# Patient Record
Sex: Female | Born: 1950
Health system: Southern US, Community
[De-identification: ages and names within clinical notes are randomized; demographics above are authoritative.]

## PROBLEM LIST (undated history)

## (undated) DIAGNOSIS — F329 Major depressive disorder, single episode, unspecified: Secondary | ICD-10-CM

## (undated) DIAGNOSIS — C541 Malignant neoplasm of endometrium: Secondary | ICD-10-CM

## (undated) DIAGNOSIS — E119 Type 2 diabetes mellitus without complications: Secondary | ICD-10-CM

## (undated) DIAGNOSIS — D126 Benign neoplasm of colon, unspecified: Secondary | ICD-10-CM

## (undated) DIAGNOSIS — L309 Dermatitis, unspecified: Secondary | ICD-10-CM

## (undated) DIAGNOSIS — E041 Nontoxic single thyroid nodule: Secondary | ICD-10-CM

## (undated) DIAGNOSIS — C649 Malignant neoplasm of unspecified kidney, except renal pelvis: Secondary | ICD-10-CM

## (undated) DIAGNOSIS — F32A Depression, unspecified: Secondary | ICD-10-CM

## (undated) DIAGNOSIS — E669 Obesity, unspecified: Secondary | ICD-10-CM

## (undated) DIAGNOSIS — K602 Anal fissure, unspecified: Secondary | ICD-10-CM

## (undated) DIAGNOSIS — M1990 Unspecified osteoarthritis, unspecified site: Secondary | ICD-10-CM

## (undated) DIAGNOSIS — K5790 Diverticulosis of intestine, part unspecified, without perforation or abscess without bleeding: Secondary | ICD-10-CM

## (undated) DIAGNOSIS — I10 Essential (primary) hypertension: Secondary | ICD-10-CM

## (undated) DIAGNOSIS — F419 Anxiety disorder, unspecified: Secondary | ICD-10-CM

## (undated) DIAGNOSIS — R739 Hyperglycemia, unspecified: Secondary | ICD-10-CM

## (undated) DIAGNOSIS — E079 Disorder of thyroid, unspecified: Secondary | ICD-10-CM

## (undated) DIAGNOSIS — N189 Chronic kidney disease, unspecified: Secondary | ICD-10-CM

## (undated) DIAGNOSIS — E039 Hypothyroidism, unspecified: Secondary | ICD-10-CM

## (undated) DIAGNOSIS — I499 Cardiac arrhythmia, unspecified: Secondary | ICD-10-CM

## (undated) DIAGNOSIS — L723 Sebaceous cyst: Secondary | ICD-10-CM

## (undated) HISTORY — DX: Major depressive disorder, single episode, unspecified: F32.9

## (undated) HISTORY — DX: Benign neoplasm of colon, unspecified: D12.6

## (undated) HISTORY — DX: Nontoxic single thyroid nodule: E04.1

## (undated) HISTORY — DX: Unspecified osteoarthritis, unspecified site: M19.90

## (undated) HISTORY — DX: Dermatitis, unspecified: L30.9

## (undated) HISTORY — DX: Sebaceous cyst: L72.3

## (undated) HISTORY — DX: Essential (primary) hypertension: I10

## (undated) HISTORY — DX: Disorder of thyroid, unspecified: E07.9

## (undated) HISTORY — DX: Anal fissure, unspecified: K60.2

## (undated) HISTORY — DX: Hyperglycemia, unspecified: R73.9

## (undated) HISTORY — DX: Depression, unspecified: F32.A

## (undated) HISTORY — PX: WISDOM TOOTH EXTRACTION: SHX21

## (undated) HISTORY — DX: Diverticulosis of intestine, part unspecified, without perforation or abscess without bleeding: K57.90

## (undated) HISTORY — DX: Malignant neoplasm of endometrium: C54.1

## (undated) HISTORY — DX: Obesity, unspecified: E66.9

---

## 1998-01-04 ENCOUNTER — Other Ambulatory Visit: Admission: RE | Admit: 1998-01-04 | Discharge: 1998-01-04 | Payer: Self-pay | Admitting: *Deleted

## 1998-04-24 ENCOUNTER — Other Ambulatory Visit: Admission: RE | Admit: 1998-04-24 | Discharge: 1998-04-24 | Payer: Self-pay | Admitting: *Deleted

## 1998-06-04 ENCOUNTER — Ambulatory Visit (HOSPITAL_COMMUNITY): Admission: RE | Admit: 1998-06-04 | Discharge: 1998-06-04 | Payer: Self-pay | Admitting: *Deleted

## 1998-06-18 ENCOUNTER — Other Ambulatory Visit: Admission: RE | Admit: 1998-06-18 | Discharge: 1998-06-18 | Payer: Self-pay | Admitting: *Deleted

## 1998-07-15 ENCOUNTER — Encounter (INDEPENDENT_AMBULATORY_CARE_PROVIDER_SITE_OTHER): Payer: Self-pay | Admitting: Specialist

## 1998-07-15 ENCOUNTER — Inpatient Hospital Stay (HOSPITAL_COMMUNITY): Admission: RE | Admit: 1998-07-15 | Discharge: 1998-07-18 | Payer: Self-pay | Admitting: *Deleted

## 1999-01-20 DIAGNOSIS — C541 Malignant neoplasm of endometrium: Secondary | ICD-10-CM

## 1999-01-20 HISTORY — DX: Malignant neoplasm of endometrium: C54.1

## 1999-01-20 HISTORY — PX: TOTAL ABDOMINAL HYSTERECTOMY: SHX209

## 1999-06-27 ENCOUNTER — Other Ambulatory Visit: Admission: RE | Admit: 1999-06-27 | Discharge: 1999-06-27 | Payer: Self-pay | Admitting: *Deleted

## 1999-10-17 ENCOUNTER — Encounter: Admission: RE | Admit: 1999-10-17 | Discharge: 1999-10-17 | Payer: Self-pay | Admitting: Internal Medicine

## 1999-10-17 ENCOUNTER — Encounter: Payer: Self-pay | Admitting: Internal Medicine

## 1999-11-11 ENCOUNTER — Other Ambulatory Visit: Admission: RE | Admit: 1999-11-11 | Discharge: 1999-11-11 | Payer: Self-pay | Admitting: *Deleted

## 2000-05-28 ENCOUNTER — Encounter (HOSPITAL_COMMUNITY): Admission: RE | Admit: 2000-05-28 | Discharge: 2000-06-27 | Payer: Self-pay | Admitting: Neurosurgery

## 2000-07-09 ENCOUNTER — Other Ambulatory Visit: Admission: RE | Admit: 2000-07-09 | Discharge: 2000-07-09 | Payer: Self-pay | Admitting: *Deleted

## 2001-04-22 ENCOUNTER — Other Ambulatory Visit: Admission: RE | Admit: 2001-04-22 | Discharge: 2001-04-22 | Payer: Self-pay | Admitting: *Deleted

## 2001-12-30 ENCOUNTER — Other Ambulatory Visit: Admission: RE | Admit: 2001-12-30 | Discharge: 2001-12-30 | Payer: Self-pay | Admitting: *Deleted

## 2002-03-03 ENCOUNTER — Encounter: Payer: Self-pay | Admitting: Internal Medicine

## 2002-03-03 ENCOUNTER — Encounter: Admission: RE | Admit: 2002-03-03 | Discharge: 2002-03-03 | Payer: Self-pay | Admitting: Internal Medicine

## 2002-03-17 ENCOUNTER — Ambulatory Visit (HOSPITAL_COMMUNITY): Admission: RE | Admit: 2002-03-17 | Discharge: 2002-03-17 | Payer: Self-pay | Admitting: *Deleted

## 2002-03-17 ENCOUNTER — Encounter (INDEPENDENT_AMBULATORY_CARE_PROVIDER_SITE_OTHER): Payer: Self-pay | Admitting: Specialist

## 2002-04-26 ENCOUNTER — Emergency Department (HOSPITAL_COMMUNITY): Admission: EM | Admit: 2002-04-26 | Discharge: 2002-04-26 | Payer: Self-pay | Admitting: *Deleted

## 2002-10-05 ENCOUNTER — Encounter: Payer: Self-pay | Admitting: Internal Medicine

## 2002-10-05 ENCOUNTER — Encounter: Admission: RE | Admit: 2002-10-05 | Discharge: 2002-10-05 | Payer: Self-pay | Admitting: Internal Medicine

## 2004-02-20 DIAGNOSIS — D126 Benign neoplasm of colon, unspecified: Secondary | ICD-10-CM

## 2004-02-20 HISTORY — DX: Benign neoplasm of colon, unspecified: D12.6

## 2004-11-21 ENCOUNTER — Encounter: Admission: RE | Admit: 2004-11-21 | Discharge: 2004-11-21 | Payer: Self-pay | Admitting: Internal Medicine

## 2004-12-05 ENCOUNTER — Encounter: Admission: RE | Admit: 2004-12-05 | Discharge: 2004-12-05 | Payer: Self-pay | Admitting: Internal Medicine

## 2005-02-02 ENCOUNTER — Ambulatory Visit (HOSPITAL_COMMUNITY): Admission: RE | Admit: 2005-02-02 | Discharge: 2005-02-02 | Payer: Self-pay

## 2005-02-13 ENCOUNTER — Encounter (HOSPITAL_COMMUNITY): Admission: RE | Admit: 2005-02-13 | Discharge: 2005-03-15 | Payer: Self-pay | Admitting: Internal Medicine

## 2006-01-19 HISTORY — PX: APPENDECTOMY: SHX54

## 2006-01-19 HISTORY — PX: CHOLECYSTECTOMY: SHX55

## 2006-03-09 ENCOUNTER — Ambulatory Visit: Payer: Self-pay | Admitting: Family Medicine

## 2006-03-09 DIAGNOSIS — F3289 Other specified depressive episodes: Secondary | ICD-10-CM | POA: Insufficient documentation

## 2006-03-09 DIAGNOSIS — J309 Allergic rhinitis, unspecified: Secondary | ICD-10-CM | POA: Insufficient documentation

## 2006-03-09 DIAGNOSIS — F329 Major depressive disorder, single episode, unspecified: Secondary | ICD-10-CM | POA: Insufficient documentation

## 2006-03-09 DIAGNOSIS — J45909 Unspecified asthma, uncomplicated: Secondary | ICD-10-CM | POA: Insufficient documentation

## 2006-03-09 DIAGNOSIS — R030 Elevated blood-pressure reading, without diagnosis of hypertension: Secondary | ICD-10-CM | POA: Insufficient documentation

## 2006-03-09 DIAGNOSIS — E669 Obesity, unspecified: Secondary | ICD-10-CM | POA: Insufficient documentation

## 2006-03-09 DIAGNOSIS — K219 Gastro-esophageal reflux disease without esophagitis: Secondary | ICD-10-CM | POA: Insufficient documentation

## 2006-03-15 ENCOUNTER — Telehealth (INDEPENDENT_AMBULATORY_CARE_PROVIDER_SITE_OTHER): Payer: Self-pay | Admitting: Family Medicine

## 2006-04-06 ENCOUNTER — Encounter (INDEPENDENT_AMBULATORY_CARE_PROVIDER_SITE_OTHER): Payer: Self-pay | Admitting: Family Medicine

## 2006-04-12 ENCOUNTER — Ambulatory Visit: Payer: Self-pay | Admitting: Family Medicine

## 2006-04-12 LAB — CONVERTED CEMR LAB
ALT: 30 units/L (ref 0–35)
Albumin: 4.3 g/dL (ref 3.5–5.2)
BUN: 14 mg/dL (ref 6–23)
Basophils Absolute: 0 10*3/uL (ref 0.0–0.1)
Calcium: 9.4 mg/dL (ref 8.4–10.5)
Cholesterol: 101 mg/dL (ref 0–200)
Eosinophils Relative: 5 % (ref 0–5)
HCT: 44.8 % (ref 36.0–46.0)
Hemoglobin: 15.3 g/dL — ABNORMAL HIGH (ref 12.0–15.0)
Hgb A1c MFr Bld: 5.7 %
Lymphocytes Relative: 28 % (ref 12–46)
Lymphs Abs: 1.5 10*3/uL (ref 0.7–3.3)
MCHC: 34.2 g/dL (ref 30.0–36.0)
Monocytes Absolute: 0.5 10*3/uL (ref 0.2–0.7)
Monocytes Relative: 9 % (ref 3–11)
Platelets: 224 10*3/uL (ref 150–400)
Total CHOL/HDL Ratio: 2.4
Total Protein: 7.1 g/dL (ref 6.0–8.3)
WBC: 5.5 10*3/uL (ref 4.0–10.5)

## 2006-04-22 ENCOUNTER — Telehealth (INDEPENDENT_AMBULATORY_CARE_PROVIDER_SITE_OTHER): Payer: Self-pay | Admitting: Family Medicine

## 2006-05-11 ENCOUNTER — Ambulatory Visit: Payer: Self-pay | Admitting: Family Medicine

## 2006-05-13 ENCOUNTER — Encounter (INDEPENDENT_AMBULATORY_CARE_PROVIDER_SITE_OTHER): Payer: Self-pay | Admitting: Family Medicine

## 2006-05-24 ENCOUNTER — Telehealth (INDEPENDENT_AMBULATORY_CARE_PROVIDER_SITE_OTHER): Payer: Self-pay | Admitting: Family Medicine

## 2006-06-03 ENCOUNTER — Ambulatory Visit: Payer: Self-pay | Admitting: Family Medicine

## 2006-06-03 DIAGNOSIS — K5730 Diverticulosis of large intestine without perforation or abscess without bleeding: Secondary | ICD-10-CM | POA: Insufficient documentation

## 2006-06-03 LAB — CONVERTED CEMR LAB
Glucose, Urine, Semiquant: NEGATIVE
Nitrite: NEGATIVE
Specific Gravity, Urine: 1.02

## 2006-06-04 LAB — CONVERTED CEMR LAB
Basophils Absolute: 0 10*3/uL (ref 0.0–0.1)
Hemoglobin: 14.2 g/dL (ref 12.0–15.0)
Lymphocytes Relative: 28 % (ref 12–46)
Monocytes Absolute: 0.5 10*3/uL (ref 0.2–0.7)
Neutro Abs: 3.2 10*3/uL (ref 1.7–7.7)
Neutrophils Relative %: 58 % (ref 43–77)
Platelets: 215 10*3/uL (ref 150–400)
RDW: 13.7 % (ref 11.5–14.0)

## 2006-06-08 ENCOUNTER — Ambulatory Visit: Payer: Self-pay | Admitting: Family Medicine

## 2006-06-08 LAB — CONVERTED CEMR LAB
Glucose, Bld: 151 mg/dL
Hgb A1c MFr Bld: 5.5 %

## 2006-06-09 ENCOUNTER — Encounter (INDEPENDENT_AMBULATORY_CARE_PROVIDER_SITE_OTHER): Payer: Self-pay | Admitting: Family Medicine

## 2006-06-10 ENCOUNTER — Telehealth (INDEPENDENT_AMBULATORY_CARE_PROVIDER_SITE_OTHER): Payer: Self-pay | Admitting: Family Medicine

## 2006-06-18 ENCOUNTER — Telehealth (INDEPENDENT_AMBULATORY_CARE_PROVIDER_SITE_OTHER): Payer: Self-pay | Admitting: Family Medicine

## 2006-07-05 ENCOUNTER — Telehealth (INDEPENDENT_AMBULATORY_CARE_PROVIDER_SITE_OTHER): Payer: Self-pay | Admitting: Family Medicine

## 2006-07-12 ENCOUNTER — Telehealth (INDEPENDENT_AMBULATORY_CARE_PROVIDER_SITE_OTHER): Payer: Self-pay | Admitting: *Deleted

## 2006-07-13 ENCOUNTER — Ambulatory Visit: Payer: Self-pay | Admitting: Family Medicine

## 2006-07-15 ENCOUNTER — Ambulatory Visit (HOSPITAL_COMMUNITY): Admission: RE | Admit: 2006-07-15 | Discharge: 2006-07-15 | Payer: Self-pay | Admitting: Family Medicine

## 2006-07-15 ENCOUNTER — Telehealth (INDEPENDENT_AMBULATORY_CARE_PROVIDER_SITE_OTHER): Payer: Self-pay | Admitting: Family Medicine

## 2006-07-16 ENCOUNTER — Telehealth (INDEPENDENT_AMBULATORY_CARE_PROVIDER_SITE_OTHER): Payer: Self-pay | Admitting: *Deleted

## 2006-07-19 ENCOUNTER — Encounter (INDEPENDENT_AMBULATORY_CARE_PROVIDER_SITE_OTHER): Payer: Self-pay | Admitting: Family Medicine

## 2006-07-22 ENCOUNTER — Telehealth (INDEPENDENT_AMBULATORY_CARE_PROVIDER_SITE_OTHER): Payer: Self-pay | Admitting: Family Medicine

## 2006-07-22 ENCOUNTER — Ambulatory Visit (HOSPITAL_COMMUNITY): Admission: RE | Admit: 2006-07-22 | Discharge: 2006-07-22 | Payer: Self-pay | Admitting: Surgery

## 2006-07-22 ENCOUNTER — Encounter (INDEPENDENT_AMBULATORY_CARE_PROVIDER_SITE_OTHER): Payer: Self-pay | Admitting: Family Medicine

## 2006-07-26 ENCOUNTER — Encounter (INDEPENDENT_AMBULATORY_CARE_PROVIDER_SITE_OTHER): Payer: Self-pay | Admitting: Family Medicine

## 2006-07-28 ENCOUNTER — Encounter (INDEPENDENT_AMBULATORY_CARE_PROVIDER_SITE_OTHER): Payer: Self-pay | Admitting: Interventional Radiology

## 2006-07-28 ENCOUNTER — Encounter: Admission: RE | Admit: 2006-07-28 | Discharge: 2006-07-28 | Payer: Self-pay | Admitting: Surgery

## 2006-07-28 ENCOUNTER — Other Ambulatory Visit: Admission: RE | Admit: 2006-07-28 | Discharge: 2006-07-28 | Payer: Self-pay | Admitting: Interventional Radiology

## 2006-08-09 ENCOUNTER — Encounter (INDEPENDENT_AMBULATORY_CARE_PROVIDER_SITE_OTHER): Payer: Self-pay | Admitting: Family Medicine

## 2006-08-20 ENCOUNTER — Inpatient Hospital Stay (HOSPITAL_COMMUNITY): Admission: RE | Admit: 2006-08-20 | Discharge: 2006-08-23 | Payer: Self-pay | Admitting: Surgery

## 2006-08-20 ENCOUNTER — Encounter (INDEPENDENT_AMBULATORY_CARE_PROVIDER_SITE_OTHER): Payer: Self-pay | Admitting: Surgery

## 2006-08-30 ENCOUNTER — Ambulatory Visit (HOSPITAL_COMMUNITY): Payer: Self-pay | Admitting: Oncology

## 2006-08-31 ENCOUNTER — Encounter (INDEPENDENT_AMBULATORY_CARE_PROVIDER_SITE_OTHER): Payer: Self-pay | Admitting: Family Medicine

## 2006-09-28 ENCOUNTER — Ambulatory Visit: Admission: RE | Admit: 2006-09-28 | Discharge: 2006-09-28 | Payer: Self-pay | Admitting: Gynecology

## 2006-10-01 ENCOUNTER — Encounter (INDEPENDENT_AMBULATORY_CARE_PROVIDER_SITE_OTHER): Payer: Self-pay | Admitting: Family Medicine

## 2006-10-05 ENCOUNTER — Ambulatory Visit: Admission: RE | Admit: 2006-10-05 | Discharge: 2006-11-30 | Payer: Self-pay | Admitting: Radiation Oncology

## 2006-10-06 ENCOUNTER — Other Ambulatory Visit: Admission: RE | Admit: 2006-10-06 | Discharge: 2006-10-06 | Payer: Self-pay | Admitting: Radiation Oncology

## 2006-10-15 ENCOUNTER — Encounter (INDEPENDENT_AMBULATORY_CARE_PROVIDER_SITE_OTHER): Payer: Self-pay | Admitting: Family Medicine

## 2006-10-28 ENCOUNTER — Encounter (INDEPENDENT_AMBULATORY_CARE_PROVIDER_SITE_OTHER): Payer: Self-pay | Admitting: Family Medicine

## 2006-11-24 ENCOUNTER — Encounter (INDEPENDENT_AMBULATORY_CARE_PROVIDER_SITE_OTHER): Payer: Self-pay | Admitting: Family Medicine

## 2006-11-30 ENCOUNTER — Encounter: Admission: RE | Admit: 2006-11-30 | Discharge: 2006-11-30 | Payer: Self-pay | Admitting: Family Medicine

## 2006-12-01 ENCOUNTER — Telehealth (INDEPENDENT_AMBULATORY_CARE_PROVIDER_SITE_OTHER): Payer: Self-pay | Admitting: Family Medicine

## 2006-12-21 ENCOUNTER — Encounter (INDEPENDENT_AMBULATORY_CARE_PROVIDER_SITE_OTHER): Payer: Self-pay | Admitting: Family Medicine

## 2006-12-24 ENCOUNTER — Encounter (INDEPENDENT_AMBULATORY_CARE_PROVIDER_SITE_OTHER): Payer: Self-pay | Admitting: Family Medicine

## 2006-12-27 ENCOUNTER — Ambulatory Visit: Payer: Self-pay | Admitting: Family Medicine

## 2006-12-27 DIAGNOSIS — E041 Nontoxic single thyroid nodule: Secondary | ICD-10-CM | POA: Insufficient documentation

## 2006-12-27 DIAGNOSIS — C55 Malignant neoplasm of uterus, part unspecified: Secondary | ICD-10-CM | POA: Insufficient documentation

## 2007-01-24 ENCOUNTER — Ambulatory Visit: Payer: Self-pay | Admitting: Family Medicine

## 2007-02-02 ENCOUNTER — Telehealth (INDEPENDENT_AMBULATORY_CARE_PROVIDER_SITE_OTHER): Payer: Self-pay | Admitting: *Deleted

## 2007-03-18 ENCOUNTER — Ambulatory Visit: Admission: RE | Admit: 2007-03-18 | Discharge: 2007-03-18 | Payer: Self-pay | Admitting: Gynecology

## 2007-03-28 ENCOUNTER — Ambulatory Visit (HOSPITAL_COMMUNITY): Admission: RE | Admit: 2007-03-28 | Discharge: 2007-03-28 | Payer: Self-pay | Admitting: Gynecology

## 2007-03-28 ENCOUNTER — Encounter (INDEPENDENT_AMBULATORY_CARE_PROVIDER_SITE_OTHER): Payer: Self-pay | Admitting: Family Medicine

## 2007-04-07 ENCOUNTER — Emergency Department (HOSPITAL_COMMUNITY): Admission: EM | Admit: 2007-04-07 | Discharge: 2007-04-07 | Payer: Self-pay | Admitting: Emergency Medicine

## 2007-04-22 ENCOUNTER — Telehealth (INDEPENDENT_AMBULATORY_CARE_PROVIDER_SITE_OTHER): Payer: Self-pay | Admitting: *Deleted

## 2007-04-22 ENCOUNTER — Ambulatory Visit: Payer: Self-pay | Admitting: Family Medicine

## 2007-04-25 LAB — CONVERTED CEMR LAB
Albumin: 3.9 g/dL (ref 3.5–5.2)
BUN: 11 mg/dL (ref 6–23)
Calcium: 9.2 mg/dL (ref 8.4–10.5)
Chloride: 109 meq/L (ref 96–112)
Glucose, Bld: 132 mg/dL — ABNORMAL HIGH (ref 70–99)
Lymphocytes Relative: 15 % (ref 12–46)
Lymphs Abs: 0.6 10*3/uL — ABNORMAL LOW (ref 0.7–4.0)
Monocytes Relative: 10 % (ref 3–12)
Neutro Abs: 3 10*3/uL (ref 1.7–7.7)
Neutrophils Relative %: 71 % (ref 43–77)
Potassium: 3.9 meq/L (ref 3.5–5.3)
RBC: 4.62 M/uL (ref 3.87–5.11)
Sodium: 142 meq/L (ref 135–145)
Total Protein: 6.6 g/dL (ref 6.0–8.3)
WBC: 4.2 10*3/uL (ref 4.0–10.5)

## 2007-05-09 ENCOUNTER — Ambulatory Visit: Payer: Self-pay | Admitting: Family Medicine

## 2007-05-31 ENCOUNTER — Encounter (INDEPENDENT_AMBULATORY_CARE_PROVIDER_SITE_OTHER): Payer: Self-pay | Admitting: Family Medicine

## 2007-06-01 ENCOUNTER — Encounter (INDEPENDENT_AMBULATORY_CARE_PROVIDER_SITE_OTHER): Payer: Self-pay | Admitting: Family Medicine

## 2007-06-06 ENCOUNTER — Ambulatory Visit: Payer: Self-pay | Admitting: Family Medicine

## 2007-06-06 LAB — CONVERTED CEMR LAB
Cholesterol, target level: 200 mg/dL
LDL Goal: 160 mg/dL

## 2007-06-14 ENCOUNTER — Encounter: Admission: RE | Admit: 2007-06-14 | Discharge: 2007-06-14 | Payer: Self-pay | Admitting: Surgery

## 2007-06-24 ENCOUNTER — Encounter (INDEPENDENT_AMBULATORY_CARE_PROVIDER_SITE_OTHER): Payer: Self-pay | Admitting: Family Medicine

## 2007-06-29 ENCOUNTER — Encounter (INDEPENDENT_AMBULATORY_CARE_PROVIDER_SITE_OTHER): Payer: Self-pay | Admitting: Family Medicine

## 2007-07-07 ENCOUNTER — Encounter: Admission: RE | Admit: 2007-07-07 | Discharge: 2007-07-07 | Payer: Self-pay | Admitting: Surgery

## 2007-07-07 ENCOUNTER — Encounter (INDEPENDENT_AMBULATORY_CARE_PROVIDER_SITE_OTHER): Payer: Self-pay | Admitting: Interventional Radiology

## 2007-07-07 ENCOUNTER — Other Ambulatory Visit: Admission: RE | Admit: 2007-07-07 | Discharge: 2007-07-07 | Payer: Self-pay | Admitting: Interventional Radiology

## 2007-08-16 ENCOUNTER — Ambulatory Visit: Payer: Self-pay | Admitting: Family Medicine

## 2007-08-23 ENCOUNTER — Telehealth (INDEPENDENT_AMBULATORY_CARE_PROVIDER_SITE_OTHER): Payer: Self-pay | Admitting: *Deleted

## 2007-09-02 ENCOUNTER — Telehealth (INDEPENDENT_AMBULATORY_CARE_PROVIDER_SITE_OTHER): Payer: Self-pay | Admitting: Family Medicine

## 2007-09-16 ENCOUNTER — Encounter: Payer: Self-pay | Admitting: Gynecology

## 2007-09-16 ENCOUNTER — Ambulatory Visit: Admission: RE | Admit: 2007-09-16 | Discharge: 2007-09-16 | Payer: Self-pay | Admitting: Gynecology

## 2007-09-16 ENCOUNTER — Other Ambulatory Visit: Admission: RE | Admit: 2007-09-16 | Discharge: 2007-09-16 | Payer: Self-pay | Admitting: Gynecology

## 2007-09-21 ENCOUNTER — Encounter (INDEPENDENT_AMBULATORY_CARE_PROVIDER_SITE_OTHER): Payer: Self-pay | Admitting: Family Medicine

## 2007-10-04 ENCOUNTER — Telehealth (INDEPENDENT_AMBULATORY_CARE_PROVIDER_SITE_OTHER): Payer: Self-pay | Admitting: Family Medicine

## 2007-10-05 ENCOUNTER — Ambulatory Visit: Payer: Self-pay | Admitting: Internal Medicine

## 2007-10-06 ENCOUNTER — Telehealth (INDEPENDENT_AMBULATORY_CARE_PROVIDER_SITE_OTHER): Payer: Self-pay | Admitting: Internal Medicine

## 2007-10-13 ENCOUNTER — Ambulatory Visit (HOSPITAL_COMMUNITY): Admission: RE | Admit: 2007-10-13 | Discharge: 2007-10-13 | Payer: Self-pay | Admitting: Family Medicine

## 2007-10-13 ENCOUNTER — Ambulatory Visit: Payer: Self-pay | Admitting: Family Medicine

## 2007-10-26 ENCOUNTER — Telehealth (INDEPENDENT_AMBULATORY_CARE_PROVIDER_SITE_OTHER): Payer: Self-pay | Admitting: Family Medicine

## 2007-11-02 ENCOUNTER — Ambulatory Visit: Payer: Self-pay | Admitting: Family Medicine

## 2007-11-22 ENCOUNTER — Telehealth (INDEPENDENT_AMBULATORY_CARE_PROVIDER_SITE_OTHER): Payer: Self-pay | Admitting: Family Medicine

## 2007-12-12 ENCOUNTER — Ambulatory Visit: Payer: Self-pay | Admitting: Family Medicine

## 2007-12-20 ENCOUNTER — Encounter (INDEPENDENT_AMBULATORY_CARE_PROVIDER_SITE_OTHER): Payer: Self-pay | Admitting: Family Medicine

## 2007-12-24 ENCOUNTER — Encounter (INDEPENDENT_AMBULATORY_CARE_PROVIDER_SITE_OTHER): Payer: Self-pay | Admitting: Family Medicine

## 2007-12-28 LAB — CONVERTED CEMR LAB: Glucose, 2 hour: 163 mg/dL — ABNORMAL HIGH (ref 70–139)

## 2008-01-09 ENCOUNTER — Ambulatory Visit: Payer: Self-pay | Admitting: Family Medicine

## 2008-01-09 DIAGNOSIS — E739 Lactose intolerance, unspecified: Secondary | ICD-10-CM | POA: Insufficient documentation

## 2008-01-09 LAB — CONVERTED CEMR LAB: Hgb A1c MFr Bld: 5.8 %

## 2008-03-07 ENCOUNTER — Ambulatory Visit: Admission: RE | Admit: 2008-03-07 | Discharge: 2008-03-07 | Payer: Self-pay | Admitting: Gynecology

## 2008-03-07 ENCOUNTER — Encounter: Admission: RE | Admit: 2008-03-07 | Discharge: 2008-03-07 | Payer: Self-pay | Admitting: Endocrinology

## 2008-03-07 ENCOUNTER — Encounter: Payer: Self-pay | Admitting: Gynecology

## 2008-03-07 ENCOUNTER — Other Ambulatory Visit: Admission: RE | Admit: 2008-03-07 | Discharge: 2008-03-07 | Payer: Self-pay | Admitting: Gynecology

## 2008-03-19 ENCOUNTER — Encounter (INDEPENDENT_AMBULATORY_CARE_PROVIDER_SITE_OTHER): Payer: Self-pay | Admitting: Family Medicine

## 2008-03-21 ENCOUNTER — Ambulatory Visit: Payer: Self-pay | Admitting: Family Medicine

## 2008-03-21 LAB — CONVERTED CEMR LAB
ALT: 18 units/L (ref 0–35)
AST: 14 units/L (ref 0–37)
Albumin: 4.3 g/dL (ref 3.5–5.2)
Alkaline Phosphatase: 72 units/L (ref 39–117)
BUN: 12 mg/dL (ref 6–23)
Band Neutrophils: 0 % (ref 0–10)
Basophils Absolute: 0 10*3/uL (ref 0.0–0.1)
Calcium: 9 mg/dL (ref 8.4–10.5)
Chloride: 109 meq/L (ref 96–112)
Eosinophils Relative: 5 % (ref 0–5)
LDL Cholesterol: 54 mg/dL (ref 0–99)
Lymphocytes Relative: 22 % (ref 12–46)
Lymphs Abs: 1 10*3/uL (ref 0.7–4.0)
MCV: 91.1 fL (ref 78.0–100.0)
Neutro Abs: 2.8 10*3/uL (ref 1.7–7.7)
Platelets: 202 10*3/uL (ref 150–400)
Potassium: 3.9 meq/L (ref 3.5–5.3)
RBC: 4.59 M/uL (ref 3.87–5.11)
Sodium: 144 meq/L (ref 135–145)
Total Protein: 6.8 g/dL (ref 6.0–8.3)
WBC: 4.4 10*3/uL (ref 4.0–10.5)

## 2008-03-22 LAB — CONVERTED CEMR LAB: TSH: 1.856 microintl units/mL (ref 0.350–4.50)

## 2008-06-15 ENCOUNTER — Ambulatory Visit: Payer: Self-pay | Admitting: Family Medicine

## 2008-06-15 DIAGNOSIS — L680 Hirsutism: Secondary | ICD-10-CM | POA: Insufficient documentation

## 2008-06-20 ENCOUNTER — Ambulatory Visit: Payer: Self-pay | Admitting: Family Medicine

## 2008-06-20 LAB — CONVERTED CEMR LAB
Amylase: 66 units/L (ref 27–131)
BUN: 7 mg/dL (ref 6–23)
CO2: 25 meq/L (ref 19–32)
Calcium: 9.2 mg/dL (ref 8.4–10.5)
Chloride: 108 meq/L (ref 96–112)
Creatinine, Ser: 0.97 mg/dL (ref 0.40–1.20)
Eosinophils Absolute: 0.2 10*3/uL (ref 0.0–0.7)
Eosinophils Relative: 4 % (ref 0–5)
Hemoglobin: 14.6 g/dL (ref 12.0–15.0)
Lymphocytes Relative: 19 % (ref 12–46)
Lymphs Abs: 0.9 10*3/uL (ref 0.7–4.0)
MCHC: 35.6 g/dL (ref 30.0–36.0)
Monocytes Absolute: 0.5 10*3/uL (ref 0.1–1.0)
Monocytes Relative: 10 % (ref 3–12)
Neutro Abs: 3 10*3/uL (ref 1.7–7.7)
Neutrophils Relative %: 67 % (ref 43–77)
Platelets: 171 10*3/uL (ref 150–400)
RDW: 13.7 % (ref 11.5–15.5)

## 2008-06-21 ENCOUNTER — Telehealth (INDEPENDENT_AMBULATORY_CARE_PROVIDER_SITE_OTHER): Payer: Self-pay | Admitting: Family Medicine

## 2008-06-22 ENCOUNTER — Encounter: Admission: RE | Admit: 2008-06-22 | Discharge: 2008-06-22 | Payer: Self-pay | Admitting: Family Medicine

## 2008-06-26 ENCOUNTER — Ambulatory Visit: Admission: RE | Admit: 2008-06-26 | Discharge: 2008-06-26 | Payer: Self-pay | Admitting: Radiation Oncology

## 2008-06-27 ENCOUNTER — Encounter (INDEPENDENT_AMBULATORY_CARE_PROVIDER_SITE_OTHER): Payer: Self-pay | Admitting: Family Medicine

## 2008-06-29 ENCOUNTER — Telehealth (INDEPENDENT_AMBULATORY_CARE_PROVIDER_SITE_OTHER): Payer: Self-pay | Admitting: *Deleted

## 2008-07-09 ENCOUNTER — Telehealth (INDEPENDENT_AMBULATORY_CARE_PROVIDER_SITE_OTHER): Payer: Self-pay | Admitting: *Deleted

## 2008-07-10 ENCOUNTER — Encounter (INDEPENDENT_AMBULATORY_CARE_PROVIDER_SITE_OTHER): Payer: Self-pay | Admitting: Family Medicine

## 2008-09-14 ENCOUNTER — Ambulatory Visit: Payer: Self-pay | Admitting: Family Medicine

## 2008-09-14 DIAGNOSIS — M549 Dorsalgia, unspecified: Secondary | ICD-10-CM | POA: Insufficient documentation

## 2008-10-12 ENCOUNTER — Other Ambulatory Visit: Admission: RE | Admit: 2008-10-12 | Discharge: 2008-10-12 | Payer: Self-pay | Admitting: Neurology

## 2008-10-12 ENCOUNTER — Ambulatory Visit: Admission: RE | Admit: 2008-10-12 | Discharge: 2008-10-12 | Payer: Self-pay | Admitting: Gynecology

## 2008-10-12 ENCOUNTER — Encounter: Payer: Self-pay | Admitting: Gynecology

## 2008-12-19 ENCOUNTER — Ambulatory Visit (HOSPITAL_COMMUNITY): Admission: RE | Admit: 2008-12-19 | Discharge: 2008-12-19 | Payer: Self-pay | Admitting: Internal Medicine

## 2008-12-26 ENCOUNTER — Encounter: Admission: RE | Admit: 2008-12-26 | Discharge: 2008-12-26 | Payer: Self-pay | Admitting: Endocrinology

## 2009-03-14 ENCOUNTER — Ambulatory Visit (HOSPITAL_COMMUNITY): Admission: RE | Admit: 2009-03-14 | Discharge: 2009-03-14 | Payer: Self-pay | Admitting: Family Medicine

## 2009-06-27 ENCOUNTER — Encounter: Admission: RE | Admit: 2009-06-27 | Discharge: 2009-06-27 | Payer: Self-pay | Admitting: Internal Medicine

## 2009-08-22 ENCOUNTER — Ambulatory Visit: Admission: RE | Admit: 2009-08-22 | Discharge: 2009-08-22 | Payer: Self-pay | Admitting: Gynecologic Oncology

## 2009-08-22 ENCOUNTER — Other Ambulatory Visit: Admission: RE | Admit: 2009-08-22 | Discharge: 2009-08-22 | Payer: Self-pay | Admitting: Gynecologic Oncology

## 2010-01-03 ENCOUNTER — Ambulatory Visit (HOSPITAL_COMMUNITY)
Admission: RE | Admit: 2010-01-03 | Discharge: 2010-01-03 | Payer: Self-pay | Source: Home / Self Care | Attending: Family Medicine | Admitting: Family Medicine

## 2010-02-09 ENCOUNTER — Encounter: Payer: Self-pay | Admitting: Preventative Medicine

## 2010-02-09 ENCOUNTER — Encounter: Payer: Self-pay | Admitting: Family Medicine

## 2010-02-09 ENCOUNTER — Encounter: Payer: Self-pay | Admitting: Endocrinology

## 2010-02-18 NOTE — Assessment & Plan Note (Signed)
Summary: follow up 2 month/slj   Vital Signs:  Patient Profile:   60 Years Old Female Height:     65 inches Weight:      248 pounds BMI:     41.42 O2 Sat:      97 % Temp:     97.3 degrees F Pulse rate:   77 / minute Resp:     14 per minute BP sitting:   136 / 88  Vitals Entered By: Sherilyn Banker (May 09, 2007 8:20 AM)                 PCP:  Franchot Heidelberg, MD  Chief Complaint:  follow up visit.  History of Present Illness: Pt in for recheck.  She was seen a few weeks ago due to fatigue and depression. Her Cymbalta was stopped and she was placed on Effexor.Labs were done as part of evl and results are reviewed:  1. CBC-  normal 2. CMP - BS 132 with CO2 mildy low at 15 3. TSH - normal  She refers to herself as the grouch this morning. She states since changing to the Effexor she has felt a little more energized. She states she is sleeping good. Energy is improving and she states 3 points up from last eval and now at 6/10. She states her concentrations is better with the Effexor. She is still irritable but adds this is part of who she is. She denies any side-effects except for prhaps a little dry mouth. She denies suicidal ideation with this. She still sees councellor in [place] and notes next appt in 10 days.   She states she thinks her dry mouth is related to her being pre-diabetic. Denies poluria and polyphagia but adds always thirsty. She is also attributing sxto allergies and notes using Zyrtec. She has stuffy nose, clear liquid congestion, with PND. Woke up this morning with dry mouth. She is sneezing and she has had watery eyes.   She now presents    Prior Medications Reviewed Using: Patient Recall  Updated Prior Medication List: EFFEXOR XR 150 MG  CP24 (VENLAFAXINE HCL) One daily ZYRTEC 10 MG TABS (CETIRIZINE HCL) once daily FISH OIL 1000 MG CAPS (OMEGA-3 FATTY ACIDS) 2-3 capsules VITAMIN C 100 MG TABS (ASCORBIC ACID) qd PROTONIX  TBEC (PANTOPRAZOLE SODIUM  TBEC) once daily MEGACE ORAL 40 MG/ML  SUSP (MEGESTROL ACETATE) 20 mg three times a day  Current Allergies (reviewed today): ! * TRIPLE ANTIBIOTIC CREAM LEVAQUIN  Past Medical History:    Reviewed history from 03/09/2006 and no changes required:       Allergic rhinitis       Asthma       Depression       GERD  Past Surgical History:    Reviewed history from 03/09/2006 and no changes required:       TAH 2001   Family History:    Reviewed history from 03/09/2006 and no changes required:       father- age 51       mother-deceased       1 sister- obesity, knee replacement  Social History:    Reviewed history from 03/09/2006 and no changes required:       Single       lives with father       Former Smoker       Alcohol use-yes       Drug use-no   Risk Factors: Tobacco use:  quit    Year quit:  1998    Pack-years:  1/20 years Drug use:  no Alcohol use:  yes    Drinks per day:  3  Family History Risk Factors:    Family History of MI in females < 55 years old:  no    Family History of MI in males < 43 years old:  no  Mammogram History:    Date of Last Mammogram:  11/30/2006   Review of Systems      See HPI  General      Denies chills, fever, and sweats.  Resp      Denies cough, shortness of breath, sputum productive, and wheezing.  GI      Denies abdominal pain, constipation, diarrhea, nausea, and vomiting.      Off protonix - heartburn better.   GU      Denies nocturia, urinary frequency, and urinary hesitancy.   Physical Exam  General:     Well-developed,well-nourished,in no acute distress; alert,appropriate and cooperative throughout examination. Sniffles.  Head:     Normocephalic and atraumatic without obvious abnormalities.  Eyes:     Mild injection Ears:     External ear exam shows no significant lesions or deformities.  Otoscopic examination reveals clear canals, tympanic membranes are intact bilaterally without bulging, retraction,  inflammation or discharge. Hearing is grossly normal bilaterally. Nose:     MOd congestion with clear liquid both nares. Mouth:     Oral mucosa and oropharynx without lesions or exudates.  Neck:     No deformities, masses, or tenderness noted. Lungs:     Normal respiratory effort, chest expands symmetrically. Lungs are clear to auscultation, no crackles or wheezes. Heart:     Normal rate and regular rhythm. S1 and S2 normal without gallop, murmur, click, rub or other extra sounds. Psych:     Flat affect. Quite. Alert and ortineted. Not withdrawn. Blames self for not being tolerant of people.    Impression & Recommendations:  Problem # 1:  DEPRESSION (ICD-311) Improved pr patient response. Councelled stress coiping skills, exersize. Recheck 4 weeks. Cont seeing councellor as scheduled. Her updated medication list for this problem includes:    Effexor Xr 150 Mg Cp24 (Venlafaxine hcl) ..... One daily   Problem # 2:  ALLERGIC RHINITIS (ICD-477.9) Cont Zyrtec. Add Omnarsi nasal spray as topical Rx. Single dose Decadron/Depomedrol to decongest. Recheck if worse. Advised risk and benefit of Rx. Agrees. Suspect cause for dry mouth based on nasal breathing. If persist, consider sleep study etc. Her updated medication list for this problem includes:    Zyrtec 10 Mg Tabs (Cetirizine hcl) ..... Once daily    Omnaris 50 Mcg/act Susp (Ciclesonide) ..... One squirt per nostril daily  Orders: Depo- Medrol 80mg  (J1040) Admin of Therapeutic Inj  intramuscular or subcutaneous (77824) Decadron 1mg  (M3536)   Problem # 3:  THYROID NODULE (ICD-241.0) See Surgery as scheduled. Reassured normal TSH.  Problem # 4:  GERD (ICD-530.81) Off Protonix. Councelled diet, exersize and dietary modification. Update if worsen and resume PPI. The following medications were removed from the medication list:    Protonix Tbec (Pantoprazole sodium tbec) ..... Once daily   Complete Medication List: 1)  Effexor Xr  150 Mg Cp24 (Venlafaxine hcl) .... One daily 2)  Zyrtec 10 Mg Tabs (Cetirizine hcl) .... Once daily 3)  Fish Oil 1000 Mg Caps (Omega-3 fatty acids) .... 2-3 capsules 4)  Vitamin C 100 Mg Tabs (Ascorbic acid) .... Qd 5)  Megace Oral 40 Mg/ml Susp (Megestrol acetate) .Marland KitchenMarland KitchenMarland Kitchen  20 mg three times a day 6)  Omnaris 50 Mcg/act Susp (Ciclesonide) .... One squirt per nostril daily   Patient Instructions: 1)  Please schedule a follow-up appointment in 1 month.    Prescriptions: OMNARIS 50 MCG/ACT  SUSP (CICLESONIDE) One squirt per nostril daily  #1 x 0   Entered and Authorized by:   Franchot Heidelberg MD   Signed by:   Franchot Heidelberg MD on 05/09/2007   Method used:   Samples Given   RxID:   Frances.Greek  ]  Medication Administration  Injection # 1:    Medication: Decadron 1mg     Diagnosis: ALLERGIC RHINITIS (ICD-477.9)    Route: IM    Site: L thigh    Exp Date: 05/19/2008    Lot #: [id number]    Mfr: American Regent    Patient tolerated injection without complications    Given by: Sherilyn Banker (May 09, 2007 8:46 AM)  Injection # 2:    Medication: Depo- Medrol 80mg     Diagnosis: ALLERGIC RHINITIS (ICD-477.9)    Route: IM    Site: L thigh    Exp Date: 05/19/2008    Mfr: sicor    Patient tolerated injection without complications    Given by: Sherilyn Banker (May 09, 2007 8:47 AM)  Orders Added: 1)  Est. Patient Level IV [[address]] 2)  Depo- Medrol 80mg  [J1040] 3)  Admin of Therapeutic Inj  intramuscular or subcutaneous [96372] 4)  Decadron 1mg  [J1094]

## 2010-05-01 ENCOUNTER — Other Ambulatory Visit: Payer: Self-pay | Admitting: Dermatology

## 2010-06-03 NOTE — Consult Note (Signed)
Melissa Velazquez, WYNN NO.:  [id number]   MEDICAL RECORD NO.:  [id number]          PATIENT TYPE:  OUT   LOCATION:  GYN                          FACILITY:  [facility]   PHYSICIAN:  De Blanch, M.D.DATE OF BIRTH:  12-30-1950   DATE OF CONSULTATION:  03/07/2008  DATE OF DISCHARGE:                                 CONSULTATION   CHIEF COMPLAINT:  Endometrial cancer.   INTERVAL HISTORY:  The patient returns for continuing followup.  Since  her last visit she saw Dr. Roselind Messier.  Since then she has done well.  She  denies any GI or GU symptoms.  She has no pelvic pain, pressure, vaginal  bleeding or discharge.  She continues to take Megace three times a day.  She was recently placed on metformin by her primary care physician.   HISTORY OF PRESENT ILLNESS:  Stage I grade 1 endometrial cancer.  The  patient underwent initial surgery by Dr. Liliana Cline in 2000.  She  was followed until July of 2008 when she presented with an inflamed  appendix and underwent laparotomy.  An appendectomy was performed as  well as resection of the right pelvic side wall mass which turned out to  be recurrent endometrial cancer grade 2.  She subsequently received  whole pelvis radiation therapy.  Her recurrent tumor was ER  and PR  positive and therefore she takes Megace 40 mg t.i.d.   MEDICAL ILLNESSES:  1. Diabetes.  2. Obesity.  3. Asthma.  4. Diverticulitis,  5. Thyroid nodules.   PAST SURGICAL HISTORY:  1. TH/BSO 2000.  2. Cholecystectomy, appendectomy 2009.  3. Resection of right pelvic side wall mass.  4. Thyroid biopsy.   FAMILY HISTORY:  Positive for prostate and breast cancer.   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   CURRENT MEDICATIONS:  Cymbalta, metformin, Zyrtec, Combivent inhaler,  vitamin C, Tylenol and Synthroid.   REVIEW OF SYSTEMS:  10-point comprehensive review of systems negative  except as noted above.   PHYSICAL EXAMINATION:  Weight 254 pounds. Blood pressure 140/90.  GENERAL:  The patient is a pleasant, obese, white female in no acute  distress.  HEENT:  Negative.  NECK:  Supple without thyromegaly.  There is no supraclavicular or  ingiuinal adenopathy.  ABDOMEN:  Obese, soft, nontender.  No mass, splenomegaly, ascites or  hernias noted.  Midline incision is well healed.  PELVIC EXAM:  EG BUS.  Vagina about urethra are normal.  Cervix and  uterus surgically absent.  Adnexa without masses.  Rectovaginal exam  confirms.   IMPRESSION:  Recurrent endometrial cancer status post resection and  radiation therapy to the right pelvic sidewall.  No evidence of  recurrent disease.   PLAN:  Pap smear was obtained.  The patient will return to see Dr.  Roselind Messier in 3 months and return to see [place] in 6 months.      De Blanch, M.D.  Electronically Signed     DC/MEDQ  D:  03/07/2008  T:  03/07/2008  Job:  [id number]   cc:  Telford Nab, R.N.  [address]  [place], [place] [address]   Billie Lade, M.D.  Fax: [phone]   Currie Paris, M.D.  [address], [address]  [place]  [place] [address]   Franchot Heidelberg, M.D.

## 2010-06-03 NOTE — Consult Note (Signed)
Melissa Velazquez, Melissa Velazquez NO.:  [id number]   MEDICAL RECORD NO.:  [id number]          PATIENT TYPE:  OUT   LOCATION:  GYN                          [facility]ACILITY[facility]  [facility]   PHYSICIAN:  De Blanch, M.D.DATE OF BIRTH:  28-Jun-1950   DATE OF CONSULTATION:  09/28/2006  DATE OF DISCHARGE:                                 CONSULTATION   CHIEF COMPLAINT:  Recurrent endometrial cancer.   HISTORY OF PRESENT ILLNESS:  This 60 year old white female is seen on  referral of Dr. Jamey Ripa regarding recurrent endometrial cancer.  The  patient's history dates back to 2000 when she underwent a hysterectomy  performed by Dr. Gildardo Griffes.  The extent of that surgery is not  clear at the present time and as to whether she did or did not have of  lymphadenectomy.  Tumor was stage I, grade 1 and no adjuvant therapy was  recommended.  The patient was followed and did well until she developed  some right lower quadrant pain.  Evaluation revealed an enlarged  appendix as well as a pelvic lymph node which, on PET CT scan (July 22, 2006), demonstrated hypermetabolic activity.  The patient underwent  subsequent laparoscopy and open laparotomy by Dr. Jamey Ripa where an  appendectomy and resection of a pelvic sidewall mass was accomplished.  Pathology is consistent with a recurrent endometrial carcinoma, grade 2.  The original slides 2000 were compared and appears to be morphologically  similar.  All gross disease was resected.  The tumor is estrogen and  progesterone receptor positive.  The patient has had an uncomplicated  postoperative recovery.   PAST MEDICAL HISTORY:  1. Obesity.  2. Asthma.  3. Diverticulitis.   PAST SURGICAL HISTORY:  1. TAH-BSO 2000.  2. Cholecystectomy.  3. Appendectomy.  4. Resection of right pelvic sidewall mass.   FAMILY HISTORY:  Positive for prostate and breast cancer.   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   DRUG  ALLERGIES:  NEOMYCIN, BIAXIN and LEVAQUIN.   MEDICATIONS:  Cymbalta, Zyrtec, Combivent inhaler, vitamin C and  Tylenol.   REVIEW OF SYSTEMS:  A 10-point comprehensive review of systems negative  except as noted above.   PHYSICAL EXAM:  Weight 247 pounds.  The patient does not wish to have a  physical examination today.   IMPRESSION:  Recurrent endometrial cancer.   DISCUSSION:  This an unusual finding to have a recurrence some eight  years after initial surgery.  However, based on the pathologic findings,  I have no reason to believe that she has some other site, has certainly  metastatic disease to pelvic lymph nodes as well, recognized in  endometrial cancer.  This has been grossly completely resected at this  time and the patient's preoperative CAT scan showed no other evidence of  metastatic sites.   With regard to further treatment, we discussed pros and cons of local  therapy as well as systemic therapy.  Given that she had localized  disease, I think it would be very reasonable to consider external beam  radiation therapy to this tumor  bed.  I would suggest consultation with  a radiation oncologist for consideration of at least a right hemipelvis  radiation therapy with extension probably to the lower aortic chain  based on the site of increased activity on the preoperative PET scan.  We also discussed the potential value of systemic therapy either in the  form of chemotherapy (carboplatin and Taxol) or, given the fact that her  tumor is ERPR positive, consider using progestins.  We discussed pros  and cons of each and the expected side effects.  At the conclusion of  the discussion, we did not make a final decision as to whether systemic  therapy would be acceptable to the patient and she will consider this  further.  However, in the interim, we will have her obtain a  consultation with the radiation oncologist and initiate therapy with  external beam.  I plan on seeing  the patient back again for follow-up at  the completion of her external beam therapy.      De Blanch, M.D.  Electronically Signed     DC/MEDQ  D:  09/28/2006  T:  09/29/2006  Job:  [id number]   cc:   Telford Nab, R.N.  [address]  [place], [place] [address]   Currie Paris, M.D.  [address], [address]  [place]  [place] [address]   Maryln Gottron, M.D.  Fax: [phone]   Ladona Horns. Mariel Sleet, MD  Fax: [phone]   Billie Lade, M.D.  Fax: [phone]

## 2010-06-03 NOTE — Discharge Summary (Signed)
Melissa Velazquez, Melissa Velazquez NO.:  [id number]   MEDICAL RECORD NO.:  [id number]          PATIENT TYPE:  INP   LOCATION:  1534                         [facility]ACILITY:  [facility]   PHYSICIAN:  Currie Paris, M.D.DATE OF BIRTH:  02-11-1950   DATE OF ADMISSION:  08/20/2006  DATE OF DISCHARGE:  08/23/2006                               DISCHARGE SUMMARY   FINAL DIAGNOSIS:  1. 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  2. Metastatic endometrial carcinoma to pelvic sidewall.  3. Chronic calculous cholecystitis.   MEDICAL HISTORY:  [name] is a 60 year old lady who developed some  abdominal pain and workup including CT scan showed a thickened appendix  worrisome for tumor as well as Jon Billings mass in the right pelvic  sidewall.  Additionally she was noted to have gallstones and had biliary  type symptoms.  She was scheduled for surgery for these diagnoses.   HOSPITAL COURSE:  The patient was admitted and taken to the operating  room.  A laparoscopic cholecystectomy was done.  Laparoscopic assisted  appendectomy was done with a finding of what appeared to be a tumor  involving the appendix but the mesentery appeared to be unremarkable.  In addition a pelvic side wall mass was excised and was consistent with  carcinoma.   Postoperatively the patient had a benign postoperative course.  She  developed bowel sounds, was able to start on liquids and advanced to  solids.  Her wound remained clean.  She remained afebrile.   Other laboratory studies included an EKG which was normal.  Preop  hemoglobin of 14.1.  Electrolytes were normal preoperatively.  Urinalysis was clear.  CEA was less than 0.5.      Currie Paris, M.D.  Electronically Signed     CJS/MEDQ  D:  09/21/2006  T:  09/21/2006  Job:  [id number]   cc:   Franchot Heidelberg, M.D.

## 2010-06-03 NOTE — Op Note (Signed)
Melissa Velazquez, FUERTE NO.:  [id number]   MEDICAL RECORD NO.:  [id number]          PATIENT TYPE:  INP   LOCATION:  1534                         FACILITY:  [facility]   PHYSICIAN:  Currie Paris, M.D.DATE OF BIRTH:  08-Dec-1950   DATE OF PROCEDURE:  08/20/2006  DATE OF DISCHARGE:                               OPERATIVE REPORT   CCS #:  [id number].   PREOPERATIVE DIAGNOSES:  1. Gallstones.  2. Appendiceal mass, chronic appendicitis versus appendiceal tumor.  3. Pelvic mass suspicious for carcinoma.  4. History of endometrial carcinoma.   POSTOPERATIVE DIAGNOSES:  1. Chronic calculus cholecystitis.  2. Extensive intra-abdominal adhesions.  3. Adenocarcinoma of the appendix, uncertain primary.  4. Metastatic pelvic lymph node adenocarcinoma.   OPERATION:  Laparoscopic cholecystectomy with operative cholangiogram,  laparoscopic assisted appendectomy, laparoscopic lysis of adhesions,  laparoscopic-assisted excision of pelvic lymph node.   SURGEON:  Dr. Jamey Ripa.   ASSISTANT:  Dr. Corliss Skains.   ANESTHESIA:  General endotracheal.   CLINICAL HISTORY:  Melissa Velazquez is a 60 year old lady with a remote  history of endometrial carcinoma.  This was treated with a total  hysterectomy many years ago.  More recently she has had biliary type  symptoms and those had not been formally worked out.  She was also  having episodes of right lower quadrant pain and eventually had a CAT  scan which showed gallstones.  Alson seen was a markedly enlarged  appendix consistent either with a chronic appendicitis or an appendiceal  tumor and what appeared to be an enlarged pelvic lymph node. A PET scan  showed malignant type activity in the pelvic lymph node as well as the  appendix.  The pelvic lymph node was not in a place amenable to a  percutaneous biopsy.  After a lengthy discussion with the patient, we  elected to proceed to a laparoscopic cholecystectomy with possible  laparoscopic or  open appendectomy and/or right colectomy and  laparoscopic or open excision of her pelvic mass.   DESCRIPTION OF PROCEDURE:  The patient was seen in the holding area and  she had no further questions.  She was taken to the operating room and  after satisfactory general endotracheal anesthesia had been obtained was  placed in some stirrups in case we needed access to stand between the  legs for the laparoscopic-assisted right colectomy.  The abdomen was  then completely prepped and draped and the time-out occurred.   I started making an umbilical incision.  The fascia was opened and the  peritoneal cavity entered under direct vision.  I felt some adhesions  and was able to get the trocar in easily and we insufflated the abdomen.  I placed the camera, I could see there were multiple adhesions but I was  able to work my way around to see into the upper mid abdomen and right  upper quadrant, so I went ahead at this point and put a 10/11 trocar in  the epigastrium.  With the camera in that port, I was able to put two 5-  mm trocars laterally.  Looking back towards the umbilicus,  I saw  multiple abdominal adhesions of omentum to the midline from her prior  hysterectomy.  Some of these were taken down sharply initially where we  could see using the scissors in the lateral port.  Once that was done I  was able to take the umbilical port out and bluntly get some more of  these adhesions down with my finger so that we could then get the port  in to do the laparoscopic cholecystectomy.  Other adhesions that were  further inferior were left temporarily alone.   The patient was placed in reverse Trendelenburg.  The gallbladder was  retracted over the liver.  The peritoneum over the cystic duct was  opened and the cystic duct and artery identified.  The cystic duct was  nicely dissected out, clipped and opened.  An operative cholangiogram  was done with a Cook catheter and appeared to be normal.   Four clips  were placed on the stay side of the cystic duct.  The cystic artery was  dissected out, triple clipped and divided.  Some posterior branches were  clipped as we brought the gallbladder out from below to above.  Once  this disconnected, I put it a bag, irrigated and made sure everything  was dry.  I brought the gallbladder out the umbilical site.  We put the  trocar back, reinsufflated and felt that we had good hemostasis with no  evidence of bile leak or bleeding.   The patient was then put initially supine rather than reverse  Trendelenburg and tilted somewhat more to the left.  I was able to see a  little bit under the right lower quadrant and eventually identify the  appendix which appeared to be stuck a little bit laterally and was not  very mobile.  I did not think I was going to be able to do this  completely laparoscopically.  In addition, there was a large layer of  omental adhesions to the lower midline and in the left lower quadrant.  I did not feel those were going to come down very readily to put a left  lower quadrant port in.   At this point, I decided to make a right lower quadrant, mid transverse  incision.  This was done. The subcutaneous tissue was divided with the  cautery, the anterior fascia and rectus were divided with the cautery  with the epigastrics clipped. The peritoneal cavity was entered.  Initially I put the GelPort retainer in so I could get my hand in.  I  was able to mobilize the appendix up and we could see that it was  thickened distally consistent either with a chronic appendicitis or  tumor.  Prior to doing this, I had actually mobilized the ascending  colon with the harmonic before we made the right lower quadrant incision  but did not mention that just above. This allowed the mobility to get  the cecum and appendix into the wound.   Once we had this up in the wound, I went ahead and used the harmonic to  divide the mesoappendix.  The  base of the appendix for the first couple  of centimeters looked normal.  I crushed the base of the appendix with a  clamp, ligated it with a 2-0 chromic and amputated it and sent this for  frozen section.  I put a pursestring of 3-0 silk around, inverted the  tip and inverted it and tied down.  There were really no palpable lymph  nodes in the small bowel mesentery and the small bowel appeared to be  perfectly fine.  With my hand in, I could palpate the pelvic mass and it  felt just to be below the peritoneum but mobile and I could pick this up  nicely. I felt that I would be able to get this out.   We then put the GelPort on so I could do a hand assisted and  reinsufflated.  Using the camera and some of the side ports, I was able  to grasp this with my left hand and with my right hand used the harmonic  to open the peritoneum and then bluntly and with the harmonic get this  up and bring it out through the GelPort.  This likewise was sent for  frozen section.   Prior to doing this, I freed up all the omental adhesions in the left  lower quadrant because we needed a port there to better visualize the  pelvis and put the camera in and this was also done prior to putting the  GelPort in and doing the dissection of the pelvic lymph node. This was  done with the scope for visualization as well as some blunt dissection  with my hand through the RLQ incision. We spent a total of 30 extra  minutes dealing with all the adhesions.   With that done, I irrigated and carefully made sure everything was dry.  There was a little oozing around the area where the lymph node had been  removed so I was able to control this with the harmonic.  I put some  Surgicel on there and we waited approximately 10 minutes, did not appear  to be bleeding and put some more Surgicel in, waited another 10 minutes  while we were irrigating and checking other areas and at the time we  were closing this appeared to be dry  without any evidence of any  bleeding.  The omentum where we had taken down was dry.  There was no  bleeding either from the anterior abdominal wall or the omentum.  The  ports were removed and none of those port sites appeared to be bleeding.   At this point having irrigated and checked for hemostasis and felt that  we were essentially done pending pathology.  The pathology report  indicated that there was adenocarcinoma in the appendix but frozen of  the base was negative.  It was unclear if this was a primary colon or  metastasis from an endometrial cancer.  Likewise the low pelvic lymph  node which was fairly down in the pelvis was adenocarcinoma.  This  really is not in the normal drainage position for the appendix and I  suspect that represented delayed metastasis from her endometrial  carcinoma.  At this point, I did not believe it was appropriate to do  any further lymph node dissection nor any further colectomy until we  knew exactly whether this was all endometrial cancer or all colonic  cancer with pelvic lymph node met.  Given there are two cancers, I did  not think more wide surgical excision was appropriate especially if this  is not colonic.   At this point, the peritoneal cavity was closed.  A #1 PDS was used on  the posterior perineum and then #1 running PDS on the anterior fascia.  The wound was irrigated and the skin closed with staples.   The patient tolerated the procedure well.  There were no operative  complications.  Estimated blood loss was about 150.  All counts were  correct.      Currie Paris, M.D.  Electronically Signed     CJS/MEDQ  D:  08/20/2006  T:  08/21/2006  Job:  [id number]   cc:   Franchot Heidelberg, M.D.

## 2010-06-03 NOTE — Consult Note (Signed)
Melissa Velazquez, Melissa Velazquez NO.:  [id number]   MEDICAL RECORD NO.:  [id number]          PATIENT TYPE:  OUT   LOCATION:  GYN                          FACILITY:  [facility]   PHYSICIAN:  De Blanch, M.D.DATE OF BIRTH:  [birth year]   DATE OF CONSULTATION:  03/18/2007  DATE OF DISCHARGE:                                 CONSULTATION   CHIEF COMPLAINT:  Recurrent endometrial cancer.   INTERVAL HISTORY:  Since her last visit with me, the patient has  completed radiation therapy under the direction Dr. Roselind Messier.  Her  radiation was completed in November 2008, 4500 cGy to the whole pelvis.  Since then, the patient has done well.  We had discussed with her  previously the possibility of using adjuvant chemotherapy or Megace  therapy given that her tumor was ERPR positive.   HISTORY OF PRESENT ILLNESS:  The patient initially underwent  hysterectomy by Dr. Gildardo Griffes in 2000.  She had a stage I grade 1  lesion and no adjuvant therapy was recommended.  She returned in July  2008 with an inflamed appendix and on PET CT scan was found to have  significant uptake.  She subsequently underwent laparoscopy and open  laparotomy by Dr. Jamey Ripa for an appendectomy and resection of pelvic  sidewall mass was accomplished.  Pathology was consistent with recurrent  endometrial cancer, grade 2.  Correlation with the origin tumor in 2000  confirmed a recurrence.  The recurrent tumor was estrogen and  progesterone receptor positive.   PAST MEDICAL HISTORY:  Obesity, asthma and diverticulitis.   PAST SURGICAL HISTORY:  TAH-BSO in 2000, cholecystectomy, appendectomy,  resection of right pelvic sidewall mass and thyroid biopsy.   FAMILY HISTORY:  Positive for prostate and breast cancer.   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   CURRENT MEDICATIONS:  Cymbalta, Zyrtec, Combivent inhaler, vitamin C and  Tylenol.   REVIEW OF SYSTEMS:  A 10-point  comprehensive review of systems negative  except as noted above.   PHYSICAL EXAM:  Weight 250 pounds, height 5 foot 5.  GENERAL:  The patient is an obese white female in no acute distress.  HEENT:  Negative.  NECK:  Supple without thyromegaly. There is no supraclavicular or  inguinal adenopathy.  ABDOMEN:  Soft, nontender.  No mass, organomegaly, ascites or hernias  are noted.  All incisions are well-healed.  PELVIC:  EGBUS, vagina, bladder, urethra are normal.  The cervix and  uterus is surgically absent.  Adnexa without masses.  Rectovaginal exam  confirms.   IMPRESSION:  Recurrent endometrial cancer on the right pelvic sidewall  involving a lymph node and appendix.  The patient has now completed  whole pelvis radiation therapy and seems to be doing well.   We discussed again at length the pros and cons of adjuvant chemotherapy  versus Megace therapy. After discussing this at some length, we have  decided to proceed with placing the patient on Megace 40 mg t.i.d.   We will obtain a CA-125 today. She will return to see Dr. Roselind Messier in 3  months  and return to see [place] in 6 months. We will schedule the patient to  have another PET CT scan to be used as a baseline for comparison in the  future.   Finally, the patient is referred back to her primary physician regarding  further evaluation and management of her thyroid.      De Blanch, M.D.  Electronically Signed     DC/MEDQ  D:  03/18/2007  T:  03/19/2007  Job:  [id number]   cc:   Telford Nab, R.N.  [address]  [place], [place] [address]   Currie Paris, M.D.  [address]  [place]  [place] [address]   Franchot Heidelberg, M.D.

## 2010-06-03 NOTE — Consult Note (Signed)
NAME:  Melissa Velazquez, Melissa Velazquez NO.:  [id number]   MEDICAL RECORD NO.:  [id number]          PATIENT TYPE:  OUT   LOCATION:  GYN                          FACILITY:  [facility]   PHYSICIAN:  De Blanch, M.D.DATE OF BIRTH:  [birth year]   DATE OF CONSULTATION:  09/16/2007  DATE OF DISCHARGE:  09/16/2007                                 CONSULTATION   GYNECOLOGY/ONCOLOGY CLINIC:   CHIEF COMPLAINT:  Endometrial cancer (recurrent).   INTERVAL HISTORY:  The patient returns today for continuing follow up.  Her last visit was with Dr. Roselind Messier in May, at which time she was doing  quite well.  Subsequently, she had a PET and CT scan of the chest,  abdomen and pelvis in March, which were both negative.   The patient reports she has been doing well.  She denies any GI or GU  symptoms.  Has no pelvic pain, pressure, vaginal bleeding or discharge.  Her functional status has been excellent.   The patient recently had a needle aspiration of a thyroid nodule which  only showed hyperplastic adenomatous nodule.  The patient tells me that  she is going to be scheduled to have a thyroidectomy in the near future.   HISTORY OF PRESENT ILLNESS:  Stage I, grade I endometrial carcinoma,  undergoing initial surgery by Dr. Gildardo Griffes in 2000.  She was then  followed until July of 2008 when she presented with an inflamed appendix  and underwent laparotomy by Dr. Cyndia Bent.  An appendectomy was  performed, as well as resection of a pelvic sidewall mass which turned  out to be recurrent endometrial carcinoma, grade II.  She subsequently  underwent full pelvis radiation therapy.  Her tumor was ER PR positive,  and use of Megace was suggested.   PAST MEDICAL HISTORY:   MEDICAL ILLNESSES:  1. Obesity.  2. Asthma.  3. Diverticulitis.  4. Thyroid nodules.   PAST SURGICAL HISTORY:  1. Total abdominal hysterectomy/bilateral salpingo-oophorectomy in      2000.  2. Cholecystectomy.  3. Appendectomy in 2009.  4. Resection of right pelvic sidewall mass.  5. Thyroid biopsy.   FAMILY HISTORY:  Positive for prostate and breast cancer.   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   CURRENT MEDICATIONS:  1. Cymbalta.  2. Zyrtec.  3. Combivent inhaler.  4. Vitamin C.  5. Tylenol.  6. Synthroid.   REVIEW OF SYSTEMS:  A 10-point comprehensive review of systems is  negative, except as noted above.   PHYSICAL EXAMINATION:  VITAL SIGNS:  Weight 251 pounds.  Blood pressure  130/70, pulse 80, respiratory rate 20.  GENERAL:  The patient is a healthy white female in no acute distress.  HEENT:  Negative.  NECK:  Supple without thyromegaly.  No masses, organomegaly, ascites or  hernias noted.  PELVIC:  EG/BUS, vagina, bladder, urethra are normal.  Cervix and uterus  are surgically absent.  Adnexa without masses.  RECTOVAGINAL:  Confirms.   IMPRESSION:  Recurrent endometrial carcinoma, clinically proved disease.   PLAN:  The patient will continue  taking Megace 40 mg a day.  Pap smear  is obtained today.  She will return to see me in 6 months and Dr. Roselind Messier  in 3 months.      De Blanch, M.D.  Electronically Signed     DC/MEDQ  D:  09/19/2007  T:  09/19/2007  Job:  [id number]   cc:   Billie Lade, M.D.  Fax: [phone]   Currie Paris, M.D.  [address]  [place]  [place] [address]   Telford Nab, R.N.  [phone] [address]  [place], [place] [address]   Franchot Heidelberg, M.D.

## 2010-06-06 NOTE — Op Note (Signed)
NAME:  Melissa Velazquez, Melissa Velazquez                         ACCOUNT NO.:  [id number]   MEDICAL RECORD NO.:  [id number]                   PATIENT TYPE:  AMB   LOCATION:  DAY                                  FACILITY:  [facility]   PHYSICIAN:  Pershing Cox, M.D.            DATE OF BIRTH:  Jun 07, 1950   DATE OF PROCEDURE:  03/17/2002  DATE OF DISCHARGE:                                 OPERATIVE REPORT   PREOPERATIVE DIAGNOSIS:  Right labial apocrine neoplasm status post biopsy.   POSTOPERATIVE DIAGNOSIS:  Right labial apocrine neoplasm.   PROCEDURE:  Wide excision of right labial lesion.   SURGEON:  Pershing Cox, M.D.   ANESTHESIA:  MAC plus Marcaine local.   INDICATIONS FOR PROCEDURE:  Melissa Velazquez is a 60 year old female who is  followed in my office for a history of stage 1 grade 1 adenocarcinoma of the  endometrium. She is four years out from that diagnosis. When seen in  December, she was found to have a lesion of her right labia. This looked  like a sebaceous cyst. She came to my office on February 16, 2002 and we  attempted an incision but the character of the sebaceous cyst showed a  central excrescence and it was suspicious. These biopsies were sent and  returned as atypical apocrine neoplasm. She is brought to the operating room  today for wide excision.   FINDINGS:  The only lesion on the vulva is a 1/2 cm umbilicated mass arising  from the upper labia majora on the right. It is approximately 1 cm deep.  There are no palpable right inguina lymph nodes.   DESCRIPTION OF PROCEDURE:  Melissa Velazquez was brought to the operating room  with an IV in place. She had received a gram of Ancef in the holding area.  Supine on the OR table, IV sedation was administered. She was then placed  into Allen stirrups and exam under anesthesia was performed. Hibiclens was  used to prep the vagina, perineum, lower abdomen and upper thighs. Sterile  drapes were used to prepare for a surgical  procedure. The bladder was  sterilely drained with a red rubber catheter. The right vulvar lesion was  visualized and palpated. The marking pen was used to outline a 1 cm  elliptical margin. This was marked with a marking pen. A 0.25% Marcaine was  injected circumferentially and beneath the lesion. Using a 15 scalpel blade,  the skin edges were incised. Metzenbaum scissors were used to lift the skin  and Bovie cautery was then used to excise the lesion from the underlying  subcutaneous tissue. There was a bulging mass in the upper labia. It  contained fat. It was opened and was not a hernia. It did not extend up into  the inguinal area. This sac was closed with a 2-0 Vicryl suture. Once  hemostasis had been obtained in the base of the incision, the skin  edges  were approximated with 4-0 Vicryl suture using a running suture. The skin  edges were then closed with a 4-0 Vicryl using interrupted sutures. The  patient was taken to the recovery room in good condition. She will be  discharged from the recovery room to home.                                               Pershing Cox, M.D.    MAJ/MEDQ  D:  03/17/2002  T:  03/17/2002  Job:  [id number]   cc:   Ike Bene, M.D.  [address]  [place]  [place] [address]  Fax: [phone]

## 2010-08-20 ENCOUNTER — Encounter: Payer: Self-pay | Admitting: Gastroenterology

## 2010-09-02 ENCOUNTER — Telehealth: Payer: Self-pay | Admitting: Gastroenterology

## 2010-09-02 NOTE — Telephone Encounter (Signed)
Received copies from [facility] on 09/02/10. Forwarded  23pages to Dr. Russella Dar for review.

## 2010-09-17 ENCOUNTER — Ambulatory Visit (INDEPENDENT_AMBULATORY_CARE_PROVIDER_SITE_OTHER): Payer: BC Managed Care – PPO | Admitting: Gastroenterology

## 2010-09-17 ENCOUNTER — Encounter: Payer: Self-pay | Admitting: Gastroenterology

## 2010-09-17 VITALS — BP 134/72 | HR 80 | Ht 65.0 in | Wt 236.0 lb

## 2010-09-17 DIAGNOSIS — K921 Melena: Secondary | ICD-10-CM

## 2010-09-17 DIAGNOSIS — Z860100 Personal history of colon polyps, unspecified: Secondary | ICD-10-CM | POA: Insufficient documentation

## 2010-09-17 DIAGNOSIS — K5730 Diverticulosis of large intestine without perforation or abscess without bleeding: Secondary | ICD-10-CM

## 2010-09-17 DIAGNOSIS — R109 Unspecified abdominal pain: Secondary | ICD-10-CM

## 2010-09-17 MED ORDER — NA SULFATE-K SULFATE-MG SULF 17.5-3.13-1.6 GM/177ML PO SOLN: 1.0000 | ORAL | Status: DC

## 2010-09-17 NOTE — Progress Notes (Signed)
History of Present Illness: This is a 60 year old female who relates several episodes of diverticulitis over the past 4-5 years. She states she has been treated by her primary care physician with antibiotics and her symptoms promptly resolved. She has a history of uterine cancer and has had followup CT scans performed in 2009 and 2011 did not show evidence of recurrence or evidence of diverticulitis. She has a history of colon polyps and was followed previously by Dr. Cleotis Nipper. Colonoscopy February 2006 showed a tubulovillous adenoma with high grade dysplasia, internal hemorrhoids and left-sided diverticulosis. Colonoscopy in June 2009 showed a small adenomatous colon polyp and pandiverticulosis. She has intermittent small amounts of rectal bleeding which she attributes to hemorrhoids. She relates episodes of left-sided abdominal pain that seemed to respond to courses of antibiotics. Imaging studies have not confirmed diverticulitis. Denies weight loss, abdominal pain, constipation, diarrhea, change in stool caliber, melena, hematochezia, nausea, vomiting, dysphagia, reflux symptoms, chest pain.  Past Medical History  Diagnosis Date  . Thyroid nodule   . Diverticular disease   . Hyperglycemia   . Sebaceous cyst   . Obesity   . Hypertension   . Endometrial cancer 2001    spread to appendix   . GERD (gastroesophageal reflux disease)   . Depression   . Asthma   . Allergic rhinitis   . Arthritis   . Anal fissure     Hx of   . Thyroid disease   . 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  . Hemorrhoids    Past Surgical History  Procedure Date  . Total abdominal hysterectomy 2001  . Pelvic fracture surgery 2008    repeat for uterine cancer mets  . Wisdom tooth extraction   . Cholecystectomy 2008  . Appendectomy 2008    reports that she has quit smoking. She has never used smokeless tobacco. She reports that she drinks alcohol. She reports that she does not use illicit drugs. family  history includes Breast cancer in her mother; Diabetes in her mother; and Obesity in her sister.  There is no history of Colon cancer. Allergies  Allergen Reactions  . Levofloxacin     REACTION: Stomach upset   Outpatient Encounter Prescriptions as of 09/17/2010  Medication Sig Dispense Refill  . levothyroxine (SYNTHROID, LEVOTHROID) 75 MCG tablet Take 75 mcg by mouth daily.        Marland Kitchen lisinopril (PRINIVIL,ZESTRIL) 5 MG tablet Take 5 mg by mouth daily.        . megestrol (MEGACE) 40 MG/ML suspension Take by mouth 3 (three) times daily.       . metFORMIN (GLUCOPHAGE) 500 MG tablet Take 500 mg by mouth daily with breakfast.        . sertraline (ZOLOFT) 100 MG tablet Take 100 mg by mouth. Takes 150 mg once daily       . Na Sulfate-K Sulfate-Mg Sulf (SUPREP BOWEL PREP) SOLN Take 1 kit by mouth as directed.  177 mL  0  . DISCONTD: Ascorbic Acid (VITAMIN C) 100 MG tablet Take 100 mg by mouth daily.        Marland Kitchen DISCONTD: levothyroxine (LEVOTHROID) 50 MCG tablet Take 50 mcg by mouth daily.        Marland Kitchen DISCONTD: loratadine-pseudoephedrine (CLARITIN-D 24-HOUR) 10-240 MG per 24 hr tablet Take 1 tablet by mouth daily.        Marland Kitchen DISCONTD: venlafaxine (EFFEXOR-XR) 150 MG 24 hr capsule Take 150 mg by mouth daily.         Review of  Systems: Pertinent positive and negative review of systems were noted in the above HPI section. All other review of systems were otherwise negative.  Physical Exam: General: Well developed , well nourished, obese,  no acute distress Head: Normocephalic and atraumatic Eyes:  sclerae anicteric, EOMI Ears: Normal auditory acuity Mouth: No deformity or lesions Neck: Supple, no masses or thyromegaly Lungs: Clear throughout to auscultation Heart: Regular rate and rhythm; no murmurs, rubs or bruits Abdomen: Soft, non tender and non distended. No masses, hepatosplenomegaly or hernias noted. Normal Bowel sounds Rectal: Deferred to colonoscopy  Musculoskeletal: Symmetrical with no gross  deformities  Skin: No lesions on visible extremities Pulses:  Normal pulses noted Extremities: No clubbing, cyanosis, edema or deformities noted Neurological: Alert oriented x 4, grossly nonfocal Cervical Nodes:  No significant cervical adenopathy Inguinal Nodes: No significant inguinal adenopathy Psychological:  Alert and cooperative. Normal mood and affect  Assessment and Recommendations:  1. Recurrent episodes of left sided abdominal pain with a history of significant diverticulosis. She may have had irritable bowel type symptoms. She may have had several episodes of diverticulitis. She was advised to remain on a high fiber diet drink adequate amounts of water on a daily basis. She may take a fiber supplement if she cannot get enough fiber in her regular diet.  2. Intermittent small hematochezia. Suspect a benign source of bleeding such as hemorrhoids however colorectal neoplasm and other disorders need to be further excluded. Schedule colonoscopy. The risks, benefits, and alternatives to colonoscopy with possible biopsy and possible polypectomy were discussed with the patient and they consent to proceed.   3. Personal history of a tubulovillous adenoma with high-grade dysplasia in 2006. Personal history of uterine cancer. 3 year interval for surveillance colonoscopies based on her history is recommended. See problem #2.

## 2010-09-17 NOTE — Patient Instructions (Signed)
You have been scheduled for a Colonoscopy. See separate instructions.  Pick up your prep kit from your pharmacy.  High fiber diet and diverticulosis information given. cc: Elfredia Nevins, MD

## 2010-09-18 ENCOUNTER — Other Ambulatory Visit: Payer: Self-pay | Admitting: Internal Medicine

## 2010-09-18 DIAGNOSIS — Z1231 Encounter for screening mammogram for malignant neoplasm of breast: Secondary | ICD-10-CM

## 2010-10-10 ENCOUNTER — Ambulatory Visit
Admission: RE | Admit: 2010-10-10 | Discharge: 2010-10-10 | Disposition: A | Discharge status: Home / Self Care | Payer: BC Managed Care – PPO | Source: Ambulatory Visit | Attending: Internal Medicine | Admitting: Internal Medicine

## 2010-10-10 DIAGNOSIS — Z1231 Encounter for screening mammogram for malignant neoplasm of breast: Secondary | ICD-10-CM

## 2010-10-10 LAB — BASIC METABOLIC PANEL
Chloride: 106
GFR calc Af Amer: >60
Potassium: 4
Sodium: 141

## 2010-10-10 LAB — CA 125: CA 125: 8.6

## 2010-10-23 ENCOUNTER — Telehealth: Payer: Self-pay | Admitting: Gastroenterology

## 2010-10-23 ENCOUNTER — Other Ambulatory Visit: Payer: BC Managed Care – PPO | Admitting: Gastroenterology

## 2010-10-23 ENCOUNTER — Telehealth: Payer: Self-pay | Admitting: *Deleted

## 2010-10-23 DIAGNOSIS — R112 Nausea with vomiting, unspecified: Secondary | ICD-10-CM

## 2010-10-23 NOTE — Telephone Encounter (Signed)
Complaining of nausea and vomitting. States cannot drink the morning dose of the prep. Spoke with Dr Russella Dar and he wants her to use a phenergan suppository now and again before she comes in for the procedure and take 2 bottles of magnesium citrate.  Bufford Spikes RN called pt and was telling her Dr Ardell Isaacs instructions and when she started to go over the magnesium citrate the phone went dead and the pt will not pick up the phone now when trying to call her back.  The last time we attempted to call the phone is busy. Dr Russella Dar notified.

## 2010-10-23 NOTE — Telephone Encounter (Signed)
Calling back and stated her phone was acting up and she got cut off.  She was very rude on the phone and states she will not be coming in today for the procedure.  She states if she is throwing up now we couldn't possibly expect her to drink more prep for the procedure.  Again tried to explain to her the instructions from Dr Russella Dar. She States again she is not coming in today and wants to know if she is having an allergic reaction to the prep.  I tried to explain to her that the prep sometimes will make people sick and the phenergan should help with the nausea and vomitting. I tried to apologize but she didn't want any part of that.  Very rude and hateful the whole time I tried to talk with her.

## 2010-10-23 NOTE — Telephone Encounter (Signed)
No charge. 

## 2010-10-29 ENCOUNTER — Ambulatory Visit: Payer: BC Managed Care – PPO | Attending: Gynecology | Admitting: Gynecology

## 2010-10-29 ENCOUNTER — Other Ambulatory Visit (HOSPITAL_COMMUNITY)
Admission: RE | Admit: 2010-10-29 | Discharge: 2010-10-29 | Disposition: A | Discharge status: Home / Self Care | Payer: BC Managed Care – PPO | Source: Ambulatory Visit | Attending: Gynecology | Admitting: Gynecology

## 2010-10-29 ENCOUNTER — Other Ambulatory Visit: Payer: Self-pay | Admitting: Gynecology

## 2010-10-29 DIAGNOSIS — Z79899 Other long term (current) drug therapy: Secondary | ICD-10-CM | POA: Insufficient documentation

## 2010-10-29 DIAGNOSIS — E041 Nontoxic single thyroid nodule: Secondary | ICD-10-CM | POA: Insufficient documentation

## 2010-10-29 DIAGNOSIS — Z8540 Personal history of malignant neoplasm of unspecified female genital organ: Secondary | ICD-10-CM | POA: Insufficient documentation

## 2010-10-29 DIAGNOSIS — C549 Malignant neoplasm of corpus uteri, unspecified: Secondary | ICD-10-CM | POA: Insufficient documentation

## 2010-10-29 DIAGNOSIS — K219 Gastro-esophageal reflux disease without esophagitis: Secondary | ICD-10-CM | POA: Insufficient documentation

## 2010-10-29 DIAGNOSIS — J45909 Unspecified asthma, uncomplicated: Secondary | ICD-10-CM | POA: Insufficient documentation

## 2010-10-29 DIAGNOSIS — I10 Essential (primary) hypertension: Secondary | ICD-10-CM | POA: Insufficient documentation

## 2010-10-31 NOTE — Consult Note (Signed)
Melissa Velazquez, BURDITT NO.:  [id number]  MEDICAL RECORD NO.:  [id number]  LOCATION:  GYN                          FACILITY:  [facility]  PHYSICIAN:  De Blanch, M.D.DATE OF BIRTH:  Apr 21, 1950  DATE OF CONSULTATION:  10/29/2010 DATE OF DISCHARGE:                                CONSULTATION   CHIEF COMPLAINT:  Recurrent endometrial cancer.  INTERVAL HISTORY:  The patient returns today for continuing followup as previously scheduled.  She failed to see Dr. Roselind Messier as recommended.  She has seen Dr. Russella Dar regarding irritable bowel syndrome.  She denies any other GI or GU symptoms.  She has no pelvic pain, pressure, vaginal bleeding, or discharge.  Her functional status is excellent.  HISTORY OF PRESENT ILLNESS:  The patient underwent surgery in 2000 for a stage I, grade 1 endometrial cancer.  She was followed until 2008 when she presented with an inflamed appendix.  At the time of laparotomy, she was noted to have a right-sided pelvic sidewall mass, which is recurrentgrade 2 endometrial cancer.  Tumor was ER/PR positive.  The patient was placed on Megace as well as receiving adjuvant radiation therapy.  She has been followed since that time with no evidence of recurrent disease.  PAST MEDICAL HISTORY/MEDICAL ILLNESSES:  Obesity.  OTHER ILLNESSES: 1. Thyroid nodule. 2. Diverticular disease. 3. Hyperglycemia. 4. Hypertension. 5. GERD. 6. Depression. 7. Asthma. 8. Allergic rhinitis. 9. Arthritis. 10.History of a tubulovillous adenoma of the colon. 11.Hemorrhoids.  PAST SURGICAL HISTORY:  TAH-BSO, appendectomy, cholecystectomy.  ALLERGIES:  Levofloxacin (upset stomach).  CURRENT MEDICATIONS:  Synthroid, Prinivil, Megace, metformin, Zoloft, vitamin C, levothyroxine, Claritin, and Effexor.  SOCIAL HISTORY:  The patient does not smoke.  She is a Warden/ranger.  FAMILY HISTORY:  Mother with breast cancer.  REVIEW OF SYSTEMS:  Ten-point comprehensive  review of systems negative except as noted above.  PHYSICAL EXAMINATION:  VITAL SIGNS:  Weight 232 pounds , height 5 feet 5 inches, remainder of vital signs are in the chart. GENERAL:  The patient is obese, pleasant white female, in no acute distress. HEENT:  Negative. NECK:  Supple without thyromegaly.  There is no supraclavicular or inguinal adenopathy.  ABDOMEN:  Soft, nontender.  No masses, organomegaly, ascites, or hernias noted. PELVIC:  EGBUS, vagina, bladder, urethra, are normal.  Cervix and uterus surgically absent.  Adnexa without masses. RECTOVAGINAL:  Confirms.  IMPRESSION:  Recurrent endometrial cancer, now with 4 years of negative followup since re-treatment.  No evidence of disease, Pap smears are obtained.  PLAN:  The patient will return to see one of the physicians in the [facility] practice that initially did her surgery (Dr. Gildardo Griffes) and then return to see me in 1 year.  At that juncture, she will be 5 years of followup and we will return her to the care of [facility].     De Blanch, M.D.     DC/MEDQ  D:  10/29/2010  T:  10/30/2010  Job:  [id number]  cc:   Telford Nab, R.N. [address] [place], [place] [address]  Lendon Colonel, MD Fax: [phone]  Venita Lick. Russella Dar, MD, FACG [address] [place] [address]  Franchot Heidelberg, M.D.  Currie Paris, M.D. [address] [place] [address]  Electronically Signed by De Blanch M.D. on 10/31/2010 11:14:16 AM

## 2010-11-03 LAB — CBC
Hemoglobin: 14.1
RBC: 4.46
RDW: 13.3
WBC: 5.6

## 2010-11-03 LAB — COMPREHENSIVE METABOLIC PANEL
ALT: 27
AST: 23
CO2: 29
Calcium: 9.2
GFR calc Af Amer: >60
GFR calc non Af Amer: >60
Sodium: 140

## 2010-11-03 LAB — URINALYSIS, ROUTINE W REFLEX MICROSCOPIC
Ketones, ur: NEGATIVE
Nitrite: NEGATIVE
Specific Gravity, Urine: 1.022
pH: 6

## 2010-11-03 LAB — DIFFERENTIAL
Eosinophils Absolute: 0.2
Eosinophils Relative: 4
Lymphs Abs: 1.5
Monocytes Relative: 8

## 2010-12-01 MED ORDER — PROMETHAZINE HCL 50 MG RE SUPP: 25.0000 mg | Freq: Four times a day (QID) | RECTAL | Status: AC | PRN

## 2010-12-01 NOTE — Telephone Encounter (Signed)
Addended by: Marcy Salvo on: 12/01/2010 08:25 AM   Modules accepted: Orders

## 2011-09-17 ENCOUNTER — Other Ambulatory Visit (HOSPITAL_COMMUNITY): Payer: Self-pay | Admitting: Family Medicine

## 2011-09-17 ENCOUNTER — Ambulatory Visit (HOSPITAL_COMMUNITY)
Admission: RE | Admit: 2011-09-17 | Discharge: 2011-09-17 | Disposition: A | Discharge status: Home / Self Care | Payer: BC Managed Care – PPO | Source: Ambulatory Visit | Attending: Family Medicine | Admitting: Family Medicine

## 2011-09-17 DIAGNOSIS — R197 Diarrhea, unspecified: Secondary | ICD-10-CM | POA: Insufficient documentation

## 2011-09-17 DIAGNOSIS — R109 Unspecified abdominal pain: Secondary | ICD-10-CM

## 2011-12-02 ENCOUNTER — Other Ambulatory Visit: Payer: Self-pay | Admitting: Internal Medicine

## 2011-12-02 DIAGNOSIS — Z1231 Encounter for screening mammogram for malignant neoplasm of breast: Secondary | ICD-10-CM

## 2011-12-14 ENCOUNTER — Ambulatory Visit: Payer: BC Managed Care – PPO | Attending: Gynecology | Admitting: Gynecology

## 2011-12-14 ENCOUNTER — Encounter: Payer: Self-pay | Admitting: Gynecology

## 2011-12-14 VITALS — BP 150/84 | HR 80 | Temp 98.3°F | Resp 20 | Ht 64.0 in | Wt 243.0 lb

## 2011-12-14 DIAGNOSIS — C549 Malignant neoplasm of corpus uteri, unspecified: Secondary | ICD-10-CM | POA: Insufficient documentation

## 2011-12-14 DIAGNOSIS — C541 Malignant neoplasm of endometrium: Secondary | ICD-10-CM

## 2011-12-14 NOTE — Progress Notes (Signed)
Consult Note: Gyn-Onc   Melissa Velazquez 61 y.o. female  Chief Complaint  Patient presents with  . Endometrial cancer    Follow up    Interval History: The patient returns today for scheduled followup. Since her last visit she's done well. She denies any GI or GU symptoms has no pelvic pain pressure vaginal bleeding or discharge. Her functional status is good.  HPI: Patient underwent initial surgery in 2000 for a stage I grade 1 endometrial cancer. In 2008 she had a recurrence on the right pelvic sidewall which was treated with adjuvant radiation therapy and 5 years of oral Megace. The tumor was ER PR positive. Over the past 5 years she been followed no evidence recurrent disease.  Review of Systems:10 point review of systems is negative as noted above.   Vitals: Blood pressure 150/84, pulse 80, temperature 98.3 F (36.8 C), temperature source Oral, resp. rate 20, height 5\' 4"  (1.626 m), weight 243 lb (110.224 kg).  Physical Exam: General : The patient is a healthy woman in no acute distress.  HEENT: normocephalic, extraoccular movements normal; neck is supple without thyromegally  Lynphnodes: Supraclavicular and inguinal nodes not enlarged  Abdomen: Soft, non-tender, no ascites, no organomegally, no masses, no hernias  Pelvic:  EGBUS: Normal female  Vagina: Normal, no lesions  Urethra and Bladder: Normal, non-tender  Cervix: Surgically absent  Uterus: Surgically absent  Bi-manual examination: Non-tender; no adenxal masses or nodularity  Rectal: normal sphincter tone, no masses, no blood  Lower extremities: No edema or varicosities. Normal range of motion    Assessment/Plan: Endometrial cancer initially diagnosed and treated in 2000. She had a recurrence in 2008 treated with radiation therapy and 5 years of oral Megace. The patient is clinically free of disease.  At this juncture I believe it is reasonable to discontinue Megace and return her to her primary care gynecologist for  annual exams.  Allergies  Allergen Reactions  . Levofloxacin     REACTION: Stomach upset    Past Medical History  Diagnosis Date  . Thyroid nodule   . Diverticular disease   . Hyperglycemia   . Sebaceous cyst   . Obesity   . Hypertension   . Endometrial cancer 2001    spread to appendix   . GERD (gastroesophageal reflux disease)   . Depression   . Asthma   . Allergic rhinitis   . Arthritis   . Anal fissure     Hx of   . Thyroid disease   . 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  . Hemorrhoids     Past Surgical History  Procedure Date  . Total abdominal hysterectomy 2001  . Pelvic fracture surgery 2008    repeat for uterine cancer mets  . Wisdom tooth extraction   . Cholecystectomy 2008  . Appendectomy 2008    Current Outpatient Prescriptions  Medication Sig Dispense Refill  . levothyroxine (SYNTHROID, LEVOTHROID) 75 MCG tablet Take 75 mcg by mouth daily.        Marland Kitchen lisinopril (PRINIVIL,ZESTRIL) 5 MG tablet Take 5 mg by mouth daily.        . megestrol (MEGACE) 40 MG/ML suspension Take by mouth 3 (three) times daily.       . metFORMIN (GLUCOPHAGE) 500 MG tablet Take 500 mg by mouth daily with breakfast.        . sertraline (ZOLOFT) 100 MG tablet Take 100 mg by mouth. Takes 150 mg once daily       . Na Sulfate-K  Sulfate-Mg Sulf (SUPREP BOWEL PREP) SOLN Take 1 kit by mouth as directed.  177 mL  0    History   Social History  . Marital Status: Single    Spouse Name: N/A    Number of Children: 0  . Years of Education: N/A   Occupational History  . Psychologist    Social History Main Topics  . Smoking status: Former Games developer  . Smokeless tobacco: Never Used  . Alcohol Use: Yes     Comment: rare  . Drug Use: No  . Sexually Active: Not on file   Other Topics Concern  . Not on file   Social History Narrative   2 cups of caffeine daily     Family History  Problem Relation Age of Onset  . Breast cancer Mother   . Obesity Sister   . Colon cancer Neg  Hx   . Diabetes Mother       Jeannette Corpus, MD 12/14/2011, 2:25 PM

## 2011-12-14 NOTE — Patient Instructions (Signed)
You should return to have annual exams by her primary gynecologist.

## 2012-02-16 ENCOUNTER — Other Ambulatory Visit (HOSPITAL_COMMUNITY): Payer: Self-pay | Admitting: Internal Medicine

## 2012-02-16 DIAGNOSIS — Z139 Encounter for screening, unspecified: Secondary | ICD-10-CM

## 2012-02-18 ENCOUNTER — Ambulatory Visit (HOSPITAL_COMMUNITY): Payer: BC Managed Care – PPO

## 2012-02-23 ENCOUNTER — Other Ambulatory Visit: Payer: Self-pay | Admitting: Internal Medicine

## 2012-02-23 ENCOUNTER — Ambulatory Visit (HOSPITAL_COMMUNITY): Payer: BC Managed Care – PPO

## 2012-03-05 ENCOUNTER — Other Ambulatory Visit: Payer: Self-pay

## 2012-05-18 ENCOUNTER — Ambulatory Visit (HOSPITAL_COMMUNITY)
Admission: RE | Admit: 2012-05-18 | Discharge: 2012-05-18 | Disposition: A | Discharge status: Home / Self Care | Payer: BC Managed Care – PPO | Source: Ambulatory Visit | Attending: Physician Assistant | Admitting: Physician Assistant

## 2012-05-18 ENCOUNTER — Other Ambulatory Visit (HOSPITAL_COMMUNITY): Payer: Self-pay | Admitting: Physician Assistant

## 2012-05-18 DIAGNOSIS — M79609 Pain in unspecified limb: Secondary | ICD-10-CM | POA: Insufficient documentation

## 2012-05-18 DIAGNOSIS — M25562 Pain in left knee: Secondary | ICD-10-CM

## 2012-05-18 DIAGNOSIS — R229 Localized swelling, mass and lump, unspecified: Secondary | ICD-10-CM | POA: Insufficient documentation

## 2012-10-04 ENCOUNTER — Encounter (INDEPENDENT_AMBULATORY_CARE_PROVIDER_SITE_OTHER): Payer: Self-pay | Admitting: *Deleted

## 2012-10-12 ENCOUNTER — Encounter (INDEPENDENT_AMBULATORY_CARE_PROVIDER_SITE_OTHER): Payer: Self-pay | Admitting: *Deleted

## 2012-10-12 ENCOUNTER — Other Ambulatory Visit (INDEPENDENT_AMBULATORY_CARE_PROVIDER_SITE_OTHER): Payer: Self-pay | Admitting: *Deleted

## 2012-10-12 DIAGNOSIS — Z86010 Personal history of colonic polyps: Secondary | ICD-10-CM

## 2012-10-12 NOTE — Telephone Encounter (Signed)
This encounter was created in error - please disregard.

## 2012-10-18 ENCOUNTER — Telehealth (INDEPENDENT_AMBULATORY_CARE_PROVIDER_SITE_OTHER): Payer: Self-pay | Admitting: *Deleted

## 2012-10-18 DIAGNOSIS — Z1211 Encounter for screening for malignant neoplasm of colon: Secondary | ICD-10-CM

## 2012-10-18 NOTE — Telephone Encounter (Signed)
Patient needs osmo pill prep 

## 2012-10-21 MED ORDER — SOD PHOS MONO-SOD PHOS DIBASIC 1.102-0.398 G PO TABS: 32.0000 | ORAL_TABLET | Freq: Once | ORAL | Status: DC

## 2012-11-03 ENCOUNTER — Telehealth (INDEPENDENT_AMBULATORY_CARE_PROVIDER_SITE_OTHER): Payer: Self-pay | Admitting: *Deleted

## 2012-11-03 NOTE — Telephone Encounter (Signed)
  Procedure: tcs  Reason/Indication:  Hx polyps  Has patient had this procedure before?  Yes, 7 yrs ago  If so, when, by whom and where?    Is there a family history of colon cancer?  no  Who?  What age when diagnosed?    Is patient diabetic?   borderline      Does patient have prosthetic heart valve?  no  Do you have a pacemaker?  no  Has patient ever had endocarditis? no  Has patient had joint replacement within last 12 months?  no  Does patient tend to be constipated or take laxatives? ocassionally  Is patient on Coumadin, Plavix and/or Aspirin? no  Medications: aleve prn, zoloft 150 mg daily, metformin 500 mg daily, lisinopril 5 mg daily, levothyroxine 75 mg daily, vit b12 otc  Allergies: triple antibiotic ointment, levaquin, steri-strips  Medication Adjustment:   Procedure date & time: 11/24/12 at 830

## 2012-11-03 NOTE — Telephone Encounter (Signed)
agree

## 2012-11-11 ENCOUNTER — Encounter (HOSPITAL_COMMUNITY): Payer: Self-pay | Admitting: Pharmacy Technician

## 2012-11-24 ENCOUNTER — Ambulatory Visit (HOSPITAL_COMMUNITY)
Admission: RE | Admit: 2012-11-24 | Discharge: 2012-11-24 | Disposition: A | Discharge status: Home / Self Care | Payer: BC Managed Care – PPO | Source: Ambulatory Visit | Attending: Internal Medicine | Admitting: Internal Medicine

## 2012-11-24 ENCOUNTER — Other Ambulatory Visit: Payer: Self-pay

## 2012-11-24 ENCOUNTER — Encounter (HOSPITAL_COMMUNITY)
Admission: RE | Disposition: A | Discharge status: Home / Self Care | Payer: Self-pay | Source: Ambulatory Visit | Attending: Internal Medicine

## 2012-11-24 ENCOUNTER — Encounter (HOSPITAL_COMMUNITY): Payer: Self-pay | Admitting: *Deleted

## 2012-11-24 DIAGNOSIS — D126 Benign neoplasm of colon, unspecified: Secondary | ICD-10-CM | POA: Insufficient documentation

## 2012-11-24 DIAGNOSIS — K5730 Diverticulosis of large intestine without perforation or abscess without bleeding: Secondary | ICD-10-CM

## 2012-11-24 DIAGNOSIS — Z860100 Personal history of colon polyps, unspecified: Secondary | ICD-10-CM | POA: Insufficient documentation

## 2012-11-24 DIAGNOSIS — K644 Residual hemorrhoidal skin tags: Secondary | ICD-10-CM | POA: Insufficient documentation

## 2012-11-24 DIAGNOSIS — Z01812 Encounter for preprocedural laboratory examination: Secondary | ICD-10-CM | POA: Insufficient documentation

## 2012-11-24 DIAGNOSIS — I10 Essential (primary) hypertension: Secondary | ICD-10-CM | POA: Insufficient documentation

## 2012-11-24 DIAGNOSIS — Z79899 Other long term (current) drug therapy: Secondary | ICD-10-CM | POA: Insufficient documentation

## 2012-11-24 HISTORY — PX: COLONOSCOPY: SHX5424

## 2012-11-24 LAB — GLUCOSE, CAPILLARY: Glucose-Capillary: 81 mg/dL (ref 70–99)

## 2012-11-24 SURGERY — COLONOSCOPY
Anesthesia: Moderate Sedation

## 2012-11-24 MED ORDER — MIDAZOLAM HCL 5 MG/5ML IJ SOLN
INTRAMUSCULAR | Status: AC
  Filled 2012-11-24: qty 10

## 2012-11-24 MED ORDER — STERILE WATER FOR IRRIGATION IR SOLN
Status: DC | PRN
  Administered 2012-11-24: 09:00:00

## 2012-11-24 MED ORDER — SODIUM CHLORIDE 0.9 % IV SOLN
INTRAVENOUS | Status: DC
  Administered 2012-11-24: 08:00:00 via INTRAVENOUS

## 2012-11-24 MED ORDER — MEPERIDINE HCL 50 MG/ML IJ SOLN
INTRAMUSCULAR | Status: DC | PRN
  Administered 2012-11-24 (×2): 25 mg via INTRAVENOUS

## 2012-11-24 MED ORDER — MIDAZOLAM HCL 5 MG/5ML IJ SOLN
INTRAMUSCULAR | Status: DC | PRN
  Administered 2012-11-24 (×2): 1 mg via INTRAVENOUS
  Administered 2012-11-24 (×2): 2 mg via INTRAVENOUS

## 2012-11-24 MED ORDER — MEPERIDINE HCL 50 MG/ML IJ SOLN
INTRAMUSCULAR | Status: AC
  Filled 2012-11-24: qty 1

## 2012-11-24 NOTE — H&P (Signed)
Melissa Velazquez is an 62 y.o. female.   Chief Complaint: Patient is here for colonoscopy. HPI: Patient is 62 year old Caucasian female with history of colonic adenomas and is here for surveillance colonoscopy. She had tubulovillous adenoma high-grade dysphagia removed in 2006 and she only had 2 mm polyp in 2009. Both of these exams were performed by Dr. Newell Coral of [facility]. Patient denies abdominal pain change in bowel habits or rectal bleeding. Family history is negative for CRC.  Past Medical History  Diagnosis Date  . Thyroid nodule   . Diverticular disease   . Sebaceous cyst   . Obesity   . Hypertension   . Endometrial cancer 2001    spread to appendix   . GERD (gastroesophageal reflux disease)   . Depression   . Asthma   . Allergic rhinitis   . Arthritis   . Anal fissure     Hx of   . Thyroid disease   . 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  . Hemorrhoids   . Hyperglycemia     Past Surgical History  Procedure Laterality Date  . Total abdominal hysterectomy  2001  . Wisdom tooth extraction    . Cholecystectomy  2008  . Appendectomy  2008    Family History  Problem Relation Age of Onset  . Breast cancer Mother   . Obesity Sister   . Colon cancer Neg Hx   . Diabetes Mother    Social History:  reports that she has quit smoking. Her smoking use included Cigarettes. She has a 19 pack-year smoking history. She has never used smokeless tobacco. She reports that she drinks alcohol. She reports that she does not use illicit drugs.  Allergies:  Allergies  Allergen Reactions  . Triple Antibiotic [Bacitracin-Neomycin-Polymyxin] Anaphylaxis  . Levofloxacin     REACTION: Stomach upset    Medications Prior to Admission  Medication Sig Dispense Refill  . levothyroxine (SYNTHROID, LEVOTHROID) 75 MCG tablet Take 75 mcg by mouth daily.        Marland Kitchen lisinopril (PRINIVIL,ZESTRIL) 5 MG tablet Take 5 mg by mouth daily.        . metFORMIN (GLUCOPHAGE) 500  MG tablet Take 500 mg by mouth daily with breakfast.        . sertraline (ZOLOFT) 100 MG tablet Take 150 mg by mouth daily. Takes 150 mg once daily      . sodium phosphates (OSMOPREP) 1.102-0.398 G TABS Take 32 tablets by mouth once.  32 tablet  0    Results for orders placed during the hospital encounter of 11/24/12 (from the past 48 hour(s))  GLUCOSE, CAPILLARY     Status: None   Collection Time    11/24/12  7:45 AM      Result Value Range   Glucose-Capillary 81  70 - 99 mg/dL   No results found.  ROS  Blood pressure 124/75, pulse 76, temperature 97.6 F (36.4 C), temperature source Oral, resp. rate 18, height 5\' 4"  (1.626 m), weight 230 lb (104.327 kg), SpO2 95.00%. Physical Exam  Constitutional: She appears well-developed and well-nourished.  HENT:  Mouth/Throat: Oropharynx is clear and moist.  Eyes: Conjunctivae are normal. No scleral icterus.  Neck: No thyromegaly present.  Cardiovascular: Normal rate, regular rhythm and normal heart sounds.   No murmur heard. Respiratory: Effort normal and breath sounds normal.  GI: Soft. She exhibits no distension and no mass. There is no tenderness.  Musculoskeletal: She exhibits no edema.  Lymphadenopathy:    She  has no cervical adenopathy.  Neurological: She is alert.  Skin: Skin is warm and dry.     Assessment/Plan History of colonic adenomas. Surveillance colonoscopy.  Thara Searing U 11/24/2012, 8:35 AM

## 2012-11-24 NOTE — Op Note (Signed)
COLONOSCOPY PROCEDURE REPORT  PATIENT:  Melissa Velazquez  MR#:  [id number] Birthdate:  January 09, 1951, 62 y.o., female Endoscopist:  Dr. Malissa Hippo, MD Referred By:  Dr. Madelin Rear. Sherwood Gambler, MD  Procedure Date: 11/24/2012  Procedure:   Colonoscopy with snare polypectomy.  Indications:  Patient is 62 year old Caucasian female with history of colonic adenomas who is undergoing surveillance colonoscopy. Her last colonoscopy was in 2009 with removal of small polyp in 2006 she had a tubulovillous adenoma with high-grade dysplasia.  Informed Consent:  The procedure and risks were reviewed with the patient and informed consent was obtained.  Medications:  Demerol 50 mg IV Versed 6 mg IV  Description of procedure:  After a digital rectal exam was performed, that colonoscope was advanced from the anus through the rectum and colon to the area of the cecum, ileocecal valve and appendiceal orifice. The cecum was deeply intubated. These structures were well-seen and photographed for the record. From the level of the cecum and ileocecal valve, the scope was slowly and cautiously withdrawn. The mucosal surfaces were carefully surveyed utilizing scope tip to flexion to facilitate fold flattening as needed. The scope was pulled down into the rectum where a thorough exam including retroflexion was performed.  Findings:   Prep satisfactory. 10 mm sessile polyp snared from ascending colon. Small polyp was cold snared from descending colon and residual polyp ablated with snare tip. 2 small polyps at descending colon were coagulated. Two small polyps from sigmoid colon were snared(one half snared and one cold snared). Scattered diverticula at sigmoid colon. Small hemorrhoids below the dentate line.   Therapeutic/Diagnostic Maneuvers Performed:  See above  Complications:  None  Cecal Withdrawal Time:  25 minutes  Impression:  Examination performed to cecum. 10 mm polyp snared from ascending colon. Three  small polyps were submitted together(1 at descending colon and 2 at sigmoid colon; one polyp was hot snared and others to cold snared). Two small polyps at descending colon were coagulated. Mild sigmoid colon diverticulosis. Small external hemorrhoids  Recommendations:  Standard instructions given. I will contact patient with biopsy results and further recommendations.  Memori Sammon U  11/24/2012 9:24 AM  CC: Dr. Cassell Smiles., MD & Dr. Bonnetta Barry ref. provider found

## 2012-11-29 ENCOUNTER — Encounter (HOSPITAL_COMMUNITY): Payer: Self-pay | Admitting: Internal Medicine

## 2012-11-30 ENCOUNTER — Encounter (INDEPENDENT_AMBULATORY_CARE_PROVIDER_SITE_OTHER): Payer: Self-pay | Admitting: *Deleted

## 2013-05-03 ENCOUNTER — Other Ambulatory Visit (HOSPITAL_COMMUNITY): Payer: Self-pay | Admitting: Family Medicine

## 2013-05-03 DIAGNOSIS — E041 Nontoxic single thyroid nodule: Secondary | ICD-10-CM

## 2013-05-03 DIAGNOSIS — E039 Hypothyroidism, unspecified: Secondary | ICD-10-CM

## 2013-05-11 ENCOUNTER — Ambulatory Visit (HOSPITAL_COMMUNITY): Payer: BC Managed Care – PPO | Attending: Family Medicine

## 2014-05-05 ENCOUNTER — Emergency Department (HOSPITAL_COMMUNITY)
Admission: EM | Admit: 2014-05-05 | Discharge: 2014-05-05 | Disposition: A | Discharge status: Home / Self Care | Payer: BLUE CROSS/BLUE SHIELD | Attending: Emergency Medicine | Admitting: Emergency Medicine

## 2014-05-05 ENCOUNTER — Emergency Department (HOSPITAL_COMMUNITY): Payer: BLUE CROSS/BLUE SHIELD

## 2014-05-05 ENCOUNTER — Encounter (HOSPITAL_COMMUNITY): Payer: Self-pay | Admitting: *Deleted

## 2014-05-05 DIAGNOSIS — E669 Obesity, unspecified: Secondary | ICD-10-CM | POA: Diagnosis not present

## 2014-05-05 DIAGNOSIS — Z87891 Personal history of nicotine dependence: Secondary | ICD-10-CM | POA: Diagnosis not present

## 2014-05-05 DIAGNOSIS — Z86010 Personal history of colonic polyps: Secondary | ICD-10-CM | POA: Diagnosis not present

## 2014-05-05 DIAGNOSIS — E039 Hypothyroidism, unspecified: Secondary | ICD-10-CM | POA: Diagnosis not present

## 2014-05-05 DIAGNOSIS — E119 Type 2 diabetes mellitus without complications: Secondary | ICD-10-CM | POA: Insufficient documentation

## 2014-05-05 DIAGNOSIS — J45901 Unspecified asthma with (acute) exacerbation: Secondary | ICD-10-CM | POA: Insufficient documentation

## 2014-05-05 DIAGNOSIS — Z8541 Personal history of malignant neoplasm of cervix uteri: Secondary | ICD-10-CM | POA: Diagnosis not present

## 2014-05-05 DIAGNOSIS — Z8739 Personal history of other diseases of the musculoskeletal system and connective tissue: Secondary | ICD-10-CM | POA: Diagnosis not present

## 2014-05-05 DIAGNOSIS — Z8719 Personal history of other diseases of the digestive system: Secondary | ICD-10-CM | POA: Diagnosis not present

## 2014-05-05 DIAGNOSIS — Z79899 Other long term (current) drug therapy: Secondary | ICD-10-CM | POA: Insufficient documentation

## 2014-05-05 DIAGNOSIS — F329 Major depressive disorder, single episode, unspecified: Secondary | ICD-10-CM | POA: Insufficient documentation

## 2014-05-05 DIAGNOSIS — E041 Nontoxic single thyroid nodule: Secondary | ICD-10-CM | POA: Diagnosis not present

## 2014-05-05 DIAGNOSIS — Z872 Personal history of diseases of the skin and subcutaneous tissue: Secondary | ICD-10-CM | POA: Insufficient documentation

## 2014-05-05 DIAGNOSIS — R0602 Shortness of breath: Secondary | ICD-10-CM | POA: Diagnosis present

## 2014-05-05 DIAGNOSIS — I10 Essential (primary) hypertension: Secondary | ICD-10-CM | POA: Diagnosis not present

## 2014-05-05 HISTORY — DX: Type 2 diabetes mellitus without complications: E11.9

## 2014-05-05 MED ORDER — IPRATROPIUM-ALBUTEROL 0.5-2.5 (3) MG/3ML IN SOLN
3.0000 mL | Freq: Once | RESPIRATORY_TRACT | Status: AC
  Administered 2014-05-05: 3 mL via RESPIRATORY_TRACT
  Filled 2014-05-05: qty 3

## 2014-05-05 MED ORDER — ALBUTEROL SULFATE HFA 108 (90 BASE) MCG/ACT IN AERS
INHALATION_SPRAY | RESPIRATORY_TRACT | Status: AC
  Administered 2014-05-05: 04:00:00
  Filled 2014-05-05: qty 6.7

## 2014-05-05 MED ORDER — AEROCHAMBER PLUS W/MASK MISC
1.0000 | Freq: Once | Status: AC
  Administered 2014-05-05: 1

## 2014-05-05 NOTE — ED Notes (Signed)
Pt reports having SOB & cough. Says inhaler is not working. Pt says has been treated for bronchitis & sinus infection over the past month.

## 2014-05-05 NOTE — ED Notes (Signed)
Pt alert & oriented x4, stable gait. Patient  given discharge instructions, paperwork & prescription(s). Registration met pt to complete paperwork. Patient verbalized understanding. Pt left department w/ no further questions. 

## 2014-05-05 NOTE — ED Provider Notes (Signed)
CSN: [id number]     Arrival date & time 05/05/14  0220 History   First MD Initiated Contact with Patient 05/05/14 [phone]     Chief Complaint  Patient presents with  . Shortness of Breath     (Consider location/radiation/quality/duration/timing/severity/associated sxs/prior Treatment) HPI  This is a 64 year old female with a history of asthma. She has had an exacerbation of her asthma for about the past month was associated nasal congestion and sinus drainage. Despite using her albuterol inhaler her breathing became severe enough that she needed to come to the ED this morning. She was noted to be wheezing on arrival with difficulty speaking due to constant coughing. She was given an albuterol and Atrovent neb treatment with significant improvement in her symptoms. She denies fever. Her cough has been nonproductive. She denies nausea, vomiting or diarrhea. She has been on antibiotics recently and is on nystatin for oral thrush resulting from antibiotic therapy.  Past Medical History  Diagnosis Date  . Thyroid nodule   . Diverticular disease   . Sebaceous cyst   . Obesity   . Hypertension   . Endometrial cancer 2001    spread to appendix   . GERD (gastroesophageal reflux disease)   . Depression   . Asthma   . Allergic rhinitis   . Arthritis   . Anal fissure     Hx of   . Thyroid disease   . 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  . Hemorrhoids   . Hyperglycemia   . Diabetes mellitus without complication    Past Surgical History  Procedure Laterality Date  . Total abdominal hysterectomy  2001  . Wisdom tooth extraction    . Cholecystectomy  2008  . Appendectomy  2008  . Colonoscopy N/A 11/24/2012    Procedure: COLONOSCOPY;  Surgeon: Rogene Houston, MD;  [facility]: [facility];  Service: Endoscopy;  Laterality: N/A;  68   Family History  Problem Relation Age of Onset  . Breast cancer Mother   . Obesity Sister   . Colon cancer Neg Hx   . Diabetes Mother    History   Substance Use Topics  . Smoking status: Former Smoker -- 1.00 packs/day for 19 years    Types: Cigarettes  . Smokeless tobacco: Never Used  . Alcohol Use: Yes     Comment: rare   OB History    No data available     Review of Systems  All other systems reviewed and are negative.   Allergies  Triple antibiotic and Levofloxacin  Home Medications   Prior to Admission medications   Medication Sig Start Date End Date Taking? Authorizing Provider  levothyroxine (SYNTHROID, LEVOTHROID) 75 MCG tablet Take 75 mcg by mouth daily.     Yes Historical Provider, MD  lisinopril (PRINIVIL,ZESTRIL) 5 MG tablet Take 5 mg by mouth daily.     Yes Historical Provider, MD  metFORMIN (GLUCOPHAGE) 500 MG tablet Take 500 mg by mouth daily with breakfast.     Yes Historical Provider, MD  sertraline (ZOLOFT) 100 MG tablet Take 150 mg by mouth daily. Takes 150 mg once daily   Yes Historical Provider, MD   BP 131/99 mmHg  Pulse 77  Temp(Src) 99 F (37.2 C) (Oral)  Resp 20  Ht 5\' 4"  (1.626 m)  Wt 230 lb (104.327 kg)  BMI 39.46 kg/m2  SpO2 94%   Physical Exam  General: Well-developed, well-nourished female in no acute distress; appearance consistent with age of record HENT: normocephalic; atraumatic  Eyes: pupils equal, round and reactive to light; extraocular muscles intact Neck: supple Heart: regular rate and rhythm Lungs: clear to auscultation bilaterally (examined after neb treatment) Abdomen: soft; nondistended; nontender; no masses or hepatosplenomegaly; bowel sounds present Extremities: No deformity; full range of motion; pulses normal Neurologic: Awake, alert and oriented; motor function intact in all extremities and symmetric; no facial droop Skin: Warm and dry Psychiatric: Normal mood and affect    ED Course  Procedures (including critical care time)   MDM  Nursing notes and vitals signs, including pulse oximetry, reviewed.  Summary of this visit's results, reviewed by  myself:  Imaging Studies: Dg Chest 2 View  05/05/2014   CLINICAL DATA:  Shortness of breath and cough. Inhaler is not working  EXAM: CHEST  2 VIEW  COMPARISON:  10/13/2007  FINDINGS: There is no edema, consolidation, effusion, or pneumothorax. Normal heart size and aortic contours. Cholecystectomy. No acute osseous findings.  IMPRESSION: No active cardiopulmonary disease.   Electronically Signed   By: Monte Fantasia M.D.   On: 05/05/2014 03:53   4:04 AM We will provide an Aerochamber and instruction in its proper use.    Shanon Rosser, MD 05/05/14 [phone]

## 2014-06-25 ENCOUNTER — Institutional Professional Consult (permissible substitution): Payer: BLUE CROSS/BLUE SHIELD | Admitting: Internal Medicine

## 2014-07-06 ENCOUNTER — Encounter: Payer: Self-pay | Admitting: Internal Medicine

## 2014-07-06 ENCOUNTER — Ambulatory Visit (INDEPENDENT_AMBULATORY_CARE_PROVIDER_SITE_OTHER): Payer: BLUE CROSS/BLUE SHIELD | Admitting: Internal Medicine

## 2014-07-06 VITALS — BP 120/72 | HR 68 | Ht 64.0 in | Wt 225.2 lb

## 2014-07-06 DIAGNOSIS — R05 Cough: Secondary | ICD-10-CM | POA: Diagnosis not present

## 2014-07-06 DIAGNOSIS — R058 Other specified cough: Secondary | ICD-10-CM | POA: Insufficient documentation

## 2014-07-06 DIAGNOSIS — I10 Essential (primary) hypertension: Secondary | ICD-10-CM

## 2014-07-06 MED ORDER — AZELASTINE-FLUTICASONE 137-50 MCG/ACT NA SUSP: 1.0000 | Freq: Two times a day (BID) | NASAL | Status: DC

## 2014-07-06 MED ORDER — VALSARTAN 80 MG PO TABS: 80.0000 mg | ORAL_TABLET | Freq: Every day | ORAL | Status: DC

## 2014-07-06 NOTE — Assessment & Plan Note (Signed)
ACE inhibitors are problematic in  pts with airway complaints because  even experienced pulmonologists can't always distinguish ace effects from copd/asthma/pnds/ allergies etc.  By themselves they don't actually cause a problem, much like oxygen can't by itself start a fire, but they certainly serve as a powerful catalyst or enhancer for any "fire"  or inflammatory process in the upper airway, be it caused by an ET  tube or more commonly reflux (especially in the obese or pts with known GERD or who are on biphoshonates) or URI's, due to interference with bradykinin clearance.  The effects of acei on bradykinin levels occurs in 100% of pt's on acei (unless they surreptitiously stop the med!) but the classic cough is only reported in 5%.  This leaves 95% of pts on acei's  with a variety of syndromes including no identifiable symptom in most  vs non-specific symptoms that wax and wane depending on what other insult is occuring at the level of the upper airway/ in this case related to chronic rhinitis  Try diovan 160 mg x 4 weeks > it's the only way to sort this out, f/u With Dr Hilma Favors if better, here in pulmonary clinic if not.

## 2014-07-06 NOTE — Progress Notes (Signed)
Subjective:    Patient ID: Melissa Velazquez, female    DOB: 13-Jun-1950,    MRN: [id number]  HPI  64 yowf quit smoking mid 90's with background seasonal rhinitis as child age [age] then recurrent bronchitis in 40s while smoking got better p quit smoking then developed asthma ? After stopped using just prns eval by Kozlow pos allergies but no f/u since then referred to pulmonary clinic 07/06/2014 by Dr [name] with refractory cough and sense of chest congestion.    07/06/2014 1st [place] [facility] office visit/ Lennart Gladish / on acei Chief Complaint  Patient presents with  . Pulmonary Consult    Referred by Dr. [name]. Pt states had "asthma", congestion and wheezing in April 2016- took pred and symptoms resolved and then came back once finished with pred. She only c/o nasal congestion today.    nose always stuffy / drippy x 5 y had things under control until spring of 2016 much worse since onset of severe cough  with ER x one ,  primary care x 4 , mutilple abx  Better p prednisone / worse p grass ? Better on BREO but did not stay on it. Cough mostly dry/ congestion mostly daytime.   No obvious day to day or daytime variabilty or assoc  cp or chest tightness, subjective wheeze overt   hb symptoms. No unusual exp hx or h/o childhood pna/ asthma or knowledge of premature birth.  Sleeping ok without nocturnal  or early am exacerbation  of respiratory  c/o's or need for noct saba. Also denies any obvious fluctuation of symptoms with weather or environmental changes or other aggravating or alleviating factors except as outlined above   Current Medications, Allergies, Complete Past Medical History, Past Surgical History, Family History, and Social History were reviewed in [employer] record.             Review of Systems  Constitutional: Negative for fever, chills and unexpected weight change.  HENT: Positive for congestion. Negative for dental problem, ear pain,  nosebleeds, postnasal drip, rhinorrhea, sinus pressure, sneezing, sore throat, trouble swallowing and voice change.   Eyes: Negative for visual disturbance.  Respiratory: Negative for cough, choking and shortness of breath.   Cardiovascular: Negative for chest pain and leg swelling.  Gastrointestinal: Negative for vomiting, abdominal pain and diarrhea.  Genitourinary: Negative for difficulty urinating.  Musculoskeletal: Negative for arthralgias.  Skin: Negative for rash.  Neurological: Negative for tremors, syncope and headaches.  Hematological: Does not bruise/bleed easily.       Objective:   Physical Exam  amb wf nad   Wt Readings from Last 3 Encounters:  07/06/14 225 lb 3.2 oz (102.15 kg)  05/05/14 230 lb (104.327 kg)  11/24/12 230 lb (104.327 kg)    Vital signs reviewed  HEENT: nl dentition, turbinates, and orophanx. Nl external ear canals without cough reflex   NECK :  without JVD/Nodes/TM/ nl carotid upstrokes bilaterally   LUNGS: no acc muscle use, clear to A and P bilaterally without cough on insp or exp maneuvers   CV:  RRR  no s3 or murmur or increase in P2, no edema   ABD:  soft and nontender with nl excursion in the supine position. No bruits or organomegaly, bowel sounds nl  MS:  warm without deformities, calf tenderness, cyanosis or clubbing  SKIN: warm and dry without lesions    NEURO:  alert, approp, no deficits   I personally reviewed images and agree with radiology  impression as follows:  CXR:  05/05/14 No active cardiopulmonary disease.         Assessment & Plan:

## 2014-07-06 NOTE — Assessment & Plan Note (Addendum)
The most common causes of chronic cough in immunocompetent adults include the following: upper airway cough syndrome (UACS), previously referred to as postnasal drip syndrome (PNDS), which is caused by variety of rhinosinus conditions; (2) asthma; (3) GERD; (4) chronic bronchitis from cigarette smoking or other inhaled environmental irritants; (5) nonasthmatic eosinophilic bronchitis; and (6) bronchiectasis.   These conditions, singly or in combination, have accounted for up to 94% of the causes of chronic cough in prospective studies.   Other conditions have constituted no >6% of the causes in prospective studies These have included bronchogenic carcinoma, chronic interstitial pneumonia, sarcoidosis, left ventricular failure, ACEI-induced cough, and aspiration from a condition associated with pharyngeal dysfunction.    Chronic cough is often simultaneously caused by more than one condition. A single cause has been found from 38 to 82% of the time, multiple causes from 18 to 62%. Multiply caused cough has been the result of three diseases up to 42% of the time.       Based on hx and exam, this is most likely:  Classic Upper airway cough syndrome, so named because it's frequently impossible to sort out how much is  CR/sinusitis with freq throat clearing (which can be related to primary GERD)   vs  causing  secondary (" extra esophageal")  GERD from wide swings in gastric pressure that occur with throat clearing, often  promoting self use of mint and menthol lozenges that reduce the lower esophageal sphincter tone and exacerbate the problem further in a cyclical fashion.   These are the same pts (now being labeled as having "irritable larynx syndrome" by some cough centers) who not infrequently have a history of having failed to tolerate ace inhibitors,  dry powder inhalers or biphosphonates or report having atypical reflux symptoms that don't respond to standard doses of PPI , and are easily confused as  having aecopd or asthma flares by even experienced allergists/ pulmonologists.   The first step is to   eliminate acei and treat rhinitis with trial of dymista  then regroup if the cough persists.  I had an extended discussion with the patient reviewing all relevant studies completed to date x 60m face to face including: Each maintenance medication was reviewed in detail including most importantly the difference between maintenance and as needed and under what circumstances the prns are to be used.  Please see instructions for details which were reviewed in writing and the patient given a copy.  t

## 2014-07-06 NOTE — Patient Instructions (Addendum)
Stop lisinopril  Start diovan 80 mg one daily in its place and many of your symptoms should improve over the next 6 weeks    nasal steroids (like dymista) have no immediate benefit in terms of improving symptoms.  To help them reached the target tissue, the patient should use Afrin two puffs every 12 hours applied one min before using the nasal steroids.  Afrin should be stopped after no more than 5 days.  If the symptoms worsen, Afrin can be restarted after 5 days off of therapy to prevent rebound congestion from overuse of Afrin.   in no way are nasal steroids a concern in terms of "addiction".   dymista one twice daily    If you are satisfied with your treatment plan,  let your doctor know and he/she can either refill your medications or you can return here when your prescription runs out.     If in any way you are not 100% satisfied,  please tell us.  If 100% better, tell your friends!  Pulmonary follow up is as needed

## 2014-07-17 ENCOUNTER — Telehealth: Payer: Self-pay | Admitting: Internal Medicine

## 2014-07-17 MED ORDER — AZELASTINE-FLUTICASONE 137-50 MCG/ACT NA SUSP: 1.0000 | Freq: Two times a day (BID) | NASAL | Status: DC

## 2014-07-17 NOTE — Telephone Encounter (Signed)
Patient was given sample of Dymista at last OV and was told to call to get refill if Melissa Velazquez is working.  Patient says that the Dymista works fine, she is still a little stuffy, but wants to continue using it. Rx sent for Dymista. Patient notified. Nothing further needed.

## 2014-11-29 ENCOUNTER — Other Ambulatory Visit (HOSPITAL_COMMUNITY): Payer: Self-pay | Admitting: Family Medicine

## 2014-11-29 DIAGNOSIS — Z1231 Encounter for screening mammogram for malignant neoplasm of breast: Secondary | ICD-10-CM

## 2014-12-17 ENCOUNTER — Ambulatory Visit (HOSPITAL_COMMUNITY): Payer: BLUE CROSS/BLUE SHIELD

## 2015-10-09 ENCOUNTER — Other Ambulatory Visit: Payer: Self-pay | Admitting: Family Medicine

## 2015-10-12 ENCOUNTER — Other Ambulatory Visit: Payer: Self-pay | Admitting: Family Medicine

## 2015-10-12 DIAGNOSIS — R5381 Other malaise: Secondary | ICD-10-CM

## 2016-02-26 ENCOUNTER — Other Ambulatory Visit (HOSPITAL_COMMUNITY): Payer: Self-pay | Admitting: Family Medicine

## 2016-02-26 ENCOUNTER — Other Ambulatory Visit: Payer: Self-pay | Admitting: Family Medicine

## 2016-02-26 DIAGNOSIS — M899 Disorder of bone, unspecified: Secondary | ICD-10-CM

## 2016-03-13 ENCOUNTER — Ambulatory Visit (HOSPITAL_COMMUNITY)
Admission: RE | Admit: 2016-03-13 | Discharge: 2016-03-13 | Disposition: A | Discharge status: Home / Self Care | Payer: BLUE CROSS/BLUE SHIELD | Source: Ambulatory Visit | Attending: Family Medicine | Admitting: Family Medicine

## 2016-03-13 DIAGNOSIS — Z6841 Body Mass Index (BMI) 40.0 and over, adult: Secondary | ICD-10-CM | POA: Diagnosis not present

## 2016-03-13 DIAGNOSIS — Z1389 Encounter for screening for other disorder: Secondary | ICD-10-CM | POA: Insufficient documentation

## 2016-03-13 DIAGNOSIS — M899 Disorder of bone, unspecified: Secondary | ICD-10-CM

## 2016-04-17 ENCOUNTER — Other Ambulatory Visit (HOSPITAL_COMMUNITY): Payer: Self-pay | Admitting: Family Medicine

## 2016-04-17 DIAGNOSIS — K5730 Diverticulosis of large intestine without perforation or abscess without bleeding: Secondary | ICD-10-CM

## 2016-04-17 DIAGNOSIS — R1032 Left lower quadrant pain: Secondary | ICD-10-CM

## 2016-05-06 ENCOUNTER — Encounter (HOSPITAL_COMMUNITY): Payer: Self-pay

## 2016-05-06 ENCOUNTER — Ambulatory Visit (HOSPITAL_COMMUNITY): Payer: BLUE CROSS/BLUE SHIELD

## 2016-05-27 DIAGNOSIS — F341 Dysthymic disorder: Secondary | ICD-10-CM | POA: Diagnosis not present

## 2016-06-10 DIAGNOSIS — F341 Dysthymic disorder: Secondary | ICD-10-CM | POA: Diagnosis not present

## 2016-06-11 ENCOUNTER — Other Ambulatory Visit (HOSPITAL_COMMUNITY): Payer: Self-pay | Admitting: Family Medicine

## 2016-06-11 DIAGNOSIS — C549 Malignant neoplasm of corpus uteri, unspecified: Secondary | ICD-10-CM

## 2016-06-12 ENCOUNTER — Other Ambulatory Visit (HOSPITAL_COMMUNITY): Payer: Self-pay | Admitting: Family Medicine

## 2016-06-12 DIAGNOSIS — C549 Malignant neoplasm of corpus uteri, unspecified: Secondary | ICD-10-CM

## 2016-06-12 DIAGNOSIS — C548 Malignant neoplasm of overlapping sites of corpus uteri: Secondary | ICD-10-CM

## 2016-06-12 DIAGNOSIS — R109 Unspecified abdominal pain: Secondary | ICD-10-CM

## 2016-06-24 DIAGNOSIS — F341 Dysthymic disorder: Secondary | ICD-10-CM | POA: Diagnosis not present

## 2016-06-26 ENCOUNTER — Ambulatory Visit (HOSPITAL_COMMUNITY)
Admission: RE | Admit: 2016-06-26 | Discharge: 2016-06-26 | Disposition: A | Discharge status: Home / Self Care | Payer: Medicare Other | Source: Ambulatory Visit | Attending: Family Medicine | Admitting: Family Medicine

## 2016-06-26 DIAGNOSIS — R109 Unspecified abdominal pain: Secondary | ICD-10-CM | POA: Insufficient documentation

## 2016-06-26 DIAGNOSIS — Z9049 Acquired absence of other specified parts of digestive tract: Secondary | ICD-10-CM | POA: Insufficient documentation

## 2016-06-26 DIAGNOSIS — Z90710 Acquired absence of both cervix and uterus: Secondary | ICD-10-CM | POA: Diagnosis not present

## 2016-06-26 DIAGNOSIS — C548 Malignant neoplasm of overlapping sites of corpus uteri: Secondary | ICD-10-CM

## 2016-06-26 DIAGNOSIS — R1030 Lower abdominal pain, unspecified: Secondary | ICD-10-CM | POA: Diagnosis not present

## 2016-06-26 DIAGNOSIS — C549 Malignant neoplasm of corpus uteri, unspecified: Secondary | ICD-10-CM

## 2016-06-26 DIAGNOSIS — N2889 Other specified disorders of kidney and ureter: Secondary | ICD-10-CM | POA: Diagnosis not present

## 2016-06-26 LAB — POCT I-STAT CREATININE: Creatinine, Ser: 1 mg/dL (ref 0.44–1.00)

## 2016-06-26 MED ORDER — IOPAMIDOL (ISOVUE-300) INJECTION 61%
100.0000 mL | Freq: Once | INTRAVENOUS | Status: AC | PRN
  Administered 2016-06-26: 100 mL via INTRAVENOUS

## 2016-07-06 DIAGNOSIS — D3002 Benign neoplasm of left kidney: Secondary | ICD-10-CM | POA: Diagnosis not present

## 2016-07-07 ENCOUNTER — Other Ambulatory Visit: Payer: Self-pay | Admitting: Urology

## 2016-07-08 ENCOUNTER — Encounter (INDEPENDENT_AMBULATORY_CARE_PROVIDER_SITE_OTHER): Payer: Self-pay | Admitting: Internal Medicine

## 2016-07-08 DIAGNOSIS — F341 Dysthymic disorder: Secondary | ICD-10-CM | POA: Diagnosis not present

## 2016-07-20 DIAGNOSIS — J4521 Mild intermittent asthma with (acute) exacerbation: Secondary | ICD-10-CM | POA: Diagnosis not present

## 2016-07-20 DIAGNOSIS — I10 Essential (primary) hypertension: Secondary | ICD-10-CM | POA: Diagnosis not present

## 2016-07-20 DIAGNOSIS — Z6839 Body mass index (BMI) 39.0-39.9, adult: Secondary | ICD-10-CM | POA: Diagnosis not present

## 2016-07-20 DIAGNOSIS — Z23 Encounter for immunization: Secondary | ICD-10-CM | POA: Diagnosis not present

## 2016-07-20 DIAGNOSIS — Z1389 Encounter for screening for other disorder: Secondary | ICD-10-CM | POA: Diagnosis not present

## 2016-07-20 DIAGNOSIS — M1991 Primary osteoarthritis, unspecified site: Secondary | ICD-10-CM | POA: Diagnosis not present

## 2016-07-20 DIAGNOSIS — Z01812 Encounter for preprocedural laboratory examination: Secondary | ICD-10-CM | POA: Diagnosis not present

## 2016-07-21 ENCOUNTER — Ambulatory Visit (INDEPENDENT_AMBULATORY_CARE_PROVIDER_SITE_OTHER): Payer: Medicare Other | Admitting: Internal Medicine

## 2016-07-29 ENCOUNTER — Encounter (HOSPITAL_COMMUNITY): Payer: Self-pay | Admitting: *Deleted

## 2016-07-31 ENCOUNTER — Encounter (INDEPENDENT_AMBULATORY_CARE_PROVIDER_SITE_OTHER): Payer: Self-pay | Admitting: Internal Medicine

## 2016-07-31 ENCOUNTER — Telehealth: Payer: Self-pay | Admitting: Internal Medicine

## 2016-07-31 ENCOUNTER — Ambulatory Visit (INDEPENDENT_AMBULATORY_CARE_PROVIDER_SITE_OTHER): Payer: Medicare Other | Admitting: Internal Medicine

## 2016-07-31 VITALS — BP 120/70 | HR 64 | Temp 97.9°F | Ht 65.0 in | Wt 223.9 lb

## 2016-07-31 DIAGNOSIS — A09 Infectious gastroenteritis and colitis, unspecified: Secondary | ICD-10-CM | POA: Diagnosis not present

## 2016-07-31 NOTE — Patient Instructions (Signed)
Rx for Flagyl 500mg  tid x 10 days.

## 2016-07-31 NOTE — Progress Notes (Signed)
Subjective:    Patient ID: Melissa Velazquez, female    DOB: 1950/03/02, 66 y.o.   MRN: [id number]  HPI Referred by Dr. Hilma Favors for small bowel loops. She states she is having some loose stools. Loose stools x 3-4 months.  She was on antibiotics in March or April for possible diverticulitis.  Placed on Cipro and Flagyl  x 10 days. Appetite comes and goes.  She is having a BM 3-4 times a day and all are loose. Not diarrhea.  She has been taking Fiber. Stool has a foul odor.   Hx of uterine cancer and has had a hysterectomy.  Scheduled for partial nephrectomy in August. (renal carcinoma) 06/26/2016 CT abdomen/pelvis with CM:  IMPRESSION: 4.5 cm mass within the left lower kidney - appearance is compatible with either renal cell carcinoma versus oncocytoma. No evidence of lymphadenopathy, renal vein thrombus, adrenal abnormality or distant metastatic disease.  Diffuse wall thickening of multiple proximal mid small bowel loops compatible with enteritis of uncertain etiology. No evidence of bowel obstruction or pneumoperitoneum.    11/24/2012 Colonoscopy with snare polypectomy: Hx of tubulovillous adenoma with high grade dysplasia in 2006.  Prep satisfactory. 10 mm sessile polyp snared from ascending colon. Small polyp was cold snared from descending colon and residual polyp ablated with snare tip. 2 small polyps at descending colon were coagulated. Two small polyps from sigmoid colon were snared(one half snared and one cold snared). Scattered diverticula at sigmoid colon. Small hemorrhoids below the dentate line.  Polyps are hyperplastic. Previously she had tubulovillous adenoma with high-grade dysplasia in 2006. Patient called and message left on her answering service. Next colonoscopy in 5 years   Review of Systems  Past Medical History:  Diagnosis Date  . Allergic rhinitis   . Anal fissure    Hx of   . Arthritis   . Asthma   . Depression   . Diabetes mellitus without  complication ([place])   . Diverticular disease   . Endometrial cancer ([place]) 2001   spread to appendix   . GERD (gastroesophageal reflux disease)   . Hemorrhoids   . Hyperglycemia   . Hypertension   . Obesity   . Sebaceous cyst   . Thyroid disease   . Thyroid nodule   . 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 APPENDECTOMY  2008  . CHOLECYSTECTOMY  2008  . COLONOSCOPY N/A 11/24/2012   Procedure: COLONOSCOPY;  Surgeon: Rogene Houston, MD;  [facility];  Service: Endoscopy;  Laterality: N/A;  830  . TOTAL ABDOMINAL HYSTERECTOMY  2001  . WISDOM TOOTH EXTRACTION      Allergies  Allergen Reactions  . Triple Antibiotic [Bacitracin-Neomycin-Polymyxin] Anaphylaxis  . Levofloxacin     REACTION: Stomach upset    Current Outpatient Prescriptions on File Prior to Visit  Medication Sig Dispense Refill  . levothyroxine (SYNTHROID, LEVOTHROID) 75 MCG tablet Take 75 mcg by mouth daily.      . sertraline (ZOLOFT) 100 MG tablet Take 150 mg by mouth daily. Takes 150 mg once daily    . albuterol (PROVENTIL HFA;VENTOLIN HFA) 108 (90 BASE) MCG/ACT inhaler Inhale into the lungs every 6 (six) hours as needed for wheezing or shortness of breath.    . Azelastine-Fluticasone (DYMISTA) 137-50 MCG/ACT SUSP Place 1 puff into the nose 2 (two) times daily. 1 Bottle 2  . cetirizine (ZYRTEC) 10 MG tablet Take 10 mg by mouth daily as needed.     Marland Kitchen  metFORMIN (GLUCOPHAGE) 500 MG tablet Take 500 mg by mouth daily with breakfast.       No current facility-administered medications on file prior to visit.         Hemp work    Objective:   Physical Exam Blood pressure 120/70, pulse 64, temperature 97.9 F (36.6 C), height 5\' 5"  (1.651 m), weight 223 lb 14.4 oz (101.6 kg).  Alert and oriented. Skin warm and dry. Oral mucosa is moist.   . Sclera anicteric, conjunctivae is pink. Thyroid not enlarged. No cervical lymphadenopathy. Lungs clear.  Heart regular rate and rhythm.  Abdomen is soft. Bowel sounds are positive. No hepatomegaly. No abdominal masses felt. No tenderness.  No edema to lower extremities.          Assessment & Plan:  Enteritis. Am going to get stool studies. (GI pathogen) Am going Flagyl 500mg  tid x 10days. May consider f/u CT

## 2016-07-31 NOTE — Telephone Encounter (Signed)
Pt called.  seen in Dr. Otelia Limes office today. Prescribed Flagyl.  Went to [facility] this evening.  Was told Rx was no there. I reviewed chart.  To expedite pt care, I called in prescription this evening.

## 2016-08-03 ENCOUNTER — Encounter (INDEPENDENT_AMBULATORY_CARE_PROVIDER_SITE_OTHER): Payer: Self-pay | Admitting: Internal Medicine

## 2016-08-03 ENCOUNTER — Telehealth (INDEPENDENT_AMBULATORY_CARE_PROVIDER_SITE_OTHER): Payer: Self-pay | Admitting: Internal Medicine

## 2016-08-03 LAB — GASTROINTESTINAL PATHOGEN PANEL PCR
C. difficile Tox A/B, PCR: NOT DETECTED
CAMPYLOBACTER, PCR: NOT DETECTED
CRYPTOSPORIDIUM, PCR: NOT DETECTED
E COLI (STEC) STX1/STX2, PCR: NOT DETECTED
E coli (ETEC) LT/ST PCR: NOT DETECTED
E coli 0157, PCR: NOT DETECTED
Giardia lamblia, PCR: NOT DETECTED
NOROVIRUS, PCR: NOT DETECTED
ROTAVIRUS, PCR: NOT DETECTED
Salmonella, PCR: NOT DETECTED
Shigella, PCR: NOT DETECTED

## 2016-08-03 MED ORDER — METRONIDAZOLE 500 MG PO TABS
500.0000 mg | ORAL_TABLET | Freq: Two times a day (BID) | ORAL | 0 refills | Status: DC
Start: 2016-08-03 — End: 2016-08-23

## 2016-08-03 NOTE — Telephone Encounter (Signed)
Rx sent to her phamacy 

## 2016-08-05 ENCOUNTER — Encounter (INDEPENDENT_AMBULATORY_CARE_PROVIDER_SITE_OTHER): Payer: Self-pay

## 2016-08-05 DIAGNOSIS — F341 Dysthymic disorder: Secondary | ICD-10-CM | POA: Diagnosis not present

## 2016-08-13 NOTE — Patient Instructions (Signed)
Melissa Velazquez  08/13/2016   Your procedure is scheduled on: 08/21/16  Report to [facility] Main  Entrance Take [place]  elevators to 3rd floor to  [place] at     1015 AM.    Call this number if you have problems the morning of surgery [phone]    Remember: ONLY 1 PERSON MAY GO WITH YOU TO SHORT STAY TO GET  READY MORNING OF YOUR SURGERY.  Do not eat food or drink liquids :After Midnight.     Take these medicines the morning of surgery with A SIP OF WATER: synthroid, zoloft                                You may not have any metal on your body including hair pins and              piercings  Do not wear jewelry lotions, powders or perfumes, deodorant       .     Do not shave 48 hours prior to procedure   Do not bring valuables to the hospital. [place].  Contacts, dentures or bridgework may not be worn into surgery.  Leave suitcase in the car. After surgery it may be brought to your room.     Patients discharged the day of surgery will not be allowed to drive home.  Name and phone number of your driver:  Special Instructions: N/A              Please read over the following fact sheets you were given: _____________________________________________________________________             [facility] - Preparing for Surgery Before surgery, you can play an important role.  Because skin is not sterile, your skin needs to be as free of germs as possible.  You can reduce the number of germs on your skin by washing with CHG (chlorahexidine gluconate) soap before surgery.  CHG is an antiseptic cleaner which kills germs and bonds with the skin to continue killing germs even after washing. Please DO NOT use if you have an allergy to CHG or antibacterial soaps.  If your skin becomes reddened/irritated stop using the CHG and inform your nurse when you arrive at Short Stay. Do not shave (including legs and  underarms) for at least 48 hours prior to the first CHG shower.  You may shave your face/neck. Please follow these instructions carefully:  1.  Shower with CHG Soap the night before surgery and the  morning of Surgery.  2.  If you choose to wash your hair, wash your hair first as usual with your  normal  shampoo.  3.  After you shampoo, rinse your hair and body thoroughly to remove the  shampoo.                           4.  Use CHG as you would any other liquid soap.  You can apply chg directly  to the skin and wash                       Gently with a scrungie or clean washcloth.  5.  Apply the CHG Soap to your body ONLY FROM THE NECK DOWN.   Do not use on face/ open                           Wound or open sores. Avoid contact with eyes, ears mouth and genitals (private parts).                       Wash face,  Genitals (private parts) with your normal soap.             6.  Wash thoroughly, paying special attention to the area where your surgery  will be performed.  7.  Thoroughly rinse your body with warm water from the neck down.  8.  DO NOT shower/wash with your normal soap after using and rinsing off  the CHG Soap.                9.  Pat yourself dry with a clean towel.            10.  Wear clean pajamas.            11.  Place clean sheets on your bed the night of your first shower and do not  sleep with pets. Day of Surgery : Do not apply any lotions/deodorants the morning of surgery.  Please wear clean clothes to the hospital/surgery center.  FAILURE TO FOLLOW THESE INSTRUCTIONS MAY RESULT IN THE CANCELLATION OF YOUR SURGERY PATIENT SIGNATURE_________________________________  NURSE SIGNATURE__________________________________  ________________________________________________________________________  WHAT IS A BLOOD TRANSFUSION? Blood Transfusion Information  A transfusion is the replacement of blood or some of its parts. Blood is made up of multiple cells which provide different  functions.  Red blood cells carry oxygen and are used for blood loss replacement.  White blood cells fight against infection.  Platelets control bleeding.  Plasma helps clot blood.  Other blood products are available for specialized needs, such as hemophilia or other clotting disorders. BEFORE THE TRANSFUSION  Who gives blood for transfusions?   Healthy volunteers who are fully evaluated to make sure their blood is safe. This is blood bank blood. Transfusion therapy is the safest it has ever been in the practice of medicine. Before blood is taken from a donor, a complete history is taken to make sure that person has no history of diseases nor engages in risky social behavior (examples are intravenous drug use or sexual activity with multiple partners). The donor's travel history is screened to minimize risk of transmitting infections, such as malaria. The donated blood is tested for signs of infectious diseases, such as HIV and hepatitis. The blood is then tested to be sure it is compatible with you in order to minimize the chance of a transfusion reaction. If you or a relative donates blood, this is often done in anticipation of surgery and is not appropriate for emergency situations. It takes many days to process the donated blood. RISKS AND COMPLICATIONS Although transfusion therapy is very safe and saves many lives, the main dangers of transfusion include:   Getting an infectious disease.  Developing a transfusion reaction. This is an allergic reaction to something in the blood you were given. Every precaution is taken to prevent this. The decision to have a blood transfusion has been considered carefully by your caregiver before blood is given. Blood is not given unless the benefits outweigh the risks. AFTER THE TRANSFUSION  Right after receiving a  blood transfusion, you will usually feel much better and more energetic. This is especially true if your red blood cells have gotten low  (anemic). The transfusion raises the level of the red blood cells which carry oxygen, and this usually causes an energy increase.  The nurse administering the transfusion will monitor you carefully for complications. HOME CARE INSTRUCTIONS  No special instructions are needed after a transfusion. You may find your energy is better. Speak with your caregiver about any limitations on activity for underlying diseases you may have. SEEK MEDICAL CARE IF:   Your condition is not improving after your transfusion.  You develop redness or irritation at the intravenous (IV) site. SEEK IMMEDIATE MEDICAL CARE IF:  Any of the following symptoms occur over the next 12 hours:  Shaking chills.  You have a temperature by mouth above 102 F (38.9 C), not controlled by medicine.  Chest, back, or muscle pain.  People around you feel you are not acting correctly or are confused.  Shortness of breath or difficulty breathing.  Dizziness and fainting.  You get a rash or develop hives.  You have a decrease in urine output.  Your urine turns a dark color or changes to pink, red, or brown. Any of the following symptoms occur over the next 10 days:  You have a temperature by mouth above 102 F (38.9 C), not controlled by medicine.  Shortness of breath.  Weakness after normal activity.  The white part of the eye turns yellow (jaundice).  You have a decrease in the amount of urine or are urinating less often.  Your urine turns a dark color or changes to pink, red, or brown. Document Released: 01/03/2000 Document Revised: 03/30/2011 Document Reviewed: 08/22/2007 [facility] Patient Information 2014 [place], [place].  _______________________________________________________________________

## 2016-08-14 ENCOUNTER — Encounter (HOSPITAL_COMMUNITY): Payer: Self-pay

## 2016-08-14 ENCOUNTER — Encounter (HOSPITAL_COMMUNITY)
Admission: RE | Admit: 2016-08-14 | Discharge: 2016-08-14 | Disposition: A | Discharge status: Home / Self Care | Payer: Medicare Other | Source: Ambulatory Visit | Attending: Urology | Admitting: Urology

## 2016-08-14 DIAGNOSIS — Z01812 Encounter for preprocedural laboratory examination: Secondary | ICD-10-CM | POA: Insufficient documentation

## 2016-08-14 DIAGNOSIS — N2889 Other specified disorders of kidney and ureter: Secondary | ICD-10-CM | POA: Insufficient documentation

## 2016-08-14 HISTORY — DX: Cardiac arrhythmia, unspecified: I49.9

## 2016-08-14 HISTORY — DX: Hypothyroidism, unspecified: E03.9

## 2016-08-14 HISTORY — DX: Anxiety disorder, unspecified: F41.9

## 2016-08-14 LAB — COMPREHENSIVE METABOLIC PANEL
ALK PHOS: 63 U/L (ref 38–126)
ALT: 19 U/L (ref 14–54)
AST: 23 U/L (ref 15–41)
Albumin: 3.9 g/dL (ref 3.5–5.0)
Anion gap: 8 (ref 5–15)
BUN: 15 mg/dL (ref 6–20)
CHLORIDE: 106 mmol/L (ref 101–111)
CO2: 28 mmol/L (ref 22–32)
Calcium: 9 mg/dL (ref 8.9–10.3)
Creatinine, Ser: 0.91 mg/dL (ref 0.44–1.00)
Glucose, Bld: 85 mg/dL (ref 65–99)
POTASSIUM: 4.2 mmol/L (ref 3.5–5.1)
SODIUM: 142 mmol/L (ref 135–145)
Total Bilirubin: 0.6 mg/dL (ref 0.3–1.2)
Total Protein: 7 g/dL (ref 6.5–8.1)

## 2016-08-14 LAB — CBC
HCT: 40.9 % (ref 36.0–46.0)
Hemoglobin: 13.5 g/dL (ref 12.0–15.0)
MCH: 30.8 pg (ref 26.0–34.0)
MCHC: 33 g/dL (ref 30.0–36.0)
MCV: 93.2 fL (ref 78.0–100.0)
PLATELETS: 187 10*3/uL (ref 150–400)
RBC: 4.39 MIL/uL (ref 3.87–5.11)
RDW: 13.9 % (ref 11.5–15.5)
WBC: 4.4 10*3/uL (ref 4.0–10.5)

## 2016-08-14 LAB — ABO/RH: ABO/RH(D): A POS

## 2016-08-14 LAB — GLUCOSE, CAPILLARY: GLUCOSE-CAPILLARY: 107 mg/dL — AB (ref 65–99)

## 2016-08-14 NOTE — Progress Notes (Signed)
Pt. Reported  At preop that she has a soap allergy even dial antibacterial.  Documented this in special needs . Pt.. Stated she would try the chg soap on a small area then if she reacted she would use her normal soap.

## 2016-08-15 LAB — HEMOGLOBIN A1C
HEMOGLOBIN A1C: 5.7 % — AB (ref 4.8–5.6)
Mean Plasma Glucose: 117 mg/dL

## 2016-08-19 DIAGNOSIS — F341 Dysthymic disorder: Secondary | ICD-10-CM | POA: Diagnosis not present

## 2016-08-20 NOTE — Progress Notes (Signed)
Verified with pt. That time had changed back to the 12:00 time. Pt. Aware.

## 2016-08-21 ENCOUNTER — Inpatient Hospital Stay (HOSPITAL_COMMUNITY): Payer: Medicare Other | Admitting: Anesthesiology

## 2016-08-21 ENCOUNTER — Encounter (HOSPITAL_COMMUNITY): Payer: Self-pay | Admitting: *Deleted

## 2016-08-21 ENCOUNTER — Inpatient Hospital Stay (HOSPITAL_COMMUNITY)
Admission: RE | Admit: 2016-08-21 | Discharge: 2016-08-23 | DRG: 658 | Disposition: A | Discharge status: Home / Self Care | Payer: Medicare Other | Source: Ambulatory Visit | Attending: Urology | Admitting: Urology

## 2016-08-21 ENCOUNTER — Encounter (HOSPITAL_COMMUNITY)
Admission: RE | Disposition: A | Discharge status: Home / Self Care | Payer: Self-pay | Source: Ambulatory Visit | Attending: Urology

## 2016-08-21 DIAGNOSIS — Z6837 Body mass index (BMI) 37.0-37.9, adult: Secondary | ICD-10-CM

## 2016-08-21 DIAGNOSIS — F329 Major depressive disorder, single episode, unspecified: Secondary | ICD-10-CM | POA: Diagnosis present

## 2016-08-21 DIAGNOSIS — C642 Malignant neoplasm of left kidney, except renal pelvis: Principal | ICD-10-CM | POA: Diagnosis present

## 2016-08-21 DIAGNOSIS — Z8542 Personal history of malignant neoplasm of other parts of uterus: Secondary | ICD-10-CM | POA: Diagnosis not present

## 2016-08-21 DIAGNOSIS — Z90710 Acquired absence of both cervix and uterus: Secondary | ICD-10-CM

## 2016-08-21 DIAGNOSIS — N2889 Other specified disorders of kidney and ureter: Secondary | ICD-10-CM | POA: Diagnosis not present

## 2016-08-21 DIAGNOSIS — D49512 Neoplasm of unspecified behavior of left kidney: Secondary | ICD-10-CM | POA: Diagnosis not present

## 2016-08-21 DIAGNOSIS — K660 Peritoneal adhesions (postprocedural) (postinfection): Secondary | ICD-10-CM | POA: Diagnosis present

## 2016-08-21 DIAGNOSIS — Z87891 Personal history of nicotine dependence: Secondary | ICD-10-CM

## 2016-08-21 DIAGNOSIS — C55 Malignant neoplasm of uterus, part unspecified: Secondary | ICD-10-CM | POA: Diagnosis not present

## 2016-08-21 DIAGNOSIS — Z803 Family history of malignant neoplasm of breast: Secondary | ICD-10-CM

## 2016-08-21 DIAGNOSIS — E039 Hypothyroidism, unspecified: Secondary | ICD-10-CM | POA: Diagnosis present

## 2016-08-21 HISTORY — PX: ROBOTIC ASSITED PARTIAL NEPHRECTOMY: SHX6087

## 2016-08-21 LAB — TYPE AND SCREEN
ABO/RH(D): A POS
ANTIBODY SCREEN: NEGATIVE

## 2016-08-21 LAB — HEMOGLOBIN AND HEMATOCRIT, BLOOD
HEMATOCRIT: 40.4 % (ref 36.0–46.0)
Hemoglobin: 13.9 g/dL (ref 12.0–15.0)

## 2016-08-21 SURGERY — NEPHRECTOMY, PARTIAL, ROBOT-ASSISTED
Anesthesia: General | Site: Abdomen | Laterality: Left

## 2016-08-21 MED ORDER — FENTANYL CITRATE (PF) 100 MCG/2ML IJ SOLN
INTRAMUSCULAR | Status: DC | PRN
  Administered 2016-08-21: 100 ug via INTRAVENOUS
  Administered 2016-08-21 (×3): 50 ug via INTRAVENOUS

## 2016-08-21 MED ORDER — LIDOCAINE 2% (20 MG/ML) 5 ML SYRINGE
INTRAMUSCULAR | Status: DC | PRN
  Administered 2016-08-21: 80 mg via INTRAVENOUS
  Administered 2016-08-21: 20 mg via INTRAVENOUS

## 2016-08-21 MED ORDER — BUPIVACAINE-EPINEPHRINE 0.5% -1:200000 IJ SOLN
INTRAMUSCULAR | Status: DC | PRN
  Administered 2016-08-21: 5 mL

## 2016-08-21 MED ORDER — BUPIVACAINE-EPINEPHRINE 0.5% -1:200000 IJ SOLN
INTRAMUSCULAR | Status: AC
  Filled 2016-08-21: qty 1

## 2016-08-21 MED ORDER — TRAMADOL HCL 50 MG PO TABS: 50.0000 mg | ORAL_TABLET | Freq: Four times a day (QID) | ORAL | 0 refills | Status: DC | PRN

## 2016-08-21 MED ORDER — MORPHINE SULFATE (PF) 2 MG/ML IV SOLN
2.0000 mg | INTRAVENOUS | Status: DC | PRN
  Administered 2016-08-21 – 2016-08-22 (×2): 2 mg via INTRAVENOUS
  Filled 2016-08-21 (×2): qty 1

## 2016-08-21 MED ORDER — PROMETHAZINE HCL 25 MG/ML IJ SOLN: 6.2500 mg | INTRAMUSCULAR | Status: DC | PRN

## 2016-08-21 MED ORDER — CEFAZOLIN SODIUM-DEXTROSE 2-4 GM/100ML-% IV SOLN
2.0000 g | INTRAVENOUS | Status: AC
  Administered 2016-08-21 (×2): 2 g via INTRAVENOUS
  Filled 2016-08-21: qty 100

## 2016-08-21 MED ORDER — SERTRALINE HCL 50 MG PO TABS
150.0000 mg | ORAL_TABLET | Freq: Every day | ORAL | Status: DC
  Administered 2016-08-22 – 2016-08-23 (×2): 150 mg via ORAL
  Filled 2016-08-21 (×2): qty 1

## 2016-08-21 MED ORDER — PROPOFOL 10 MG/ML IV BOLUS
INTRAVENOUS | Status: AC
  Filled 2016-08-21: qty 20

## 2016-08-21 MED ORDER — LEVOTHYROXINE SODIUM 50 MCG PO TABS
75.0000 ug | ORAL_TABLET | Freq: Every day | ORAL | Status: DC
  Administered 2016-08-22 – 2016-08-23 (×2): 75 ug via ORAL
  Filled 2016-08-21 (×2): qty 1

## 2016-08-21 MED ORDER — PROPOFOL 10 MG/ML IV BOLUS
INTRAVENOUS | Status: DC | PRN
  Administered 2016-08-21: 160 mg via INTRAVENOUS

## 2016-08-21 MED ORDER — SODIUM CHLORIDE 0.9 % IJ SOLN
INTRAMUSCULAR | Status: AC
  Filled 2016-08-21: qty 50

## 2016-08-21 MED ORDER — SUGAMMADEX SODIUM 200 MG/2ML IV SOLN
INTRAVENOUS | Status: AC
  Filled 2016-08-21: qty 2

## 2016-08-21 MED ORDER — ONDANSETRON HCL 4 MG/2ML IJ SOLN
INTRAMUSCULAR | Status: DC | PRN
  Administered 2016-08-21: 4 mg via INTRAVENOUS

## 2016-08-21 MED ORDER — HYDROMORPHONE HCL-NACL 0.5-0.9 MG/ML-% IV SOSY
PREFILLED_SYRINGE | INTRAVENOUS | Status: AC
  Filled 2016-08-21: qty 2

## 2016-08-21 MED ORDER — ONDANSETRON HCL 4 MG/2ML IJ SOLN: 4.0000 mg | INTRAMUSCULAR | Status: DC | PRN

## 2016-08-21 MED ORDER — HYDROMORPHONE HCL 2 MG/ML IJ SOLN
INTRAMUSCULAR | Status: AC
  Filled 2016-08-21: qty 1

## 2016-08-21 MED ORDER — DEXTROSE-NACL 5-0.45 % IV SOLN
INTRAVENOUS | Status: DC
  Administered 2016-08-21 – 2016-08-22 (×2): via INTRAVENOUS

## 2016-08-21 MED ORDER — ACETAMINOPHEN 10 MG/ML IV SOLN
INTRAVENOUS | Status: AC
  Filled 2016-08-21: qty 100

## 2016-08-21 MED ORDER — MIDAZOLAM HCL 2 MG/2ML IJ SOLN
INTRAMUSCULAR | Status: AC
  Filled 2016-08-21: qty 2

## 2016-08-21 MED ORDER — TRAMADOL HCL 50 MG PO TABS
50.0000 mg | ORAL_TABLET | Freq: Four times a day (QID) | ORAL | Status: DC | PRN
  Administered 2016-08-22 – 2016-08-23 (×4): 100 mg via ORAL
  Filled 2016-08-21 (×4): qty 2

## 2016-08-21 MED ORDER — BUPIVACAINE LIPOSOME 1.3 % IJ SUSP
20.0000 mL | Freq: Once | INTRAMUSCULAR | Status: AC
  Administered 2016-08-21: 20 mL
  Filled 2016-08-21: qty 20

## 2016-08-21 MED ORDER — DIPHENHYDRAMINE HCL 12.5 MG/5ML PO ELIX
12.5000 mg | ORAL_SOLUTION | Freq: Four times a day (QID) | ORAL | Status: DC | PRN
Start: 2016-08-21 — End: 2016-08-23

## 2016-08-21 MED ORDER — ACETAMINOPHEN 10 MG/ML IV SOLN
1000.0000 mg | Freq: Four times a day (QID) | INTRAVENOUS | Status: AC
  Administered 2016-08-21 – 2016-08-22 (×4): 1000 mg via INTRAVENOUS
  Filled 2016-08-21 (×3): qty 100

## 2016-08-21 MED ORDER — LACTATED RINGERS IV SOLN
INTRAVENOUS | Status: DC
  Administered 2016-08-21 (×4): via INTRAVENOUS

## 2016-08-21 MED ORDER — DEXAMETHASONE SODIUM PHOSPHATE 10 MG/ML IJ SOLN
INTRAMUSCULAR | Status: AC
  Filled 2016-08-21: qty 1

## 2016-08-21 MED ORDER — LIDOCAINE 2% (20 MG/ML) 5 ML SYRINGE
INTRAMUSCULAR | Status: AC
  Filled 2016-08-21: qty 5

## 2016-08-21 MED ORDER — SODIUM CHLORIDE 0.9 % IJ SOLN
INTRAMUSCULAR | Status: DC | PRN
  Administered 2016-08-21: 50 mL

## 2016-08-21 MED ORDER — ONDANSETRON HCL 4 MG/2ML IJ SOLN
INTRAMUSCULAR | Status: AC
  Filled 2016-08-21: qty 2

## 2016-08-21 MED ORDER — DIPHENHYDRAMINE HCL 50 MG/ML IJ SOLN: 12.5000 mg | Freq: Four times a day (QID) | INTRAMUSCULAR | Status: DC | PRN

## 2016-08-21 MED ORDER — FENTANYL CITRATE (PF) 250 MCG/5ML IJ SOLN
INTRAMUSCULAR | Status: AC
  Filled 2016-08-21: qty 5

## 2016-08-21 MED ORDER — ALBUTEROL SULFATE (2.5 MG/3ML) 0.083% IN NEBU: 2.5000 mg | INHALATION_SOLUTION | Freq: Four times a day (QID) | RESPIRATORY_TRACT | Status: DC | PRN

## 2016-08-21 MED ORDER — ROCURONIUM BROMIDE 50 MG/5ML IV SOSY
PREFILLED_SYRINGE | INTRAVENOUS | Status: AC
  Filled 2016-08-21: qty 5

## 2016-08-21 MED ORDER — HEMOSTATIC AGENTS (NO CHARGE) OPTIME
TOPICAL | Status: DC | PRN
  Administered 2016-08-21: 1 via TOPICAL

## 2016-08-21 MED ORDER — ROCURONIUM BROMIDE 10 MG/ML (PF) SYRINGE
PREFILLED_SYRINGE | INTRAVENOUS | Status: DC | PRN
  Administered 2016-08-21: 20 mg via INTRAVENOUS
  Administered 2016-08-21: 5 mg via INTRAVENOUS
  Administered 2016-08-21: 10 mg via INTRAVENOUS
  Administered 2016-08-21: 50 mg via INTRAVENOUS

## 2016-08-21 MED ORDER — MIDAZOLAM HCL 5 MG/5ML IJ SOLN
INTRAMUSCULAR | Status: DC | PRN
  Administered 2016-08-21: 2 mg via INTRAVENOUS

## 2016-08-21 MED ORDER — ALBUTEROL SULFATE HFA 108 (90 BASE) MCG/ACT IN AERS: 2.0000 | INHALATION_SPRAY | Freq: Four times a day (QID) | RESPIRATORY_TRACT | Status: DC | PRN

## 2016-08-21 MED ORDER — DEXAMETHASONE SODIUM PHOSPHATE 10 MG/ML IJ SOLN
INTRAMUSCULAR | Status: DC | PRN
  Administered 2016-08-21: 10 mg via INTRAVENOUS

## 2016-08-21 MED ORDER — HYDROMORPHONE HCL-NACL 0.5-0.9 MG/ML-% IV SOSY
0.2500 mg | PREFILLED_SYRINGE | INTRAVENOUS | Status: DC | PRN
  Administered 2016-08-21: 0.5 mg via INTRAVENOUS

## 2016-08-21 MED ORDER — CLONAZEPAM 0.5 MG PO TABS: 0.5000 mg | ORAL_TABLET | Freq: Two times a day (BID) | ORAL | Status: DC | PRN

## 2016-08-21 MED ORDER — HYDROMORPHONE HCL 1 MG/ML IJ SOLN
INTRAMUSCULAR | Status: DC | PRN
  Administered 2016-08-21 (×2): .2 mg via INTRAVENOUS
  Administered 2016-08-21: .4 mg via INTRAVENOUS

## 2016-08-21 MED ORDER — ACETAMINOPHEN 325 MG PO TABS
650.0000 mg | ORAL_TABLET | ORAL | Status: DC | PRN
  Administered 2016-08-23: 650 mg via ORAL
  Filled 2016-08-21: qty 2

## 2016-08-21 MED ORDER — PHENYLEPHRINE HCL 10 MG/ML IJ SOLN
INTRAMUSCULAR | Status: DC | PRN
  Administered 2016-08-21: 40 ug via INTRAVENOUS

## 2016-08-21 SURGICAL SUPPLY — 62 items
APPLICATOR ARISTA FLEXITIP XL (MISCELLANEOUS) IMPLANT
APPLICATOR SURGIFLO ENDO (HEMOSTASIS) ×2 IMPLANT
CHLORAPREP W/TINT 26ML (MISCELLANEOUS) ×2 IMPLANT
CLIP LIGATING HEM O LOK PURPLE (MISCELLANEOUS) ×4 IMPLANT
CLIP LIGATING HEMO LOK XL GOLD (MISCELLANEOUS) IMPLANT
CLIP LIGATING HEMO O LOK GREEN (MISCELLANEOUS) ×4 IMPLANT
COVER SURGICAL LIGHT HANDLE (MISCELLANEOUS) ×2 IMPLANT
COVER TIP SHEARS 8 DVNC (MISCELLANEOUS) ×2 IMPLANT
COVER TIP SHEARS 8MM DA VINCI (MISCELLANEOUS) ×2
DECANTER SPIKE VIAL GLASS SM (MISCELLANEOUS) ×2 IMPLANT
DERMABOND ADVANCED (GAUZE/BANDAGES/DRESSINGS) ×2
DERMABOND ADVANCED .7 DNX12 (GAUZE/BANDAGES/DRESSINGS) ×2 IMPLANT
DRAIN CHANNEL 15F RND FF 3/16 (WOUND CARE) ×2 IMPLANT
DRAPE ARM DVNC X/XI (DISPOSABLE) ×4 IMPLANT
DRAPE COLUMN DVNC XI (DISPOSABLE) ×1 IMPLANT
DRAPE DA VINCI XI ARM (DISPOSABLE) ×4
DRAPE DA VINCI XI COLUMN (DISPOSABLE) ×1
DRAPE INCISE IOBAN 66X45 STRL (DRAPES) ×2 IMPLANT
DRAPE SHEET LG 3/4 BI-LAMINATE (DRAPES) ×2 IMPLANT
ELECT PENCIL ROCKER SW 15FT (MISCELLANEOUS) ×2 IMPLANT
ELECT REM PT RETURN 15FT ADLT (MISCELLANEOUS) ×2 IMPLANT
EVACUATOR SILICONE 100CC (DRAIN) ×2 IMPLANT
GLOVE BIO SURGEON STRL SZ 6.5 (GLOVE) ×2 IMPLANT
GLOVE BIOGEL M STRL SZ7.5 (GLOVE) ×4 IMPLANT
GOWN STRL REUS W/TWL LRG LVL3 (GOWN DISPOSABLE) ×6 IMPLANT
HEMOSTAT ARISTA ABSORB 3G PWDR (MISCELLANEOUS) IMPLANT
IRRIG SUCT STRYKERFLOW 2 WTIP (MISCELLANEOUS) ×2
IRRIGATION SUCT STRKRFLW 2 WTP (MISCELLANEOUS) ×1 IMPLANT
KIT BASIN OR (CUSTOM PROCEDURE TRAY) ×2 IMPLANT
LOOP VESSEL MAXI BLUE (MISCELLANEOUS) IMPLANT
MARKER SKIN DUAL TIP RULER LAB (MISCELLANEOUS) ×2 IMPLANT
PORT ACCESS TROCAR AIRSEAL 12 (TROCAR) ×1 IMPLANT
PORT ACCESS TROCAR AIRSEAL 5M (TROCAR) ×1
POSITIONER SURGICAL ARM (MISCELLANEOUS) ×4 IMPLANT
POUCH SPECIMEN RETRIEVAL 10MM (ENDOMECHANICALS) ×2 IMPLANT
SEAL CANN UNIV 5-8 DVNC XI (MISCELLANEOUS) ×4 IMPLANT
SEAL XI 5MM-8MM UNIVERSAL (MISCELLANEOUS) ×4
SET TRI-LUMEN FLTR TB AIRSEAL (TUBING) ×2 IMPLANT
SOLUTION ELECTROLUBE (MISCELLANEOUS) ×2 IMPLANT
SPOGE SURGIFLO 8M (HEMOSTASIS)
SPONGE SURGIFLO 8M (HEMOSTASIS) IMPLANT
SURGIFLO W/THROMBIN 8M KIT (HEMOSTASIS) ×2 IMPLANT
SUT ETHILON 3 0 PS 1 (SUTURE) ×2 IMPLANT
SUT MNCRL AB 4-0 PS2 18 (SUTURE) ×4 IMPLANT
SUT PDS AB 1 TP1 54 (SUTURE) ×2 IMPLANT
SUT V-LOC BARB 180 2/0GR6 GS22 (SUTURE) ×2
SUT VIC AB 0 CT1 27 (SUTURE) ×1
SUT VIC AB 0 CT1 27XBRD ANTBC (SUTURE) ×1 IMPLANT
SUT VIC AB 3-0 SH 27 (SUTURE) ×2
SUT VIC AB 3-0 SH 27X BRD (SUTURE) ×2 IMPLANT
SUT VICRYL 0 UR6 27IN ABS (SUTURE) IMPLANT
SUT VLOC BARB 180 ABS3/0GR12 (SUTURE) ×2
SUTURE V-LC BRB 180 2/0GR6GS22 (SUTURE) ×1 IMPLANT
SUTURE VLOC BRB 180 ABS3/0GR12 (SUTURE) ×1 IMPLANT
TOWEL OR 17X26 10 PK STRL BLUE (TOWEL DISPOSABLE) ×2 IMPLANT
TOWEL OR NON WOVEN STRL DISP B (DISPOSABLE) ×2 IMPLANT
TRAY FOLEY CATH SILVER 14FR (SET/KITS/TRAYS/PACK) ×2 IMPLANT
TRAY FOLEY W/METER SILVER 16FR (SET/KITS/TRAYS/PACK) IMPLANT
TRAY LAPAROSCOPIC (CUSTOM PROCEDURE TRAY) ×2 IMPLANT
TROCAR BLADELESS OPT 5 100 (ENDOMECHANICALS) IMPLANT
TROCAR XCEL 12X100 BLDLESS (ENDOMECHANICALS) IMPLANT
WATER STERILE IRR 1000ML POUR (IV SOLUTION) ×2 IMPLANT

## 2016-08-21 NOTE — Progress Notes (Signed)
Received patient from PACU, A&O, VS obtained, IVFs infusing without difficulty, oriented to unit, call light placed in reach

## 2016-08-21 NOTE — Discharge Instructions (Signed)

## 2016-08-21 NOTE — Anesthesia Procedure Notes (Signed)
Procedure Name: Intubation Date/Time: 08/21/2016 1:47 PM Performed by: Anne Fu Pre-anesthesia Checklist: Patient identified, Emergency Drugs available, Suction available and Patient being monitored Patient Re-evaluated:Patient Re-evaluated prior to induction Oxygen Delivery Method: Circle system utilized Preoxygenation: Pre-oxygenation with 100% oxygen Induction Type: IV induction Ventilation: Mask ventilation without difficulty Laryngoscope Size: Mac and 4 Grade View: Grade I Tube type: Oral Number of attempts: 1 Airway Equipment and Method: Stylet and Oral airway Placement Confirmation: ETT inserted through vocal cords under direct vision,  positive ETCO2 and breath sounds checked- equal and bilateral Secured at: 22 cm Tube secured with: Tape Dental Injury: Teeth and Oropharynx as per pre-operative assessment

## 2016-08-21 NOTE — Anesthesia Preprocedure Evaluation (Addendum)
Anesthesia Evaluation  Patient identified by MRN, date of birth, ID band Patient awake    Reviewed: Allergy & Precautions, NPO status , Patient's Chart, lab work & pertinent test results  Airway Mallampati: II  TM Distance: >3 FB Neck ROM: Full    Dental no notable dental hx.    Pulmonary neg pulmonary ROS, former smoker,    Pulmonary exam normal breath sounds clear to auscultation       Cardiovascular hypertension, Normal cardiovascular exam Rhythm:Regular Rate:Normal     Neuro/Psych negative neurological ROS  negative psych ROS   GI/Hepatic negative GI ROS, Neg liver ROS,   Endo/Other  diabetesHypothyroidism Morbid obesity  Renal/GU negative Renal ROS  negative genitourinary   Musculoskeletal negative musculoskeletal ROS (+)   Abdominal   Peds negative pediatric ROS (+)  Hematology negative hematology ROS (+)   Anesthesia Other Findings   Reproductive/Obstetrics negative OB ROS                            Anesthesia Physical Anesthesia Plan  ASA: III  Anesthesia Plan: General   Post-op Pain Management:    Induction: Intravenous  PONV Risk Score and Plan: 2 and Ondansetron, Dexamethasone and Treatment may vary due to age or medical condition  Airway Management Planned: Oral ETT  Additional Equipment:   Intra-op Plan:   Post-operative Plan: Extubation in OR  Informed Consent: I have reviewed the patients History and Physical, chart, labs and discussed the procedure including the risks, benefits and alternatives for the proposed anesthesia with the patient or authorized representative who has indicated his/her understanding and acceptance.   Dental advisory given  Plan Discussed with: CRNA and Surgeon  Anesthesia Plan Comments:        Anesthesia Quick Evaluation

## 2016-08-21 NOTE — H&P (Signed)
Renal Mass  HPI: Melissa Velazquez is a 66 year-old female patient who is here further eval and management of a renal mass.  The mass is on the left side.   The lesion(s) was first noted on 06/26/2016. The mass was seen on CT Scan.   Her symptoms include nausea. Patient denies having flank pain, back pain, groin pain, vomiting, fever, chills, and blood in urine. She has not seen blood in her urine. She does have a good appetite. She is having pain in new locations. The pain is located in the left lower quadrant. Patient denies epigastric region, right lower quadrant, right upper quadrant, left upper quadrant, and suprapubic area. She has not recently had unwanted weight loss.   She has had previous abdominal surgery. Her past surgical history includes: lap appendectomy/lymph node resection. The patient can walk a flight of steps.   The patient denies history of diabetes, heart attack or stroke. There is not a a family history of kidney cancer. There is no family history of brain tumors (AMLs), seizures or brain aneurysm's.   CT scan obtained for chronic abdominal and pelvic pain. CT in 2011 showed small <2.0cm mass that was not mentioned in impression.   6 the patient has a history of endometrial cancer status post radical hysterectomy. She then had recurrence on the tibia for appendix as well as in the lymph node and subsequently underwent laparoscopic resection of these.   The patient was diagnosed as a non-insulin-dependent diabetic 10 years ago, however she is no longer taking her medications and her A1c was normal.       ALLERGIES: Bactrim Contrast Dye Levofloxacin Triple Antibiotic OINT    MEDICATIONS: Levothyroxine Sodium  Cbd Oil  Sertraline Hcl     GU PSH: Hysterectomy    NON-GU PSH: Appendectomy Cholecystectomy (laparoscopic)    GU PMH: None   NON-GU PMH: Anxiety Arthritis Asthma Depression Hypothyroidism    FAMILY HISTORY: Breast Cancer - Runs in Family MVA  (motor vehicle accident) - Mother Prostate Cancer - Father sepsis - Father    Notes: 0 children; mother was in MVA and in a "vegetable" like state; Breast Cancer was found in mother after MVA   SOCIAL HISTORY: Marital Status: Unknown Current Smoking Status: Patient does not smoke anymore.   Tobacco Use Assessment Completed: Used Tobacco in last 30 days? Does drink.     REVIEW OF SYSTEMS:    GU Review Female:   Patient denies frequent urination, hard to postpone urination, burning /pain with urination, get up at night to urinate, leakage of urine, stream starts and stops, trouble starting your stream, have to strain to urinate, and being pregnant.  Gastrointestinal (Upper):   Patient denies vomiting, nausea, and indigestion/ heartburn.  Gastrointestinal (Lower):   Patient reports diarrhea. Patient denies constipation.  Constitutional:   Patient reports weight loss and fatigue. Patient denies fever and night sweats.  Skin:   Patient reports skin rash/ lesion and itching.   Eyes:   Patient reports blurred vision. Patient denies double vision.  Ears/ Nose/ Throat:   Patient reports sinus problems. Patient denies sore throat.  Hematologic/Lymphatic:   Patient denies swollen glands and easy bruising.  Cardiovascular:   Patient denies leg swelling and chest pains.  Respiratory:   Patient denies cough and shortness of breath.  Endocrine:   Patient denies excessive thirst.  Musculoskeletal:   Patient reports back pain and joint pain.   Neurological:   Patient denies headaches and dizziness.  Psychologic:  Patient denies depression and anxiety.   VITAL SIGNS:      07/06/2016 12:33 PM  Weight 220 lb / 99.79 kg  Height 64 in / 162.56 cm  BP 118/77 mmHg  Pulse 73 /min  BMI 37.8 kg/m   MULTI-SYSTEM PHYSICAL EXAMINATION:    Constitutional: Well-nourished. No physical deformities. Normally developed. Good grooming.  Neck: Neck symmetrical, not swollen. Normal tracheal position.  Respiratory:  No labored breathing, no use of accessory muscles. Clear to auscultation bilaterally  Cardiovascular: Normal temperature, normal extremity pulses, no swelling, no varicosities. Regular rate and rhythm  Lymphatic: No enlargement of neck, axillae, groin.  Skin: No paleness, no jaundice, no cyanosis. No lesion, no ulcer, no rash.  Neurologic / Psychiatric: Oriented to time, oriented to place, oriented to person. No depression, no anxiety, no agitation.  Gastrointestinal: No mass, no tenderness, no rigidity, non obese abdomen. Lower midline scar, small periumbilical scar, well-healed no evidence of hernia  Eyes: Normal conjunctivae. Normal eyelids.  Ears, Nose, Mouth, and Throat: Left ear no scars, no lesions, no masses. Right ear no scars, no lesions, no masses. Nose no scars, no lesions, no masses. Normal hearing. Normal lips.  Musculoskeletal: Normal gait and station of head and neck.     PAST DATA REVIEWED:  Source Of History:  Patient  X-Ray Review: C.T. Abdomen/Pelvis: Reviewed Films. In 2011, the patient had a left lower pole lesion measuring approximately 2 cm. 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 It was clearly present in 2011, although this was not identified. The growth rate appears to be relatively slow, although either way it is an enhancing lesion and concerning for renal cell carcinoma.   The patient has been given the natural history of renal cancer, treatment options, and  recommended surgical extirpation for this patient. I went over the robotic-assisted laparoscopic partial nephrectomy approach. I described for the patient the procedure in detail including port placement. I detailed the postoperative course including the fact that the patient would have both a drain and a Foley catheter following the surgery. I told the patient that most often patients are discharged on postoperative day one or 2. I then detailed the expected recovery time, I told the patient that he would not be able to lift anything greater than 20 pounds for 4 weeks. I also went over the risks and benefits of this operation in great detail. We discussed the risk of injury to surrounding structures, major blood vessels and nerves, bleeding, infection, loss of kidney, and the risk of recurrent cancer.

## 2016-08-21 NOTE — Op Note (Signed)
Preoperative diagnosis:  1. Left renal mass   Postoperative diagnosis:  1. same   Procedure: 1. Robotic assisted laparoscopic left partial nephrectomy  Surgeon: Ardis Hughs, MD 1st assistant: Debbrah Alar, PA-C Resident Assistant: Dr. Dorise Bullion, MD  Anesthesia: General  Complications: None  Intraoperative findings:  #1. Extensive adhesions of the colon and omentum into her pelvis.  #2[name] 1/4 of kidney was removed with the mass. #3. Warm ischemia time 17 minute  EBL: 250cc  Specimens: left renal mass   Indication: Melissa Velazquez is a 66 y.o. patient with left renal mass.  After reviewing the management options for treatment, he elected to proceed with the above surgical procedure(s). We have discussed the potential benefits and risks of the procedure, side effects of the proposed treatment, the likelihood of the patient achieving the goals of the procedure, and any potential problems that might occur during the procedure or recuperation. Informed consent has been obtained.  Description of procedure:  An assistant was required for this surgical procedure.  The duties of the assistant included but were not limited to suctioning, passing suture, camera manipulation, retraction. This procedure would not be able to be performed without an Environmental consultant.  The patient was taken to the operating room and a general anesthetic was administered. The patient was given preoperative antibiotics, placed in the right modified flank position with care to pad all potential pressure points, and prepped and draped in the usual sterile fashion. Next a preoperative timeout was performed.  A site was selected on the left side of the umbilicus for placement of the camera port. This was placed using a standard modified Hassan technique with entry into the peritoneum with a  8 mm tocar. We entered the peritoneum without incident and established pneumoperitoneum. The camera was then used to  inspect the abdomen and there was no evidence of any intra-abdominal injuries or other abnormalities. The remaining abdominal ports were then placed. 8 mm robotic ports were placed in the left upper quadrant, left lower quadrant, and left lateral abdominal wall, making a soft J. A 12 mm port was placed in the upper midline for laparoscopic assistance. All ports were placed under direct vision without difficulty. The surgical cart was then docked.   There were a significant amount of adhesions the patient's descending colon which necessitated dissection or lysis with sharp dissection. There was also omentum adherent to the anterior abdominal wall which we had to take down as well. Once all the adhesions had been removed, the remainder the operation was smooth.  Utilizing the cautery scissors, the white line of Toldt was incised allowing the colon to be mobilized medially and the plane between the mesocolon and the anterior layer of Gerota's fascia to be developed and the kidney to be exposed. The ureter and gonadal vein were identified inferiorly and the ureter was lifted anteriorly off the psoas muscle. Dissection proceeded superiorly along the gonadal vein until the renal vein was identified. The renal hilum was then carefully isolated with a combination of blunt and sharp dissection allowing the renal arterial and venous structures to be separated and isolated in preparation for renal hilar vessel clamping.  Attention turned to the kidney and the perinephric fat surrounding the renal mass was removed and the kidney was mobilized sufficiently for exposure and resection of the renal mass. This was facilitated by intraoperative ultrasound.  Once the renal mass was properly isolated, preparations were made for resection of the tumor. The renal artery was then  clamped with a bulldog clamp x 2. The tumor was then excised with cold scissor dissection along with an adequate visible gross margin of normal  renal parenchyma. The tumor appeared to be excised without any gross violation of the tumor. The renal collecting system was not entered during removal of the tumor. A running 3-0 V-lock suture was then brought through the capsule of the kidney and run along the base of the renal defect to provide hemostasis and close any entry into the renal collecting system if present. Weck clips were used to secure this suture outside the renal capsule at the proximal and distal ends. The bulldog clamps were then removed from the renal hilar vessel. A running 2-0 V lock suture was then used to close the capsule of the kidney using a sliding clip technique which resulted in excellent hemostasis. An additional hemostatic agent (Surgiflo) was then placed into the renal defect.   Total warm renal ischemia time was 17 minutes. The renal tumor resection site was examined. Hemostasis appeared adequate.   The kidney was placed back into its normal anatomic position and covered with perinephric fat as needed. A # 81 Blake drain was then brought through the lateral lower port site and positioned in the perinephric space. It was secured to the skin with a nylon suture. The surgical robotic cart was undocked. The renal tumor specimen was removed intact within an endopouch retrieval bag via the camera port sites. The camera port site and the other 12 mm port site were then closed at the fascial level with 0-vicryl suture. All other laparoscopic/robotic ports were removed under direct vision and the pneumoperitoneum let down with inspection of the operative field performed and hemostasis again confirmed. All incision sites were then injected with local anesthetic and reapproximated at the skin level with 4-0 monocryl subcuticular closures. Dermabond was applied to the skin. The patient tolerated the procedure well and without complications. The patient was able to be extubated and transferred to the recovery unit in satisfactory  condition.  Ardis Hughs, M.D.

## 2016-08-21 NOTE — Transfer of Care (Signed)
Immediate Anesthesia Transfer of Care Note  Patient: Melissa Velazquez  Procedure(s) Performed: Procedure(s): XI ROBOTIC ASSITED PARTIAL NEPHRECTOMY (Left)  Patient Location: PACU  Anesthesia Type:General  Level of Consciousness:  sedated, patient cooperative and responds to stimulation  Airway & Oxygen Therapy:Patient Spontanous Breathing and Patient connected to face mask oxgen  Post-op Assessment:  Report given to PACU RN and Post -op Vital signs reviewed and stable  Post vital signs:  Reviewed and stable  Last Vitals:  Vitals:   08/21/16 0955 08/21/16 1738  BP: (!) 154/82   Pulse: 67   Resp: 16 (P) 14  Temp: 37.1 C (P) 30.9 C    Complications: No apparent anesthesia complications

## 2016-08-22 LAB — BASIC METABOLIC PANEL
ANION GAP: 7 (ref 5–15)
BUN: 11 mg/dL (ref 6–20)
CHLORIDE: 106 mmol/L (ref 101–111)
CO2: 26 mmol/L (ref 22–32)
CREATININE: 0.87 mg/dL (ref 0.44–1.00)
Calcium: 8.2 mg/dL — ABNORMAL LOW (ref 8.9–10.3)
Glucose, Bld: 187 mg/dL — ABNORMAL HIGH (ref 65–99)
POTASSIUM: 5.1 mmol/L (ref 3.5–5.1)
Sodium: 139 mmol/L (ref 135–145)

## 2016-08-22 LAB — CREATININE, FLUID (PLEURAL, PERITONEAL, JP DRAINAGE): Creat, Fluid: 0.9 mg/dL

## 2016-08-22 LAB — HEMOGLOBIN AND HEMATOCRIT, BLOOD
HEMATOCRIT: 35.8 % — AB (ref 36.0–46.0)
HEMOGLOBIN: 12.2 g/dL (ref 12.0–15.0)

## 2016-08-22 MED ORDER — BISACODYL 10 MG RE SUPP
10.0000 mg | Freq: Once | RECTAL | Status: AC
  Administered 2016-08-22: 10 mg via RECTAL
  Filled 2016-08-22: qty 1

## 2016-08-22 MED ORDER — MAGIC MOUTHWASH
5.0000 mL | Freq: Four times a day (QID) | ORAL | Status: DC | PRN
  Administered 2016-08-22: 5 mL via ORAL
  Filled 2016-08-22: qty 5

## 2016-08-22 NOTE — Progress Notes (Signed)
Patient ID: Melissa Velazquez, female   DOB: [birth year], 67 y.o.   MRN: [id number]  1 Day Post-Op Subjective: Pt doing well.  No nausea or vomiting.  No flatus.  Pain controlled.  Objective: Vital signs in last 24 hours: Temp:  [97.8 F (36.6 C)-98.5 F (36.9 C)] 98 F (36.7 C) (08/04 1003) Pulse Rate:  [64-73] 73 (08/04 1003) Resp:  [12-18] 16 (08/04 1003) BP: (123-158)/(66-100) 123/68 (08/04 1003) SpO2:  [96 %-100 %] 96 % (08/04 1003)  Intake/Output from previous day: 08/03 0701 - 08/04 0700 In: 4081.3 [I.V.:3881.3; IV Piggyback:200] Out: 0802 [Urine:1450; Drains:140; Blood:250] Intake/Output this shift: Total I/O In: 375 [P.O.:150; I.V.:225] Out: 400 [Urine:400]  Physical Exam:  General: Alert and oriented CV: RRR Lungs: Clear Abdomen: Soft, ND, positive bowel sounds Incisions: C/D/I Ext: NT, No erythema  Lab Results:  Recent Labs  08/21/16 1758 08/22/16 0430  HGB 13.9 12.2  HCT 40.4 35.8*   BMET  Recent Labs  08/22/16 0430  NA 139  K 5.1  CL 106  CO2 26  GLUCOSE 187*  BUN 11  CREATININE 0.87  CALCIUM 8.2*     Studies/Results: Pathology pending.  Assessment/Plan: POD # 1 s/p left RAL partial nephrectomy - Ambulate, IS, DVT prophylaxis - Transition to po pain meds - Advance diet - D/C Foley - Decreased IVF if tolerating diet  - Monitor renal function, drain output    LOS: 1 day   Melissa Velazquez,Melissa Velazquez 08/22/2016, 10:44 AM

## 2016-08-22 NOTE — Anesthesia Postprocedure Evaluation (Signed)
Anesthesia Post Note  Patient: Melissa Velazquez  Procedure(s) Performed: Procedure(s) (LRB): XI ROBOTIC ASSITED PARTIAL NEPHRECTOMY (Left)     Patient location during evaluation: PACU Anesthesia Type: General Level of consciousness: awake and alert Pain management: pain level controlled Vital Signs Assessment: post-procedure vital signs reviewed and stable Respiratory status: spontaneous breathing, nonlabored ventilation, respiratory function stable and patient connected to nasal cannula oxygen Cardiovascular status: blood pressure returned to baseline and stable Postop Assessment: no signs of nausea or vomiting Anesthetic complications: no    Last Vitals:  Vitals:   08/22/16 1003 08/22/16 1420  BP: 123/68 (!) 98/52  Pulse: 73 68  Resp: 16 14  Temp: 36.7 C 36.8 C    Last Pain:  Vitals:   08/22/16 1420  TempSrc: Oral  PainSc:                  Latriece Anstine S

## 2016-08-23 ENCOUNTER — Encounter (HOSPITAL_COMMUNITY): Payer: Self-pay | Admitting: Urology

## 2016-08-23 LAB — HEMOGLOBIN AND HEMATOCRIT, BLOOD
HCT: 35.1 % — ABNORMAL LOW (ref 36.0–46.0)
Hemoglobin: 11.7 g/dL — ABNORMAL LOW (ref 12.0–15.0)

## 2016-08-23 LAB — BASIC METABOLIC PANEL
Anion gap: 7 (ref 5–15)
BUN: 11 mg/dL (ref 6–20)
CALCIUM: 8.3 mg/dL — AB (ref 8.9–10.3)
CHLORIDE: 104 mmol/L (ref 101–111)
CO2: 28 mmol/L (ref 22–32)
CREATININE: 0.93 mg/dL (ref 0.44–1.00)
GFR calc Af Amer: >60 mL/min (ref >60)
GFR calc non Af Amer: >60 mL/min (ref >60)
GLUCOSE: 119 mg/dL — AB (ref 65–99)
Potassium: 4 mmol/L (ref 3.5–5.1)
Sodium: 139 mmol/L (ref 135–145)

## 2016-08-23 NOTE — Progress Notes (Signed)
Patient ID: Melissa Velazquez, female   DOB: 07-06-1950, 66 y.o.   MRN: [id number]  2 Days Post-Op Subjective: Pt doing well this morning.  She was having increased pain although did not realize she had to ask for her pain medication.  Tramadol did work well when she finally took it.  Ambulating well and tolerating diet. Small BM yesterday.  Objective: Vital signs in last 24 hours: Temp:  [98.2 F (36.8 C)-99.1 F (37.3 C)] 99.1 F (37.3 C) (08/05 0513) Pulse Rate:  [68-79] 79 (08/05 0513) Resp:  [14-16] 16 (08/05 0513) BP: (98-133)/(52-65) 115/64 (08/05 0513) SpO2:  [94 %-97 %] 94 % (08/05 0513)  Intake/Output from previous day: 08/04 0701 - 08/05 0700 In: 750 [P.O.:150; I.V.:600] Out: 1095 [Urine:1025; Drains:70] Intake/Output this shift: Total I/O In: -  Out: 305 [Urine:275; Drains:30]  Physical Exam:  General: Alert and oriented Abdomen: Soft, ND, Positive BS Incisions: C/D/I Ext: NT, No erythema  Lab Results:  Recent Labs  08/21/16 1758 08/22/16 0430 08/23/16 0410  HGB 13.9 12.2 11.7*  HCT 40.4 35.8* 35.1*   BMET  Recent Labs  08/22/16 0430 08/23/16 0410  NA 139 139  K 5.1 4.0  CL 106 104  CO2 26 28  GLUCOSE 187* 119*  BUN 11 11  CREATININE 0.87 0.93  CALCIUM 8.2* 8.3*   Drain Cr c/w serum.  Studies/Results: No results found.  Assessment/Plan: POD # 2 s/p RAL left partial nephrectomy - D/C drain - Instructed on how to take pain medication - D/C home   LOS: 2 days   Tobi Leinweber,LES 08/23/2016, 10:30 AM

## 2016-08-23 NOTE — Discharge Summary (Signed)
  Date of admission: 08/21/2016  Date of discharge: 08/23/2016  Admission diagnosis: Left renal neoplasm  Discharge diagnosis: Left renal neoplasm  Secondary diagnoses: Hypothyroidism, depression  History and Physical: For full details, please see admission history and physical. Briefly, Melissa Velazquez is a 66 y.o. year old patient with a left renal neoplasm.   Hospital Course: She underwent a left RAL partial nephrectomy on 08/21/16.  By POD # 1 , she was stable and was able to begin ambulating.  Her diet was advanced and she had return of bowel function.  She was transitioned to po pain medication.  Her drain Cr was c/w serum and was removed.  She was stable for d/c home on POD #2.  Laboratory values:  Recent Labs  08/21/16 1758 08/22/16 0430 08/23/16 0410  HGB 13.9 12.2 11.7*  HCT 40.4 35.8* 35.1*    Recent Labs  08/22/16 0430 08/23/16 0410  CREATININE 0.87 0.93    Disposition: Home  Discharge instruction: The patient was instructed to be ambulatory but told to refrain from heavy lifting, strenuous activity, or driving.   Discharge medications:  Allergies as of 08/23/2016      Reactions   Triple Antibiotic [bacitracin-neomycin-polymyxin] Anaphylaxis   Levofloxacin Other (See Comments)   Stomach upset   Capsaicin Rash   Elastic Bandages & [zinc] Rash      Medication List    STOP taking these medications   metroNIDAZOLE 500 MG tablet Commonly known as:  FLAGYL   OVER THE COUNTER MEDICATION     TAKE these medications   albuterol 108 (90 Base) MCG/ACT inhaler Commonly known as:  PROVENTIL HFA;VENTOLIN HFA Inhale 2 puffs into the lungs every 6 (six) hours as needed for wheezing or shortness of breath.   clonazePAM 0.5 MG tablet Commonly known as:  KLONOPIN Take 0.5 mg by mouth 2 (two) times daily as needed (for anxiety/sleep.).   levothyroxine 75 MCG tablet Commonly known as:  SYNTHROID, LEVOTHROID Take 75 mcg by mouth daily before breakfast.   sertraline 100  MG tablet Commonly known as:  ZOLOFT Take 150 mg by mouth daily with breakfast.   traMADol 50 MG tablet Commonly known as:  ULTRAM Take 1-2 tablets (50-100 mg total) by mouth every 6 (six) hours as needed for moderate pain or severe pain.       Followup:  Follow-up Information    Ardis Hughs, MD Follow up on 09/07/2016.   Specialty:  Urology Why:  at 8:45 Contact information: [address] [place] [address] [phone]

## 2016-09-07 DIAGNOSIS — D3000 Benign neoplasm of unspecified kidney: Secondary | ICD-10-CM | POA: Diagnosis not present

## 2016-09-16 DIAGNOSIS — F341 Dysthymic disorder: Secondary | ICD-10-CM | POA: Diagnosis not present

## 2016-10-14 DIAGNOSIS — F341 Dysthymic disorder: Secondary | ICD-10-CM | POA: Diagnosis not present

## 2016-10-20 DIAGNOSIS — Z23 Encounter for immunization: Secondary | ICD-10-CM | POA: Diagnosis not present

## 2016-11-05 ENCOUNTER — Ambulatory Visit (INDEPENDENT_AMBULATORY_CARE_PROVIDER_SITE_OTHER): Payer: Medicare Other | Admitting: Internal Medicine

## 2016-11-09 ENCOUNTER — Ambulatory Visit (INDEPENDENT_AMBULATORY_CARE_PROVIDER_SITE_OTHER): Payer: Medicare Other | Admitting: Internal Medicine

## 2016-11-25 ENCOUNTER — Ambulatory Visit (INDEPENDENT_AMBULATORY_CARE_PROVIDER_SITE_OTHER): Payer: Medicare Other | Admitting: Internal Medicine

## 2016-11-25 ENCOUNTER — Encounter (INDEPENDENT_AMBULATORY_CARE_PROVIDER_SITE_OTHER): Payer: Self-pay | Admitting: Internal Medicine

## 2016-11-25 ENCOUNTER — Encounter (INDEPENDENT_AMBULATORY_CARE_PROVIDER_SITE_OTHER): Payer: Self-pay | Admitting: *Deleted

## 2016-11-25 ENCOUNTER — Telehealth (INDEPENDENT_AMBULATORY_CARE_PROVIDER_SITE_OTHER): Payer: Self-pay | Admitting: *Deleted

## 2016-11-25 VITALS — BP 102/72 | HR 64 | Temp 97.9°F | Ht 64.0 in | Wt 223.0 lb

## 2016-11-25 DIAGNOSIS — Z86010 Personal history of colonic polyps: Secondary | ICD-10-CM | POA: Diagnosis not present

## 2016-11-25 DIAGNOSIS — F341 Dysthymic disorder: Secondary | ICD-10-CM | POA: Diagnosis not present

## 2016-11-25 MED ORDER — PEG 3350-KCL-NA BICARB-NACL 420 G PO SOLR: 4000.0000 mL | Freq: Once | ORAL | 0 refills | Status: AC

## 2016-11-25 NOTE — Telephone Encounter (Signed)
Patient needs trilyte 

## 2016-11-25 NOTE — Patient Instructions (Signed)
The risks of bleeding, perforation and infection were reviewed with patient.  

## 2016-11-25 NOTE — Progress Notes (Signed)
Subjective:    Patient ID: Melissa Velazquez, female    DOB: 03-03-1950, 66 y.o.   MRN: [id number]  HPI Presents today with c/o diarrhea.  Last seen 07/31/2016. Hx of IBS.  She is having urgency at times. Her stools is always mushy, never firm in months.  She does have some diarrhea at times.  She wants to make sure it is not cancer.  She says she has had symptoms for a couple of months. Stool for blood was negative at Dr. Cornelia Copa office.  In August GI pathogen was negative.   Recently underwent a robotic assisted laproscopic left  partial nephrectomy in August for renal cell carcinoma.   11/24/2012 Colonoscopy with snare polypectomy: Hx of tubulovillous adenoma with high grade dysplasia in 2006.  Prep satisfactory. 10 mm sessile polyp snared from ascending colon. Small polyp was cold snared from descending colon and residual polyp ablated with snare tip. 2 small polyps at descending colon were coagulated. Two small polyps from sigmoid colon were snared(one half snared and one cold snared). Scattered diverticula at sigmoid colon. Small hemorrhoids below the dentate line.  Polyps are hyperplastic. Previously she had tubulovillous adenoma with high-grade dysplasia in 2006. Patient called and message left on her answering service. Next colonoscopy in 5 years   Review of Systems Past Medical History:  Diagnosis Date  . Allergic rhinitis   . Anal fissure    Hx of   . Anxiety    hx of  . Arthritis   . Asthma    none in last year   . Depression    hx of  . Diabetes mellitus without complication (HCC)    no meds  . Diverticular disease   . Dysrhythmia    palpitations  . Endometrial cancer ([place]) 2001   spread to appendix   . Hemorrhoids   . Hyperglycemia   . Hypertension   . Hypothyroidism   . Obesity   . Sebaceous cyst   . Thyroid disease   . Thyroid nodule   . 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  APPENDECTOMY  2008  . CHOLECYSTECTOMY  2008  . TOTAL ABDOMINAL HYSTERECTOMY  2001  . WISDOM TOOTH EXTRACTION      Allergies  Allergen Reactions  . Triple Antibiotic [Bacitracin-Neomycin-Polymyxin] Anaphylaxis  . Levofloxacin Other (See Comments)    Stomach upset  . Capsaicin Rash  . Elastic Bandages & [Zinc] Rash    Current Outpatient Medications on File Prior to Visit  Medication Sig Dispense Refill  . albuterol (PROVENTIL HFA;VENTOLIN HFA) 108 (90 Base) MCG/ACT inhaler Inhale 2 puffs into the lungs every 6 (six) hours as needed for wheezing or shortness of breath.    . levothyroxine (SYNTHROID, LEVOTHROID) 75 MCG tablet Take 75 mcg by mouth daily before breakfast.     . sertraline (ZOLOFT) 100 MG tablet Take 150 mg by mouth daily with breakfast.      No current facility-administered medications on file prior to visit.         Objective:   Physical Exam Blood pressure 102/72, pulse 64, temperature 97.9 F (36.6 C), height 5\' 4"  (1.626 m), weight 223 lb (101.2 kg). Alert and oriented. Skin warm and dry. Oral mucosa is moist.   . Sclera anicteric, conjunctivae is pink. Thyroid not enlarged. No cervical lymphadenopathy. Lungs clear. Heart regular rate and rhythm.  Abdomen is soft. Bowel sounds are positive. No hepatomegaly. No abdominal masses felt.  No tenderness.  No edema to lower extremities           Assessment & Plan:  Colon polyp (Patient is requesting to proceed early for her colonoscopy).  The risks of bleeding, perforation and infection were reviewed with patient.

## 2016-12-08 ENCOUNTER — Telehealth (INDEPENDENT_AMBULATORY_CARE_PROVIDER_SITE_OTHER): Payer: Self-pay | Admitting: *Deleted

## 2016-12-08 ENCOUNTER — Telehealth (INDEPENDENT_AMBULATORY_CARE_PROVIDER_SITE_OTHER): Payer: Self-pay | Admitting: Internal Medicine

## 2016-12-08 DIAGNOSIS — R197 Diarrhea, unspecified: Secondary | ICD-10-CM

## 2016-12-08 NOTE — Telephone Encounter (Signed)
GI pathogen. Imodium BID

## 2016-12-08 NOTE — Telephone Encounter (Signed)
I have spoken with patient,. GI pathogen ordered

## 2016-12-08 NOTE — Telephone Encounter (Signed)
P/C from patient -- C/O some diarrhea and RLQ abd pain -- wants to talk to you -- TCS sch'd 12/14/16 -- please call [phone]

## 2016-12-09 ENCOUNTER — Telehealth (INDEPENDENT_AMBULATORY_CARE_PROVIDER_SITE_OTHER): Payer: Self-pay | Admitting: *Deleted

## 2016-12-09 NOTE — Telephone Encounter (Signed)
Patient left message, she was unable to complete pathogen study-- she will proceed with TCS Monday 12/14/16

## 2016-12-09 NOTE — Telephone Encounter (Signed)
noted 

## 2016-12-14 ENCOUNTER — Other Ambulatory Visit: Payer: Self-pay

## 2016-12-14 ENCOUNTER — Encounter (HOSPITAL_COMMUNITY)
Admission: RE | Disposition: A | Discharge status: Home / Self Care | Payer: Self-pay | Source: Ambulatory Visit | Attending: Internal Medicine

## 2016-12-14 ENCOUNTER — Encounter (HOSPITAL_COMMUNITY): Payer: Self-pay

## 2016-12-14 ENCOUNTER — Ambulatory Visit (HOSPITAL_COMMUNITY)
Admission: RE | Admit: 2016-12-14 | Discharge: 2016-12-14 | Disposition: A | Discharge status: Home / Self Care | Payer: Medicare Other | Source: Ambulatory Visit | Attending: Internal Medicine | Admitting: Internal Medicine

## 2016-12-14 DIAGNOSIS — K5730 Diverticulosis of large intestine without perforation or abscess without bleeding: Secondary | ICD-10-CM | POA: Insufficient documentation

## 2016-12-14 DIAGNOSIS — I10 Essential (primary) hypertension: Secondary | ICD-10-CM | POA: Insufficient documentation

## 2016-12-14 DIAGNOSIS — Z86010 Personal history of colonic polyps: Secondary | ICD-10-CM | POA: Diagnosis not present

## 2016-12-14 DIAGNOSIS — E039 Hypothyroidism, unspecified: Secondary | ICD-10-CM | POA: Diagnosis not present

## 2016-12-14 DIAGNOSIS — Z85528 Personal history of other malignant neoplasm of kidney: Secondary | ICD-10-CM | POA: Diagnosis not present

## 2016-12-14 DIAGNOSIS — D120 Benign neoplasm of cecum: Secondary | ICD-10-CM | POA: Diagnosis not present

## 2016-12-14 DIAGNOSIS — J45909 Unspecified asthma, uncomplicated: Secondary | ICD-10-CM | POA: Insufficient documentation

## 2016-12-14 DIAGNOSIS — Z8542 Personal history of malignant neoplasm of other parts of uterus: Secondary | ICD-10-CM | POA: Diagnosis not present

## 2016-12-14 DIAGNOSIS — F419 Anxiety disorder, unspecified: Secondary | ICD-10-CM | POA: Diagnosis not present

## 2016-12-14 DIAGNOSIS — Z8719 Personal history of other diseases of the digestive system: Secondary | ICD-10-CM | POA: Insufficient documentation

## 2016-12-14 DIAGNOSIS — K644 Residual hemorrhoidal skin tags: Secondary | ICD-10-CM | POA: Diagnosis not present

## 2016-12-14 DIAGNOSIS — E119 Type 2 diabetes mellitus without complications: Secondary | ICD-10-CM | POA: Insufficient documentation

## 2016-12-14 DIAGNOSIS — Z87891 Personal history of nicotine dependence: Secondary | ICD-10-CM | POA: Insufficient documentation

## 2016-12-14 DIAGNOSIS — Z09 Encounter for follow-up examination after completed treatment for conditions other than malignant neoplasm: Secondary | ICD-10-CM | POA: Diagnosis not present

## 2016-12-14 DIAGNOSIS — D121 Benign neoplasm of appendix: Secondary | ICD-10-CM | POA: Diagnosis not present

## 2016-12-14 DIAGNOSIS — Z1211 Encounter for screening for malignant neoplasm of colon: Secondary | ICD-10-CM | POA: Diagnosis not present

## 2016-12-14 DIAGNOSIS — F329 Major depressive disorder, single episode, unspecified: Secondary | ICD-10-CM | POA: Insufficient documentation

## 2016-12-14 HISTORY — PX: BIOPSY: SHX5522

## 2016-12-14 HISTORY — PX: POLYPECTOMY: SHX5525

## 2016-12-14 HISTORY — PX: COLONOSCOPY: SHX5424

## 2016-12-14 SURGERY — COLONOSCOPY
Anesthesia: Moderate Sedation

## 2016-12-14 MED ORDER — SODIUM CHLORIDE 0.9 % IV SOLN
INTRAVENOUS | Status: DC
  Administered 2016-12-14: 11:00:00 via INTRAVENOUS

## 2016-12-14 MED ORDER — MIDAZOLAM HCL 5 MG/5ML IJ SOLN
INTRAMUSCULAR | Status: DC | PRN
  Administered 2016-12-14: 1 mg via INTRAVENOUS
  Administered 2016-12-14 (×2): 2 mg via INTRAVENOUS
  Administered 2016-12-14 (×2): 1 mg via INTRAVENOUS
  Administered 2016-12-14: 2 mg via INTRAVENOUS

## 2016-12-14 MED ORDER — MEPERIDINE HCL 50 MG/ML IJ SOLN
INTRAMUSCULAR | Status: AC
  Filled 2016-12-14: qty 1

## 2016-12-14 MED ORDER — MEPERIDINE HCL 50 MG/ML IJ SOLN
INTRAMUSCULAR | Status: DC | PRN
  Administered 2016-12-14 (×2): 25 mg via INTRAVENOUS

## 2016-12-14 MED ORDER — SIMETHICONE 40 MG/0.6ML PO SUSP
ORAL | Status: DC | PRN
  Administered 2016-12-14: 12:00:00

## 2016-12-14 MED ORDER — DICYCLOMINE HCL 10 MG PO CAPS: 10.0000 mg | ORAL_CAPSULE | Freq: Two times a day (BID) | ORAL | 5 refills | Status: DC

## 2016-12-14 MED ORDER — MIDAZOLAM HCL 5 MG/5ML IJ SOLN
INTRAMUSCULAR | Status: AC
  Filled 2016-12-14: qty 10

## 2016-12-14 NOTE — H&P (Signed)
Melissa Velazquez is an 66 y.o. female.   Chief Complaint: Patient is here for colonoscopy. HPI: Patient is 66 year old Caucasian female who has a history of colonic polyps.  She had a tubulovillous adenoma with high-grade dysplasia removed in 2006.  Most recent exam was about 4 years ago with removal of multiple small polyps and these were hyperplastic.  She has a history of IBS.  She used to have 2-3 bowel movements per day.  Now she is having 4-5 there is soft.  Change in bowel habits started over 6 months ago.  GI pathogen panel in July 2018 was negative.  She has lost about 15 pounds.  She underwent wedge resection for left renal cell carcinoma in August 2018. History is negative for CRC.  Past Medical History:  Diagnosis Date  . Allergic rhinitis   . Anal fissure    Hx of   . Anxiety    hx of  . Arthritis   . Asthma    none in last year   . Depression    hx of  . Diabetes mellitus without complication (HCC)    no meds  . Diverticular disease   . Dysrhythmia    palpitations  . Endometrial cancer ([place]) 2001   spread to appendix   . Hemorrhoids       . Hypertension   . Hypothyroidism   . Obesity   . Sebaceous cyst   . Thyroid disease   . Thyroid nodule   . 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 APPENDECTOMY  2008  . CHOLECYSTECTOMY  2008  . COLONOSCOPY N/A 11/24/2012   Procedure: COLONOSCOPY;  Surgeon: Rogene Houston, MD;  Location: [facility];  Service: Endoscopy;  Laterality: N/A;  830  . ROBOTIC ASSITED PARTIAL NEPHRECTOMY Left 08/21/2016   Procedure: XI ROBOTIC ASSITED PARTIAL NEPHRECTOMY;  Surgeon: Ardis Hughs, MD;  Location: [facility];  Service: Urology;  Laterality: Left;  . TOTAL ABDOMINAL HYSTERECTOMY  2001  . WISDOM TOOTH EXTRACTION      Family History  Problem Relation Age of Onset  . Breast cancer Mother   . Diabetes Mother   . Obesity Sister   . Asthma Sister   . COPD Father    smoked  . Prostate cancer Father   . Breast cancer Maternal Aunt   . Colon cancer Neg Hx    Social History:  reports that she quit smoking about 18 years ago. Her smoking use included cigarettes. She has a 20.00 pack-year smoking history. she has never used smokeless tobacco. She reports that she drinks alcohol. She reports that she does not use drugs.  Allergies:  Allergies  Allergen Reactions  . Triple Antibiotic [Bacitracin-Neomycin-Polymyxin] Anaphylaxis  . Levofloxacin Other (See Comments)    Stomach upset  . Capsaicin Rash  . Elastic Bandages & [Zinc] Rash    Medications Prior to Admission  Medication Sig Dispense Refill  . celecoxib (CELEBREX) 200 MG capsule Take 200 mg by mouth daily as needed (for arthritis pain.).    Marland Kitchen cetirizine (ZYRTEC) 10 MG tablet Take 10 mg by mouth daily as needed for allergies.    Marland Kitchen levothyroxine (SYNTHROID, LEVOTHROID) 75 MCG tablet Take 75 mcg by mouth daily before breakfast.     . sertraline (ZOLOFT) 100 MG tablet Take 150 mg by mouth daily with breakfast.     . triamcinolone cream (KENALOG) 0.1 % Apply 1 application topically daily as needed (  rash).    . albuterol (PROVENTIL HFA;VENTOLIN HFA) 108 (90 Base) MCG/ACT inhaler Inhale 2 puffs into the lungs every 6 (six) hours as needed for wheezing or shortness of breath.    . fluticasone (FLONASE) 50 MCG/ACT nasal spray Place 1 spray into both nostrils daily as needed for allergies or rhinitis.      No results found for this or any previous visit (from the past 48 hour(s)). No results found.  ROS  Blood pressure 126/73, pulse 67, temperature 98.2 F (36.8 C), temperature source Oral, resp. rate 18, SpO2 97 %. Physical Exam  Constitutional: She appears well-developed and well-nourished.  HENT:  Mouth/Throat: Oropharynx is clear and moist.  Eyes: Conjunctivae are normal. No scleral icterus.  Neck: No thyromegaly present.  Cardiovascular: Normal rate, regular rhythm and normal heart sounds.  No  murmur heard. Respiratory: Effort normal and breath sounds normal.  GI:  Abdomen is full.  Horizontal scar noted in right lower quadrant.  Small infraumbilical scar noted along with laparoscopy scar at left mid abdomen.  Abdomen is soft  without organomegaly masses or tenderness noted.  Musculoskeletal: She exhibits no edema.  Lymphadenopathy:    She has no cervical adenopathy.  Neurological: She is alert.  Skin: Skin is warm and dry.     Assessment/Plan History of colonic polyps. History of IBS.  Change in bowel habits about 6 months ago. Surveillance colonoscopy.  Hildred Laser, MD 12/14/2016, 11:41 AM

## 2016-12-14 NOTE — Discharge Instructions (Signed)
No aspirin or NSAIDs for 1 week. Resume other medications as before. High fiber diet. Dicyclomine 10 mg by mouth 30 minutes before breakfast and lunch daily. No driving for 24 hours. Physician will call with biopsy results.   Colonoscopy, Adult, Care After This sheet gives you information about how to care for yourself after your procedure. Your health care provider may also give you more specific instructions. If you have problems or questions, contact your health care provider. What can I expect after the procedure? After the procedure, it is common to have:  A small amount of blood in your stool for 24 hours after the procedure.  Some gas.  Mild abdominal cramping or bloating.  Follow these instructions at home: General instructions   For the first 24 hours after the procedure: ? Do not drive or use machinery. ? Do not sign important documents. ? Do not drink alcohol. ? Do your regular daily activities at a slower pace than normal. ? Eat soft, easy-to-digest foods. ? Rest often.  Take over-the-counter or prescription medicines only as told by your health care provider.  It is up to you to get the results of your procedure. Ask your health care provider, or the department performing the procedure, when your results will be ready. Relieving cramping and bloating  Try walking around when you have cramps or feel bloated.  Apply heat to your abdomen as told by your health care provider. Use a heat source that your health care provider recommends, such as a moist heat pack or a heating pad. ? Place a towel between your skin and the heat source. ? Leave the heat on for 20-30 minutes. ? Remove the heat if your skin turns bright red. This is especially important if you are unable to feel pain, heat, or cold. You may have a greater risk of getting burned. Eating and drinking  Drink enough fluid to keep your urine clear or pale yellow.  Resume your normal diet as instructed by  your health care provider. Avoid heavy or fried foods that are hard to digest.  Avoid drinking alcohol for as long as instructed by your health care provider. Contact a health care provider if:  You have blood in your stool 2-3 days after the procedure. Get help right away if:  You have more than a small spotting of blood in your stool.  You pass large blood clots in your stool.  Your abdomen is swollen.  You have nausea or vomiting.  You have a fever.  You have increasing abdominal pain that is not relieved with medicine. This information is not intended to replace advice given to you by your health care provider. Make sure you discuss any questions you have with your health care provider. Document Released: 08/20/2003 Document Revised: 09/30/2015 Document Reviewed: 03/19/2015 Elsevier Interactive Patient Education  2018 [employer].   High-Fiber Diet Fiber, also called dietary fiber, is a type of carbohydrate found in fruits, vegetables, whole grains, and beans. A high-fiber diet can have many health benefits. Your health care provider may recommend a high-fiber diet to help:  Prevent constipation. Fiber can make your bowel movements more regular.  Lower your cholesterol.  Relieve hemorrhoids, uncomplicated diverticulosis, or irritable bowel syndrome.  Prevent overeating as part of a weight-loss plan.  Prevent heart disease, type 2 diabetes, and certain cancers.  What is my plan? The recommended daily intake of fiber includes:  38 grams for men under age 51.  30 grams for men over  age 47.  25 grams for women under age 63.  65 grams for women over age 24.  You can get the recommended daily intake of dietary fiber by eating a variety of fruits, vegetables, grains, and beans. Your health care provider may also recommend a fiber supplement if it is not possible to get enough fiber through your diet. What do I need to know about a high-fiber diet?  Fiber supplements  have not been widely studied for their effectiveness, so it is better to get fiber through food sources.  Always check the fiber content on thenutrition facts label of any prepackaged food. Look for foods that contain at least 5 grams of fiber per serving.  Ask your dietitian if you have questions about specific foods that are related to your condition, especially if those foods are not listed in the following section.  Increase your daily fiber consumption gradually. Increasing your intake of dietary fiber too quickly may cause bloating, cramping, or gas.  Drink plenty of water. Water helps you to digest fiber. What foods can I eat? Grains Whole-grain breads. Multigrain cereal. Oats and oatmeal. Brown rice. Barley. Bulgur wheat. [place]. Bran muffins. Popcorn. Rye wafer crackers. Vegetables Sweet potatoes. Spinach. Kale. Artichokes. Cabbage. Broccoli. Green peas. Carrots. Squash. Fruits Berries. Pears. Apples. Oranges. Avocados. Prunes and raisins. Dried figs. Meats and Other Protein Sources Navy, kidney, pinto, and soy beans. Split peas. Lentils. Nuts and seeds. Dairy Fiber-fortified yogurt. Beverages Fiber-fortified soy milk. Fiber-fortified orange juice. Other Fiber bars. The items listed above may not be a complete list of recommended foods or beverages. Contact your dietitian for more options. What foods are not recommended? Grains White bread. Pasta made with refined flour. White rice. Vegetables Fried potatoes. Canned vegetables. Well-cooked vegetables. Fruits Fruit juice. Cooked, strained fruit. Meats and Other Protein Sources Fatty cuts of meat. Fried Sales executive or fried fish. Dairy Milk. Yogurt. Cream cheese. Sour cream. Beverages Soft drinks. Other Cakes and pastries. Butter and oils. The items listed above may not be a complete list of foods and beverages to avoid. Contact your dietitian for more information. What are some tips for including high-fiber foods in my  diet?  Eat a wide variety of high-fiber foods.  Make sure that half of all grains consumed each day are whole grains.  Replace breads and cereals made from refined flour or white flour with whole-grain breads and cereals.  Replace white rice with brown rice, bulgur wheat, or millet.  Start the day with a breakfast that is high in fiber, such as a cereal that contains at least 5 grams of fiber per serving.  Use beans in place of meat in soups, salads, or pasta.  Eat high-fiber snacks, such as berries, raw vegetables, nuts, or popcorn. This information is not intended to replace advice given to you by your health care provider. Make sure you discuss any questions you have with your health care provider. Document Released: 01/05/2005 Document Revised: 06/13/2015 Document Reviewed: 06/20/2013 Elsevier Interactive Patient Education  2017 [place] tablets or capsules What is this medicine? DICYCLOMINE (dye SYE kloe meen) is used to treat bowel problems including irritable bowel syndrome. This medicine may be used for other purposes; ask your health care provider or pharmacist if you have questions. COMMON BRAND NAME(S): Bentyl What should I tell my health care provider before I take this medicine? They need to know if you have any of these conditions: -difficulty passing urine -esophagus problems or heartburn -glaucoma -heart disease, or  previous heart attack -myasthenia gravis -prostate trouble -stomach infection, or obstruction -ulcerative colitis -an unusual or allergic reaction to dicyclomine, other medicines, foods, dyes, or preservatives -pregnant or trying to get pregnant -breast-feeding How should I use this medicine? Take this medicine by mouth with a glass of water. Follow the directions on the prescription label. It is best to take this medicine on an empty stomach, 30 minutes to 1 hour before meals. Take your medicine at regular intervals. Do not take  your medicine more often than directed. Talk to your pediatrician regarding the use of this medicine in children. Special care may be needed. While this drug may be prescribed for children as young as 75 months of age for selected conditions, precautions do apply. Patients over 47 years old may have a stronger reaction and need a smaller dose. Overdosage: If you think you have taken too much of this medicine contact a poison control center or emergency room at once. NOTE: This medicine is only for you. Do not share this medicine with others. What if I miss a dose? If you miss a dose, take it as soon as you can. If it is almost time for your next dose, take only that dose. Do not take double or extra doses. What may interact with this medicine? -amantadine -antacids -benztropine -digoxin -disopyramide -medicines for allergies, colds and breathing difficulties -medicines for alzheimer's disease -medicines for anxiety or sleeping problems -medicines for depression or psychotic disturbances -medicines for diarrhea -medicines for pain -metoclopramide -tegaserod This list may not describe all possible interactions. Give your health care provider a list of all the medicines, herbs, non-prescription drugs, or dietary supplements you use. Also tell them if you smoke, drink alcohol, or use illegal drugs. Some items may interact with your medicine. What should I watch for while using this medicine? You may get drowsy, dizzy, or have blurred vision. Do not drive, use machinery, or do anything that needs mental alertness until you know how this medicine affects you. To reduce the risk of dizzy or fainting spells, do not sit or stand up quickly, especially if you are an older patient. Alcohol can make you more drowsy, avoid alcoholic drinks. Stay out of bright light and wear sunglasses if this medicine makes your eyes more sensitive to light. Avoid extreme heat (hot tubs, saunas). This medicine can cause  you to sweat less than normal. Your body temperature could increase to dangerous levels, which may lead to heat stroke. Antacids can stop this medicine from working. If you get an upset stomach and want to take an antacid, make sure there is an interval of at least 1 to 2 hours before or after you take this medicine. Your mouth may get dry. Chewing sugarless gum or sucking hard candy, and drinking plenty of water may help. Contact your doctor if the problem does not go away or is severe. What side effects may I notice from receiving this medicine? Side effects that you should report to your doctor or health care professional as soon as possible: -agitation, nervousness, confusion -difficulty swallowing -dizziness, drowsiness -fast or slow heartbeat -hallucinations -pain or difficulty passing urine Side effects that usually do not require medical attention (report to your doctor or health care professional if they continue or are bothersome): -constipation -headache -nausea or vomiting -sexual difficulty This list may not describe all possible side effects. Call your doctor for medical advice about side effects. You may report side effects to FDA at 1-800-FDA-1088. Where should I keep  my medicine? Keep out of the reach of children. Store at room temperature below 30 degrees C (86 degrees F). Protect from light. Throw away any unused medicine after the expiration date. NOTE: This sheet is a summary. It may not cover all possible information. If you have questions about this medicine, talk to your doctor, pharmacist, or health care provider.  2018 Elsevier/Gold Standard (2007-04-26 17:12:34)

## 2016-12-14 NOTE — Op Note (Signed)
[facility] Patient Name: Melissa Velazquez Procedure Date: 12/14/2016 11:19 AM MRN: [id number] Date of Birth: July 09, 1950 Attending MD: Hildred Laser , MD CSN: [id number] Age: 66 Admit Type: Outpatient Procedure:                Colonoscopy Indications:              High risk colon cancer surveillance: Personal                            history of colonic polyps, Incidental change in                            bowel habits noted Providers:                Hildred Laser, MD, Otis Peak B. Sharon Seller, RN, Aram Candela Referring MD:             Halford Chessman MD, MD Medicines:                Meperidine 50 mg IV, Midazolam 9 mg IV Complications:            No immediate complications. Estimated Blood Loss:     Estimated blood loss was minimal. Procedure:                Pre-Anesthesia Assessment:                           - Prior to the procedure, a History and Physical                            was performed, and patient medications and                            allergies were reviewed. The patient's tolerance of                            previous anesthesia was also reviewed. The risks                            and benefits of the procedure and the sedation                            options and risks were discussed with the patient.                            All questions were answered, and informed consent                            was obtained. Prior Anticoagulants: The patient                            last took previous NSAID medication 7 days prior to  the procedure. ASA Grade Assessment: II - A patient                            with mild systemic disease. After reviewing the                            risks and benefits, the patient was deemed in                            satisfactory condition to undergo the procedure.                           After obtaining informed consent, the colonoscope                            was  passed under direct vision. Throughout the                            procedure, the patient's blood pressure, pulse, and                            oxygen saturations were monitored continuously. The                            EC-[id number] ([id number]) scope was introduced through                            the anus and advanced to the the cecum, identified                            by appendiceal orifice and ileocecal valve. The                            colonoscopy was technically difficult and complex                            due to poor endoscopic visualization. The patient                            tolerated the procedure well. The quality of the                            bowel preparation was adequate. The ileocecal                            valve, appendiceal orifice, and rectum were                            photographed. Scope In: 11:54:57 AM Scope Out: 12:43:35 PM Scope Withdrawal Time: 0 hours 41 minutes 26 seconds  Total Procedure Duration: 0 hours 48 minutes 38 seconds  Findings:      The perianal and digital rectal examinations were normal.      A 10 to 12 mm polyp was found in  the appendiceal orifice. The polyp was       carpet-like. Polypectomy was attempted, initially using a saline       injection-lift technique [place] and hot snare. Polyp resection was       incomplete with this device. This intervention then required a different       device and polypectomy technique. The polyp was removed with a piecemeal       technique using a hot snare. Polyp resection was incomplete. The       resected tissue was retrieved. Coagulation for destruction of remaining       portion of lesion using argon plasma was successful. The pathology       specimen was placed into Bottle Number 1.      Scattered small and large-mouthed diverticula were found in the entire       colon.      Normal mucosa was found in the sigmoid colon. Biopsies for histology       were taken with a  cold forceps from the sigmoid colon for evaluation of       microscopic colitis. The pathology specimen was placed into Bottle       Number 2.      External hemorrhoids were found during retroflexion. The hemorrhoids       were small. Impression:               - One 10 to 12 mm polyp at the appendiceal orifice,                            removed piecemeal using a hot snare. Incomplete                            resection. Resected tissue retrieved. Treated with                            argon plasma coagulation (APC).                           - Diverticulosis in the entire examined colon.                           - Normal mucosa in the sigmoid colon. Biopsied.                           - External hemorrhoids. Moderate Sedation:      Moderate (conscious) sedation was administered by the endoscopy nurse       and supervised by the endoscopist. The following parameters were       monitored: oxygen saturation, heart rate, blood pressure, CO2       capnography and response to care. Total physician intraservice time was       55 minutes. Recommendation:           - Patient has a contact number available for                            emergencies. The signs and symptoms of potential                            delayed  complications were discussed with the                            patient. Return to normal activities tomorrow.                            Written discharge instructions were provided to the                            patient.                           - High fiber diet today.                           - Continue present medications.                           - No aspirin, ibuprofen, naproxen, or other                            non-steroidal anti-inflammatory drugs for 7 days.                           - Await pathology results.                           - dicyclomine 10 mg by mouth before breakafst and                            lunch daily.                           -  Repeat colonoscopy for surveillance based on                            pathology results. Procedure Code(s):        --- Professional ---                           [phone], Colonoscopy, flexible; with removal of                            tumor(s), polyp(s), or other lesion(s) by snare                            technique                           [id number], 59, Colonoscopy, flexible; with biopsy,                            single or multiple                           99152, Moderate sedation services provided by the  same physician or other qualified health care                            professional performing the diagnostic or                            therapeutic service that the sedation supports,                            requiring the presence of an independent trained                            observer to assist in the monitoring of the                            patient's level of consciousness and physiological                            status; initial 15 minutes of intraservice time,                            patient age 62 years or older                           [id number], Moderate sedation services; each additional                            15 minutes intraservice time                           99153, Moderate sedation services; each additional                            15 minutes intraservice time                           99153, Moderate sedation services; each additional                            15 minutes intraservice time Diagnosis Code(s):        --- Professional ---                           Z86.010, Personal history of colonic polyps                           D12.1, Benign neoplasm of appendix                           K64.4, Residual hemorrhoidal skin tags                           K57.30, Diverticulosis of large intestine without                            perforation or abscess without bleeding  CPT copyright 2016 American Medical  Association. All rights reserved. The codes documented in this report are preliminary and upon coder review may  be revised to meet current compliance requirements. Hildred Laser, MD Hildred Laser, MD 12/14/2016 12:58:23 PM This report has been signed electronically. Number of Addenda: 0

## 2016-12-18 ENCOUNTER — Encounter (HOSPITAL_COMMUNITY): Payer: Self-pay | Admitting: Internal Medicine

## 2016-12-23 DIAGNOSIS — F341 Dysthymic disorder: Secondary | ICD-10-CM | POA: Diagnosis not present

## 2017-01-06 DIAGNOSIS — F341 Dysthymic disorder: Secondary | ICD-10-CM | POA: Diagnosis not present

## 2017-01-20 DIAGNOSIS — F341 Dysthymic disorder: Secondary | ICD-10-CM | POA: Diagnosis not present

## 2017-02-03 DIAGNOSIS — F341 Dysthymic disorder: Secondary | ICD-10-CM | POA: Diagnosis not present

## 2017-03-03 DIAGNOSIS — F341 Dysthymic disorder: Secondary | ICD-10-CM | POA: Diagnosis not present

## 2017-03-18 ENCOUNTER — Other Ambulatory Visit: Payer: Self-pay | Admitting: Urology

## 2017-03-18 ENCOUNTER — Ambulatory Visit (HOSPITAL_COMMUNITY)
Admission: RE | Admit: 2017-03-18 | Discharge: 2017-03-18 | Disposition: A | Discharge status: Home / Self Care | Payer: Medicare Other | Source: Ambulatory Visit | Attending: Urology | Admitting: Urology

## 2017-03-18 DIAGNOSIS — D3002 Benign neoplasm of left kidney: Secondary | ICD-10-CM | POA: Diagnosis not present

## 2017-03-18 DIAGNOSIS — C642 Malignant neoplasm of left kidney, except renal pelvis: Secondary | ICD-10-CM | POA: Diagnosis not present

## 2017-03-18 DIAGNOSIS — K5730 Diverticulosis of large intestine without perforation or abscess without bleeding: Secondary | ICD-10-CM | POA: Diagnosis not present

## 2017-03-18 DIAGNOSIS — J45909 Unspecified asthma, uncomplicated: Secondary | ICD-10-CM | POA: Diagnosis not present

## 2017-03-25 DIAGNOSIS — C642 Malignant neoplasm of left kidney, except renal pelvis: Secondary | ICD-10-CM | POA: Diagnosis not present

## 2017-03-30 ENCOUNTER — Encounter (INDEPENDENT_AMBULATORY_CARE_PROVIDER_SITE_OTHER): Payer: Self-pay | Admitting: Internal Medicine

## 2017-03-30 ENCOUNTER — Ambulatory Visit (INDEPENDENT_AMBULATORY_CARE_PROVIDER_SITE_OTHER): Payer: Medicare Other | Admitting: Internal Medicine

## 2017-03-30 VITALS — BP 132/88 | HR 76 | Temp 98.3°F | Resp 18 | Ht 64.0 in | Wt 224.4 lb

## 2017-03-30 DIAGNOSIS — R197 Diarrhea, unspecified: Secondary | ICD-10-CM | POA: Diagnosis not present

## 2017-03-30 MED ORDER — PSYLLIUM 28 % PO PACK: 1.0000 | PACK | Freq: Every day | ORAL | Status: DC

## 2017-03-30 NOTE — Patient Instructions (Signed)
Take dicyclomine 10 mg by mouth before breakfast and lunch daily. Increase psyllium/Metamucil dose to 3-4 g daily.  Can take it all at the same time. If above measures fail to decrease stool frequency and improve consistency, take dicyclomine 10 mg and Imodium OTC 2 mg every morning and call with progress report in 4-6 weeks.

## 2017-03-30 NOTE — Progress Notes (Signed)
Presenting complaint;  Follow-up for diarrhea.  Database and Subjective:  Patient is 67 year old Caucasian female who is here for scheduled visit.  She has a history of colonic adenomas and she also noted change in her bowel habits over 6 months ago.  She used to have 2-3 bowel movements per day.  Then she started to have 4-5 stools per day.  GI pathogen panel in July 2018 was negative.  She also reported losing some weight.  She underwent colonoscopy on 12/14/2016.  She had flat polyp at appendiceal orifice treated with piecemeal polypectomy and residual polyp was treated with APC.  Biopsy revealed tubular adenoma.  She also had pancolonic diverticulosis and colonic mucosa was normal.  Random biopsies from sigmoid colon revealed no evidence of microscopic or collagenous colitis.  Patient was begun on dicyclomine.  She remains with an average of 4 stools per day.  Most of her stools are mushy or semi-formed.  She denies melena or rectal bleeding or abdominal pain.  She had one accident prior to her colonoscopy but none since then.  She denies nocturnal bowel movement.  She has not lost any more weight.  She has been taking Celebrex for the past couple of months.  Family history is negative for celiac disease. She wonders if radiation therapy that she had previously for endometrial cancer.   Current Medications: Outpatient Encounter Medications as of 03/30/2017  Medication Sig  . albuterol (PROVENTIL HFA;VENTOLIN HFA) 108 (90 Base) MCG/ACT inhaler Inhale 2 puffs into the lungs every 6 (six) hours as needed for wheezing or shortness of breath.  . celecoxib (CELEBREX) 200 MG capsule Take 1 capsule (200 mg total) by mouth daily as needed (for arthritis pain.).  Marland Kitchen cetirizine (ZYRTEC) 10 MG tablet Take 10 mg by mouth daily as needed for allergies.  Marland Kitchen dicyclomine (BENTYL) 10 MG capsule Take 1 capsule (10 mg total) by mouth 2 (two) times daily before a meal.  . fluticasone (FLONASE) 50 MCG/ACT nasal spray  Place 1 spray into both nostrils daily as needed for allergies or rhinitis.  Marland Kitchen levothyroxine (SYNTHROID, LEVOTHROID) 75 MCG tablet Take 75 mcg by mouth daily before breakfast.   . NON FORMULARY CBD oil - 15 -20 drops a day of the 500 mg.  . sertraline (ZOLOFT) 100 MG tablet Take 150 mg by mouth daily with breakfast.   . triamcinolone cream (KENALOG) 0.1 % Apply 1 application topically daily as needed (rash).  . TURMERIC CURCUMIN PO Take by mouth daily.   No facility-administered encounter medications on file as of 03/30/2017.      Objective: Blood pressure 132/88, pulse 76, temperature 98.3 F (36.8 C), temperature source Oral, resp. rate 18, height 5\' 4"  (1.626 m), weight 224 lb 6.4 oz (101.8 kg). Patient is alert and in no acute distress. Conjunctiva is pink. Sclera is nonicteric Oropharyngeal mucosa is normal. No neck masses or thyromegaly noted. Cardiac exam with regular rhythm normal S1 and S2. No murmur or gallop noted. Lungs are clear to auscultation. Abdomen is full but soft and nontender without organomegaly or masses. No LE edema or clubbing noted.    Assessment:  #1.  Chronic diarrhea/increased frequency of defecation of more than 6 months duration.  GI pathogen panel was negative.  Colonoscopy about 3 months ago did not reveal colitis and biopsies were negative for microscopic or collagenous colitis.  Suspect symptoms due to irritable bowel syndrome.  She could also have suffered low-grade small bowel injury but diarrhea does not suggest small bowel disease  such as steatorrhea or weight loss..  #2. History of colonic polyps. Cecal polyp on recent colonoscopy was snared piecemeal and residual ablated with APC. Polypectomy was felt to be complete.   Plan:  Try taking Dicyclomine before breakfast and before lunch. If stool frequency and consistency does not improve she will take imodium 2 md and dicyclomine 10 mg po qam. Call with progress report in 4 to 6 weeks. Office  visit on as needed basis. Next Colonoscopy in nov, 2023.

## 2017-03-31 DIAGNOSIS — F341 Dysthymic disorder: Secondary | ICD-10-CM | POA: Diagnosis not present

## 2017-04-14 DIAGNOSIS — F341 Dysthymic disorder: Secondary | ICD-10-CM | POA: Diagnosis not present

## 2017-04-23 DIAGNOSIS — R7309 Other abnormal glucose: Secondary | ICD-10-CM | POA: Diagnosis not present

## 2017-04-23 DIAGNOSIS — N760 Acute vaginitis: Secondary | ICD-10-CM | POA: Diagnosis not present

## 2017-04-23 DIAGNOSIS — Z6841 Body Mass Index (BMI) 40.0 and over, adult: Secondary | ICD-10-CM | POA: Diagnosis not present

## 2017-05-12 DIAGNOSIS — F341 Dysthymic disorder: Secondary | ICD-10-CM | POA: Diagnosis not present

## 2017-06-09 DIAGNOSIS — F341 Dysthymic disorder: Secondary | ICD-10-CM | POA: Diagnosis not present

## 2017-07-09 DIAGNOSIS — F341 Dysthymic disorder: Secondary | ICD-10-CM | POA: Diagnosis not present

## 2017-07-23 DIAGNOSIS — F341 Dysthymic disorder: Secondary | ICD-10-CM | POA: Diagnosis not present

## 2017-08-06 DIAGNOSIS — F341 Dysthymic disorder: Secondary | ICD-10-CM | POA: Diagnosis not present

## 2017-08-30 ENCOUNTER — Other Ambulatory Visit (INDEPENDENT_AMBULATORY_CARE_PROVIDER_SITE_OTHER): Payer: Self-pay | Admitting: Internal Medicine

## 2017-09-09 DIAGNOSIS — Z0001 Encounter for general adult medical examination with abnormal findings: Secondary | ICD-10-CM | POA: Diagnosis not present

## 2017-09-09 DIAGNOSIS — K589 Irritable bowel syndrome without diarrhea: Secondary | ICD-10-CM | POA: Diagnosis not present

## 2017-09-09 DIAGNOSIS — Z6839 Body mass index (BMI) 39.0-39.9, adult: Secondary | ICD-10-CM | POA: Diagnosis not present

## 2017-09-09 DIAGNOSIS — M25562 Pain in left knee: Secondary | ICD-10-CM | POA: Diagnosis not present

## 2017-09-10 DIAGNOSIS — F341 Dysthymic disorder: Secondary | ICD-10-CM | POA: Diagnosis not present

## 2017-09-29 ENCOUNTER — Other Ambulatory Visit (INDEPENDENT_AMBULATORY_CARE_PROVIDER_SITE_OTHER): Payer: Self-pay | Admitting: Internal Medicine

## 2017-10-08 DIAGNOSIS — F341 Dysthymic disorder: Secondary | ICD-10-CM | POA: Diagnosis not present

## 2017-10-14 DIAGNOSIS — C642 Malignant neoplasm of left kidney, except renal pelvis: Secondary | ICD-10-CM | POA: Diagnosis not present

## 2017-10-14 DIAGNOSIS — K589 Irritable bowel syndrome without diarrhea: Secondary | ICD-10-CM | POA: Diagnosis not present

## 2017-10-14 DIAGNOSIS — Z0001 Encounter for general adult medical examination with abnormal findings: Secondary | ICD-10-CM | POA: Diagnosis not present

## 2017-10-14 DIAGNOSIS — Z1389 Encounter for screening for other disorder: Secondary | ICD-10-CM | POA: Diagnosis not present

## 2017-10-14 DIAGNOSIS — R7309 Other abnormal glucose: Secondary | ICD-10-CM | POA: Diagnosis not present

## 2017-10-14 DIAGNOSIS — R739 Hyperglycemia, unspecified: Secondary | ICD-10-CM | POA: Diagnosis not present

## 2017-10-14 DIAGNOSIS — Z79899 Other long term (current) drug therapy: Secondary | ICD-10-CM | POA: Diagnosis not present

## 2017-10-18 DIAGNOSIS — C642 Malignant neoplasm of left kidney, except renal pelvis: Secondary | ICD-10-CM | POA: Diagnosis not present

## 2017-10-19 ENCOUNTER — Other Ambulatory Visit: Payer: Self-pay | Admitting: Urology

## 2017-10-19 DIAGNOSIS — C642 Malignant neoplasm of left kidney, except renal pelvis: Secondary | ICD-10-CM

## 2017-10-22 DIAGNOSIS — F341 Dysthymic disorder: Secondary | ICD-10-CM | POA: Diagnosis not present

## 2017-11-12 DIAGNOSIS — Z23 Encounter for immunization: Secondary | ICD-10-CM | POA: Diagnosis not present

## 2017-11-19 DIAGNOSIS — F341 Dysthymic disorder: Secondary | ICD-10-CM | POA: Diagnosis not present

## 2017-12-03 DIAGNOSIS — F341 Dysthymic disorder: Secondary | ICD-10-CM | POA: Diagnosis not present

## 2018-01-21 DIAGNOSIS — F341 Dysthymic disorder: Secondary | ICD-10-CM | POA: Diagnosis not present

## 2018-02-04 DIAGNOSIS — F341 Dysthymic disorder: Secondary | ICD-10-CM | POA: Diagnosis not present

## 2018-02-18 DIAGNOSIS — F341 Dysthymic disorder: Secondary | ICD-10-CM | POA: Diagnosis not present

## 2018-03-04 DIAGNOSIS — F341 Dysthymic disorder: Secondary | ICD-10-CM | POA: Diagnosis not present

## 2018-03-18 DIAGNOSIS — F341 Dysthymic disorder: Secondary | ICD-10-CM | POA: Diagnosis not present

## 2018-03-24 DIAGNOSIS — H539 Unspecified visual disturbance: Secondary | ICD-10-CM | POA: Diagnosis not present

## 2018-03-24 DIAGNOSIS — Z6841 Body Mass Index (BMI) 40.0 and over, adult: Secondary | ICD-10-CM | POA: Diagnosis not present

## 2018-03-24 DIAGNOSIS — Z1389 Encounter for screening for other disorder: Secondary | ICD-10-CM | POA: Diagnosis not present

## 2018-03-24 DIAGNOSIS — L308 Other specified dermatitis: Secondary | ICD-10-CM | POA: Diagnosis not present

## 2018-03-24 DIAGNOSIS — I10 Essential (primary) hypertension: Secondary | ICD-10-CM | POA: Diagnosis not present

## 2018-03-24 DIAGNOSIS — L309 Dermatitis, unspecified: Secondary | ICD-10-CM | POA: Diagnosis not present

## 2018-03-24 DIAGNOSIS — E063 Autoimmune thyroiditis: Secondary | ICD-10-CM | POA: Diagnosis not present

## 2018-04-05 ENCOUNTER — Ambulatory Visit (INDEPENDENT_AMBULATORY_CARE_PROVIDER_SITE_OTHER): Payer: Medicare Other | Admitting: Internal Medicine

## 2018-04-05 ENCOUNTER — Encounter (INDEPENDENT_AMBULATORY_CARE_PROVIDER_SITE_OTHER): Payer: Self-pay | Admitting: Internal Medicine

## 2018-04-05 ENCOUNTER — Other Ambulatory Visit: Payer: Self-pay

## 2018-04-05 VITALS — BP 128/86 | HR 71 | Temp 98.1°F | Resp 18 | Ht 64.0 in | Wt 232.9 lb

## 2018-04-05 DIAGNOSIS — H43812 Vitreous degeneration, left eye: Secondary | ICD-10-CM | POA: Diagnosis not present

## 2018-04-05 DIAGNOSIS — K580 Irritable bowel syndrome with diarrhea: Secondary | ICD-10-CM | POA: Diagnosis not present

## 2018-04-05 MED ORDER — RIFAXIMIN 550 MG PO TABS: 550.0000 mg | ORAL_TABLET | Freq: Three times a day (TID) | ORAL | 0 refills | Status: DC

## 2018-04-05 MED ORDER — DICYCLOMINE HCL 10 MG PO CAPS: 10.0000 mg | ORAL_CAPSULE | Freq: Two times a day (BID) | ORAL | 5 refills | Status: DC

## 2018-04-05 NOTE — Progress Notes (Signed)
Presenting complaint;  Follow-up for IBS.  Subjective:  Melissa Velazquez is  68 year old Caucasian female who has history of IBS with diarrhea who is here for scheduled visit.  She was last seen 1 year ago.  She is on dicyclomine 10 mg twice daily.  She states she usually takes 1 dose in the morning. She feels about the same.  She has an average of 4 bowel movements per day.  On [place] stool chart she rests in between 4 5 and 6.  She has bowel movement within few minutes of each meal.  Then she has 1 extra bowel movement.  She has no problem at night.  She is able to sleep for 8 hours without any disturbance.  She has had occasional accident when she is at home.  She also reports flatulence.  Since the diarrhea started she has not been constipated.  Prior to taking dicyclomine she was having 6 stools per day.  She has gained 8 pounds since her last visit.  She feels weight gain is because of knee arthritis and she is not able to walk as much as she did before.  Current Medications: Outpatient Encounter Medications as of 04/05/2018  Medication Sig  . albuterol (PROVENTIL HFA;VENTOLIN HFA) 108 (90 Base) MCG/ACT inhaler Inhale 2 puffs into the lungs every 6 (six) hours as needed for wheezing or shortness of breath.  . celecoxib (CELEBREX) 200 MG capsule Take 1 capsule (200 mg total) by mouth daily as needed (for arthritis pain.).  Marland Kitchen cetirizine (ZYRTEC) 10 MG tablet Take 10 mg by mouth daily as needed for allergies.  Marland Kitchen dicyclomine (BENTYL) 10 MG capsule TAKE ONE CAPSULE BY MOUTH TWICE DAILY BEFORE MEALS  . fluticasone (FLONASE) 50 MCG/ACT nasal spray Place 1 spray into both nostrils daily as needed for allergies or rhinitis.  Marland Kitchen levothyroxine (SYNTHROID, LEVOTHROID) 75 MCG tablet Take 75 mcg by mouth daily before breakfast.   . NON FORMULARY CBD oil - 15 -20 drops a day of the 500 mg.  . Probiotic Product (PROBIOTIC PO) Take by mouth. This is Raw Probiotic -  Takes 1 po daily.  . psyllium (METAMUCIL SMOOTH  TEXTURE) 28 % packet Take 1 packet by mouth at bedtime.  . sertraline (ZOLOFT) 100 MG tablet Take 150 mg by mouth daily with breakfast.   . triamcinolone cream (KENALOG) 0.1 % Apply 1 application topically daily as needed (rash).  . TURMERIC CURCUMIN PO Take 2,000 mg by mouth daily.    No facility-administered encounter medications on file as of 04/05/2018.      Objective: Blood pressure 128/86, pulse 71, temperature 98.1 F (36.7 C), temperature source Oral, resp. rate 18, height 5\' 4"  (1.626 m), weight 232 lb 14.4 oz (105.6 kg). Patient is alert and in no acute distress. Conjunctiva is pink. Sclera is nonicteric Oropharyngeal mucosa is normal. No neck masses or thyromegaly noted. Cardiac exam with regular rhythm normal S1 and S2. No murmur or gallop noted. Lungs are clear to auscultation. Abdomen is full but soft and nontender with organomegaly or masses. No LE edema or clubbing noted.   Assessment:  Irritable bowel syndrome with diarrhea.  Symptoms not well controlled with low-dose antispasmodic.  She may benefit from 2-week course of Xifaxan.  If this therapy does not work we will discuss with Dr. Hilma Favors about getting her off sertraline as it can cause diarrhea.  Plan:  Patient will continue dicyclomine at 10 mg p.o. twice daily or she can take 20 mg every morning as long as  she does not have side effects. We will trial her on Xifaxan 550 mg by mouth 3 times a day for 2 weeks.  Samples provided to the patient. She will keep stool diary while on therapy and call us with progress report when she finishes antibiotic. Office visit in 1 year.

## 2018-04-05 NOTE — Patient Instructions (Signed)
Please keep daily stool diary while you are on Xifaxan and call with progress report when you finish treatment.

## 2018-04-11 ENCOUNTER — Ambulatory Visit (HOSPITAL_COMMUNITY): Payer: Medicare Other

## 2018-04-15 DIAGNOSIS — F341 Dysthymic disorder: Secondary | ICD-10-CM | POA: Diagnosis not present

## 2018-04-18 ENCOUNTER — Telehealth (INDEPENDENT_AMBULATORY_CARE_PROVIDER_SITE_OTHER): Payer: Self-pay | Admitting: *Deleted

## 2018-04-18 NOTE — Telephone Encounter (Signed)
Patient called with a progress report. She has completed the Xifaxan . She continues to have 3-4 bowel movements a day. The only difference she can see is that her stools may have a better consistency. She is taking the Dicyclomine 2 by mouth in the morning.  She has a yeast infection in her mouth and ask if the Xifaxan may have caused this. She is requesting Diflucan to be called in to see if this can help before having to try the Nystatin.  She also questions if she may have a Gluten Intolerance? Food Allergy? Advised the patient that this would be addressed with Dr.Rehman on 04/19/2018. We would call her back with his recommendation.  Terri Setzer,NP - authorized a RX for the Diflucan to be called to the patient's pharmacy.

## 2018-04-27 ENCOUNTER — Other Ambulatory Visit (INDEPENDENT_AMBULATORY_CARE_PROVIDER_SITE_OTHER): Payer: Self-pay | Admitting: *Deleted

## 2018-04-27 DIAGNOSIS — R197 Diarrhea, unspecified: Secondary | ICD-10-CM

## 2018-04-29 DIAGNOSIS — F341 Dysthymic disorder: Secondary | ICD-10-CM | POA: Diagnosis not present

## 2018-05-06 DIAGNOSIS — R197 Diarrhea, unspecified: Secondary | ICD-10-CM | POA: Diagnosis not present

## 2018-05-06 DIAGNOSIS — F341 Dysthymic disorder: Secondary | ICD-10-CM | POA: Diagnosis not present

## 2018-05-09 ENCOUNTER — Ambulatory Visit (HOSPITAL_COMMUNITY)
Admission: RE | Admit: 2018-05-09 | Discharge: 2018-05-09 | Disposition: A | Discharge status: Home / Self Care | Payer: Medicare Other | Source: Ambulatory Visit | Attending: Urology | Admitting: Urology

## 2018-05-10 LAB — GLIA (IGA/G) + TTG IGA
Antigliadin Abs, IgA: 3 units (ref 0–19)
Gliadin IgG: 1 units (ref 0–19)
Transglutaminase IgA: <2 U/mL (ref 0–3)

## 2018-05-13 DIAGNOSIS — F341 Dysthymic disorder: Secondary | ICD-10-CM | POA: Diagnosis not present

## 2018-05-17 ENCOUNTER — Other Ambulatory Visit (INDEPENDENT_AMBULATORY_CARE_PROVIDER_SITE_OTHER): Payer: Self-pay | Admitting: Internal Medicine

## 2018-05-20 DIAGNOSIS — F341 Dysthymic disorder: Secondary | ICD-10-CM | POA: Diagnosis not present

## 2018-05-24 DIAGNOSIS — B373 Candidiasis of vulva and vagina: Secondary | ICD-10-CM | POA: Diagnosis not present

## 2018-05-25 ENCOUNTER — Encounter (INDEPENDENT_AMBULATORY_CARE_PROVIDER_SITE_OTHER): Payer: Self-pay | Admitting: Internal Medicine

## 2018-05-25 ENCOUNTER — Ambulatory Visit (INDEPENDENT_AMBULATORY_CARE_PROVIDER_SITE_OTHER): Payer: Medicare Other | Admitting: Internal Medicine

## 2018-05-25 ENCOUNTER — Telehealth (INDEPENDENT_AMBULATORY_CARE_PROVIDER_SITE_OTHER): Payer: Self-pay | Admitting: Internal Medicine

## 2018-05-25 ENCOUNTER — Other Ambulatory Visit: Payer: Self-pay

## 2018-05-25 DIAGNOSIS — K588 Other irritable bowel syndrome: Secondary | ICD-10-CM | POA: Diagnosis not present

## 2018-05-25 MED ORDER — DICYCLOMINE HCL 20 MG PO TABS: 20.0000 mg | ORAL_TABLET | Freq: Two times a day (BID) | ORAL | 3 refills | Status: DC

## 2018-05-25 NOTE — Patient Instructions (Signed)
Rs for Dicyclomine 20mg  BID. OV in 6 months.

## 2018-05-25 NOTE — Progress Notes (Signed)
Subjective:    Patient ID: Melissa Velazquez, female    DOB: 04/28/1950, 68 y.o.   MRN: [id number]  HPI Start Time 1:38pm. Ended 150pm. Total time 12 minutes. Patient consents to this telephone OV. She is at home. I am in the office. Telephone OV due to risk of COVID-19.  Unable to do video OV. Reason for visit :IBS diarrhea. Hx of same since undergoing a nephrecrtomy in 2018. She called our office Friday and requested Rx for Diflucan but we were not in. Dr. Hilma Favors however did fill her Rx. She says she had a yeast infection  She tells me she is better. She is having 3-4 mushy stools a day. She is taking Dicyclomine 10 mg (2 tabs in the morning). Her appetite is good. Her stated weight is 230 today. Last weight in March was 234. She had been given a trial of Xifaxan but she says it really did not help. Her appetite has remained good.     Review of Systems Past Medical History:  Diagnosis Date  . Allergic rhinitis   . Anal fissure    Hx of   . Anxiety    hx of  . Arthritis   . Asthma    none in last year   . Depression    hx of  . Diabetes mellitus without complication (HCC)    no meds  . Diverticular disease   . Dysrhythmia    palpitations  . Endometrial cancer ([place]) 2001   spread to appendix   . Hemorrhoids   . Hyperglycemia   . Hypertension   . Hypothyroidism   . Obesity   . Sebaceous cyst   . Thyroid disease   . Thyroid nodule   . 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 APPENDECTOMY  2008  . BIOPSY  12/14/2016   Procedure: BIOPSY;  Surgeon: Rogene Houston, MD;  [facility];  Service: Endoscopy;;  colon  . CHOLECYSTECTOMY  2008  . COLONOSCOPY N/A 11/24/2012   Procedure: COLONOSCOPY;  Surgeon: Rogene Houston, MD;  [facility];  Service: Endoscopy;  Laterality: N/A;  830  . COLONOSCOPY N/A 12/14/2016   Procedure: COLONOSCOPY;  Surgeon: Rogene Houston, MD;  [facility];  Service: Endoscopy;  Laterality: N/A;  12:00  . POLYPECTOMY  12/14/2016   Procedure: POLYPECTOMY;  Surgeon: Rogene Houston, MD;  [facility];  Service: Endoscopy;;  colon  . ROBOTIC ASSITED PARTIAL NEPHRECTOMY Left 08/21/2016   Procedure: XI ROBOTIC ASSITED PARTIAL NEPHRECTOMY;  Surgeon: Ardis Hughs, MD;  Location: [facility];  Service: Urology;  Laterality: Left;  . TOTAL ABDOMINAL HYSTERECTOMY  2001  . WISDOM TOOTH EXTRACTION      Allergies  Allergen Reactions  . Triple Antibiotic [Bacitracin-Neomycin-Polymyxin] Anaphylaxis  . Levofloxacin Other (See Comments)    Stomach upset  . Capsaicin Rash  . Elastic Bandages & [Zinc] Rash    Current Outpatient Medications on File Prior to Visit  Medication Sig Dispense Refill  . albuterol (PROVENTIL HFA;VENTOLIN HFA) 108 (90 Base) MCG/ACT inhaler Inhale 2 puffs into the lungs every 6 (six) hours as needed for wheezing or shortness of breath.    . celecoxib (CELEBREX) 200 MG capsule Take 1 capsule (200 mg total) by mouth daily as needed (for arthritis pain.).    Marland Kitchen cetirizine (ZYRTEC) 10 MG tablet Take 10 mg by mouth daily as needed for allergies.    Marland Kitchen  dicyclomine (BENTYL) 10 MG capsule Take 1 capsule (10 mg total) by mouth 2 (two) times daily before a meal. 60 capsule 5  . fluticasone (FLONASE) 50 MCG/ACT nasal spray Place 1 spray into both nostrils daily as needed for allergies or rhinitis.    Marland Kitchen levothyroxine (SYNTHROID, LEVOTHROID) 75 MCG tablet Take 75 mcg by mouth daily before breakfast.     . NON FORMULARY CBD oil - 15 -20 drops a day of the 500 mg.    . Probiotic Product (PROBIOTIC PO) Take by mouth. This is Raw Probiotic -  Takes 1 po daily.    . psyllium (METAMUCIL SMOOTH TEXTURE) 28 % packet Take 1 packet by mouth at bedtime.    . sertraline (ZOLOFT) 100 MG tablet Take 150 mg by mouth daily with breakfast.     . triamcinolone cream (KENALOG) 0.1 % Apply 1 application topically daily as needed (rash).    .  TURMERIC CURCUMIN PO Take 2,000 mg by mouth daily.      No current facility-administered medications on file prior to visit.         Objective:   Physical Exam Deferred.        Assessment & Plan:  IBS diarrhea.    She is doing somewhat better. I am going to increase the Dicyclomine to 20 mg BID and see how she does.  She will have an OV in 6 months.

## 2018-05-25 NOTE — Telephone Encounter (Signed)
err

## 2018-05-27 DIAGNOSIS — F341 Dysthymic disorder: Secondary | ICD-10-CM | POA: Diagnosis not present

## 2018-06-03 DIAGNOSIS — F341 Dysthymic disorder: Secondary | ICD-10-CM | POA: Diagnosis not present

## 2018-06-10 DIAGNOSIS — F341 Dysthymic disorder: Secondary | ICD-10-CM | POA: Diagnosis not present

## 2018-06-17 DIAGNOSIS — F341 Dysthymic disorder: Secondary | ICD-10-CM | POA: Diagnosis not present

## 2018-06-24 DIAGNOSIS — F341 Dysthymic disorder: Secondary | ICD-10-CM | POA: Diagnosis not present

## 2018-07-01 DIAGNOSIS — F341 Dysthymic disorder: Secondary | ICD-10-CM | POA: Diagnosis not present

## 2018-07-08 DIAGNOSIS — F341 Dysthymic disorder: Secondary | ICD-10-CM | POA: Diagnosis not present

## 2018-07-14 DIAGNOSIS — M25552 Pain in left hip: Secondary | ICD-10-CM | POA: Diagnosis not present

## 2018-07-14 DIAGNOSIS — M170 Bilateral primary osteoarthritis of knee: Secondary | ICD-10-CM | POA: Diagnosis not present

## 2018-07-15 DIAGNOSIS — F341 Dysthymic disorder: Secondary | ICD-10-CM | POA: Diagnosis not present

## 2018-07-15 DIAGNOSIS — Z124 Encounter for screening for malignant neoplasm of cervix: Secondary | ICD-10-CM | POA: Diagnosis not present

## 2018-07-15 DIAGNOSIS — Z1231 Encounter for screening mammogram for malignant neoplasm of breast: Secondary | ICD-10-CM | POA: Diagnosis not present

## 2018-07-15 DIAGNOSIS — Z01419 Encounter for gynecological examination (general) (routine) without abnormal findings: Secondary | ICD-10-CM | POA: Diagnosis not present

## 2018-07-22 DIAGNOSIS — F341 Dysthymic disorder: Secondary | ICD-10-CM | POA: Diagnosis not present

## 2018-07-29 DIAGNOSIS — F341 Dysthymic disorder: Secondary | ICD-10-CM | POA: Diagnosis not present

## 2018-08-05 DIAGNOSIS — F341 Dysthymic disorder: Secondary | ICD-10-CM | POA: Diagnosis not present

## 2018-08-12 DIAGNOSIS — F341 Dysthymic disorder: Secondary | ICD-10-CM | POA: Diagnosis not present

## 2018-08-19 DIAGNOSIS — F341 Dysthymic disorder: Secondary | ICD-10-CM | POA: Diagnosis not present

## 2018-08-26 DIAGNOSIS — M170 Bilateral primary osteoarthritis of knee: Secondary | ICD-10-CM | POA: Diagnosis not present

## 2018-08-26 DIAGNOSIS — L308 Other specified dermatitis: Secondary | ICD-10-CM | POA: Diagnosis not present

## 2018-08-26 DIAGNOSIS — R7309 Other abnormal glucose: Secondary | ICD-10-CM | POA: Diagnosis not present

## 2018-08-26 DIAGNOSIS — M25562 Pain in left knee: Secondary | ICD-10-CM | POA: Diagnosis not present

## 2018-08-26 DIAGNOSIS — Z6841 Body Mass Index (BMI) 40.0 and over, adult: Secondary | ICD-10-CM | POA: Diagnosis not present

## 2018-08-26 DIAGNOSIS — B372 Candidiasis of skin and nail: Secondary | ICD-10-CM | POA: Diagnosis not present

## 2018-08-26 DIAGNOSIS — F341 Dysthymic disorder: Secondary | ICD-10-CM | POA: Diagnosis not present

## 2018-08-26 DIAGNOSIS — M25561 Pain in right knee: Secondary | ICD-10-CM | POA: Diagnosis not present

## 2018-08-26 DIAGNOSIS — I10 Essential (primary) hypertension: Secondary | ICD-10-CM | POA: Diagnosis not present

## 2018-09-02 DIAGNOSIS — M170 Bilateral primary osteoarthritis of knee: Secondary | ICD-10-CM | POA: Diagnosis not present

## 2018-09-02 DIAGNOSIS — F341 Dysthymic disorder: Secondary | ICD-10-CM | POA: Diagnosis not present

## 2018-09-09 DIAGNOSIS — M25561 Pain in right knee: Secondary | ICD-10-CM | POA: Diagnosis not present

## 2018-09-09 DIAGNOSIS — F341 Dysthymic disorder: Secondary | ICD-10-CM | POA: Diagnosis not present

## 2018-09-09 DIAGNOSIS — M25562 Pain in left knee: Secondary | ICD-10-CM | POA: Diagnosis not present

## 2018-09-09 DIAGNOSIS — M170 Bilateral primary osteoarthritis of knee: Secondary | ICD-10-CM | POA: Diagnosis not present

## 2018-09-16 DIAGNOSIS — F341 Dysthymic disorder: Secondary | ICD-10-CM | POA: Diagnosis not present

## 2018-09-23 DIAGNOSIS — F341 Dysthymic disorder: Secondary | ICD-10-CM | POA: Diagnosis not present

## 2018-10-03 DIAGNOSIS — F341 Dysthymic disorder: Secondary | ICD-10-CM | POA: Diagnosis not present

## 2018-10-07 DIAGNOSIS — F341 Dysthymic disorder: Secondary | ICD-10-CM | POA: Diagnosis not present

## 2018-10-14 DIAGNOSIS — F341 Dysthymic disorder: Secondary | ICD-10-CM | POA: Diagnosis not present

## 2018-10-21 DIAGNOSIS — F341 Dysthymic disorder: Secondary | ICD-10-CM | POA: Diagnosis not present

## 2018-10-25 ENCOUNTER — Ambulatory Visit (HOSPITAL_COMMUNITY)
Admission: RE | Admit: 2018-10-25 | Discharge: 2018-10-25 | Disposition: A | Discharge status: Home / Self Care | Payer: Medicare Other | Source: Ambulatory Visit | Attending: Urology | Admitting: Urology

## 2018-10-25 ENCOUNTER — Other Ambulatory Visit: Payer: Self-pay

## 2018-10-25 ENCOUNTER — Other Ambulatory Visit (HOSPITAL_COMMUNITY): Payer: Self-pay | Admitting: Urology

## 2018-10-25 ENCOUNTER — Other Ambulatory Visit: Payer: Self-pay | Admitting: Urology

## 2018-10-25 DIAGNOSIS — R531 Weakness: Secondary | ICD-10-CM | POA: Diagnosis not present

## 2018-10-25 DIAGNOSIS — C642 Malignant neoplasm of left kidney, except renal pelvis: Secondary | ICD-10-CM

## 2018-10-25 LAB — POCT I-STAT CREATININE: Creatinine, Ser: 1 mg/dL (ref 0.44–1.00)

## 2018-10-25 MED ORDER — IOHEXOL 300 MG/ML  SOLN
100.0000 mL | Freq: Once | INTRAMUSCULAR | Status: AC | PRN
  Administered 2018-10-25: 100 mL via INTRAVENOUS

## 2018-10-28 DIAGNOSIS — F341 Dysthymic disorder: Secondary | ICD-10-CM | POA: Diagnosis not present

## 2018-10-28 DIAGNOSIS — Z23 Encounter for immunization: Secondary | ICD-10-CM | POA: Diagnosis not present

## 2018-11-04 DIAGNOSIS — C642 Malignant neoplasm of left kidney, except renal pelvis: Secondary | ICD-10-CM | POA: Diagnosis not present

## 2018-11-04 DIAGNOSIS — F341 Dysthymic disorder: Secondary | ICD-10-CM | POA: Diagnosis not present

## 2018-11-09 DIAGNOSIS — R002 Palpitations: Secondary | ICD-10-CM | POA: Diagnosis not present

## 2018-11-09 DIAGNOSIS — Z6841 Body Mass Index (BMI) 40.0 and over, adult: Secondary | ICD-10-CM | POA: Diagnosis not present

## 2018-11-09 DIAGNOSIS — R7309 Other abnormal glucose: Secondary | ICD-10-CM | POA: Diagnosis not present

## 2018-11-09 DIAGNOSIS — M1991 Primary osteoarthritis, unspecified site: Secondary | ICD-10-CM | POA: Diagnosis not present

## 2018-11-09 DIAGNOSIS — J4521 Mild intermittent asthma with (acute) exacerbation: Secondary | ICD-10-CM | POA: Diagnosis not present

## 2018-11-09 DIAGNOSIS — Z1389 Encounter for screening for other disorder: Secondary | ICD-10-CM | POA: Diagnosis not present

## 2018-11-09 DIAGNOSIS — Z0001 Encounter for general adult medical examination with abnormal findings: Secondary | ICD-10-CM | POA: Diagnosis not present

## 2018-11-09 DIAGNOSIS — E039 Hypothyroidism, unspecified: Secondary | ICD-10-CM | POA: Diagnosis not present

## 2018-11-11 DIAGNOSIS — F341 Dysthymic disorder: Secondary | ICD-10-CM | POA: Diagnosis not present

## 2018-11-18 DIAGNOSIS — F341 Dysthymic disorder: Secondary | ICD-10-CM | POA: Diagnosis not present

## 2018-11-24 ENCOUNTER — Ambulatory Visit (INDEPENDENT_AMBULATORY_CARE_PROVIDER_SITE_OTHER): Payer: Medicare Other | Admitting: Internal Medicine

## 2018-11-25 DIAGNOSIS — F341 Dysthymic disorder: Secondary | ICD-10-CM | POA: Diagnosis not present

## 2018-11-29 ENCOUNTER — Ambulatory Visit (INDEPENDENT_AMBULATORY_CARE_PROVIDER_SITE_OTHER): Payer: Medicare Other | Admitting: Internal Medicine

## 2018-12-02 DIAGNOSIS — F341 Dysthymic disorder: Secondary | ICD-10-CM | POA: Diagnosis not present

## 2018-12-02 DIAGNOSIS — M5416 Radiculopathy, lumbar region: Secondary | ICD-10-CM | POA: Diagnosis not present

## 2018-12-09 DIAGNOSIS — F341 Dysthymic disorder: Secondary | ICD-10-CM | POA: Diagnosis not present

## 2018-12-12 ENCOUNTER — Ambulatory Visit (HOSPITAL_COMMUNITY): Payer: Medicare Other | Attending: Chiropractic Medicine | Admitting: Physical Therapy

## 2018-12-12 ENCOUNTER — Encounter (HOSPITAL_COMMUNITY): Payer: Self-pay | Admitting: Physical Therapy

## 2018-12-12 ENCOUNTER — Other Ambulatory Visit: Payer: Self-pay

## 2018-12-12 DIAGNOSIS — R29898 Other symptoms and signs involving the musculoskeletal system: Secondary | ICD-10-CM | POA: Insufficient documentation

## 2018-12-12 DIAGNOSIS — M545 Low back pain, unspecified: Secondary | ICD-10-CM

## 2018-12-12 DIAGNOSIS — M6281 Muscle weakness (generalized): Secondary | ICD-10-CM | POA: Diagnosis not present

## 2018-12-12 DIAGNOSIS — R2689 Other abnormalities of gait and mobility: Secondary | ICD-10-CM | POA: Diagnosis not present

## 2018-12-12 NOTE — Therapy (Signed)
[place], [place], [address] Phone: [phone]   Fax:  [phone]  Physical Therapy Evaluation  Patient Details  Name: BRITTNANY PACETTI MRN: [id number] Date of Birth: March 02, 1950 Referring Provider (PT): Levy Pupa PA-C   Encounter Date: 12/12/2018  PT End of Session - 12/12/18 1053    Visit Number  1    Number of Visits  12    Date for PT Re-Evaluation  01/23/19   mini-reassess 01/09/19   Authorization Type  Primary: Medicare Part A and B, Secondary: [employer] U[employer] MCR supp; (visit medicare guidlines, no auth)    Authorization Time Period  12/12/18-01/23/2019 (progress note at 10th visit)    Authorization - Visit Number  1    Authorization - Number of Visits  10    PT Start Time  0902    PT Stop Time  0947    PT Time Calculation (min)  45 min    Activity Tolerance  Patient tolerated treatment well;Patient limited by fatigue    Behavior During Therapy  [facility] for tasks assessed/performed       Past Medical History:  Diagnosis Date  . Allergic rhinitis   . Anal fissure    Hx of   . Anxiety    hx of  . Arthritis   . Asthma    none in last year   . Depression    hx of  . Diabetes mellitus without complication (HCC)    no meds  . Diverticular disease   . Dysrhythmia    palpitations  . Endometrial cancer ([place]) 2001   spread to appendix   . Hemorrhoids   . Hyperglycemia   . Hypertension   . Hypothyroidism   . Obesity   . Sebaceous cyst   . Thyroid disease   . Thyroid nodule   . 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 APPENDECTOMY  2008  . BIOPSY  12/14/2016   Procedure: BIOPSY;  Surgeon: Rogene Houston, MD;  [facility];  Service: Endoscopy;;  colon  . CHOLECYSTECTOMY  2008  . COLONOSCOPY N/A 11/24/2012   Procedure: COLONOSCOPY;  Surgeon: Rogene Houston, MD;  [facility];  Service: Endoscopy;  Laterality: N/A;   830  . COLONOSCOPY N/A 12/14/2016   Procedure: COLONOSCOPY;  Surgeon: Rogene Houston, MD;  [facility];  Service: Endoscopy;  Laterality: N/A;  12:00  . POLYPECTOMY  12/14/2016   Procedure: POLYPECTOMY;  Surgeon: Rogene Houston, MD;  [facility];  Service: Endoscopy;;  colon  . ROBOTIC ASSITED PARTIAL NEPHRECTOMY Left 08/21/2016   Procedure: XI ROBOTIC ASSITED PARTIAL NEPHRECTOMY;  Surgeon: Ardis Hughs, MD;  [facility];  Service: Urology;  Laterality: Left;  . TOTAL ABDOMINAL HYSTERECTOMY  2001  . WISDOM TOOTH EXTRACTION      There were no vitals filed for this visit.   Subjective Assessment - 12/12/18 0902    Subjective  Patient is a 68 y.o. female who presents to physical therapy with c/o lumbar radiculopathy. She states she has pain shooting down from her low back down to her mid calf in her L leg. She took prednisone last week and that really helped her symptoms. Her pain started about 2-3 months ago with gradual onset and gradual worsening. Symptoms worsen with walking, getting out of car, increased with motion, transitioning to sitting, and sleeping on left side.  Symptoms ease with sitting, medication, and rest. Patient's main goal reduce pain and walk dog without pain. She has some numbness in L leg intermittently. Patient denies any changes in bowel and bladder function.    Pertinent History  anxiety, obesity, sedendary lifestyle    Limitations  Lifting;Standing;Walking;House hold activities    How long can you sit comfortably?  unrestricted    How long can you stand comfortably?  3 minutes    How long can you walk comfortably?  10 minutes    Patient Stated Goals  decrease pain and be able to walk dog.    Currently in Pain?  No/denies   5/10 at worst        [facility] PT Assessment - 12/12/18 0001      Assessment   Medical Diagnosis  Lumbar Radiculopathy    Referring Provider (PT)  Levy Pupa PA-C    Onset Date/Surgical Date  09/11/18     Next MD Visit  Not at this time    Prior Therapy  None      Precautions   Precautions  None      Restrictions   Weight Bearing Restrictions  No      Balance Screen   Has the patient fallen in the past 6 months  Yes    How many times?  1    Has the patient had a decrease in activity level because of a fear of falling?   Yes    Is the patient reluctant to leave their home because of a fear of falling?   Yes   corona virus     Prior Function   Level of Independence  Independent      Cognition   Overall Cognitive Status  Within Functional Limits for tasks assessed      Observation/Other Assessments   Focus on Therapeutic Outcomes (FOTO)   48% limited      ROM / Strength   AROM / PROM / Strength  AROM;PROM;Strength      AROM   Overall AROM Comments  LE WFL for tasks performed, pressure and discomfort with Lumbar motion in all planes    AROM Assessment Site  Lumbar;Hip;Knee;Ankle    Right/Left Hip  Right;Left    Right/Left Knee  Right;Left    Right/Left Ankle  Right;Left    Lumbar Flexion  0 % limited    Lumbar Extension  50% limited    Lumbar - Right Side Bend  25% limited "pain" in glute when returning to upright    Lumbar - Left Side Bend  25% limited "pressure" in low back    Lumbar - Right Rotation  0 % limited    Lumbar - Left Rotation  0% limited      Strength   Strength Assessment Site  Hip;Knee;Ankle;Lumbar    Right/Left Hip  Right;Left    Right Hip Flexion  4-/5    Right Hip ABduction  4/5    Left Hip Flexion  4-/5    Left Hip ABduction  4/5    Right/Left Knee  Right;Left    Right Knee Flexion  5/5    Right Knee Extension  5/5    Left Knee Flexion  4/5    Left Knee Extension  5/5    Right/Left Ankle  Right;Left    Right Ankle Dorsiflexion  5/5    Left Ankle Dorsiflexion  5/5      Palpation   Palpation comment  Assess next session, unable due to time contstraints  Special Tests    Special Tests  Lumbar    Lumbar Tests  Slump Test      Slump test    Findings  Negative    Side  Left    Comment  negative bilaterally      Transfers   Five time sit to stand comments   11.39 seconds      Ambulation/Gait   Ambulation/Gait  Yes    Ambulation/Gait Assistance  7: Independent    Ambulation Distance (Feet)  400 Feet    Gait Pattern  Decreased step length - left;Decreased step length - right;Decreased stride length;Trendelenburg;Decreased trunk rotation;Lateral trunk lean to right;Lateral trunk lean to left    Ambulation Surface  Level;Indoor    Gait velocity  1.0 m/s    Gait Comments  2 MWT                Objective measurements completed on examination: See above findings.              PT Education - 12/12/18 1052    Education Details  Patient educated on exam findings, POC    Person(s) Educated  Patient    Methods  Explanation    Comprehension  Verbalized understanding       PT Short Term Goals - 12/12/18 1254      PT SHORT TERM GOAL #1   Title  Patient will be independent with HEP in order to optimze functional outcomes    Time  3    Period  Weeks    Status  New    Target Date  01/02/19      PT SHORT TERM GOAL #2   Title  Patient will report 25% improvment of overall symptoms in order to improve tolerance to activity.    Time  3    Period  Weeks    Status  New    Target Date  01/02/19        PT Long Term Goals - 12/12/18 1256      PT LONG TERM GOAL #1   Title  Patient with report 50% reduction in symptoms for improved quality of life.    Time  6    Period  Weeks    Status  New    Target Date  01/23/19      PT LONG TERM GOAL #2   Title  Patient will report ability to walk for 20 minutes in order to walk her dog.    Time  6    Period  Weeks    Status  New    Target Date  01/23/19      PT LONG TERM GOAL #3   Title  Patient will have 10% in [place] in order to demonstrate improved tolerance to functional activity.    Baseline  48% limited    Time  6    Period  Weeks    Status  New     Target Date  01/23/19      PT LONG TERM GOAL #4   Title  Patient will increase gait speed to at least 1.2 m/s in order to be able to cross the street safely.    Baseline  1 m/s    Time  6    Period  Weeks    Status  New    Target Date  01/23/19             Plan - 12/12/18 1101    Clinical Impression Statement  Patient is a 68 y.o. female who presents to physical therapy with c/o lumbar radiculopathy.  She presents with pain limited deficits in lumbar strength, ROM, endurance, postural impairments, and functional mobility with ADL. She is having to modify and restrict ADL as indicated by FOTO score as well as subjective information and objective measures which is affecting overall participation. Current symptoms have caused patient to have decrease in physical activity and decreased participation in walking her dog. Patient will benefit from skilled physical therapy in order to improve function and reduce impairment.    Personal Factors and Comorbidities  Age;Behavior Pattern;Comorbidity 3+;Fitness    Comorbidities  anxiety, obesity, sedentary lifestyle    Examination-Activity Limitations  Bathing;Bed Mobility;Carry;Lift;Locomotion Level;Stairs;Stand;Transfers    Examination-Participation Restrictions  Church;Driving;Volunteer;Shop;Yard Work    [employer]  Evolving/Moderate complexity    Clinical Decision Making  Moderate    Rehab Potential  Good    PT Frequency  2x / week    PT Duration  6 weeks    PT Treatment/Interventions  ADLs/Self Care Home Management;Aquatic Therapy;Biofeedback;Cryotherapy;Electrical Stimulation;Iontophoresis 4mg /ml Dexamethasone;Moist Heat;Traction;Ultrasound;DME Instruction;Gait training;Stair training;Functional mobility training;Therapeutic activities;Therapeutic exercise;Balance training;Neuromuscular re-education;Patient/family education;Orthotic Fit/Training;Manual techniques;Compression bandaging;Passive range of motion;Dry  needling;Energy conservation;Splinting;Taping;Spinal Manipulations;Joint Manipulations    PT Next Visit Plan  assess lumbar spine segmental mobility and tissue tone, initiate HEP, begin core/hip strengthening    PT Home Exercise Plan  initiate next session    Consulted and Agree with Plan of Care  Patient       Patient will benefit from skilled therapeutic intervention in order to improve the following deficits and impairments:  Abnormal gait, Cardiopulmonary status limiting activity, Decreased activity tolerance, Decreased balance, Decreased endurance, Decreased mobility, Decreased range of motion, Decreased strength, Difficulty walking, Impaired flexibility, Improper body mechanics, Postural dysfunction, Pain, Obesity  Visit Diagnosis: Low back pain, unspecified back pain laterality, unspecified chronicity, unspecified whether sciatica present  Muscle weakness (generalized)  Other abnormalities of gait and mobility  Other symptoms and signs involving the musculoskeletal system     Problem List Patient Active Problem List   Diagnosis Date Noted  . History of colonic polyps 11/25/2016  . Renal mass 08/21/2016  . Upper airway cough syndrome 07/06/2014  . Essential hypertension 07/06/2014  . Personal history of colonic polyps 09/17/2010  . BACK PAIN 09/14/2008  . HIRSUTISM 06/15/2008  . GLUCOSE INTOLERANCE 01/09/2008  . NEOPLASM, MALIGNANT, UTERUS 12/27/2006  . THYROID NODULE 12/27/2006  . DIVERTICULAR DISEASE 06/03/2006  . OBESITY NOS 03/09/2006  . DEPRESSION 03/09/2006  . ALLERGIC RHINITIS 03/09/2006  . ASTHMA 03/09/2006  . GERD 03/09/2006  . ELEVATED BLOOD PRESSURE WITHOUT DIAGNOSIS OF HYPERTENSION 03/09/2006    1:16 PM, 12/12/18 Mearl Latin PT, DPT Physical Therapist at [place], [place], [address] Phone: [phone]   Fax:  [phone]  Name: VINAYA FODERA MRN: [id number] Date of Birth: 1950-07-07

## 2018-12-14 ENCOUNTER — Encounter (HOSPITAL_COMMUNITY): Payer: Self-pay | Admitting: Physical Therapy

## 2018-12-14 ENCOUNTER — Ambulatory Visit (HOSPITAL_COMMUNITY): Payer: Medicare Other | Admitting: Physical Therapy

## 2018-12-14 ENCOUNTER — Other Ambulatory Visit: Payer: Self-pay

## 2018-12-14 DIAGNOSIS — M6281 Muscle weakness (generalized): Secondary | ICD-10-CM

## 2018-12-14 DIAGNOSIS — M545 Low back pain, unspecified: Secondary | ICD-10-CM

## 2018-12-14 DIAGNOSIS — R29898 Other symptoms and signs involving the musculoskeletal system: Secondary | ICD-10-CM | POA: Diagnosis not present

## 2018-12-14 DIAGNOSIS — R2689 Other abnormalities of gait and mobility: Secondary | ICD-10-CM | POA: Diagnosis not present

## 2018-12-14 NOTE — Patient Instructions (Signed)
Access Code: [email or web address]  URL: [email or web address]  Date: 12/14/2018  Prepared by: Mitzi Hansen Turon Kilmer   Exercises Supine Bridge - 5 reps - 2 sets - 2 second hold hold - 1x daily - 7x weekly Prone Press Up - 8 reps - 2 sets - 2 second holds hold - 2x daily - 7x weekly

## 2018-12-14 NOTE — Therapy (Addendum)
[place], [place], [address] Phone: [phone]   Fax:  [phone]  Physical Therapy Treatment  Patient Details  Name: Melissa Velazquez MRN: [id number] Date of Birth: [birth year] Referring Provider (PT): Levy Pupa PA-C   Encounter Date: 12/14/2018  PT End of Session - 12/14/18 1025    Visit Number  2    Number of Visits  12    Date for PT Re-Evaluation  01/23/19   mini-reassess 01/09/19   Authorization Type  Primary: Medicare Part A and B, Secondary: [employer] UHC MCR supp; (visit medicare guidlines, no auth)    Authorization Time Period  12/12/18-01/23/2019 (progress note at 10th visit)    Authorization - Visit Number  2    Authorization - Number of Visits  10    PT Start Time  0901    PT Stop Time  0945    PT Time Calculation (min)  44 min    Activity Tolerance  Patient tolerated treatment well;Patient limited by fatigue    Behavior During Therapy  [facility] for tasks assessed/performed       Past Medical History:  Diagnosis Date  . Allergic rhinitis   . Anal fissure    Hx of   . Anxiety    hx of  . Arthritis   . Asthma    none in last year   . Depression    hx of  . Diabetes mellitus without complication (HCC)    no meds  . Diverticular disease   . Dysrhythmia    palpitations  . Endometrial cancer ([place]) 2001   spread to appendix   . Hemorrhoids   . Hyperglycemia   . Hypertension   . Hypothyroidism   . Obesity   . Sebaceous cyst   . Thyroid disease   . Thyroid nodule   . 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 APPENDECTOMY  2008  . BIOPSY  12/14/2016   Procedure: BIOPSY;  Surgeon: Rogene Houston, MD;  [facility];  Service: Endoscopy;;  colon  . CHOLECYSTECTOMY  2008  . COLONOSCOPY N/A 11/24/2012   Procedure: COLONOSCOPY;  Surgeon: Rogene Houston, MD;  [facility];  Service: Endoscopy;  Laterality: N/A;   830  . COLONOSCOPY N/A 12/14/2016   Procedure: COLONOSCOPY;  Surgeon: Rogene Houston, MD;  [facility];  Service: Endoscopy;  Laterality: N/A;  12:00  . POLYPECTOMY  12/14/2016   Procedure: POLYPECTOMY;  Surgeon: Rogene Houston, MD;  [facility];  Service: Endoscopy;;  colon  . ROBOTIC ASSITED PARTIAL NEPHRECTOMY Left 08/21/2016   Procedure: XI ROBOTIC ASSITED PARTIAL NEPHRECTOMY;  Surgeon: Ardis Hughs, MD;  [facility];  Service: Urology;  Laterality: Left;  . TOTAL ABDOMINAL HYSTERECTOMY  2001  . WISDOM TOOTH EXTRACTION      There were no vitals filed for this visit.  Subjective Assessment - 12/14/18 0903    Subjective  Patient reports that she has been feeling well. She notices discomfort when leaning over to her right side and last night when laying on her back. She feels that the prednisone has really helped.    Pertinent History  anxiety, obesity, sedendary lifestyle    Limitations  Lifting;Standing;Walking;House hold activities    How long can you sit comfortably?  unrestricted    How long can you stand comfortably?  3 minutes    How  long can you walk comfortably?  10 minutes    Patient Stated Goals  decrease pain and be able to walk dog.    Currently in Pain?  No/denies         [facility] PT Assessment - 12/14/18 0001      AROM   Overall AROM Comments  repeated lumbar ext. in standing - pain to begin, decreases with reps; Prone on elbows--> prone press ups - decrease in symptoms      Palpation   Spinal mobility  Hypomobile T10- L5 CPA, R and L UPA, Pain at L UPA L4, L5, PSIS and sacrum    Palpation comment  TTP throughout Lumbar paraspinals, L PSIS, sacrum, L [facility] PT Treatment/Exercise - 12/14/18 0001      Ambulation/Gait   Ambulation/Gait  Yes    Ambulation/Gait Assistance  7: Independent    Ambulation Distance (Feet)  226 Feet    Gait Pattern  Decreased step length - left;Decreased step  length - right;Decreased stride length;Trendelenburg;Decreased trunk rotation;Lateral trunk lean to right;Lateral trunk lean to left    Ambulation Surface  Level;Indoor    Gait Comments  decreased gait velocity but less pain with gait following manual therapy interventions      Exercises   Exercises  Lumbar      Lumbar Exercises: Standing   Other Standing Lumbar Exercises  standing lumbar extension with hands on hips 1x10      Lumbar Exercises: Supine   Bridge Limitations  2x5 with 2 second holds      Lumbar Exercises: Prone   Other Prone Lumbar Exercises  Prone on elbows 5x10 second holds ---> Prone press up 2x8 with 2 second holds      Manual Therapy   Manual Therapy  Joint mobilization;Soft tissue mobilization    Manual therapy comments  completed separately from all other interventions    Joint Mobilization  Grade II-III L UPA L4-L5, Sacrum, L PSIS    Soft tissue mobilization  L Glute, L lumbar paraspinals, Self STM with tennis ball at wall              PT Education - 12/14/18 1024    Education Details  Patient educated on completing HEP and beginning walking program for overall fitness    Person(s) Educated  Patient    Methods  Explanation;Handout    Comprehension  Verbalized understanding       PT Short Term Goals - 12/14/18 1044      PT SHORT TERM GOAL #1   Title  Patient will be independent with HEP in order to optimze functional outcomes    Time  3    Period  Weeks    Status  On-going    Target Date  01/02/19      PT SHORT TERM GOAL #2   Title  Patient will report 25% improvment of overall symptoms in order to improve tolerance to activity.    Time  3    Period  Weeks    Status  On-going    Target Date  01/02/19        PT Long Term Goals - 12/14/18 1044      PT LONG TERM GOAL #1   Title  Patient with report 50% reduction in symptoms for improved quality of life.    Time  6    Period  Weeks  Status  On-going      PT LONG TERM GOAL #2   Title   Patient will report ability to walk for 20 minutes in order to walk her dog.    Time  6    Period  Weeks    Status  On-going      PT LONG TERM GOAL #3   Title  Patient will have 10% in FOTO scrore in order to demonstrate improved tolerance to functional activity.    Baseline  48% limited    Time  6    Period  Weeks    Status  On-going      PT LONG TERM GOAL #4   Title  Patient will increase gait speed to at least 1.2 m/s in order to be able to cross the street safely.    Baseline  1 m/s    Time  6    Period  Weeks    Status  On-going            Plan - 12/14/18 1025    Clinical Impression Statement  Patient is hypomobile throughout lumbar spine and tender to palpation in L lumbar paraspinals, glute and at PSIS. There is a change in tissue resistance following manual therapy and patient experiences reduction in symptoms. She ambulates with slow gait velocity but does not experience increase in symptoms as she usually does and is most limited by impaired cardiovascular endurance/SOB. She initially has pain in lumbar spine with extension which decreased with reps of repeated extension. She tolerates prone positioning better than standing for extension today. She is able to complete bridges with proper mechanics following verbal cueing. Patient will continue to benefit from skilled physical therapy in order to improve function and reduce impairment.    Personal Factors and Comorbidities  Age;Behavior Pattern;Comorbidity 3+;Fitness    Comorbidities  anxiety, obesity, sedentary lifestyle    Examination-Activity Limitations  Bathing;Bed Mobility;Carry;Lift;Locomotion Level;Stairs;Stand;Transfers    Examination-Participation Restrictions  Church;Driving;Volunteer;Shop;Yard Work    [employer] officer  --    Rehab Potential  Good    PT Frequency  2x / week    PT Duration  6 weeks    PT Treatment/Interventions  ADLs/Self Care Home Management;Aquatic  Therapy;Biofeedback;Cryotherapy;Electrical Stimulation;Iontophoresis 4mg /ml Dexamethasone;Moist Heat;Traction;Ultrasound;DME Instruction;Gait training;Stair training;Functional mobility training;Therapeutic activities;Therapeutic exercise;Balance training;Neuromuscular re-education;Patient/family education;Orthotic Fit/Training;Manual techniques;Compression bandaging;Passive range of motion;Dry needling;Energy conservation;Splinting;Taping;Spinal Manipulations;Joint Manipulations    PT Next Visit Plan  assess response to manual therapy and HEP. continue to progress core/hip strengthening    PT Home Exercise Plan  12/14/18: self STM with ball, repeated prone press ups 2x8, bridges 2x5 with 2 second holds    Consulted and Agree with Plan of Care  Patient       Patient will benefit from skilled therapeutic intervention in order to improve the following deficits and impairments:  Abnormal gait, Cardiopulmonary status limiting activity, Decreased activity tolerance, Decreased balance, Decreased endurance, Decreased mobility, Decreased range of motion, Decreased strength, Difficulty walking, Impaired flexibility, Improper body mechanics, Postural dysfunction, Pain, Obesity  Visit Diagnosis: Low back pain, unspecified back pain laterality, unspecified chronicity, unspecified whether sciatica present  Muscle weakness (generalized)  Other abnormalities of gait and mobility  Other symptoms and signs involving the musculoskeletal system     Problem List Patient Active Problem List   Diagnosis Date Noted  . History of colonic polyps 11/25/2016  . Renal mass 08/21/2016  . Upper airway cough syndrome 07/06/2014  . Essential hypertension 07/06/2014  . Personal history of colonic polyps 09/17/2010  .  BACK PAIN 09/14/2008  . HIRSUTISM 06/15/2008  . GLUCOSE INTOLERANCE 01/09/2008  . NEOPLASM, MALIGNANT, UTERUS 12/27/2006  . THYROID NODULE 12/27/2006  . DIVERTICULAR DISEASE 06/03/2006  . OBESITY NOS  03/09/2006  . DEPRESSION 03/09/2006  . ALLERGIC RHINITIS 03/09/2006  . ASTHMA 03/09/2006  . GERD 03/09/2006  . ELEVATED BLOOD PRESSURE WITHOUT DIAGNOSIS OF HYPERTENSION 03/09/2006    10:56 AM, 12/14/18 Mearl Latin PT, DPT Physical Therapist at [place], [place], [address] Phone: [phone]   Fax:  [phone]  Name: Melissa Velazquez MRN: [id number] Date of Birth: March 27, 1950

## 2018-12-16 DIAGNOSIS — F341 Dysthymic disorder: Secondary | ICD-10-CM | POA: Diagnosis not present

## 2018-12-19 ENCOUNTER — Encounter (HOSPITAL_COMMUNITY): Payer: Medicare Other | Admitting: Physical Therapy

## 2018-12-19 ENCOUNTER — Telehealth (HOSPITAL_COMMUNITY): Payer: Self-pay | Admitting: Physical Therapy

## 2018-12-19 NOTE — Telephone Encounter (Signed)
pt called to cx today's appt due to she is sick 

## 2018-12-22 ENCOUNTER — Encounter (HOSPITAL_COMMUNITY): Payer: Self-pay

## 2018-12-22 ENCOUNTER — Ambulatory Visit (HOSPITAL_COMMUNITY): Payer: Medicare Other | Attending: Chiropractic Medicine

## 2018-12-22 ENCOUNTER — Other Ambulatory Visit: Payer: Self-pay

## 2018-12-22 DIAGNOSIS — R29898 Other symptoms and signs involving the musculoskeletal system: Secondary | ICD-10-CM

## 2018-12-22 DIAGNOSIS — M545 Low back pain, unspecified: Secondary | ICD-10-CM

## 2018-12-22 DIAGNOSIS — R2689 Other abnormalities of gait and mobility: Secondary | ICD-10-CM

## 2018-12-22 DIAGNOSIS — M6281 Muscle weakness (generalized): Secondary | ICD-10-CM | POA: Diagnosis not present

## 2018-12-22 NOTE — Therapy (Signed)
[place], [place], [address] Phone: [phone]   Fax:  [phone]  Physical Therapy Treatment  Patient Details  Name: Melissa Velazquez MRN: [id number] Date of Birth: 1950-05-30 Referring Provider (PT): Levy Pupa PA-C   Encounter Date: 12/22/2018  PT End of Session - 12/22/18 0930    Visit Number  3    Number of Visits  12    Date for PT Re-Evaluation  01/23/19   minireassess 01/09/19   Authorization Type  Primary: Medicare Part A and B, Secondary: [employer] UHC MCR supp; (visit medicare guidlines, no auth)    Authorization Time Period  12/12/18-01/23/2019 (progress note at 10th visit)    Authorization - Visit Number  3    Authorization - Number of Visits  10    PT Start Time  0918    PT Stop Time  0956    PT Time Calculation (min)  38 min    Activity Tolerance  Patient tolerated treatment well;Patient limited by fatigue    Behavior During Therapy  [facility] for tasks assessed/performed       Past Medical History:  Diagnosis Date  . Allergic rhinitis   . Anal fissure    Hx of   . Anxiety    hx of  . Arthritis   . Asthma    none in last year   . Depression    hx of  . Diabetes mellitus without complication (HCC)    no meds  . Diverticular disease   . Dysrhythmia    palpitations  . Endometrial cancer ([place]) 2001   spread to appendix   . Hemorrhoids   . Hyperglycemia   . Hypertension   . Hypothyroidism   . Obesity   . Sebaceous cyst   . Thyroid disease   . Thyroid nodule   . 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 APPENDECTOMY  2008  . BIOPSY  12/14/2016   Procedure: BIOPSY;  Surgeon: Rogene Houston, MD;  [facility];  Service: Endoscopy;;  colon  . CHOLECYSTECTOMY  2008  . COLONOSCOPY N/A 11/24/2012   Procedure: COLONOSCOPY;  Surgeon: Rogene Houston, MD;  [facility];  Service: Endoscopy;  Laterality: N/A;  830   . COLONOSCOPY N/A 12/14/2016   Procedure: COLONOSCOPY;  Surgeon: Rogene Houston, MD;  [facility];  Service: Endoscopy;  Laterality: N/A;  12:00  . POLYPECTOMY  12/14/2016   Procedure: POLYPECTOMY;  Surgeon: Rogene Houston, MD;  [facility];  Service: Endoscopy;;  colon  . ROBOTIC ASSITED PARTIAL NEPHRECTOMY Left 08/21/2016   Procedure: XI ROBOTIC ASSITED PARTIAL NEPHRECTOMY;  Surgeon: Ardis Hughs, MD;  [facility];  Service: Urology;  Laterality: Left;  . TOTAL ABDOMINAL HYSTERECTOMY  2001  . WISDOM TOOTH EXTRACTION      There were no vitals filed for this visit.  Subjective Assessment - 12/22/18 0925    Subjective  Pt reports improved ability to walk and decreased pain following massage last session.  Reports improved ability to walk dog.  No reports of LBP, does c/o Bil knee pain today Lt>Rt.    Pertinent History  anxiety, obesity, sedendary lifestyle    Patient Stated Goals  decrease pain and be able to walk dog.    Currently in Pain?  No/denies  [place] Adult PT Treatment/Exercise - 12/22/18 0001      Exercises   Exercises  Lumbar      Lumbar Exercises: Stretches   Standing Extension  2 reps;10 reps;5 seconds    Prone on Elbows Stretch  2 reps;60 seconds    Press Ups  10 reps;10 seconds    Press Ups Limitations  Reports decreased radicular symptoms with reps      Lumbar Exercises: Standing   Other Standing Lumbar Exercises  SLS Lt 3", Rt 10"      Lumbar Exercises: Supine   Bridge Limitations  2x5 with 2 second holds      Manual Therapy   Manual Therapy  Soft tissue mobilization    Manual therapy comments  completed separately from all other interventions    Soft tissue mobilization  Lumbar paraspinals; Bil glutes             PT Education - 12/22/18 1113    Education Details  Educated on importance of posture for pain control, lumbar roll in seated position    Person(s) Educated  Patient     Methods  Explanation    Comprehension  Verbalized understanding;Need further instruction       PT Short Term Goals - 12/14/18 1044      PT SHORT TERM GOAL #1   Title  Patient will be independent with HEP in order to optimze functional outcomes    Time  3    Period  Weeks    Status  On-going    Target Date  01/02/19      PT SHORT TERM GOAL #2   Title  Patient will report 25% improvment of overall symptoms in order to improve tolerance to activity.    Time  3    Period  Weeks    Status  On-going    Target Date  01/02/19        PT Long Term Goals - 12/14/18 1044      PT LONG TERM GOAL #1   Title  Patient with report 50% reduction in symptoms for improved quality of life.    Time  6    Period  Weeks    Status  On-going      PT LONG TERM GOAL #2   Title  Patient will report ability to walk for 20 minutes in order to walk her dog.    Time  6    Period  Weeks    Status  On-going      PT LONG TERM GOAL #3   Title  Patient will have 10% in FOTO scrore in order to demonstrate improved tolerance to functional activity.    Baseline  48% limited    Time  6    Period  Weeks    Status  On-going      PT LONG TERM GOAL #4   Title  Patient will increase gait speed to at least 1.2 m/s in order to be able to cross the street safely.    Baseline  1 m/s    Time  6    Period  Weeks    Status  On-going            Plan - 12/22/18 1106    Clinical Impression Statement  Positive reports following extension based exercises and manual last session.  Continued with extension based exercises this session.  Began SLS for gluteal activation and education on importance of posture for pain control. Added lumbar roll in  seated position to assist with posture.  EOS with manual to address soft tissue restriciotns in gluteal musculature with reoprts of relief folloiwng and decreased overall tightness.    Personal Factors and Comorbidities  Age;Behavior Pattern;Comorbidity 3+;Fitness     Comorbidities  anxiety, obesity, sedentary lifestyle    Examination-Activity Limitations  Bathing;Bed Mobility;Carry;Lift;Locomotion Level;Stairs;Stand;Transfers    Examination-Participation Restrictions  Church;Driving;Volunteer;Shop;Yard Work    [employer] officer  Evolving/Moderate complexity    Clinical Decision Making  Moderate    Rehab Potential  Good    PT Frequency  2x / week    PT Duration  6 weeks    PT Treatment/Interventions  ADLs/Self Care Home Management;Aquatic Therapy;Biofeedback;Cryotherapy;Electrical Stimulation;Iontophoresis 4mg /ml Dexamethasone;Moist Heat;Traction;Ultrasound;DME Instruction;Gait training;Stair training;Functional mobility training;Therapeutic activities;Therapeutic exercise;Balance training;Neuromuscular re-education;Patient/family education;Orthotic Fit/Training;Manual techniques;Compression bandaging;Passive range of motion;Dry needling;Energy conservation;Splinting;Taping;Spinal Manipulations;Joint Manipulations    PT Next Visit Plan  Add posture strengthening with theraband next session.  assess response to manual therapy and HEP. continue to progress core/hip strengthening    PT Home Exercise Plan  12/14/18: self STM with ball, repeated prone press ups 2x8, bridges 2x5 with 2 second holds       Patient will benefit from skilled therapeutic intervention in order to improve the following deficits and impairments:  Abnormal gait, Cardiopulmonary status limiting activity, Decreased activity tolerance, Decreased balance, Decreased endurance, Decreased mobility, Decreased range of motion, Decreased strength, Difficulty walking, Impaired flexibility, Improper body mechanics, Postural dysfunction, Pain, Obesity  Visit Diagnosis: Muscle weakness (generalized)  Other symptoms and signs involving the musculoskeletal system  Other abnormalities of gait and mobility  Low back pain, unspecified back pain laterality, unspecified chronicity,  unspecified whether sciatica present     Problem List Patient Active Problem List   Diagnosis Date Noted  . History of colonic polyps 11/25/2016  . Renal mass 08/21/2016  . Upper airway cough syndrome 07/06/2014  . Essential hypertension 07/06/2014  . Personal history of colonic polyps 09/17/2010  . BACK PAIN 09/14/2008  . HIRSUTISM 06/15/2008  . GLUCOSE INTOLERANCE 01/09/2008  . NEOPLASM, MALIGNANT, UTERUS 12/27/2006  . THYROID NODULE 12/27/2006  . DIVERTICULAR DISEASE 06/03/2006  . OBESITY NOS 03/09/2006  . DEPRESSION 03/09/2006  . ALLERGIC RHINITIS 03/09/2006  . ASTHMA 03/09/2006  . GERD 03/09/2006  . ELEVATED BLOOD PRESSURE WITHOUT DIAGNOSIS OF HYPERTENSION 03/09/2006   Ihor Austin, LPTA; CBIS [phone]  Aldona Lento 12/22/2018, [place], [place], [address] Phone: [phone]   Fax:  [phone]  Name: Melissa Velazquez MRN: [id number] Date of Birth: [birth year]

## 2018-12-23 DIAGNOSIS — F341 Dysthymic disorder: Secondary | ICD-10-CM | POA: Diagnosis not present

## 2018-12-26 ENCOUNTER — Encounter (HOSPITAL_COMMUNITY): Payer: Self-pay | Admitting: Physical Therapy

## 2018-12-26 ENCOUNTER — Other Ambulatory Visit: Payer: Self-pay

## 2018-12-26 ENCOUNTER — Ambulatory Visit (HOSPITAL_COMMUNITY): Payer: Medicare Other | Admitting: Physical Therapy

## 2018-12-26 DIAGNOSIS — R2689 Other abnormalities of gait and mobility: Secondary | ICD-10-CM

## 2018-12-26 DIAGNOSIS — M545 Low back pain, unspecified: Secondary | ICD-10-CM

## 2018-12-26 DIAGNOSIS — R29898 Other symptoms and signs involving the musculoskeletal system: Secondary | ICD-10-CM

## 2018-12-26 DIAGNOSIS — M6281 Muscle weakness (generalized): Secondary | ICD-10-CM

## 2018-12-26 NOTE — Therapy (Signed)
[place], [place], [address] Phone: [phone]   Fax:  [phone]  Physical Therapy Treatment  Patient Details  Name: Melissa Velazquez MRN: [id number] Date of Birth: [birth year] Referring Provider (PT): Levy Pupa PA-C   Encounter Date: 12/26/2018  PT End of Session - 12/26/18 0949    Visit Number  4    Number of Visits  12    Date for PT Re-Evaluation  01/23/19   minireassess 01/09/19   Authorization Type  Primary: Medicare Part A and B, Secondary: [employer] U[employer] MCR supp; (visit medicare guidlines, no auth)    Authorization Time Period  12/12/18-01/23/2019 (progress note at 10th visit)    Authorization - Visit Number  4    Authorization - Number of Visits  10    PT Start Time  0906    PT Stop Time  0945    PT Time Calculation (min)  39 min    Activity Tolerance  Patient tolerated treatment well;Patient limited by fatigue    Behavior During Therapy  [facility] for tasks assessed/performed       Past Medical History:  Diagnosis Date  . Allergic rhinitis   . Anal fissure    Hx of   . Anxiety    hx of  . Arthritis   . Asthma    none in last year   . Depression    hx of  . Diabetes mellitus without complication (HCC)    no meds  . Diverticular disease   . Dysrhythmia    palpitations  . Endometrial cancer ([place]) 2001   spread to appendix   . Hemorrhoids   . Hyperglycemia   . Hypertension   . Hypothyroidism   . Obesity   . Sebaceous cyst   . Thyroid disease   . Thyroid nodule   . 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 APPENDECTOMY  2008  . BIOPSY  12/14/2016   Procedure: BIOPSY;  Surgeon: Rogene Houston, MD;  [facility];  Service: Endoscopy;;  colon  . CHOLECYSTECTOMY  2008  . COLONOSCOPY N/A 11/24/2012   Procedure: COLONOSCOPY;  Surgeon: Rogene Houston, MD;  [facility];  Service: Endoscopy;  Laterality: N/A;  830   . COLONOSCOPY N/A 12/14/2016   Procedure: COLONOSCOPY;  Surgeon: Rogene Houston, MD;  [facility];  Service: Endoscopy;  Laterality: N/A;  12:00  . POLYPECTOMY  12/14/2016   Procedure: POLYPECTOMY;  Surgeon: Rogene Houston, MD;  [facility];  Service: Endoscopy;;  colon  . ROBOTIC ASSITED PARTIAL NEPHRECTOMY Left 08/21/2016   Procedure: XI ROBOTIC ASSITED PARTIAL NEPHRECTOMY;  Surgeon: Ardis Hughs, MD;  [facility];  Service: Urology;  Laterality: Left;  . TOTAL ABDOMINAL HYSTERECTOMY  2001  . WISDOM TOOTH EXTRACTION      There were no vitals filed for this visit.  Subjective Assessment - 12/26/18 0908    Subjective  Patient states she has been feeling ok. She still has some pain in her back. She thinks she is doing better and probably needs to do the exercises more. She has not been having as much pain down her leg. She notices more pain with walking for more than 10 minutes. She notices she slouches when she walks.    Pertinent History  anxiety, obesity, sedendary lifestyle    Patient Stated Goals  decrease pain and be  able to walk dog.    Currently in Pain?  Yes    Pain Score  4     Pain Location  Back                       [facility] Adult PT Treatment/Exercise - 12/26/18 0001      Ambulation/Gait   Ambulation/Gait  Yes    Ambulation/Gait Assistance  7: Independent    Ambulation Distance (Feet)  226 Feet    Gait Pattern  Decreased step length - left;Decreased step length - right;Decreased stride length;Trendelenburg;Decreased trunk rotation;Lateral trunk lean to right;Lateral trunk lean to left    Ambulation Surface  Level;Indoor      Lumbar Exercises: Stretches   Standing Extension  2 reps;10 reps;5 seconds    Other Lumbar Stretch Exercise  standing lumbar extensions with elbows on wall 3x10      Lumbar Exercises: Standing   Other Standing Lumbar Exercises  Palof press 1 x 10 each way - pain in LB discontinued for todays  session      Manual Therapy   Manual Therapy  Soft tissue mobilization;Joint mobilization    Manual therapy comments  completed separately from all other interventions    Joint Mobilization  Grade II-III L UPA L4-L5, Sacrum, L PSIS    Soft tissue mobilization  Lumbar paraspinals; Bil glutes             PT Education - 12/26/18 0948    Education Details  Patient educated on completing HEP, frequency of exercises, performing self STM with ball, low back pain, and lumbar disc issues and physical therapy management of symptoms    Person(s) Educated  Patient    Methods  Explanation    Comprehension  Verbalized understanding       PT Short Term Goals - 12/14/18 1044      PT SHORT TERM GOAL #1   Title  Patient will be independent with HEP in order to optimze functional outcomes    Time  3    Period  Weeks    Status  On-going    Target Date  01/02/19      PT SHORT TERM GOAL #2   Title  Patient will report 25% improvment of overall symptoms in order to improve tolerance to activity.    Time  3    Period  Weeks    Status  On-going    Target Date  01/02/19        PT Long Term Goals - 12/14/18 1044      PT LONG TERM GOAL #1   Title  Patient with report 50% reduction in symptoms for improved quality of life.    Time  6    Period  Weeks    Status  On-going      PT LONG TERM GOAL #2   Title  Patient will report ability to walk for 20 minutes in order to walk her dog.    Time  6    Period  Weeks    Status  On-going      PT LONG TERM GOAL #3   Title  Patient will have 10% in FOTO scrore in order to demonstrate improved tolerance to functional activity.    Baseline  48% limited    Time  6    Period  Weeks    Status  On-going      PT LONG TERM GOAL #4   Title  Patient will increase  gait speed to at least 1.2 m/s in order to be able to cross the street safely.    Baseline  1 m/s    Time  6    Period  Weeks    Status  On-going            Plan - 12/26/18 1053     Clinical Impression Statement  Patient has c/o increase in low back symptoms with extension based exercises that decreases to/below baseline after completion of exercise. She has vague description of symptom changes with extension exercises, effectiveness is not clear at this time. She experiences pain with palof press when its pulling her toward right which does not change with cueing for core activation, discontinued for today's session. She experiences decrease in symptoms following manual therapy. She is educated on increasing frequency of HEP, completing HEP, performing STM, and monitoring symptoms for effectiveness of interventions. Patient will continue to benefit from skilled physical therapy in order to return to PLOF.    Personal Factors and Comorbidities  Age;Behavior Pattern;Comorbidity 3+;Fitness    Comorbidities  anxiety, obesity, sedentary lifestyle    Examination-Activity Limitations  Bathing;Bed Mobility;Carry;Lift;Locomotion Level;Stairs;Stand;Transfers    Examination-Participation Restrictions  Church;Driving;Volunteer;Shop;Yard Work    [employer] officer  Evolving/Moderate complexity    Rehab Potential  Good    PT Frequency  2x / week    PT Duration  6 weeks    PT Treatment/Interventions  ADLs/Self Care Home Management;Aquatic Therapy;Biofeedback;Cryotherapy;Electrical Stimulation;Iontophoresis 4mg /ml Dexamethasone;Moist Heat;Traction;Ultrasound;DME Instruction;Gait training;Stair training;Functional mobility training;Therapeutic activities;Therapeutic exercise;Balance training;Neuromuscular re-education;Patient/family education;Orthotic Fit/Training;Manual techniques;Compression bandaging;Passive range of motion;Dry needling;Energy conservation;Splinting;Taping;Spinal Manipulations;Joint Manipulations    PT Next Visit Plan  Add posture strengthening with theraband next session.  assess response to manual therapy and HEP. continue to progress core/hip strengthening;  assess effectivness of extension based exercises    PT Home Exercise Plan  12/14/18: self STM with ball, repeated prone press ups 2x8, bridges 2x5 with 2 second holds 12/26/18: standing extensions at wall and away from wall       Patient will benefit from skilled therapeutic intervention in order to improve the following deficits and impairments:  Abnormal gait, Cardiopulmonary status limiting activity, Decreased activity tolerance, Decreased balance, Decreased endurance, Decreased mobility, Decreased range of motion, Decreased strength, Difficulty walking, Impaired flexibility, Improper body mechanics, Postural dysfunction, Pain, Obesity  Visit Diagnosis: Muscle weakness (generalized)  Other symptoms and signs involving the musculoskeletal system  Other abnormalities of gait and mobility  Low back pain, unspecified back pain laterality, unspecified chronicity, unspecified whether sciatica present     Problem List Patient Active Problem List   Diagnosis Date Noted  . History of colonic polyps 11/25/2016  . Renal mass 08/21/2016  . Upper airway cough syndrome 07/06/2014  . Essential hypertension 07/06/2014  . Personal history of colonic polyps 09/17/2010  . BACK PAIN 09/14/2008  . HIRSUTISM 06/15/2008  . GLUCOSE INTOLERANCE 01/09/2008  . NEOPLASM, MALIGNANT, UTERUS 12/27/2006  . THYROID NODULE 12/27/2006  . DIVERTICULAR DISEASE 06/03/2006  . OBESITY NOS 03/09/2006  . DEPRESSION 03/09/2006  . ALLERGIC RHINITIS 03/09/2006  . ASTHMA 03/09/2006  . GERD 03/09/2006  . ELEVATED BLOOD PRESSURE WITHOUT DIAGNOSIS OF HYPERTENSION 03/09/2006    11:01 AM, 12/26/18 Mearl Latin PT, DPT Physical Therapist at [place], [place], [address] Phone: [phone]   Fax:  [phone]  Name: Melissa Velazquez MRN: [id number] Date of Birth: [birth year]

## 2018-12-29 ENCOUNTER — Ambulatory Visit (HOSPITAL_COMMUNITY): Payer: Medicare Other | Admitting: Physical Therapy

## 2018-12-29 ENCOUNTER — Other Ambulatory Visit: Payer: Self-pay

## 2018-12-29 ENCOUNTER — Encounter (HOSPITAL_COMMUNITY): Payer: Self-pay | Admitting: Physical Therapy

## 2018-12-29 DIAGNOSIS — M6281 Muscle weakness (generalized): Secondary | ICD-10-CM

## 2018-12-29 DIAGNOSIS — M545 Low back pain, unspecified: Secondary | ICD-10-CM

## 2018-12-29 DIAGNOSIS — R29898 Other symptoms and signs involving the musculoskeletal system: Secondary | ICD-10-CM

## 2018-12-29 DIAGNOSIS — R2689 Other abnormalities of gait and mobility: Secondary | ICD-10-CM

## 2018-12-29 NOTE — Patient Instructions (Signed)
Access Code: [email or web address]  URL: [email or web address]  Date: 12/29/2018  Prepared by: Wamic   [employer] with Anchored Resistance - 10 reps - 3 sets - 1x daily - 7x weekly Single Arm Shoulder Extension with Anchored Resistance - 10 reps - 3 sets - 1x daily - 7x weekly

## 2018-12-29 NOTE — Therapy (Signed)
[place], [place], [address] Phone: [phone]   Fax:  [phone]  Physical Therapy Treatment  Patient Details  Name: Melissa Velazquez MRN: [id number] Date of Birth: 01-Jun-1950 Referring Provider (PT): Levy Pupa PA-C   Encounter Date: 12/29/2018  PT End of Session - 12/29/18 0913    Visit Number  5    Number of Visits  12    Date for PT Re-Evaluation  01/23/19   minireassess 01/09/19   Authorization Type  Primary: Medicare Part A and B, Secondary: [employer] UHC MCR supp; (visit medicare guidlines, no auth)    Authorization Time Period  12/12/18-01/23/2019 (progress note at 10th visit)    Authorization - Visit Number  5    Authorization - Number of Visits  10    PT Start Time  0903    PT Stop Time  0945    PT Time Calculation (min)  42 min    Activity Tolerance  Patient tolerated treatment well    Behavior During Therapy  [facility] for tasks assessed/performed       Past Medical History:  Diagnosis Date  . Allergic rhinitis   . Anal fissure    Hx of   . Anxiety    hx of  . Arthritis   . Asthma    none in last year   . Depression    hx of  . Diabetes mellitus without complication (HCC)    no meds  . Diverticular disease   . Dysrhythmia    palpitations  . Endometrial cancer ([place]) 2001   spread to appendix   . Hemorrhoids   . Hyperglycemia   . Hypertension   . Hypothyroidism   . Obesity   . Sebaceous cyst   . Thyroid disease   . Thyroid nodule   . 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 APPENDECTOMY  2008  . BIOPSY  12/14/2016   Procedure: BIOPSY;  Surgeon: Rogene Houston, MD;  [facility];  Service: Endoscopy;;  colon  . CHOLECYSTECTOMY  2008  . COLONOSCOPY N/A 11/24/2012   Procedure: COLONOSCOPY;  Surgeon: Rogene Houston, MD;  [facility];  Service: Endoscopy;  Laterality: N/A;  830  . COLONOSCOPY N/A  12/14/2016   Procedure: COLONOSCOPY;  Surgeon: Rogene Houston, MD;  [facility];  Service: Endoscopy;  Laterality: N/A;  12:00  . POLYPECTOMY  12/14/2016   Procedure: POLYPECTOMY;  Surgeon: Rogene Houston, MD;  [facility];  Service: Endoscopy;;  colon  . ROBOTIC ASSITED PARTIAL NEPHRECTOMY Left 08/21/2016   Procedure: XI ROBOTIC ASSITED PARTIAL NEPHRECTOMY;  Surgeon: Ardis Hughs, MD;  [facility];  Service: Urology;  Laterality: Left;  . TOTAL ABDOMINAL HYSTERECTOMY  2001  . WISDOM TOOTH EXTRACTION      There were no vitals filed for this visit.  Subjective Assessment - 12/29/18 0902    Subjective  Patient states her back is better lately. She was able to complete self STM and she felt better after. She was able to complete HEP and some exercises are more difficult than others. She feels the extension exercises are helping.    Pertinent History  anxiety, obesity, sedendary lifestyle    Patient Stated Goals  decrease pain and be able to walk dog.    Currently in Pain?  No/denies  [facility] Adult PT Treatment/Exercise - 12/29/18 0001      Lumbar Exercises: Stretches   Standing Extension  2 reps;10 reps;5 seconds      Lumbar Exercises: Standing   Row  AROM;10 reps;Theraband    Theraband Level (Row)  Level 3 (Green)    Row Limitations  3 sets with verbal cueing for posture and scapular retraction    Shoulder Extension  AROM;Both;10 reps;Theraband    Theraband Level (Shoulder Extension)  Level 3 (Green)    Shoulder Extension Limitations  3 sets      Lumbar Exercises: Seated   Other Seated Lumbar Exercises  therapy ball: marches with verbal cueing for posture - too difficult, pelvic tilts - difficult with control, heel raises  3 x 10 bilateral with verbal cueing for core activation and posture             PT Education - 12/29/18 0949    Education Details  Patient educated on completing HEP, living an active  lifestyle and its importance    Person(s) Educated  Patient    Methods  Explanation;Handout    Comprehension  Verbalized understanding       PT Short Term Goals - 12/14/18 1044      PT SHORT TERM GOAL #1   Title  Patient will be independent with HEP in order to optimze functional outcomes    Time  3    Period  Weeks    Status  On-going    Target Date  01/02/19      PT SHORT TERM GOAL #2   Title  Patient will report 25% improvment of overall symptoms in order to improve tolerance to activity.    Time  3    Period  Weeks    Status  On-going    Target Date  01/02/19        PT Long Term Goals - 12/14/18 1044      PT LONG TERM GOAL #1   Title  Patient with report 50% reduction in symptoms for improved quality of life.    Time  6    Period  Weeks    Status  On-going      PT LONG TERM GOAL #2   Title  Patient will report ability to walk for 20 minutes in order to walk her dog.    Time  6    Period  Weeks    Status  On-going      PT LONG TERM GOAL #3   Title  Patient will have 10% in FOTO scrore in order to demonstrate improved tolerance to functional activity.    Baseline  48% limited    Time  6    Period  Weeks    Status  On-going      PT LONG TERM GOAL #4   Title  Patient will increase gait speed to at least 1.2 m/s in order to be able to cross the street safely.    Baseline  1 m/s    Time  6    Period  Weeks    Status  On-going            Plan - 12/29/18 0915    Clinical Impression Statement  Patient is tolerating extension based exercises well with an overall decrease in symptoms. She is able to complete standing rows and extensions with band for improving postural control with minimal verbal cueing for positioning. Patient experiences difficulty completing pelvic tilts and marches on therapy ball due  to impaired core strength and motor control. She is able to complete heel lifts on therapy ball with proper mechanics and good control. Patient will continue  to benefit from skilled physical therapy in order to improve function and reduce impairment.    Personal Factors and Comorbidities  Age;Behavior Pattern;Comorbidity 3+;Fitness    Comorbidities  anxiety, obesity, sedentary lifestyle    Examination-Activity Limitations  Bathing;Bed Mobility;Carry;Lift;Locomotion Level;Stairs;Stand;Transfers    Examination-Participation Restrictions  Church;Driving;Volunteer;Shop;Yard Work    [employer] officer  Evolving/Moderate complexity    Rehab Potential  Good    PT Frequency  2x / week    PT Duration  6 weeks    PT Treatment/Interventions  ADLs/Self Care Home Management;Aquatic Therapy;Biofeedback;Cryotherapy;Electrical Stimulation;Iontophoresis 4mg /ml Dexamethasone;Moist Heat;Traction;Ultrasound;DME Instruction;Gait training;Stair training;Functional mobility training;Therapeutic activities;Therapeutic exercise;Balance training;Neuromuscular re-education;Patient/family education;Orthotic Fit/Training;Manual techniques;Compression bandaging;Passive range of motion;Dry needling;Energy conservation;Splinting;Taping;Spinal Manipulations;Joint Manipulations    PT Next Visit Plan  assess response to manual therapy and HEP. continue to progress core/hip strengthening. add exercises on therapy ball as tolerated    PT Home Exercise Plan  12/14/18: self STM with ball, repeated prone press ups 2x8, bridges 2x5 with 2 second holds 12/26/18: standing extensions at wall and away from wall 12/29/18: standing rows and extensions with green band 3x10       Patient will benefit from skilled therapeutic intervention in order to improve the following deficits and impairments:  Abnormal gait, Cardiopulmonary status limiting activity, Decreased activity tolerance, Decreased balance, Decreased endurance, Decreased mobility, Decreased range of motion, Decreased strength, Difficulty walking, Impaired flexibility, Improper body mechanics, Postural dysfunction, Pain,  Obesity  Visit Diagnosis: Muscle weakness (generalized)  Other symptoms and signs involving the musculoskeletal system  Other abnormalities of gait and mobility  Low back pain, unspecified back pain laterality, unspecified chronicity, unspecified whether sciatica present     Problem List Patient Active Problem List   Diagnosis Date Noted  . History of colonic polyps 11/25/2016  . Renal mass 08/21/2016  . Upper airway cough syndrome 07/06/2014  . Essential hypertension 07/06/2014  . Personal history of colonic polyps 09/17/2010  . BACK PAIN 09/14/2008  . HIRSUTISM 06/15/2008  . GLUCOSE INTOLERANCE 01/09/2008  . NEOPLASM, MALIGNANT, UTERUS 12/27/2006  . THYROID NODULE 12/27/2006  . DIVERTICULAR DISEASE 06/03/2006  . OBESITY NOS 03/09/2006  . DEPRESSION 03/09/2006  . ALLERGIC RHINITIS 03/09/2006  . ASTHMA 03/09/2006  . GERD 03/09/2006  . ELEVATED BLOOD PRESSURE WITHOUT DIAGNOSIS OF HYPERTENSION 03/09/2006    11:02 AM, 12/29/18 Mearl Latin PT, DPT Physical Therapist at [place], [place], [address] Phone: [phone]   Fax:  [phone]  Name: Melissa Velazquez MRN: [id number] Date of Birth: 14-Feb-1950

## 2018-12-30 ENCOUNTER — Ambulatory Visit (INDEPENDENT_AMBULATORY_CARE_PROVIDER_SITE_OTHER): Payer: Medicare Other | Admitting: Cardiology

## 2018-12-30 ENCOUNTER — Encounter: Payer: Self-pay | Admitting: Cardiology

## 2018-12-30 VITALS — BP 135/79 | HR 65 | Ht 64.0 in | Wt 232.0 lb

## 2018-12-30 DIAGNOSIS — R002 Palpitations: Secondary | ICD-10-CM

## 2018-12-30 DIAGNOSIS — F341 Dysthymic disorder: Secondary | ICD-10-CM | POA: Diagnosis not present

## 2018-12-30 NOTE — Patient Instructions (Signed)

## 2018-12-30 NOTE — Progress Notes (Signed)
Clinical Summary Ms. Levendusky is a 68 y.o.female seen as new consult, referred by Dr Hilma Favors for palpitations.   1. Palpitations - on and off for the year - 10/2018 increase freuqency. - feeling of heart flutterin. No other associated symptoms - most often occurs at rest. Lasts just a few minutes.   - coffee x 2 cups, few times a week, rare sodas, no energy drinks, no EtOH.   - recently 1-2 times per week, lasting just a few seconds.      Past Medical History:  Diagnosis Date  . Allergic rhinitis   . Anal fissure    Hx of   . Anxiety    hx of  . Arthritis   . Asthma    none in last year   . Depression    hx of  . Diabetes mellitus without complication (HCC)    no meds  . Diverticular disease   . Dysrhythmia    palpitations  . Endometrial cancer ([place]) 2001   spread to appendix   . Hemorrhoids   . Hyperglycemia   . Hypertension   . Hypothyroidism   . Obesity   . Sebaceous cyst   . Thyroid disease   . Thyroid nodule   . 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 Triple Antibiotic [Bacitracin-Neomycin-Polymyxin] Anaphylaxis  . Levofloxacin Other (See Comments)    Stomach upset  . Capsaicin Rash  . Elastic Bandages & [Zinc] Rash     Current Outpatient Medications  Medication Sig Dispense Refill  . albuterol (PROVENTIL HFA;VENTOLIN HFA) 108 (90 Base) MCG/ACT inhaler Inhale 2 puffs into the lungs every 6 (six) hours as needed for wheezing or shortness of breath.    . celecoxib (CELEBREX) 200 MG capsule Take 1 capsule (200 mg total) by mouth daily as needed (for arthritis pain.).    Marland Kitchen cetirizine (ZYRTEC) 10 MG tablet Take 10 mg by mouth daily as needed for allergies.    Marland Kitchen dicyclomine (BENTYL) 10 MG capsule Take 1 capsule (10 mg total) by mouth 2 (two) times daily before a meal. 60 capsule 5  . dicyclomine (BENTYL) 20 MG tablet Take 1 tablet (20 mg total) by mouth 2 (two) times a day. 120 tablet 3  . fluticasone  (FLONASE) 50 MCG/ACT nasal spray Place 1 spray into both nostrils daily as needed for allergies or rhinitis.    Marland Kitchen levothyroxine (SYNTHROID, LEVOTHROID) 75 MCG tablet Take 75 mcg by mouth daily before breakfast.     . NON FORMULARY CBD oil - 15 -20 drops a day of the 500 mg.    . Probiotic Product (PROBIOTIC PO) Take by mouth. This is Raw Probiotic -  Takes 1 po daily.    . psyllium (METAMUCIL SMOOTH TEXTURE) 28 % packet Take 1 packet by mouth at bedtime.    . sertraline (ZOLOFT) 100 MG tablet Take 150 mg by mouth daily with breakfast.     . triamcinolone cream (KENALOG) 0.1 % Apply 1 application topically daily as needed (rash).    . TURMERIC CURCUMIN PO Take 2,000 mg by mouth daily.      No current facility-administered medications for this visit.     Past Surgical History:  Procedure Laterality Date  . APPENDECTOMY  2008  . BIOPSY  12/14/2016   Procedure: BIOPSY;  Surgeon: Rogene Houston, MD;  [facility];  Service: Endoscopy;;  colon  . CHOLECYSTECTOMY  2008  . COLONOSCOPY N/A  11/24/2012   Procedure: COLONOSCOPY;  Surgeon: Rogene Houston, MD;  [facility];  Service: Endoscopy;  Laterality: N/A;  830  . COLONOSCOPY N/A 12/14/2016   Procedure: COLONOSCOPY;  Surgeon: Rogene Houston, MD;  [facility];  Service: Endoscopy;  Laterality: N/A;  12:00  . POLYPECTOMY  12/14/2016   Procedure: POLYPECTOMY;  Surgeon: Rogene Houston, MD;  [facility];  Service: Endoscopy;;  colon  . ROBOTIC ASSITED PARTIAL NEPHRECTOMY Left 08/21/2016   Procedure: XI ROBOTIC ASSITED PARTIAL NEPHRECTOMY;  Surgeon: Ardis Hughs, MD;  [facility];  Service: Urology;  Laterality: Left;  . TOTAL ABDOMINAL HYSTERECTOMY  2001  . WISDOM TOOTH EXTRACTION       Allergies  Allergen Reactions  . Triple Antibiotic [Bacitracin-Neomycin-Polymyxin] Anaphylaxis  . Levofloxacin Other (See Comments)    Stomach upset  . Capsaicin Rash  . Elastic Bandages & [Zinc]  Rash      Family History  Problem Relation Age of Onset  . Breast cancer Mother   . Diabetes Mother   . Obesity Sister   . Asthma Sister   . COPD Father        smoked  . Prostate cancer Father   . Breast cancer Maternal Aunt   . Colon cancer Neg Hx      Social History Ms. Mileski reports that she quit smoking about 20 years ago. Her smoking use included cigarettes. She has a 20.00 pack-year smoking history. She has never used smokeless tobacco. Ms. Metze reports current alcohol use.   Review of Systems CONSTITUTIONAL: No weight loss, fever, chills, weakness or fatigue.  HEENT: Eyes: No visual loss, blurred vision, double vision or yellow sclerae.No hearing loss, sneezing, congestion, runny nose or sore throat.  SKIN: No rash or itching.  CARDIOVASCULAR: per hpi RESPIRATORY: No shortness of breath, cough or sputum.  GASTROINTESTINAL: No anorexia, nausea, vomiting or diarrhea. No abdominal pain or blood.  GENITOURINARY: No burning on urination, no polyuria NEUROLOGICAL: No headache, dizziness, syncope, paralysis, ataxia, numbness or tingling in the extremities. No change in bowel or bladder control.  MUSCULOSKELETAL: No muscle, back pain, joint pain or stiffness.  LYMPHATICS: No enlarged nodes. No history of splenectomy.  PSYCHIATRIC: No history of depression or anxiety.  ENDOCRINOLOGIC: No reports of sweating, cold or heat intolerance. No polyuria or polydipsia.  Marland Kitchen   Physical Examination Today's Vitals   12/30/18 0828  BP: 135/79  Pulse: 65  SpO2: 96%  Weight: 232 lb (105.2 kg)  Height: 5\' 4"  (1.626 m)   Body mass index is 39.82 kg/m.  Gen: resting comfortably, no acute distress HEENT: no scleral icterus, pupils equal round and reactive, no palptable cervical adenopathy,  CV: RRR, no m/r/g, no jvd Resp: Clear to auscultation bilaterally GI: abdomen is soft, non-tender, non-distended, normal bowel sounds, no hepatosplenomegaly MSK: extremities are warm, no  edema.  Skin: warm, no rash Neuro:  no focal deficits Psych: appropriate affect   Diagnostic Studies     Assessment and Plan  1. Palpitations - symptoms have significantly quieted down since October. Continue to monitor at this time, if increase in frequency or severity over time would plan for home event monitor - baselien EKG from pcp shows NSR     F/u 6 months  Arnoldo Lenis, M.D.

## 2019-01-02 ENCOUNTER — Ambulatory Visit (HOSPITAL_COMMUNITY): Payer: Medicare Other | Admitting: Physical Therapy

## 2019-01-02 ENCOUNTER — Other Ambulatory Visit: Payer: Self-pay

## 2019-01-02 ENCOUNTER — Encounter (HOSPITAL_COMMUNITY): Payer: Self-pay | Admitting: Physical Therapy

## 2019-01-02 DIAGNOSIS — R2689 Other abnormalities of gait and mobility: Secondary | ICD-10-CM

## 2019-01-02 DIAGNOSIS — M6281 Muscle weakness (generalized): Secondary | ICD-10-CM | POA: Diagnosis not present

## 2019-01-02 DIAGNOSIS — M545 Low back pain, unspecified: Secondary | ICD-10-CM

## 2019-01-02 DIAGNOSIS — R29898 Other symptoms and signs involving the musculoskeletal system: Secondary | ICD-10-CM

## 2019-01-02 NOTE — Therapy (Signed)
[place], [place], [address] Phone: [phone]   Fax:  [phone]  Physical Therapy Treatment  Patient Details  Name: Melissa Velazquez MRN: [id number] Date of Birth: 09-10-1950 Referring Provider (PT): Levy Pupa PA-C   Encounter Date: 01/02/2019  PT End of Session - 01/02/19 1037    Visit Number  6    Number of Visits  12    Date for PT Re-Evaluation  01/23/19   minireassess 01/09/19   Authorization Type  Primary: Medicare Part A and B, Secondary: [employer] UHC MCR supp; (visit medicare guidlines, no auth)    Authorization Time Period  12/12/18-01/23/2019 (progress note at 10th visit)    Authorization - Visit Number  6    Authorization - Number of Visits  10    PT Start Time  0903    PT Stop Time  0945    PT Time Calculation (min)  42 min    Activity Tolerance  Patient tolerated treatment well    Behavior During Therapy  [facility] for tasks assessed/performed       Past Medical History:  Diagnosis Date  . Allergic rhinitis   . Anal fissure    Hx of   . Anxiety    hx of  . Arthritis   . Asthma    none in last year   . Depression    hx of  . Diabetes mellitus without complication (HCC)    no meds  . Diverticular disease   . Dysrhythmia    palpitations  . Endometrial cancer ([place]) 2001   spread to appendix   . Hemorrhoids   . Hyperglycemia   . Hypertension   . Hypothyroidism   . Obesity   . Sebaceous cyst   . Thyroid disease   . Thyroid nodule   . 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 APPENDECTOMY  2008  . BIOPSY  12/14/2016   Procedure: BIOPSY;  Surgeon: Rogene Houston, MD;  [facility];  Service: Endoscopy;;  colon  . CHOLECYSTECTOMY  2008  . COLONOSCOPY N/A 11/24/2012   Procedure: COLONOSCOPY;  Surgeon: Rogene Houston, MD;  [facility];  Service: Endoscopy;  Laterality: N/A;  830  . COLONOSCOPY N/A  12/14/2016   Procedure: COLONOSCOPY;  Surgeon: Rogene Houston, MD;  [facility];  Service: Endoscopy;  Laterality: N/A;  12:00  . POLYPECTOMY  12/14/2016   Procedure: POLYPECTOMY;  Surgeon: Rogene Houston, MD;  [facility];  Service: Endoscopy;;  colon  . ROBOTIC ASSITED PARTIAL NEPHRECTOMY Left 08/21/2016   Procedure: XI ROBOTIC ASSITED PARTIAL NEPHRECTOMY;  Surgeon: Ardis Hughs, MD;  [facility];  Service: Urology;  Laterality: Left;  . TOTAL ABDOMINAL HYSTERECTOMY  2001  . WISDOM TOOTH EXTRACTION      There were no vitals filed for this visit.  Subjective Assessment - 01/02/19 0906    Subjective  Patient states her back has been doing better. The knot in her glutes have been doing better. She feels it's good that she feels like there is something she can do about her symptoms. She feels her walking is off and that causes more back problems.    Pertinent History  anxiety, obesity, sedendary lifestyle    Patient Stated Goals  decrease pain and be able to walk dog.    Currently in Pain?  No/denies  Pain Score  --   4/10 at worst   Pain Location  Back                       [facility] Adult PT Treatment/Exercise - 01/02/19 0001      Ambulation/Gait   Ambulation/Gait  Yes    Ambulation/Gait Assistance  7: Independent    Ambulation Distance (Feet)  226 Feet    Gait Pattern  Decreased step length - left;Decreased step length - right;Decreased stride length;Trendelenburg;Decreased trunk rotation;Lateral trunk lean to right;Lateral trunk lean to left    Ambulation Surface  Level;Indoor      Lumbar Exercises: Stretches   Standing Extension  2 reps;10 reps;5 seconds    Other Lumbar Stretch Exercise  standing hip abduction with verbal cueing and demonstration for prior glute activation - 3x 10 bilateral      Lumbar Exercises: Supine   Other Supine Lumbar Exercises  ab sets with marches in hooklying 4x10 bilateral      Lumbar Exercises:  Sidelying   Clam  Both;20 reps    Clam Limitations  2 sets             PT Education - 01/02/19 0918    Education Details  Patient educated on completing HEP and making modifications as needed if she has knee pain, hip weakness and possible contribution to LBP    Person(s) Educated  Patient    Methods  Explanation    Comprehension  Verbalized understanding       PT Short Term Goals - 12/14/18 1044      PT SHORT TERM GOAL #1   Title  Patient will be independent with HEP in order to optimze functional outcomes    Time  3    Period  Weeks    Status  On-going    Target Date  01/02/19      PT SHORT TERM GOAL #2   Title  Patient will report 25% improvment of overall symptoms in order to improve tolerance to activity.    Time  3    Period  Weeks    Status  On-going    Target Date  01/02/19        PT Long Term Goals - 12/14/18 1044      PT LONG TERM GOAL #1   Title  Patient with report 50% reduction in symptoms for improved quality of life.    Time  6    Period  Weeks    Status  On-going      PT LONG TERM GOAL #2   Title  Patient will report ability to walk for 20 minutes in order to walk her dog.    Time  6    Period  Weeks    Status  On-going      PT LONG TERM GOAL #3   Title  Patient will have 10% in FOTO scrore in order to demonstrate improved tolerance to functional activity.    Baseline  48% limited    Time  6    Period  Weeks    Status  On-going      PT LONG TERM GOAL #4   Title  Patient will increase gait speed to at least 1.2 m/s in order to be able to cross the street safely.    Baseline  1 m/s    Time  6    Period  Weeks    Status  On-going  Plan - 01/02/19 1038    Clinical Impression Statement  Patient requires frequent verbal cueing for prior glute activation with standing hip abduction exercise and experiences intermittent increase in L knee pain with weightbearing. She is able to complete clams and ab sets with marches for  glute/core strengthening with proper form and mechanics. Patient ambulates with bilateral Trendelenburg gait pattern secondary to impaired hip abductor strength which may contribute to low back symptoms.  She requires frequent rest breaks throughout today's session secondary to fatigue and knee pain. Patient will continue to benefit from skilled physical therapy in order to reduce impairment and improve function.    Personal Factors and Comorbidities  Age;Behavior Pattern;Comorbidity 3+;Fitness    Comorbidities  anxiety, obesity, sedentary lifestyle    Examination-Activity Limitations  Bathing;Bed Mobility;Carry;Lift;Locomotion Level;Stairs;Stand;Transfers    Examination-Participation Restrictions  Church;Driving;Volunteer;Shop;Yard Work    [employer]  Evolving/Moderate complexity    Rehab Potential  Good    PT Frequency  2x / week    PT Duration  6 weeks    PT Treatment/Interventions  ADLs/Self Care Home Management;Aquatic Therapy;Biofeedback;Cryotherapy;Electrical Stimulation;Iontophoresis 4mg /ml Dexamethasone;Moist Heat;Traction;Ultrasound;DME Instruction;Gait training;Stair training;Functional mobility training;Therapeutic activities;Therapeutic exercise;Balance training;Neuromuscular re-education;Patient/family education;Orthotic Fit/Training;Manual techniques;Compression bandaging;Passive range of motion;Dry needling;Energy conservation;Splinting;Taping;Spinal Manipulations;Joint Manipulations    PT Next Visit Plan  possibly add manual therapy again if needed, continue to progress core/hip strengthening. add exercises on therapy ball as tolerated    PT Home Exercise Plan  12/14/18: self STM with ball, repeated prone press ups 2x8, bridges 2x5 with 2 second holds 12/26/18: standing extensions at wall and away from wall 12/29/18: standing rows and extensions with green band 3x10 01/02/19: clams and standing hip abduction 2x10 bilateral       Patient will benefit from skilled  therapeutic intervention in order to improve the following deficits and impairments:  Abnormal gait, Cardiopulmonary status limiting activity, Decreased activity tolerance, Decreased balance, Decreased endurance, Decreased mobility, Decreased range of motion, Decreased strength, Difficulty walking, Impaired flexibility, Improper body mechanics, Postural dysfunction, Pain, Obesity  Visit Diagnosis: Muscle weakness (generalized)  Other symptoms and signs involving the musculoskeletal system  Other abnormalities of gait and mobility  Low back pain, unspecified back pain laterality, unspecified chronicity, unspecified whether sciatica present     Problem List Patient Active Problem List   Diagnosis Date Noted  . History of colonic polyps 11/25/2016  . Renal mass 08/21/2016  . Upper airway cough syndrome 07/06/2014  . Essential hypertension 07/06/2014  . Personal history of colonic polyps 09/17/2010  . BACK PAIN 09/14/2008  . HIRSUTISM 06/15/2008  . GLUCOSE INTOLERANCE 01/09/2008  . NEOPLASM, MALIGNANT, UTERUS 12/27/2006  . THYROID NODULE 12/27/2006  . DIVERTICULAR DISEASE 06/03/2006  . OBESITY NOS 03/09/2006  . DEPRESSION 03/09/2006  . ALLERGIC RHINITIS 03/09/2006  . ASTHMA 03/09/2006  . GERD 03/09/2006  . ELEVATED BLOOD PRESSURE WITHOUT DIAGNOSIS OF HYPERTENSION 03/09/2006    10:48 AM, 01/02/19 Mearl Latin PT, DPT Physical Therapist at [place] [place], [place], [address] Phone: [phone]   Fax:  [phone]  Name: Melissa Velazquez MRN: [id number] Date of Birth: 01-22-1950

## 2019-01-02 NOTE — Patient Instructions (Signed)
Access Code: [email or web address]  URL: [email or web address]  Date: 01/02/2019  Prepared by: Josepha Barbier   Exercises Clamshell - 20 reps - 2 sets - 1x daily - 7x weekly Standing Hip Abduction with Counter Support - 10 reps - 2 sets - 1x daily - 7x weekly

## 2019-01-05 ENCOUNTER — Ambulatory Visit (HOSPITAL_COMMUNITY): Payer: Medicare Other | Admitting: Physical Therapy

## 2019-01-05 ENCOUNTER — Telehealth (HOSPITAL_COMMUNITY): Payer: Self-pay | Admitting: Physical Therapy

## 2019-01-05 NOTE — Telephone Encounter (Signed)
pt called to cancel this appt for today due to she is not feeling well.

## 2019-01-09 ENCOUNTER — Ambulatory Visit (HOSPITAL_COMMUNITY): Payer: Medicare Other | Admitting: Physical Therapy

## 2019-01-09 ENCOUNTER — Telehealth (HOSPITAL_COMMUNITY): Payer: Self-pay | Admitting: Physical Therapy

## 2019-01-09 NOTE — Telephone Encounter (Signed)
Patient no show for today's appointment, left voicemail for patient making her aware of missed appointment and reminding her of next appointment.  10:59 AM, 01/09/19 Mearl Latin PT, DPT Physical Therapist at [facility]

## 2019-01-10 ENCOUNTER — Telehealth (HOSPITAL_COMMUNITY): Payer: Self-pay | Admitting: Physical Therapy

## 2019-01-10 NOTE — Telephone Encounter (Signed)
This pt wants to be discharged. She called and said she had other issues going on. We will cancel all of her appts.

## 2019-01-11 ENCOUNTER — Ambulatory Visit (HOSPITAL_COMMUNITY): Payer: Medicare Other | Admitting: Physical Therapy

## 2019-01-11 ENCOUNTER — Encounter (HOSPITAL_COMMUNITY): Payer: Self-pay | Admitting: Physical Therapy

## 2019-01-11 NOTE — Therapy (Addendum)
[place], [place], [address] Phone: [phone]   Fax:  [phone]  Patient Details  Name: Melissa Velazquez MRN: [id number] Date of Birth: 12/21/1950 Referring Provider:  No ref. provider found  Encounter Date: 01/11/2019  PHYSICAL THERAPY DISCHARGE SUMMARY  Visits from Start of Care: 6  Current functional level related to goals / functional outcomes: Patient has made good progress toward goals and has improved in function and symptoms as well as decreased pain since beginning physical therapy.   Remaining deficits: She continues pain limited deficits in lumbar strength, ROM, endurance, postural impairments, and functional mobility with ADL.   Education / Equipment: Patient has been educated on completing a walking program, increasing exercise, importance of exercise for healthy lifestyle, frequency of performing exercises, low back pain, lumbar disc issues, physical therapy management of symptoms, and modifying exercises if she has knee pain. Plan: Patient agrees to discharge.  Patient goals were not met. Patient is being discharged due to the patient's request.  ?????     8:27 AM, 01/11/19 Mearl Latin PT, DPT Physical Therapist at [place], [place], [address] Phone: [phone]   Fax:  [phone]

## 2019-01-16 ENCOUNTER — Encounter (HOSPITAL_COMMUNITY): Payer: Medicare Other | Admitting: Physical Therapy

## 2019-01-18 ENCOUNTER — Encounter (HOSPITAL_COMMUNITY): Payer: Medicare Other | Admitting: Physical Therapy

## 2019-01-19 ENCOUNTER — Other Ambulatory Visit (INDEPENDENT_AMBULATORY_CARE_PROVIDER_SITE_OTHER): Payer: Self-pay | Admitting: Gastroenterology

## 2019-01-19 DIAGNOSIS — M1991 Primary osteoarthritis, unspecified site: Secondary | ICD-10-CM | POA: Diagnosis not present

## 2019-01-19 DIAGNOSIS — I10 Essential (primary) hypertension: Secondary | ICD-10-CM | POA: Diagnosis not present

## 2019-01-19 DIAGNOSIS — E039 Hypothyroidism, unspecified: Secondary | ICD-10-CM | POA: Diagnosis not present

## 2019-01-19 NOTE — Progress Notes (Signed)
[facility] [place] with refill request-unable to send electronically.  Verbal refill authorized.  Per pharmacist taking dicyclomine 20 mg once a day-refill given.

## 2019-01-23 ENCOUNTER — Encounter (HOSPITAL_COMMUNITY): Payer: Medicare Other | Admitting: Physical Therapy

## 2019-02-03 DIAGNOSIS — F341 Dysthymic disorder: Secondary | ICD-10-CM | POA: Diagnosis not present

## 2019-02-03 DIAGNOSIS — Z23 Encounter for immunization: Secondary | ICD-10-CM | POA: Diagnosis not present

## 2019-02-10 DIAGNOSIS — F341 Dysthymic disorder: Secondary | ICD-10-CM | POA: Diagnosis not present

## 2019-02-17 DIAGNOSIS — F341 Dysthymic disorder: Secondary | ICD-10-CM | POA: Diagnosis not present

## 2019-02-19 DIAGNOSIS — M1991 Primary osteoarthritis, unspecified site: Secondary | ICD-10-CM | POA: Diagnosis not present

## 2019-02-19 DIAGNOSIS — I10 Essential (primary) hypertension: Secondary | ICD-10-CM | POA: Diagnosis not present

## 2019-02-19 DIAGNOSIS — E039 Hypothyroidism, unspecified: Secondary | ICD-10-CM | POA: Diagnosis not present

## 2019-02-24 DIAGNOSIS — F341 Dysthymic disorder: Secondary | ICD-10-CM | POA: Diagnosis not present

## 2019-03-03 DIAGNOSIS — F341 Dysthymic disorder: Secondary | ICD-10-CM | POA: Diagnosis not present

## 2019-03-03 DIAGNOSIS — Z23 Encounter for immunization: Secondary | ICD-10-CM | POA: Diagnosis not present

## 2019-03-10 DIAGNOSIS — F341 Dysthymic disorder: Secondary | ICD-10-CM | POA: Diagnosis not present

## 2019-03-17 DIAGNOSIS — F341 Dysthymic disorder: Secondary | ICD-10-CM | POA: Diagnosis not present

## 2019-03-24 DIAGNOSIS — F341 Dysthymic disorder: Secondary | ICD-10-CM | POA: Diagnosis not present

## 2019-03-30 DIAGNOSIS — M170 Bilateral primary osteoarthritis of knee: Secondary | ICD-10-CM | POA: Diagnosis not present

## 2019-03-31 DIAGNOSIS — F341 Dysthymic disorder: Secondary | ICD-10-CM | POA: Diagnosis not present

## 2019-04-04 ENCOUNTER — Ambulatory Visit (INDEPENDENT_AMBULATORY_CARE_PROVIDER_SITE_OTHER): Payer: Medicare Other | Admitting: Internal Medicine

## 2019-04-06 DIAGNOSIS — E669 Obesity, unspecified: Secondary | ICD-10-CM | POA: Diagnosis not present

## 2019-04-07 DIAGNOSIS — F341 Dysthymic disorder: Secondary | ICD-10-CM | POA: Diagnosis not present

## 2019-04-11 ENCOUNTER — Encounter (INDEPENDENT_AMBULATORY_CARE_PROVIDER_SITE_OTHER): Payer: Self-pay | Admitting: Internal Medicine

## 2019-04-11 ENCOUNTER — Other Ambulatory Visit: Payer: Self-pay

## 2019-04-11 ENCOUNTER — Ambulatory Visit (INDEPENDENT_AMBULATORY_CARE_PROVIDER_SITE_OTHER): Payer: Medicare Other | Admitting: Internal Medicine

## 2019-04-11 DIAGNOSIS — K580 Irritable bowel syndrome with diarrhea: Secondary | ICD-10-CM | POA: Diagnosis not present

## 2019-04-11 DIAGNOSIS — K588 Other irritable bowel syndrome: Secondary | ICD-10-CM | POA: Diagnosis not present

## 2019-04-11 DIAGNOSIS — K589 Irritable bowel syndrome without diarrhea: Secondary | ICD-10-CM | POA: Insufficient documentation

## 2019-04-11 MED ORDER — DICYCLOMINE HCL 20 MG PO TABS: 20.0000 mg | ORAL_TABLET | Freq: Every day | ORAL | 3 refills | Status: DC

## 2019-04-11 NOTE — Patient Instructions (Signed)
Can try decreasing dicyclomine dose to 15 mg every morning if you can safely divide the pill.

## 2019-04-11 NOTE — Progress Notes (Signed)
Presenting complaint;  Follow-up for IBS-D.  Database and subjective:  Patient is 69 year old Caucasian female who is here for yearly visit.  She has irritable bowel syndrome with diarrhea.  She was last seen 1 year ago.  She was given prescription for Xifaxan for 2 weeks.  She did not see much improvement.  Since she has been taking dicyclomine 20 mg daily before breakfast she has done much better.  She is having average of 3 stools per day.  Most of her stools are now formed.  Every now and then she feels as if she is constipated.  Before she went on antispasmodic she was having an average of 6 stools per day.  She denies abdominal pain melena or rectal bleeding.  She has not had any accidents since she was last seen.  She does complain of dry mouth.  She says she has not used inhaler in over 2 years.  Her appetite is good.  She has lost 8 pounds since her last visit.  She feels weight loss is voluntary. She says she does not take Metamucil regularly.   Current Medications: Outpatient Encounter Medications as of 04/11/2019  Medication Sig  . albuterol (PROVENTIL HFA;VENTOLIN HFA) 108 (90 Base) MCG/ACT inhaler Inhale 2 puffs into the lungs every 6 (six) hours as needed for wheezing or shortness of breath.  . cetirizine (ZYRTEC) 10 MG tablet Take 10 mg by mouth daily as needed for allergies.  Marland Kitchen dicyclomine (BENTYL) 20 MG tablet Take 1 tablet (20 mg total) by mouth 2 (two) times a day.  . fluticasone (FLONASE) 50 MCG/ACT nasal spray Place 1 spray into both nostrils daily as needed for allergies or rhinitis.  Marland Kitchen levothyroxine (SYNTHROID, LEVOTHROID) 75 MCG tablet Take 75 mcg by mouth daily before breakfast.   . meloxicam (MOBIC) 7.5 MG tablet Take 7.5 mg by mouth daily.  . NON FORMULARY CBD oil - 15 -20 drops a day of the 500 mg.  . phentermine 37.5 MG capsule Take 37.5 mg by mouth. Patient takes 1/2 daily with breakfast.  . Probiotic Product (PROBIOTIC PO) Take by mouth. This is Raw Probiotic -   Takes 1 po daily.  . sertraline (ZOLOFT) 100 MG tablet Take 150 mg by mouth daily with breakfast.   . triamcinolone cream (KENALOG) 0.1 % Apply 1 application topically daily as needed (rash).  . TURMERIC CURCUMIN PO Take 2,000 mg by mouth daily.   . psyllium (METAMUCIL SMOOTH TEXTURE) 28 % packet Take 1 packet by mouth at bedtime. (Patient not taking: Reported on 04/11/2019)   No facility-administered encounter medications on file as of 04/11/2019.     Objective: Blood pressure 135/86, pulse 92, temperature (!) 97.3 F (36.3 C), temperature source Temporal, height 5\' 4"  (1.626 m), weight 224 lb 8 oz (101.8 kg). Patient is alert and in no acute distress. Patient is wearing facial mask. Conjunctiva is pink. Sclera is nonicteric Oropharyngeal mucosa is normal. No neck masses or thyromegaly noted. Cardiac exam with regular rhythm normal S1 and S2. No murmur or gallop noted. Lungs are clear to auscultation. Abdomen is protuberant.  Bowel sounds are normal.  On palpation abdomen is soft and nontender with organomegaly or masses. No LE edema or clubbing noted.   Assessment:  Irritable bowel syndrome with diarrhea.  She appears to be doing better with single dicyclomine dose of 20 mg before breakfast.  No she is passing some hard dry stools.  She is also having some dry mouth.  She may try taking 15  mg of dicyclomine daily if she can get her pharmacist to divide the pill into 4 parts and she can 3 parts each morning.  Plan:  Patient advised to take Metamucil every day for 2 months or so if it helps then she should continue otherwise she can stop it. Decrease dicyclomine dose to 15 mg by mouth daily before breakfast if feasible. Office visit in 1 year.

## 2019-04-12 DIAGNOSIS — D3131 Benign neoplasm of right choroid: Secondary | ICD-10-CM | POA: Diagnosis not present

## 2019-04-14 DIAGNOSIS — F341 Dysthymic disorder: Secondary | ICD-10-CM | POA: Diagnosis not present

## 2019-04-19 DIAGNOSIS — I10 Essential (primary) hypertension: Secondary | ICD-10-CM | POA: Diagnosis not present

## 2019-04-19 DIAGNOSIS — E039 Hypothyroidism, unspecified: Secondary | ICD-10-CM | POA: Diagnosis not present

## 2019-04-19 DIAGNOSIS — M1991 Primary osteoarthritis, unspecified site: Secondary | ICD-10-CM | POA: Diagnosis not present

## 2019-04-21 DIAGNOSIS — F341 Dysthymic disorder: Secondary | ICD-10-CM | POA: Diagnosis not present

## 2019-04-28 DIAGNOSIS — F341 Dysthymic disorder: Secondary | ICD-10-CM | POA: Diagnosis not present

## 2019-05-05 DIAGNOSIS — F341 Dysthymic disorder: Secondary | ICD-10-CM | POA: Diagnosis not present

## 2019-05-12 DIAGNOSIS — F341 Dysthymic disorder: Secondary | ICD-10-CM | POA: Diagnosis not present

## 2019-05-18 DIAGNOSIS — L570 Actinic keratosis: Secondary | ICD-10-CM | POA: Diagnosis not present

## 2019-05-18 DIAGNOSIS — L918 Other hypertrophic disorders of the skin: Secondary | ICD-10-CM | POA: Diagnosis not present

## 2019-05-18 DIAGNOSIS — D225 Melanocytic nevi of trunk: Secondary | ICD-10-CM | POA: Diagnosis not present

## 2019-05-18 DIAGNOSIS — L821 Other seborrheic keratosis: Secondary | ICD-10-CM | POA: Diagnosis not present

## 2019-05-19 DIAGNOSIS — F341 Dysthymic disorder: Secondary | ICD-10-CM | POA: Diagnosis not present

## 2019-05-26 DIAGNOSIS — F341 Dysthymic disorder: Secondary | ICD-10-CM | POA: Diagnosis not present

## 2019-06-02 DIAGNOSIS — F341 Dysthymic disorder: Secondary | ICD-10-CM | POA: Diagnosis not present

## 2019-06-09 DIAGNOSIS — F341 Dysthymic disorder: Secondary | ICD-10-CM | POA: Diagnosis not present

## 2019-06-23 DIAGNOSIS — F341 Dysthymic disorder: Secondary | ICD-10-CM | POA: Diagnosis not present

## 2019-06-30 DIAGNOSIS — F341 Dysthymic disorder: Secondary | ICD-10-CM | POA: Diagnosis not present

## 2019-07-07 DIAGNOSIS — F341 Dysthymic disorder: Secondary | ICD-10-CM | POA: Diagnosis not present

## 2019-07-14 ENCOUNTER — Other Ambulatory Visit: Payer: Self-pay

## 2019-07-14 ENCOUNTER — Ambulatory Visit (INDEPENDENT_AMBULATORY_CARE_PROVIDER_SITE_OTHER): Payer: Medicare Other | Admitting: Cardiology

## 2019-07-14 VITALS — BP 114/66 | HR 76 | Ht 64.0 in | Wt 202.0 lb

## 2019-07-14 DIAGNOSIS — Z01810 Encounter for preprocedural cardiovascular examination: Secondary | ICD-10-CM | POA: Diagnosis not present

## 2019-07-14 DIAGNOSIS — R002 Palpitations: Secondary | ICD-10-CM

## 2019-07-14 NOTE — Patient Instructions (Addendum)
Medication Instructions:  Your physician recommends that you continue on your current medications as directed. Please refer to the Current Medication list given to you today.  *If you need a refill on your cardiac medications before your next appointment, please call your pharmacy*   Lab Work: None ordered  If you have labs (blood work) drawn today and your tests are completely normal, you will receive your results only by: Marland Kitchen MyChart Message (if you have MyChart) OR . A paper copy in the mail If you have any lab test that is abnormal or we need to change your treatment, we will call you to review the results.   Testing/Procedures: None ordered   Follow-Up: At [facility] you and your health needs are our priority.  As part of our continuing mission to provide you with exceptional heart care, we have created designated Provider Care Teams.  These Care Teams include your primary Cardiologist (physician) and Advanced Practice Providers (APPs -  Physician Assistants and Nurse Practitioners) who all work together to provide you with the care you need, when you need it.  We recommend signing up for the patient portal called "MyChart".  Sign up information is provided on this After Visit Summary.  MyChart is used to connect with patients for Virtual Visits (Telemedicine).  Patients are able to view lab/test results, encounter notes, upcoming appointments, etc.  Non-urgent messages can be sent to your provider as well.   To learn more about what you can do with MyChart, go to [email or web address].    Your next appointment:   12 month(s)  The format for your next appointment:   In Person  Provider:   Carlyle Dolly, MD   Other Instructions

## 2019-07-14 NOTE — Progress Notes (Signed)
Clinical Summary [name] is a 69 y.o.female seen today for follow up of the following medical problems.   1. Palpitations - infrequent palpitations since last visit. Just a few seconds at a time, very mild   2. Preoperative evaluation - being considered for knee replacement - uses stationary bike, can ride up to 30 minutes at intervals.    SH: completed covid vaccine Works as Engineer, water, has phD.     Past Medical History:  Diagnosis Date  . Allergic rhinitis   . Anal fissure    Hx of   . Anxiety    hx of  . Arthritis   . Asthma    none in last year   . Depression    hx of  . Diabetes mellitus without complication (HCC)    no meds  . Diverticular disease   . Dysrhythmia    palpitations  . Endometrial cancer ([place]) 2001   spread to appendix   . Hemorrhoids   . Hyperglycemia   . Hypertension   . Hypothyroidism   . Obesity   . Sebaceous cyst   . Thyroid disease   . Thyroid nodule   . 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 Triple Antibiotic [Bacitracin-Neomycin-Polymyxin] Anaphylaxis  . Levofloxacin Other (See Comments)    Stomach upset  . Capsaicin Rash  . Elastic Bandages & [Zinc] Rash     Current Outpatient Medications  Medication Sig Dispense Refill  . albuterol (PROVENTIL HFA;VENTOLIN HFA) 108 (90 Base) MCG/ACT inhaler Inhale 2 puffs into the lungs every 6 (six) hours as needed for wheezing or shortness of breath.    . cetirizine (ZYRTEC) 10 MG tablet Take 10 mg by mouth daily as needed for allergies.    Marland Kitchen dicyclomine (BENTYL) 20 MG tablet Take 1 tablet (20 mg total) by mouth daily before breakfast. 90 tablet 3  . fluticasone (FLONASE) 50 MCG/ACT nasal spray Place 1 spray into both nostrils daily as needed for allergies or rhinitis.    Marland Kitchen levothyroxine (SYNTHROID, LEVOTHROID) 75 MCG tablet Take 75 mcg by mouth daily before breakfast.     . meloxicam (MOBIC) 7.5 MG tablet Take 7.5 mg by mouth daily.     . NON FORMULARY CBD oil - 15 -20 drops a day of the 500 mg.    . phentermine 37.5 MG capsule Take 37.5 mg by mouth. Patient takes 1/2 daily with breakfast.    . Probiotic Product (PROBIOTIC PO) Take by mouth. This is Raw Probiotic -  Takes 1 po daily.    . psyllium (METAMUCIL SMOOTH TEXTURE) 28 % packet Take 1 packet by mouth at bedtime. (Patient not taking: Reported on 04/11/2019)    . sertraline (ZOLOFT) 100 MG tablet Take 150 mg by mouth daily with breakfast.     . triamcinolone cream (KENALOG) 0.1 % Apply 1 application topically daily as needed (rash).    . TURMERIC CURCUMIN PO Take 2,000 mg by mouth daily.      No current facility-administered medications for this visit.     Past Surgical History:  Procedure Laterality Date  . APPENDECTOMY  2008  . BIOPSY  12/14/2016   Procedure: BIOPSY;  Surgeon: Rogene Houston, MD;  Location: [facility];  Service: Endoscopy;;  colon  . CHOLECYSTECTOMY  2008  . COLONOSCOPY N/A 11/24/2012   Procedure: COLONOSCOPY;  Surgeon: Rogene Houston, MD;  Location: [facility];  Service: Endoscopy;  Laterality: N/A;  830  . COLONOSCOPY N/A 12/14/2016   Procedure: COLONOSCOPY;  Surgeon: Rogene Houston, MD;  Location: [facility];  Service: Endoscopy;  Laterality: N/A;  12:00  . POLYPECTOMY  12/14/2016   Procedure: POLYPECTOMY;  Surgeon: Rogene Houston, MD;  Location: [facility];  Service: Endoscopy;;  colon  . ROBOTIC ASSITED PARTIAL NEPHRECTOMY Left 08/21/2016   Procedure: XI ROBOTIC ASSITED PARTIAL NEPHRECTOMY;  Surgeon: Ardis Hughs, MD;  Location: [facility];  Service: Urology;  Laterality: Left;  . TOTAL ABDOMINAL HYSTERECTOMY  2001  . WISDOM TOOTH EXTRACTION       Allergies  Allergen Reactions  . Triple Antibiotic [Bacitracin-Neomycin-Polymyxin] Anaphylaxis  . Levofloxacin Other (See Comments)    Stomach upset  . Capsaicin Rash  . Elastic Bandages & [Zinc] Rash      Family History  Problem Relation Age of Onset  . Breast  cancer Mother   . Diabetes Mother   . Obesity Sister   . Asthma Sister   . COPD Father        smoked  . Prostate cancer Father   . Breast cancer Maternal Aunt   . Colon cancer Neg Hx      Social History Melissa Velazquez reports that she quit smoking about 21 years ago. Her smoking use included cigarettes. She has a 20.00 pack-year smoking history. She has never used smokeless tobacco. Melissa Velazquez reports current alcohol use.   Review of Systems CONSTITUTIONAL: No weight loss, fever, chills, weakness or fatigue.  HEENT: Eyes: No visual loss, blurred vision, double vision or yellow sclerae.No hearing loss, sneezing, congestion, runny nose or sore throat.  SKIN: No rash or itching.  CARDIOVASCULAR: per hpi RESPIRATORY: No shortness of breath, cough or sputum.  GASTROINTESTINAL: No anorexia, nausea, vomiting or diarrhea. No abdominal pain or blood.  GENITOURINARY: No burning on urination, no polyuria NEUROLOGICAL: No headache, dizziness, syncope, paralysis, ataxia, numbness or tingling in the extremities. No change in bowel or bladder control.  MUSCULOSKELETAL: No muscle, back pain, joint pain or stiffness.  LYMPHATICS: No enlarged nodes. No history of splenectomy.  PSYCHIATRIC: No history of depression or anxiety.  ENDOCRINOLOGIC: No reports of sweating, cold or heat intolerance. No polyuria or polydipsia.  Marland Kitchen   Physical Examination Today's Vitals   07/14/19 0848  BP: 114/66  Pulse: 76  SpO2: 94%  Weight: 202 lb (91.6 kg)  Height: 5\' 4"  (1.626 m)   Body mass index is 34.67 kg/m.  Gen: resting comfortably, no acute distress HEENT: no scleral icterus, pupils equal round and reactive, no palptable cervical adenopathy,  CV: RRR, no m/r/g, no jvd Resp: Clear to auscultation bilaterally GI: abdomen is soft, non-tender, non-distended, normal bowel sounds, no hepatosplenomegaly MSK: extremities are warm, no edema.  Skin: warm, no rash Neuro:  no focal deficits Psych: appropriate  affect   Diagnostic Studies     Assessment and Plan  1. Palpitations - symptoms have significantly quieted down since October, 2020 - very infrequent mild symptoms at times, continue to monitor at this time - if progression would plan for outpatient monitor - of note she is off phentermine, will take off her list. Recent normal TSH with pcp  2. Preoperative evaluation - tolerates greater than 4 METs without troubles - recommend proceeding with knee surgery from a cardiac standpoint.     F/u 1 year  Arnoldo Lenis, M.D.

## 2019-07-21 DIAGNOSIS — F341 Dysthymic disorder: Secondary | ICD-10-CM | POA: Diagnosis not present

## 2019-08-04 DIAGNOSIS — F341 Dysthymic disorder: Secondary | ICD-10-CM | POA: Diagnosis not present

## 2019-08-18 DIAGNOSIS — E1122 Type 2 diabetes mellitus with diabetic chronic kidney disease: Secondary | ICD-10-CM | POA: Diagnosis not present

## 2019-08-18 DIAGNOSIS — N182 Chronic kidney disease, stage 2 (mild): Secondary | ICD-10-CM | POA: Diagnosis not present

## 2019-08-18 DIAGNOSIS — Z7984 Long term (current) use of oral hypoglycemic drugs: Secondary | ICD-10-CM | POA: Diagnosis not present

## 2019-08-18 DIAGNOSIS — I129 Hypertensive chronic kidney disease with stage 1 through stage 4 chronic kidney disease, or unspecified chronic kidney disease: Secondary | ICD-10-CM | POA: Diagnosis not present

## 2019-08-25 DIAGNOSIS — F341 Dysthymic disorder: Secondary | ICD-10-CM | POA: Diagnosis not present

## 2019-09-01 DIAGNOSIS — F341 Dysthymic disorder: Secondary | ICD-10-CM | POA: Diagnosis not present

## 2019-09-22 DIAGNOSIS — Z01810 Encounter for preprocedural cardiovascular examination: Secondary | ICD-10-CM | POA: Diagnosis not present

## 2019-09-22 DIAGNOSIS — E039 Hypothyroidism, unspecified: Secondary | ICD-10-CM | POA: Diagnosis not present

## 2019-09-22 DIAGNOSIS — M1991 Primary osteoarthritis, unspecified site: Secondary | ICD-10-CM | POA: Diagnosis not present

## 2019-09-22 DIAGNOSIS — Z13220 Encounter for screening for lipoid disorders: Secondary | ICD-10-CM | POA: Diagnosis not present

## 2019-09-22 DIAGNOSIS — Z6834 Body mass index (BMI) 34.0-34.9, adult: Secondary | ICD-10-CM | POA: Diagnosis not present

## 2019-09-22 DIAGNOSIS — K5730 Diverticulosis of large intestine without perforation or abscess without bleeding: Secondary | ICD-10-CM | POA: Diagnosis not present

## 2019-09-22 DIAGNOSIS — K589 Irritable bowel syndrome without diarrhea: Secondary | ICD-10-CM | POA: Diagnosis not present

## 2019-09-22 DIAGNOSIS — E6609 Other obesity due to excess calories: Secondary | ICD-10-CM | POA: Diagnosis not present

## 2019-09-22 DIAGNOSIS — I10 Essential (primary) hypertension: Secondary | ICD-10-CM | POA: Diagnosis not present

## 2019-09-22 DIAGNOSIS — F341 Dysthymic disorder: Secondary | ICD-10-CM | POA: Diagnosis not present

## 2019-09-29 DIAGNOSIS — F341 Dysthymic disorder: Secondary | ICD-10-CM | POA: Diagnosis not present

## 2019-10-06 DIAGNOSIS — F341 Dysthymic disorder: Secondary | ICD-10-CM | POA: Diagnosis not present

## 2019-10-19 DIAGNOSIS — E039 Hypothyroidism, unspecified: Secondary | ICD-10-CM | POA: Diagnosis not present

## 2019-10-19 DIAGNOSIS — M1991 Primary osteoarthritis, unspecified site: Secondary | ICD-10-CM | POA: Diagnosis not present

## 2019-10-19 DIAGNOSIS — I10 Essential (primary) hypertension: Secondary | ICD-10-CM | POA: Diagnosis not present

## 2019-10-20 DIAGNOSIS — F341 Dysthymic disorder: Secondary | ICD-10-CM | POA: Diagnosis not present

## 2019-10-20 DIAGNOSIS — M170 Bilateral primary osteoarthritis of knee: Secondary | ICD-10-CM | POA: Diagnosis not present

## 2019-10-21 DIAGNOSIS — Z23 Encounter for immunization: Secondary | ICD-10-CM | POA: Diagnosis not present

## 2019-10-27 DIAGNOSIS — F341 Dysthymic disorder: Secondary | ICD-10-CM | POA: Diagnosis not present

## 2019-11-03 DIAGNOSIS — F341 Dysthymic disorder: Secondary | ICD-10-CM | POA: Diagnosis not present

## 2019-11-09 ENCOUNTER — Other Ambulatory Visit (HOSPITAL_COMMUNITY): Payer: Self-pay | Admitting: Urology

## 2019-11-09 ENCOUNTER — Other Ambulatory Visit: Payer: Self-pay | Admitting: Urology

## 2019-11-09 DIAGNOSIS — C641 Malignant neoplasm of right kidney, except renal pelvis: Secondary | ICD-10-CM

## 2019-11-10 DIAGNOSIS — Z6834 Body mass index (BMI) 34.0-34.9, adult: Secondary | ICD-10-CM | POA: Diagnosis not present

## 2019-11-10 DIAGNOSIS — Z23 Encounter for immunization: Secondary | ICD-10-CM | POA: Diagnosis not present

## 2019-11-10 DIAGNOSIS — Z1331 Encounter for screening for depression: Secondary | ICD-10-CM | POA: Diagnosis not present

## 2019-11-10 DIAGNOSIS — F341 Dysthymic disorder: Secondary | ICD-10-CM | POA: Diagnosis not present

## 2019-11-10 DIAGNOSIS — E6609 Other obesity due to excess calories: Secondary | ICD-10-CM | POA: Diagnosis not present

## 2019-11-10 DIAGNOSIS — Z0001 Encounter for general adult medical examination with abnormal findings: Secondary | ICD-10-CM | POA: Diagnosis not present

## 2019-11-17 DIAGNOSIS — Z6834 Body mass index (BMI) 34.0-34.9, adult: Secondary | ICD-10-CM | POA: Diagnosis not present

## 2019-11-17 DIAGNOSIS — Z23 Encounter for immunization: Secondary | ICD-10-CM | POA: Diagnosis not present

## 2019-11-17 DIAGNOSIS — K589 Irritable bowel syndrome without diarrhea: Secondary | ICD-10-CM | POA: Diagnosis not present

## 2019-11-17 DIAGNOSIS — F341 Dysthymic disorder: Secondary | ICD-10-CM | POA: Diagnosis not present

## 2019-11-17 DIAGNOSIS — E6609 Other obesity due to excess calories: Secondary | ICD-10-CM | POA: Diagnosis not present

## 2019-11-18 DIAGNOSIS — E039 Hypothyroidism, unspecified: Secondary | ICD-10-CM | POA: Diagnosis not present

## 2019-11-18 DIAGNOSIS — I10 Essential (primary) hypertension: Secondary | ICD-10-CM | POA: Diagnosis not present

## 2019-11-18 DIAGNOSIS — M1991 Primary osteoarthritis, unspecified site: Secondary | ICD-10-CM | POA: Diagnosis not present

## 2019-11-24 DIAGNOSIS — F341 Dysthymic disorder: Secondary | ICD-10-CM | POA: Diagnosis not present

## 2019-12-01 DIAGNOSIS — F341 Dysthymic disorder: Secondary | ICD-10-CM | POA: Diagnosis not present

## 2019-12-04 ENCOUNTER — Ambulatory Visit (HOSPITAL_COMMUNITY): Payer: Medicare Other

## 2019-12-06 ENCOUNTER — Other Ambulatory Visit: Payer: Self-pay

## 2019-12-06 ENCOUNTER — Ambulatory Visit (HOSPITAL_COMMUNITY)
Admission: RE | Admit: 2019-12-06 | Discharge: 2019-12-06 | Disposition: A | Discharge status: Home / Self Care | Payer: Medicare Other | Source: Ambulatory Visit | Attending: Urology | Admitting: Urology

## 2019-12-06 DIAGNOSIS — C641 Malignant neoplasm of right kidney, except renal pelvis: Secondary | ICD-10-CM | POA: Insufficient documentation

## 2019-12-06 DIAGNOSIS — K5730 Diverticulosis of large intestine without perforation or abscess without bleeding: Secondary | ICD-10-CM | POA: Diagnosis not present

## 2019-12-06 DIAGNOSIS — C642 Malignant neoplasm of left kidney, except renal pelvis: Secondary | ICD-10-CM | POA: Diagnosis not present

## 2019-12-06 DIAGNOSIS — Z905 Acquired absence of kidney: Secondary | ICD-10-CM | POA: Diagnosis not present

## 2019-12-06 DIAGNOSIS — Z90710 Acquired absence of both cervix and uterus: Secondary | ICD-10-CM | POA: Diagnosis not present

## 2019-12-06 LAB — POCT I-STAT CREATININE: Creatinine, Ser: 1 mg/dL (ref 0.44–1.00)

## 2019-12-06 MED ORDER — IOHEXOL 300 MG/ML  SOLN
100.0000 mL | Freq: Once | INTRAMUSCULAR | Status: AC | PRN
  Administered 2019-12-06: 100 mL via INTRAVENOUS

## 2019-12-08 DIAGNOSIS — F341 Dysthymic disorder: Secondary | ICD-10-CM | POA: Diagnosis not present

## 2019-12-13 DIAGNOSIS — M25561 Pain in right knee: Secondary | ICD-10-CM | POA: Diagnosis not present

## 2019-12-13 DIAGNOSIS — M25662 Stiffness of left knee, not elsewhere classified: Secondary | ICD-10-CM | POA: Diagnosis not present

## 2019-12-13 DIAGNOSIS — M25562 Pain in left knee: Secondary | ICD-10-CM | POA: Diagnosis not present

## 2019-12-13 DIAGNOSIS — M170 Bilateral primary osteoarthritis of knee: Secondary | ICD-10-CM | POA: Diagnosis not present

## 2019-12-13 DIAGNOSIS — M25661 Stiffness of right knee, not elsewhere classified: Secondary | ICD-10-CM | POA: Diagnosis not present

## 2019-12-18 DIAGNOSIS — C642 Malignant neoplasm of left kidney, except renal pelvis: Secondary | ICD-10-CM | POA: Diagnosis not present

## 2019-12-19 DIAGNOSIS — M1991 Primary osteoarthritis, unspecified site: Secondary | ICD-10-CM | POA: Diagnosis not present

## 2019-12-19 DIAGNOSIS — I10 Essential (primary) hypertension: Secondary | ICD-10-CM | POA: Diagnosis not present

## 2019-12-19 DIAGNOSIS — E039 Hypothyroidism, unspecified: Secondary | ICD-10-CM | POA: Diagnosis not present

## 2019-12-22 ENCOUNTER — Encounter: Payer: Self-pay | Admitting: Allergy & Immunology

## 2019-12-22 ENCOUNTER — Other Ambulatory Visit: Payer: Self-pay

## 2019-12-22 ENCOUNTER — Ambulatory Visit (INDEPENDENT_AMBULATORY_CARE_PROVIDER_SITE_OTHER): Payer: Medicare Other | Admitting: Allergy & Immunology

## 2019-12-22 VITALS — BP 132/80 | HR 72 | Resp 17 | Ht 64.5 in | Wt 196.6 lb

## 2019-12-22 DIAGNOSIS — L239 Allergic contact dermatitis, unspecified cause: Secondary | ICD-10-CM | POA: Diagnosis not present

## 2019-12-22 DIAGNOSIS — J3089 Other allergic rhinitis: Secondary | ICD-10-CM

## 2019-12-22 DIAGNOSIS — J302 Other seasonal allergic rhinitis: Secondary | ICD-10-CM

## 2019-12-22 DIAGNOSIS — J4520 Mild intermittent asthma, uncomplicated: Secondary | ICD-10-CM

## 2019-12-22 DIAGNOSIS — F341 Dysthymic disorder: Secondary | ICD-10-CM | POA: Diagnosis not present

## 2019-12-22 MED ORDER — AZELASTINE HCL 0.1 % NA SOLN: 1.0000 | Freq: Two times a day (BID) | NASAL | 5 refills | Status: DC | PRN

## 2019-12-22 MED ORDER — LEVOCETIRIZINE DIHYDROCHLORIDE 5 MG PO TABS: 5.0000 mg | ORAL_TABLET | Freq: Every evening | ORAL | 5 refills | Status: DC

## 2019-12-22 MED ORDER — FLUTICASONE PROPIONATE 50 MCG/ACT NA SUSP: 1.0000 | Freq: Two times a day (BID) | NASAL | 5 refills | Status: DC | PRN

## 2019-12-22 NOTE — Patient Instructions (Addendum)
1. Chronic rhinitis - Testing today showed: grasses, ragweed, weeds, trees, indoor molds, outdoor molds, dust mites and dog - Copy of test results provided.  - Avoidance measures provided. - Start taking: Xyzal (levocetirizine) 5mg  tablet once daily and Dymista (fluticasone/azelastine) two sprays per nostril 1-2 times daily as needed - You can use an extra dose of the antihistamine, if needed, for breakthrough symptoms.  - Consider nasal saline rinses 1-2 times daily to remove allergens from the nasal cavities as well as help with mucous clearance (this is especially helpful to do before the nasal sprays are given) - Consider allergy shots as a means of long-term control. - Allergy shots "re-train" and "reset" the immune system to ignore environmental allergens and decrease the resulting immune response to those allergens (sneezing, itchy watery eyes, runny nose, nasal congestion, etc).    - Allergy shots improve symptoms in 75-85% of patients.  - We can discuss more at the next appointment if the medications are not working for you.  2. Allergic contact dermatitis - We are going to have you come to [facility] on Monday and we can read the testing for metals and chemicals on a Wednesday and Friday in [place] (9am on Monday, Wednesday, and Friday).  - We can figure out sensitivities at that point.  3. Mild intermittent asthma, uncomplicated - We did not do lung testing since you seem to have a good handle on your symptoms with albuterol alone. - It does not seem that there is a need for a controller medication at this point in time.   4. Return in about 3 days (around 12/25/2019).    Please inform us of any Emergency Department visits, hospitalizations, or changes in symptoms. Call us before going to the ED for breathing or allergy symptoms since we might be able to fit you in for a sick visit. Feel free to contact us anytime with any questions, problems, or concerns.  It was a pleasure to  meet you today!  Websites that have reliable patient information: 1. American Academy of Asthma, Allergy, and Immunology: www.aaaai.org 2. Food Allergy Research and Education (FARE): foodallergy.org 3. Mothers of Asthmatics: http://www.asthmacommunitynetwork.org 4. American College of Allergy, Asthma, and Immunology: www.acaai.org   COVID-19 Vaccine Information can be found at: ShippingScam.co.uk For questions related to vaccine distribution or appointments, please email [email or web address] or call [phone].     Like [place] on [place] and Instagram for our latest updates!     HAPPY FALL!     Make sure you are registered to vote! If you have moved or changed any of your contact information, you will need to get this updated before voting!  In some cases, you MAY be able to register to vote online: [email or web address]

## 2019-12-22 NOTE — Progress Notes (Signed)
NEW PATIENT  Date of Service/Encounter:  12/22/19  Referring provider: [name], MD   Assessment:   Seasonal and perennial allergic rhinitis (grasses, ragweed, weeds, trees, indoor molds, outdoor molds, dust mites and dog)  Allergic contact dermatitis - needs patch testing (TRUE Test + metals)  Mild intermittent asthma, uncomplicated  Plan/Recommendations:   1. Chronic rhinitis - Testing today showed: grasses, ragweed, weeds, trees, indoor molds, outdoor molds, dust mites and dog - Copy of test results provided.  - Avoidance measures provided. - Start taking: Xyzal (levocetirizine) 5mg  tablet once daily and Dymista (fluticasone/azelastine) two sprays per nostril 1-2 times daily as needed - You can use an extra dose of the antihistamine, if needed, for breakthrough symptoms.  - Consider nasal saline rinses 1-2 times daily to remove allergens from the nasal cavities as well as help with mucous clearance (this is especially helpful to do before the nasal sprays are given) - Consider allergy shots as a means of long-term control. - Allergy shots "re-train" and "reset" the immune system to ignore environmental allergens and decrease the resulting immune response to those allergens (sneezing, itchy watery eyes, runny nose, nasal congestion, etc).    - Allergy shots improve symptoms in 75-85% of patients.  - We can discuss more at the next appointment if the medications are not working for you.  2. Allergic contact dermatitis - We are going to have you come to [facility] on Monday and we can read the testing for metals and chemicals on a Wednesday and Friday in [place] (9am on Monday, Wednesday, and Friday).  - We can figure out sensitivities at that point.  3. Mild intermittent asthma, uncomplicated - We did not do lung testing since you seem to have a good handle on your symptoms with albuterol alone. - It does not seem that there is a need for a controller medication at  this point in time.   4. Return in about 3 days (around 12/25/2019).   Subjective:   Melissa Velazquez is a 69 y.o. female presenting today for evaluation of  Chief Complaint  Patient presents with  . Allergy Testing    Melissa Velazquez has a history of the following: Patient Active Problem List   Diagnosis Date Noted  . Seasonal and perennial allergic rhinitis 12/24/2019  . Allergic contact dermatitis 12/24/2019  . Mild intermittent asthma, uncomplicated 60/45/4098  . IBS (irritable bowel syndrome) 04/11/2019  . History of colonic polyps 11/25/2016  . Renal mass 08/21/2016  . Upper airway cough syndrome 07/06/2014  . Essential hypertension 07/06/2014  . Personal history of colonic polyps 09/17/2010  . BACK PAIN 09/14/2008  . HIRSUTISM 06/15/2008  . GLUCOSE INTOLERANCE 01/09/2008  . NEOPLASM, MALIGNANT, UTERUS 12/27/2006  . THYROID NODULE 12/27/2006  . DIVERTICULAR DISEASE 06/03/2006  . OBESITY NOS 03/09/2006  . DEPRESSION 03/09/2006  . ALLERGIC RHINITIS 03/09/2006  . ASTHMA 03/09/2006  . GERD 03/09/2006  . ELEVATED BLOOD PRESSURE WITHOUT DIAGNOSIS OF HYPERTENSION 03/09/2006    History obtained from: chart review and patient.  Melissa Velazquez was referred by [name], MD.     Melissa Velazquez is a 69 y.o. female presenting for an evaluation of possible metal allergies as well as asthma and environmental allergies.   She is having a knee replacement surgery. This is schedule for January 5th . She is having bilateral knee replacement. She has reacted to earrings in the past. She seems to figure out by trial and error. She currently wears them every other day.  She does wear a ring. She has a gold ring. She has never had an issue with belt buckles or pants or anything. She estimates that she was around her 33s or so.    She does have contact dermatitis to soaps as well. She goes to [employer] for her soaps. She has a history of fairly severe eczema as well. She uses Ms. Meyer's  lotion and Cerve. She does use topical steroids around twice daily. This mostly is her hands. She uses triamcinolone, but she goes through one around every year at the most. She would like testing to chemicals and fragrances and whatnot as well.   Asthma/Respiratory Symptom History: She has a history of asthma, but uses only albuterol as needed.  She has never needed systemic steroids at all.  He has never been on a daily controller medication.  Allergic Rhinitis Symptom History: She does have itchy eyes. She has been tested for environmental allergies around 15-20 years ago. She was positive to "everything". She was never on shots for these symptoms. At some point, she was placed on Flonase, which she does not use routinely at this point. She was on Astelin at some point.   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 She see people for disability evaluations.    Otherwise, there is no history of other atopic diseases, including food allergies, drug allergies, stinging insect allergies, eczema or urticaria. There is no significant infectious history. Vaccinations are up to date.    Past Medical History: Patient Active Problem List   Diagnosis Date Noted  . Seasonal and perennial allergic rhinitis 12/24/2019  . Allergic contact dermatitis 12/24/2019  . Mild intermittent asthma, uncomplicated 34/19/6222  . IBS (irritable bowel syndrome) 04/11/2019  . History of colonic polyps 11/25/2016  . Renal mass 08/21/2016  . Upper airway cough syndrome 07/06/2014  . Essential hypertension 07/06/2014  . Personal history of colonic polyps 09/17/2010  . BACK PAIN 09/14/2008  . HIRSUTISM 06/15/2008  . GLUCOSE INTOLERANCE 01/09/2008  . NEOPLASM, MALIGNANT, UTERUS 12/27/2006  . THYROID NODULE 12/27/2006  . DIVERTICULAR DISEASE 06/03/2006  . OBESITY NOS 03/09/2006  . DEPRESSION 03/09/2006  . ALLERGIC RHINITIS 03/09/2006  . ASTHMA 03/09/2006  . GERD 03/09/2006  . ELEVATED BLOOD  PRESSURE WITHOUT DIAGNOSIS OF HYPERTENSION 03/09/2006    Medication List:  Allergies as of 12/22/2019      Reactions   Triple Antibiotic [bacitracin-neomycin-polymyxin] Anaphylaxis   Levofloxacin Other (See Comments)   Stomach upset   Capsaicin Rash   Elastic Bandages & [zinc] Rash      Medication List       Accurate as of December 22, 2019 11:59 PM. If you have any questions, ask your nurse or doctor.        STOP taking these medications   dicyclomine 20 MG tablet Commonly known as: Bentyl Stopped by: Valentina Shaggy, MD   NON FORMULARY Stopped by: Valentina Shaggy, MD   PROBIOTIC PO Stopped by: Valentina Shaggy, MD   psyllium 28 % packet Commonly known as: Metamucil Smooth Texture Stopped by: Valentina Shaggy, MD   TURMERIC CURCUMIN PO Stopped by: Valentina Shaggy, MD     TAKE these medications   albuterol 108 (90 Base) MCG/ACT inhaler Commonly known as: VENTOLIN HFA Inhale 2 puffs into the lungs every 6 (six) hours as needed for wheezing or shortness of breath.   azelastine 0.1 % nasal spray Commonly known as: ASTELIN Place 1 spray into both  nostrils 2 (two) times daily as needed for rhinitis. Started by: Valentina Shaggy, MD   cetirizine 10 MG tablet Commonly known as: ZYRTEC Take 10 mg by mouth daily as needed for allergies.   fluticasone 50 MCG/ACT nasal spray Commonly known as: FLONASE Place 1 spray into both nostrils 2 (two) times daily as needed for allergies or rhinitis. What changed: when to take this Changed by: Valentina Shaggy, MD   levocetirizine 5 MG tablet Commonly known as: XYZAL Take 1 tablet (5 mg total) by mouth every evening. Started by: Valentina Shaggy, MD   levothyroxine 75 MCG tablet Commonly known as: SYNTHROID Take 75 mcg by mouth daily before breakfast.   meloxicam 7.5 MG tablet Commonly known as: MOBIC Take 7.5 mg by mouth daily.   nystatin 100000 UNIT/ML suspension Commonly known as:  MYCOSTATIN nystatin 100,000 unit/mL oral suspension  SHAKE LIQUID AND TAKE 5 MLS THREE TIMES DAILY FOR 7 DAYS AS NEEDED   sertraline 100 MG tablet Commonly known as: ZOLOFT Take 150 mg by mouth daily with breakfast.   triamcinolone 0.1 % Commonly known as: KENALOG Apply 1 application topically daily as needed (rash). What changed: Another medication with the same name was removed. Continue taking this medication, and follow the directions you see here. Changed by: Valentina Shaggy, MD       Birth History: non-contributory  Developmental History: non-contributory  Past Surgical History: Past Surgical History:  Procedure Laterality Date  . APPENDECTOMY  2008  . BIOPSY  12/14/2016   Procedure: BIOPSY;  Surgeon: Rogene Houston, MD;  [facility];  Service: Endoscopy;;  colon  . CHOLECYSTECTOMY  2008  . COLONOSCOPY N/A 11/24/2012   Procedure: COLONOSCOPY;  Surgeon: Rogene Houston, MD;  [facility]: [facility];  Service: Endoscopy;  Laterality: N/A;  830  . COLONOSCOPY N/A 12/14/2016   Procedure: COLONOSCOPY;  Surgeon: Rogene Houston, MD;  [facility];  Service: Endoscopy;  Laterality: N/A;  12:00  . POLYPECTOMY  12/14/2016   Procedure: POLYPECTOMY;  Surgeon: Rogene Houston, MD;  Location: [facility];  Service: Endoscopy;;  colon  . ROBOTIC ASSITED PARTIAL NEPHRECTOMY Left 08/21/2016   Procedure: XI ROBOTIC ASSITED PARTIAL NEPHRECTOMY;  Surgeon: Ardis Hughs, MD;  Location: [facility];  Service: Urology;  Laterality: Left;  . TOTAL ABDOMINAL HYSTERECTOMY  2001  . WISDOM TOOTH EXTRACTION       Family History: Family History  Problem Relation Age of Onset  . Breast cancer Mother   . Diabetes Mother   . Obesity Sister   . Asthma Sister   . Eczema Sister   . COPD Father        smoked  . Prostate cancer Father   . Breast cancer Maternal Aunt   . Urticaria Maternal Grandfather   . Colon cancer Neg Hx      Social History: Lory lives  at home in a house that is 68 years old.  There are wood and rugs throughout the home.  They have electric heating and heat pump for cooling.  There is a dog inside of the home as well as the bedroom.  There are dust mite covers on the bedding.  There is no tobacco exposure.  She is not exposed to fumes, chemicals, or dust.  She does not have any hobbies that expose her to these chemicals.  There is a HEPA filter in the home.  She does not live near an interstate or industrial  area.    Review of Systems  Constitutional: Negative.  Negative for fever, malaise/fatigue and weight loss.  HENT: Positive for congestion. Negative for ear discharge and ear pain.   Eyes: Negative for pain, discharge and redness.  Respiratory: Negative for cough, sputum production, shortness of breath and wheezing.   Cardiovascular: Negative.  Negative for chest pain and palpitations.  Gastrointestinal: Negative for abdominal pain, heartburn, nausea and vomiting.  Skin: Positive for rash. Negative for itching.  Neurological: Negative for dizziness and headaches.  Endo/Heme/Allergies: Negative for environmental allergies. Does not bruise/bleed easily.       Objective:   Blood pressure 132/80, pulse 72, resp. rate 17, height 5' 4.5" (1.638 m), weight 196 lb 9.6 oz (89.2 kg), SpO2 96 %. Body mass index is 33.23 kg/m.   Physical Exam:   Physical Exam Constitutional:      Appearance: She is well-developed.     Comments: Talkative female.  Very pleasant.  HENT:     Head: Normocephalic and atraumatic.     Right Ear: Tympanic membrane, ear canal and external ear normal. No drainage, swelling or tenderness. Tympanic membrane is not injected, scarred, erythematous, retracted or bulging.     Left Ear: Tympanic membrane, ear canal and external ear normal. No drainage, swelling or tenderness. Tympanic membrane is not injected, scarred, erythematous, retracted or bulging.     Nose: No nasal deformity, septal deviation,  mucosal edema or rhinorrhea.     Right Turbinates: Enlarged and swollen.     Left Turbinates: Enlarged and swollen.     Right Sinus: No maxillary sinus tenderness or frontal sinus tenderness.     Left Sinus: No maxillary sinus tenderness or frontal sinus tenderness.     Comments: Tonsils unremarkable.    Mouth/Throat:     Mouth: Mucous membranes are not pale and not dry.     Pharynx: Uvula midline.  Eyes:     General: Allergic shiner present.        Right eye: No discharge.        Left eye: No discharge.     Conjunctiva/sclera: Conjunctivae normal.     Right eye: Right conjunctiva is not injected. No chemosis.    Left eye: Left conjunctiva is not injected. No chemosis.    Pupils: Pupils are equal, round, and reactive to light.  Cardiovascular:     Rate and Rhythm: Normal rate and regular rhythm.     Heart sounds: Normal heart sounds.  Pulmonary:     Effort: Pulmonary effort is normal. No tachypnea, accessory muscle usage or respiratory distress.     Breath sounds: Normal breath sounds. No wheezing, rhonchi or rales.     Comments: No wheezes or crackles. Chest:     Chest wall: No tenderness.  Abdominal:     Tenderness: There is no abdominal tenderness. There is no guarding or rebound.  Lymphadenopathy:     Head:     Right side of head: No submandibular, tonsillar or occipital adenopathy.     Left side of head: No submandibular, tonsillar or occipital adenopathy.     Cervical: No cervical adenopathy.  Skin:    Coloration: Skin is not pale.     Findings: No abrasion, erythema, petechiae or rash. Rash is not papular, urticarial or vesicular.     Comments: No skin issues appreciated.  Neurological:     Mental Status: She is alert.      Diagnostic studies:   Allergy Studies:     Airborne Adult Perc -  12/22/19 1104    Time Antigen Placed 1030    Allergen Manufacturer Lavella Hammock    Location Back    Panel 1 Select    1. Control-Buffer 50% Glycerol Negative    2.  Control-Histamine 1 mg/ml 2+    3. Albumin saline Negative    4. [place]by 3+    5. [place] 3+    6. Johnson 3+    7. Kentucky Blue 3+    8. Meadow Fescue 3+    9. Perennial Rye 3+    10. Sweet Vernal 3+    11. Timothy 3+    12. Cocklebur Negative    13. Burweed Marshelder Negative    14. Ragweed, short 2+    15. Ragweed, Giant Negative    16. Plantain,  English 2+    17. Lamb's Quarters Negative    18. Sheep Sorrell Negative    19. Rough Pigweed Negative    20. Marsh Elder, Rough Negative    21. Mugwort, Common Negative    22. Ash mix Negative    23. Birch mix Negative    24. Beech American Negative    25. Box, Elder Negative    26. Cedar, red Negative    27. Cottonwood, [place] Negative    28. Elm mix Negative    29. Hickory Negative    30. Maple mix Negative    31. Oak, [place] mix Negative    32. Pecan Pollen Negative    33. Pine mix Negative    34. Sycamore Eastern Negative    35. Woolstock, Black Pollen Negative    36. Alternaria alternata Negative    37. Cladosporium Herbarum Negative    38. Aspergillus mix Negative    39. Penicillium mix Negative    40. Bipolaris sorokiniana (Helminthosporium) Negative    41. Drechslera spicifera (Curvularia) Negative    42. Mucor plumbeus Negative    43. Fusarium moniliforme Negative    44. Aureobasidium pullulans (pullulara) Negative    45. Rhizopus oryzae Negative    46. Botrytis cinera Negative    47. Epicoccum nigrum Negative    48. Phoma betae Negative    49. Candida Albicans Negative    50. Trichophyton mentagrophytes Negative    51. Mite, D Farinae  5,000 AU/ml Negative    52. Mite, D Pteronyssinus  5,000 AU/ml Negative    53. Cat Hair 10,000 BAU/ml Negative    54.  Dog Epithelia Negative    55. Mixed Feathers Negative    56. Horse Epithelia Negative    57. Cockroach, German Negative    58. Mouse Negative    59. Tobacco Leaf Negative          Intradermal - 12/22/19 1103    Time Antigen Placed 1053    Allergen  Manufacturer Lavella Hammock    Location Arm    Intradermal Select    Control Negative    Tree mix 3+    Mold 1 1+    Mold 2 2+    Mold 3 Negative    Mold 4 1+    Cat Negative    Dog 3+    Cockroach Negative    Mite mix 2+           Allergy testing results were read and interpreted by myself, documented by clinical staff.         Salvatore Marvel, MD [facility] [place] [facility] [place]

## 2019-12-24 ENCOUNTER — Encounter: Payer: Self-pay | Admitting: Allergy & Immunology

## 2019-12-24 DIAGNOSIS — J302 Other seasonal allergic rhinitis: Secondary | ICD-10-CM | POA: Insufficient documentation

## 2019-12-24 DIAGNOSIS — L239 Allergic contact dermatitis, unspecified cause: Secondary | ICD-10-CM | POA: Insufficient documentation

## 2019-12-24 DIAGNOSIS — J4520 Mild intermittent asthma, uncomplicated: Secondary | ICD-10-CM | POA: Insufficient documentation

## 2019-12-24 DIAGNOSIS — J3089 Other allergic rhinitis: Secondary | ICD-10-CM | POA: Insufficient documentation

## 2019-12-25 ENCOUNTER — Ambulatory Visit (INDEPENDENT_AMBULATORY_CARE_PROVIDER_SITE_OTHER): Payer: Medicare Other | Admitting: Family

## 2019-12-25 ENCOUNTER — Encounter: Payer: Self-pay | Admitting: Family

## 2019-12-25 ENCOUNTER — Other Ambulatory Visit: Payer: Self-pay

## 2019-12-25 DIAGNOSIS — L239 Allergic contact dermatitis, unspecified cause: Secondary | ICD-10-CM

## 2019-12-25 NOTE — Progress Notes (Signed)
Follow-up Note  RE: CASEY MAXFIELD MRN: [id number] DOB: 03-17-1950 Date of Office Visit: 12/25/2019  Primary care provider: [name], MD Referring provider: [name], MD   Roseline returns to the office today for the patch test placement, given suspected history of contact dermatitis. She reports gastrointestinal symptoms with triple antibiotic (bacitracin-neomycin-polymixin) She reports that she thinks that she has had bacitracin since with no problems, but is not completely sure. She would like this taken out of her patch test.   Diagnostics: True Test patches and metal patches placed.    Plan:   Allergic contact dermatitis - Instructions provided on care of the patches for the next 48 hours. Stefana Lodico was instructed to avoid showering for the next 48 hours. Emrys Mceachron will follow up in 48 hours and 96 hours for patch readings.  Thank you for the opportunity to care for [place].  Please do not hesitate to contact us with questions.  Althea Charon, FNP [facility] [place] [facility] [place]

## 2019-12-25 NOTE — Addendum Note (Signed)
Addended by: Valere Dross on: 12/25/2019 09:18 AM   Modules accepted: Orders

## 2019-12-27 ENCOUNTER — Encounter: Payer: Self-pay | Admitting: Family

## 2019-12-27 ENCOUNTER — Other Ambulatory Visit: Payer: Self-pay

## 2019-12-27 ENCOUNTER — Ambulatory Visit: Payer: Medicare Other | Admitting: Family

## 2019-12-27 DIAGNOSIS — L239 Allergic contact dermatitis, unspecified cause: Secondary | ICD-10-CM

## 2019-12-27 NOTE — Progress Notes (Signed)
Melissa Velazquez returns to the office today for the final patch test interpretation, given suspected history of contact dermatitis.    Diagnostics:   TRUE TEST 48-hour hour reading: Strong positive reaction to: Nickel Sulfate and weak positive reaction to: Fragrance Mix, p-tert Butylphenol Formaldehyde Resin, Epoxy Resin, and Carba Mix  Plan:   Allergic contact dermatitis - The patient has been provided detailed information regarding the substances she is sensitive to, as well as products containing the substances.   - Meticulous avoidance of these substances is recommended.  - If avoidance is not possible, the use of barrier creams or lotions is recommended. - If symptoms persist or progress despite meticulous avoidance of Nickel Sulfate, Fragrance Mix, p-tert Butylphenol Formaldehyde Resin, Epoxy Resin, and Carba Mix , Dermatology Referral may be warranted.   Thank you for the opportunity to care for Melissa Velazquez.  Please do not hesitate to contact us with any questions.  Althea Charon, FNP [facility] [place]

## 2019-12-28 NOTE — Addendum Note (Signed)
Addended by: Clovis Cao A on: 12/28/2019 03:52 PM   Modules accepted: Orders

## 2019-12-29 ENCOUNTER — Ambulatory Visit (INDEPENDENT_AMBULATORY_CARE_PROVIDER_SITE_OTHER): Payer: Medicare Other | Admitting: Allergy & Immunology

## 2019-12-29 ENCOUNTER — Encounter: Payer: Self-pay | Admitting: Allergy & Immunology

## 2019-12-29 ENCOUNTER — Other Ambulatory Visit: Payer: Self-pay

## 2019-12-29 DIAGNOSIS — L239 Allergic contact dermatitis, unspecified cause: Secondary | ICD-10-CM | POA: Diagnosis not present

## 2019-12-29 DIAGNOSIS — J302 Other seasonal allergic rhinitis: Secondary | ICD-10-CM

## 2019-12-29 DIAGNOSIS — J3089 Other allergic rhinitis: Secondary | ICD-10-CM

## 2019-12-29 NOTE — Progress Notes (Signed)
    Follow-up Note  RE: DANNY ZIMNY MRN: [id number] DOB: [birth year] Date of Office Visit: 12/29/2019  Primary care provider: [name], MD Referring provider: [name], MD   Ilani returns to the office today for the final patch test interpretation, given suspected history of contact dermatitis.    Diagnostics:   TRUE TEST 96-hour reading: 2+ reaction to #1 (Nickel Sulfate), 1+ reaction to  #6 (Fragrance mix) and 1+ reaction to #23 (Thiomersal). All of the additional metal series were negative.   48 hour positives: p-tert Butylphenol, Formaldehyde Resin, Epoxy Resin, and Carba Mix  Plan:   Allergic contact dermatitis - The patient has been provided detailed information regarding the substances she is sensitive to, as well as products containing the substances.   - Meticulous avoidance of these substances is recommended.  - If avoidance is not possible, the use of barrier creams or lotions is recommended. - If symptoms persist or progress despite meticulous avoidance of the above chemicals, Dermatology Referral may be warranted.  We also discussed her allergic rhinitis.  She was wondering if she needed allergy shots.  I told her that was completely her decision. I did tell her that we would not need to worry about that until after her knee surgery and the recovery period. We will check in three months. In the meantime, we are going to continue with the nasal sprays and add on a HEPA filter in her bedroom.   Salvatore Marvel, MD  [facility] and [facility] [place] [facility] [place]

## 2020-01-17 NOTE — Patient Instructions (Addendum)
DUE TO COVID-19 ONLY ONE VISITOR IS ALLOWED TO COME WITH YOU AND STAY IN THE WAITING ROOM ONLY DURING PRE OP AND PROCEDURE DAY OF SURGERY. THE 1 VISITOR  MAY VISIT WITH YOU AFTER SURGERY IN YOUR PRIVATE ROOM DURING VISITING HOURS ONLY!  YOU NEED TO HAVE A COVID 19 TEST ON: 01/22/20 @  1:00 PM , THIS TEST MUST BE DONE BEFORE SURGERY,  COVID TESTING SITE [place] [address], IT IS ON THE RIGHT GOING OUT [address] APPROXIMATELY  2 MINUTES PAST [employer] ON THE RIGHT. ONCE YOUR COVID TEST IS COMPLETED,  PLEASE BEGIN THE QUARANTINE INSTRUCTIONS AS OUTLINED IN YOUR HANDOUT.                Melissa Velazquez   Your procedure is scheduled on: 01/24/20   Report to [facility] Main  Entrance   Report to admitting at: 10:20 AM     Call this number if you have problems the morning of surgery [phone]    Remember:   NO SOLID FOOD AFTER MIDNIGHT THE NIGHT PRIOR TO SURGERY. NOTHING BY MOUTH EXCEPT CLEAR LIQUIDS UNTIL: 9:50 AM . PLEASE FINISH GATORADE DRINK PER SURGEON ORDER  WHICH NEEDS TO BE COMPLETED AT: 9:50 AM .  CLEAR LIQUID DIET   Foods Allowed                                                                     Foods Excluded  Coffee and tea, regular and decaf                             liquids that you cannot  Plain Jell-O any favor except red or purple                                           see through such as: Fruit ices (not with fruit pulp)                                     milk, soups, orange juice  Iced Popsicles                                    All solid food Carbonated beverages, regular and diet                                    Cranberry, grape and apple juices Sports drinks like Gatorade Lightly seasoned clear broth or consume(fat free) Sugar, honey syrup  Sample Menu Breakfast                                Lunch  Supper Cranberry juice                    Beef broth                             Chicken broth Jell-O                                     Grape juice                           Apple juice Coffee or tea                        Jell-O                                      Popsicle                                                Coffee or tea                        Coffee or tea  _____________________________________________________________________  BRUSH YOUR TEETH MORNING OF SURGERY AND RINSE YOUR MOUTH OUT, NO CHEWING GUM CANDY OR MINTS.    Take these medicines the morning of surgery with A SIP OF WATER: levothyroxine,sertraline.Use Flonase,cetirizine and inhalers as usual.                               You may not have any metal on your body including hair pins and              piercings  Do not wear jewelry, make-up, lotions, powders or perfumes, deodorant             Do not wear nail polish on your fingernails.  Do not shave  48 hours prior to surgery.          Do not bring valuables to the hospital. [facility] IS NOT             RESPONSIBLE   FOR VALUABLES.  Contacts, dentures or bridgework may not be worn into surgery.  Leave suitcase in the car. After surgery it may be brought to your room.     Patients discharged the day of surgery will not be allowed to drive home. IF YOU ARE HAVING SURGERY AND GOING HOME THE SAME DAY, YOU MUST HAVE AN ADULT TO DRIVE YOU HOME AND BE WITH YOU FOR 24 HOURS. YOU MAY GO HOME BY TAXI OR UBER OR ORTHERWISE, BUT AN ADULT MUST ACCOMPANY YOU HOME AND STAY WITH YOU FOR 24 HOURS.  Name and phone number of your driver:  Special Instructions: N/A              Please read over the following fact sheets you were given: _____________________________________________________________________         [facility] - Preparing for Surgery Before surgery, you can play an important role.  Because skin is not sterile, your skin needs to  be as free of germs as possible.  You can reduce the number of germs on your skin by washing with CHG  (chlorahexidine gluconate) soap before surgery.  CHG is an antiseptic cleaner which kills germs and bonds with the skin to continue killing germs even after washing. Please DO NOT use if you have an allergy to CHG or antibacterial soaps.  If your skin becomes reddened/irritated stop using the CHG and inform your nurse when you arrive at Short Stay. Do not shave (including legs and underarms) for at least 48 hours prior to the first CHG shower.  You may shave your face/neck. Please follow these instructions carefully:  1.  Shower with CHG Soap the night before surgery and the  morning of Surgery.  2.  If you choose to wash your hair, wash your hair first as usual with your  normal  shampoo.  3.  After you shampoo, rinse your hair and body thoroughly to remove the  shampoo.                           4.  Use CHG as you would any other liquid soap.  You can apply chg directly  to the skin and wash                       Gently with a scrungie or clean washcloth.  5.  Apply the CHG Soap to your body ONLY FROM THE NECK DOWN.   Do not use on face/ open                           Wound or open sores. Avoid contact with eyes, ears mouth and genitals (private parts).                       Wash face,  Genitals (private parts) with your normal soap.             6.  Wash thoroughly, paying special attention to the area where your surgery  will be performed.  7.  Thoroughly rinse your body with warm water from the neck down.  8.  DO NOT shower/wash with your normal soap after using and rinsing off  the CHG Soap.                9.  Pat yourself dry with a clean towel.            10.  Wear clean pajamas.            11.  Place clean sheets on your bed the night of your first shower and do not  sleep with pets. Day of Surgery : Do not apply any lotions/deodorants the morning of surgery.  Please wear clean clothes to the hospital/surgery center.  FAILURE TO FOLLOW THESE INSTRUCTIONS MAY RESULT IN THE CANCELLATION OF  YOUR SURGERY PATIENT SIGNATURE_________________________________  NURSE SIGNATURE__________________________________  ________________________________________________________________________   Melissa Velazquez  An incentive spirometer is a tool that can help keep your lungs clear and active. This tool measures how well you are filling your lungs with each breath. Taking long deep breaths may help reverse or decrease the chance of developing breathing (pulmonary) problems (especially infection) following:  A long period of time when you are unable to move or be active. BEFORE THE PROCEDURE   If the spirometer includes an indicator to show your  best effort, your nurse or respiratory therapist will set it to a desired goal.  If possible, sit up straight or lean slightly forward. Try not to slouch.  Hold the incentive spirometer in an upright position. INSTRUCTIONS FOR USE  1. Sit on the edge of your bed if possible, or sit up as far as you can in bed or on a chair. 2. Hold the incentive spirometer in an upright position. 3. Breathe out normally. 4. Place the mouthpiece in your mouth and seal your lips tightly around it. 5. Breathe in slowly and as deeply as possible, raising the piston or the ball toward the top of the column. 6. Hold your breath for 3-5 seconds or for as long as possible. Allow the piston or ball to fall to the bottom of the column. 7. Remove the mouthpiece from your mouth and breathe out normally. 8. Rest for a few seconds and repeat Steps 1 through 7 at least 10 times every 1-2 hours when you are awake. Take your time and take a few normal breaths between deep breaths. 9. The spirometer may include an indicator to show your best effort. Use the indicator as a goal to work toward during each repetition. 10. After each set of 10 deep breaths, practice coughing to be sure your lungs are clear. If you have an incision (the cut made at the time of surgery), support your  incision when coughing by placing a pillow or rolled up towels firmly against it. Once you are able to get out of bed, walk around indoors and cough well. You may stop using the incentive spirometer when instructed by your caregiver.  RISKS AND COMPLICATIONS  Take your time so you do not get dizzy or light-headed.  If you are in pain, you may need to take or ask for pain medication before doing incentive spirometry. It is harder to take a deep breath if you are having pain. AFTER USE  Rest and breathe slowly and easily.  It can be helpful to keep track of a log of your progress. Your caregiver can provide you with a simple table to help with this. If you are using the spirometer at home, follow these instructions: [place] IF:   You are having difficultly using the spirometer.  You have trouble using the spirometer as often as instructed.  Your pain medication is not giving enough relief while using the spirometer.  You develop fever of 100.5 F (38.1 C) or higher. SEEK IMMEDIATE MEDICAL CARE IF:   You cough up bloody sputum that had not been present before.  You develop fever of 102 F (38.9 C) or greater.  You develop worsening pain at or near the incision site. MAKE SURE YOU:   Understand these instructions.  Will watch your condition.  Will get help right away if you are not doing well or get worse. Document Released: 05/18/2006 Document Revised: 03/30/2011 Document Reviewed: 07/19/2006 [facility] Patient Information 2014 [place], [place].   ________________________________________________________________________

## 2020-01-18 ENCOUNTER — Encounter (HOSPITAL_COMMUNITY): Payer: Self-pay

## 2020-01-18 ENCOUNTER — Encounter (HOSPITAL_COMMUNITY)
Admission: RE | Admit: 2020-01-18 | Discharge: 2020-01-18 | Disposition: A | Discharge status: Home / Self Care | Payer: Medicare Other | Source: Ambulatory Visit | Attending: Orthopedic Surgery | Admitting: Orthopedic Surgery

## 2020-01-18 ENCOUNTER — Other Ambulatory Visit: Payer: Self-pay

## 2020-01-18 DIAGNOSIS — Z01818 Encounter for other preprocedural examination: Secondary | ICD-10-CM | POA: Insufficient documentation

## 2020-01-18 HISTORY — DX: Malignant neoplasm of unspecified kidney, except renal pelvis: C64.9

## 2020-01-18 HISTORY — DX: Chronic kidney disease, unspecified: N18.9

## 2020-01-18 LAB — COMPREHENSIVE METABOLIC PANEL
ALT: 18 U/L (ref 0–44)
AST: 23 U/L (ref 15–41)
Albumin: 4 g/dL (ref 3.5–5.0)
Alkaline Phosphatase: 59 U/L (ref 38–126)
Anion gap: 8 (ref 5–15)
BUN: 22 mg/dL (ref 8–23)
CO2: 28 mmol/L (ref 22–32)
Calcium: 9.2 mg/dL (ref 8.9–10.3)
Chloride: 107 mmol/L (ref 98–111)
Creatinine, Ser: 0.93 mg/dL (ref 0.44–1.00)
GFR, Estimated: >60 mL/min (ref >60)
Glucose, Bld: 95 mg/dL (ref 70–99)
Potassium: 4.4 mmol/L (ref 3.5–5.1)
Sodium: 143 mmol/L (ref 135–145)
Total Bilirubin: 0.6 mg/dL (ref 0.3–1.2)
Total Protein: 6.8 g/dL (ref 6.5–8.1)

## 2020-01-18 LAB — PROTIME-INR
INR: 1 (ref 0.8–1.2)
Prothrombin Time: 12.8 seconds (ref 11.4–15.2)

## 2020-01-18 LAB — CBC
HCT: 40.6 % (ref 36.0–46.0)
Hemoglobin: 13.4 g/dL (ref 12.0–15.0)
MCH: 31.5 pg (ref 26.0–34.0)
MCHC: 33 g/dL (ref 30.0–36.0)
MCV: 95.3 fL (ref 80.0–100.0)
Platelets: 149 10*3/uL — ABNORMAL LOW (ref 150–400)
RBC: 4.26 MIL/uL (ref 3.87–5.11)
RDW: 13.2 % (ref 11.5–15.5)
WBC: 3.9 10*3/uL — ABNORMAL LOW (ref 4.0–10.5)
nRBC: 0 % (ref 0.0–0.2)

## 2020-01-18 LAB — SURGICAL PCR SCREEN
MRSA, PCR: NEGATIVE
Staphylococcus aureus: POSITIVE — AB

## 2020-01-18 LAB — HEMOGLOBIN A1C
Hgb A1c MFr Bld: 5.1 % (ref 4.8–5.6)
Mean Plasma Glucose: 99.67 mg/dL

## 2020-01-18 LAB — APTT: aPTT: 29 seconds (ref 24–36)

## 2020-01-18 NOTE — Progress Notes (Signed)
COVID Vaccine Completed: Yes Date COVID Vaccine completed: 10/21/19 COVID vaccine manufacturer:   Moderna   PCP - Dr. Murlean Hark Cardiologist - No  Chest x-ray -  EKG -  Stress Test -  ECHO -  Cardiac Cath -  Pacemaker/ICD device last checked:  Sleep Study -  CPAP -   Fasting Blood Sugar - N/A Checks Blood Sugar ___0__ times a day  Blood Thinner Instructions: Aspirin Instructions: Last Dose:  Anesthesia review: Hx: HTN,DIA  Patient denies shortness of breath, fever, cough and chest pain at PAT appointment   Patient verbalized understanding of instructions that were given to them at the PAT appointment. Patient was also instructed that they will need to review over the PAT instructions again at home before surgery.

## 2020-01-19 DIAGNOSIS — M1991 Primary osteoarthritis, unspecified site: Secondary | ICD-10-CM | POA: Diagnosis not present

## 2020-01-19 DIAGNOSIS — E039 Hypothyroidism, unspecified: Secondary | ICD-10-CM | POA: Diagnosis not present

## 2020-01-19 DIAGNOSIS — I10 Essential (primary) hypertension: Secondary | ICD-10-CM | POA: Diagnosis not present

## 2020-01-22 ENCOUNTER — Other Ambulatory Visit (HOSPITAL_COMMUNITY): Payer: Medicare Other

## 2020-01-22 NOTE — H&P (Signed)
TOTAL KNEE ADMISSION H&P  Patient is being admitted for bilateral total knee arthroplasty.  Subjective:  Chief Complaint: Bilateral knee pain.  HPI: Melissa Velazquez, 70 y.o. female has a history of pain and functional disability in both knees due to arthritis and has failed non-surgical conservative treatments for greater than 12 weeks to include corticosteriod injections and activity modification. Onset of symptoms was gradual, starting several years ago with gradually worsening course since that time. The patient noted no past surgery on either knee.  Patient currently rates pain in both knees at 7 out of 10 with activity. Patient has worsening of pain with activity and weight bearing, pain that interferes with activities of daily living, crepitus and instability. Patient has evidence of bone-on-bone arthritis in the medial and patellofemoral compartments of the bilateral knees, slightly worse on the right than the left by imaging studies. There is no active infection.  Patient Active Problem List   Diagnosis Date Noted  . Seasonal and perennial allergic rhinitis 12/24/2019  . Allergic contact dermatitis 12/24/2019  . Mild intermittent asthma, uncomplicated 12/24/2019  . IBS (irritable bowel syndrome) 04/11/2019  . History of colonic polyps 11/25/2016  . Renal mass 08/21/2016  . Upper airway cough syndrome 07/06/2014  . Essential hypertension 07/06/2014  . Personal history of colonic polyps 09/17/2010  . BACK PAIN 09/14/2008  . HIRSUTISM 06/15/2008  . GLUCOSE INTOLERANCE 01/09/2008  . NEOPLASM, MALIGNANT, UTERUS 12/27/2006  . THYROID NODULE 12/27/2006  . DIVERTICULAR DISEASE 06/03/2006  . OBESITY NOS 03/09/2006  . DEPRESSION 03/09/2006  . ALLERGIC RHINITIS 03/09/2006  . ASTHMA 03/09/2006  . GERD 03/09/2006  . ELEVATED BLOOD PRESSURE WITHOUT DIAGNOSIS OF HYPERTENSION 03/09/2006    Past Medical History:  Diagnosis Date  . Allergic rhinitis   . Anal fissure    Hx of   . Anxiety     hx of  . Arthritis   . Asthma    none in last year   . Chronic kidney disease    partial nephrectomy  . Depression    hx of  . Diabetes mellitus without complication (HCC)    no meds  . Diverticular disease   . Dysrhythmia    palpitations  . Eczema   . Endometrial cancer (HCC) 2001   spread to appendix   . Hemorrhoids   . Hyperglycemia   . Hypertension   . Hypothyroidism   . Kidney cancer, primary, with metastasis from kidney to other site [facility])   . Obesity   . Sebaceous cyst   . Thyroid disease   . Thyroid nodule   . 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 APPENDECTOMY  2008  . BIOPSY  12/14/2016   Procedure: BIOPSY;  Surgeon: Malissa Hippo, MD;  Location[facility];  Service: Endoscopy;;  colon  . CHOLECYSTECTOMY  2008  . COLONOSCOPY N/A 11/24/2012   Procedure: COLONOSCOPY;  Surgeon: Malissa Hippo, MD;  Location: [facility];  Service: Endoscopy;  Laterality: N/A;  830  . COLONOSCOPY N/A 12/14/2016   Procedure: COLONOSCOPY;  Surgeon: Malissa Hippo, MD;  Location: [facility];  Service: Endoscopy;  Laterality: N/A;  12:00  . POLYPECTOMY  12/14/2016   Procedure: POLYPECTOMY;  Surgeon: Malissa Hippo, MD;  Location: [facility];  Service: Endoscopy;;  colon  . ROBOTIC ASSITED PARTIAL NEPHRECTOMY Left 08/21/2016   Procedure: XI ROBOTIC ASSITED PARTIAL NEPHRECTOMY;  Surgeon: Crist Fat, MD;  Location:  [facility];  Service: Urology;  Laterality: Left;  . TOTAL ABDOMINAL HYSTERECTOMY  2001  . WISDOM TOOTH EXTRACTION      Prior to Admission medications   Medication Sig Start Date End Date Taking? Authorizing Provider  albuterol (PROVENTIL HFA;VENTOLIN HFA) 108 (90 Base) MCG/ACT inhaler Inhale 2 puffs into the lungs every 6 (six) hours as needed for wheezing or shortness of breath.   Yes [provider]  cetirizine (ZYRTEC) 10 MG tablet Take 10 mg by mouth daily as needed for  allergies.   Yes [provider]  fluticasone (FLONASE) 50 MCG/ACT nasal spray Place 1 spray into both nostrils 2 (two) times daily as needed for allergies or rhinitis. 12/22/19  Yes Valentina Shaggy, MD  levothyroxine (SYNTHROID, LEVOTHROID) 75 MCG tablet Take 75 mcg by mouth daily before breakfast.   Yes [provider]  meloxicam (MOBIC) 7.5 MG tablet Take 7.5 mg by mouth daily.   Yes [provider]  sertraline (ZOLOFT) 100 MG tablet Take 150 mg by mouth daily with breakfast.    Yes [provider]  triamcinolone cream (KENALOG) 0.1 % Apply 1 application topically daily as needed (rash).   Yes [provider]  azelastine (ASTELIN) 0.1 % nasal spray Place 1 spray into both nostrils 2 (two) times daily as needed for rhinitis. Patient not taking: Reported on 01/09/2020 12/22/19   Valentina Shaggy, MD  levocetirizine (XYZAL) 5 MG tablet Take 1 tablet (5 mg total) by mouth every evening. Patient not taking: Reported on 01/09/2020 12/22/19   Valentina Shaggy, MD    Allergies  Allergen Reactions  . Triple Antibiotic [Bacitracin-Neomycin-Polymyxin] Anaphylaxis  . Levofloxacin Other (See Comments)    Stomach upset  . Capsaicin Rash  . Elastic Bandages & [Zinc] Rash  . Nickel Itching and Rash    Social History   Socioeconomic History  . Marital status: Single    Spouse name: Not on file  . Number of children: 0  . Years of education: Not on file  . Highest education level: Not on file  Occupational History  . Occupation: Engineer, water  Tobacco Use  . Smoking status: Former Smoker    Packs/day: 1.00    Years: 20.00    Pack years: 20.00    Types: Cigarettes    Quit date: 01/19/1998    Years since quitting: 22.0  . Smokeless tobacco: Never Used  Vaping Use  . Vaping Use: Never used  Substance and Sexual Activity  . Alcohol use: Yes    Alcohol/week: 0.0 standard drinks    Comment: rare  . Drug use: No  . Sexual activity: Not  Currently  Other Topics Concern  . Not on file  Social History Narrative   2 cups of caffeine daily    Social Determinants of Health   Financial Resource Strain: Not on file  Food Insecurity: Not on file  Transportation Needs: Not on file  Physical Activity: Not on file  Stress: Not on file  Social Connections: Not on file  Intimate Partner Violence: Not on file    Tobacco Use: Medium Risk  . Smoking Tobacco Use: Former Smoker  . Smokeless Tobacco Use: Never Used   Social History   Substance and Sexual Activity  Alcohol Use Yes  . Alcohol/week: 0.0 standard drinks   Comment: rare    Family History  Problem Relation Age of Onset  . Breast cancer Mother   . Diabetes Mother   . Obesity Sister   . Asthma  Sister   . Eczema Sister   . COPD Father        smoked  . Prostate cancer Father   . Breast cancer Maternal Aunt   . Urticaria Maternal Grandfather   . Colon cancer Neg Hx     Review of Systems  Constitutional: Negative for chills and fever.  HENT: Negative for congestion, sore throat and tinnitus.   Eyes: Negative for double vision, photophobia and pain.  Respiratory: Negative for cough, shortness of breath and wheezing.   Cardiovascular: Negative for chest pain, palpitations and orthopnea.  Gastrointestinal: Negative for heartburn, nausea and vomiting.  Genitourinary: Negative for dysuria, frequency and urgency.  Musculoskeletal: Positive for joint pain.  Neurological: Negative for dizziness, weakness and headaches.    Objective:  Physical Exam: Well nourished and well developed.  General: Alert and oriented x3, cooperative and pleasant, no acute distress.  Head: normocephalic, atraumatic, neck supple.  Eyes: EOMI.  Respiratory: breath sounds clear in all fields, no wheezing, rales, or rhonchi. Cardiovascular: Regular rate and rhythm, no murmurs, gallops or rubs.  Abdomen: non-tender to palpation and soft, normoactive bowel  sounds. Musculoskeletal:  Right Knee Exam:  Slight varus deformity.  No effusion present. No swelling present.  The range of motion is: 0 to 120 degrees.  Moderate crepitus on range of motion of the knee.  Positive medial greater than lateral joint line tenderness.  The knee is stable.    Left Knee Exam:  Slight varus deformity.  No effusion present. No Swelling present.  The range of motion is: 0 to 120 degrees.  Moderate crepitus on range of motion of the knee.  Positive medial joint line tenderness.  No lateral joint line tenderness.  Knee is stable.   Calves soft and nontender. Motor function intact in LE. Strength 5/5 LE bilaterally. Neuro: Distal pulses 2+. Sensation to light touch intact in LE.  Imaging Review Plain radiographs demonstrate severe degenerative joint disease of the both knees. The overall alignment is mild varus. The bone quality appears to be adequate for age and reported activity level.  Assessment/Plan:  End stage arthritis, bilateral knees   The patient history, physical examination, clinical judgment of the provider and imaging studies are consistent with end stage degenerative joint disease of bilateral knees and bilateral total knee arthroplasty is deemed medically necessary. The treatment options including medical management, injection therapy arthroscopy and arthroplasty were discussed at length. The risks and benefits of total knee arthroplasty were presented and reviewed. The risks due to aseptic loosening, infection, stiffness, patella tracking problems, thromboembolic complications and other imponderables were discussed. The patient acknowledged the explanation, agreed to proceed with the plan and consent was signed. Patient is being admitted for inpatient treatment for surgery, pain control, PT, OT, prophylactic antibiotics, VTE prophylaxis, progressive ambulation and ADLs and discharge planning. The patient is planning to be discharged to [place].  Anticipated LOS equal to or greater than 2 midnights due to - Age 47 and older with one or more of the following:  - Obesity  - Expected need for hospital services (PT, OT, Nursing) required for safe  discharge  - Anticipated need for postoperative skilled nursing care or inpatient rehab  - Active co-morbidities: None OR   - Unanticipated findings during/Post Surgery: None  - Patient is a high risk of re-admission due to: None   Therapy Plans: CIR then outpatient physical therapy at [facility] Linna Hoff) Disposition: CIR then home with sister Planned DVT Prophylaxis: Xarelto 10 mg QD  DME Needed: None PCP: [name], MD (clearance received) TXA: IV Allergies: Levofloxacin, zinc Anesthesia Concerns: None BMI: 33.5 Last HgbA1c: Not diabetic.  Pharmacy: [facility] / [address])  Other:  - Hx endometrial and kidney CA (s/p partial nephrectomy) - NICKEL ALLERGY  - Patient was instructed on what medications to stop prior to surgery. - Follow-up visit in 2 weeks with Dr. Wynelle Link - Begin physical therapy following surgery - Pre-operative lab work as pre-surgical testing - Prescriptions will be provided in hospital at time of discharge  Theresa Duty, PA-C Orthopedic Surgery [facility] [place]

## 2020-01-22 NOTE — Progress Notes (Signed)
PCR:  positive [facility] 

## 2020-01-23 ENCOUNTER — Other Ambulatory Visit (HOSPITAL_COMMUNITY)
Admission: RE | Admit: 2020-01-23 | Discharge: 2020-01-23 | Disposition: A | Discharge status: Home / Self Care | Payer: Medicare Other | Source: Ambulatory Visit | Attending: Orthopedic Surgery | Admitting: Orthopedic Surgery

## 2020-01-23 DIAGNOSIS — Z20822 Contact with and (suspected) exposure to covid-19: Secondary | ICD-10-CM | POA: Insufficient documentation

## 2020-01-23 DIAGNOSIS — Z01818 Encounter for other preprocedural examination: Secondary | ICD-10-CM | POA: Insufficient documentation

## 2020-01-23 LAB — SARS CORONAVIRUS 2 (TAT 6-24 HRS): SARS Coronavirus 2: NEGATIVE

## 2020-01-23 NOTE — Anesthesia Preprocedure Evaluation (Addendum)
Anesthesia Evaluation  Patient identified by MRN, date of birth, ID band Patient awake    Reviewed: Allergy & Precautions, H&P , NPO status , Patient's Chart, lab work & pertinent test results  Airway Mallampati: III  TM Distance: >3 FB Neck ROM: Full    Dental no notable dental hx. (+) Teeth Intact, Dental Advisory Given   Pulmonary asthma , former smoker,    Pulmonary exam normal breath sounds clear to auscultation       Cardiovascular Exercise Tolerance: Good hypertension, Pt. on medications  Rhythm:Regular Rate:Normal     Neuro/Psych Anxiety Depression negative neurological ROS     GI/Hepatic Neg liver ROS, GERD  ,  Endo/Other  diabetes, Well ControlledHypothyroidism   Renal/GU Renal disease  negative genitourinary   Musculoskeletal  (+) Arthritis , Osteoarthritis,    Abdominal   Peds  Hematology negative hematology ROS (+)   Anesthesia Other Findings   Reproductive/Obstetrics negative OB ROS                            Anesthesia Physical Anesthesia Plan  ASA: II  Anesthesia Plan: MAC and Epidural   Post-op Pain Management:    Induction: Intravenous  PONV Risk Score and Plan: 2 and Ondansetron, Propofol infusion and Midazolam  Airway Management Planned: Simple Face Mask  Additional Equipment:   Intra-op Plan:   Post-operative Plan:   Informed Consent: I have reviewed the patients History and Physical, chart, labs and discussed the procedure including the risks, benefits and alternatives for the proposed anesthesia with the patient or authorized representative who has indicated his/her understanding and acceptance.     Dental advisory given  Plan Discussed with: CRNA  Anesthesia Plan Comments:        Anesthesia Quick Evaluation

## 2020-01-24 ENCOUNTER — Encounter (HOSPITAL_COMMUNITY)
Admission: RE | Disposition: A | Discharge status: Rehabilitation Facility | Payer: Self-pay | Source: Home / Self Care | Attending: Orthopedic Surgery

## 2020-01-24 ENCOUNTER — Inpatient Hospital Stay (HOSPITAL_COMMUNITY): Payer: Medicare Other | Admitting: Anesthesiology

## 2020-01-24 ENCOUNTER — Other Ambulatory Visit: Payer: Self-pay

## 2020-01-24 ENCOUNTER — Encounter (HOSPITAL_COMMUNITY): Payer: Self-pay | Admitting: Orthopedic Surgery

## 2020-01-24 ENCOUNTER — Inpatient Hospital Stay (HOSPITAL_COMMUNITY)
Admission: RE | Admit: 2020-01-24 | Discharge: 2020-01-30 | DRG: 462 | Disposition: A | Discharge status: Rehabilitation Facility | Payer: Medicare Other | Attending: Orthopedic Surgery | Admitting: Orthopedic Surgery

## 2020-01-24 DIAGNOSIS — Z79899 Other long term (current) drug therapy: Secondary | ICD-10-CM

## 2020-01-24 DIAGNOSIS — I480 Paroxysmal atrial fibrillation: Secondary | ICD-10-CM | POA: Diagnosis not present

## 2020-01-24 DIAGNOSIS — Z881 Allergy status to other antibiotic agents status: Secondary | ICD-10-CM | POA: Diagnosis not present

## 2020-01-24 DIAGNOSIS — Z8042 Family history of malignant neoplasm of prostate: Secondary | ICD-10-CM

## 2020-01-24 DIAGNOSIS — Z888 Allergy status to other drugs, medicaments and biological substances status: Secondary | ICD-10-CM

## 2020-01-24 DIAGNOSIS — Z8719 Personal history of other diseases of the digestive system: Secondary | ICD-10-CM

## 2020-01-24 DIAGNOSIS — Z96653 Presence of artificial knee joint, bilateral: Secondary | ICD-10-CM | POA: Diagnosis present

## 2020-01-24 DIAGNOSIS — E1122 Type 2 diabetes mellitus with diabetic chronic kidney disease: Secondary | ICD-10-CM | POA: Diagnosis present

## 2020-01-24 DIAGNOSIS — J4520 Mild intermittent asthma, uncomplicated: Secondary | ICD-10-CM | POA: Diagnosis not present

## 2020-01-24 DIAGNOSIS — E119 Type 2 diabetes mellitus without complications: Secondary | ICD-10-CM | POA: Diagnosis present

## 2020-01-24 DIAGNOSIS — Z803 Family history of malignant neoplasm of breast: Secondary | ICD-10-CM | POA: Diagnosis not present

## 2020-01-24 DIAGNOSIS — M1711 Unilateral primary osteoarthritis, right knee: Secondary | ICD-10-CM | POA: Diagnosis not present

## 2020-01-24 DIAGNOSIS — Z905 Acquired absence of kidney: Secondary | ICD-10-CM

## 2020-01-24 DIAGNOSIS — K649 Unspecified hemorrhoids: Secondary | ICD-10-CM | POA: Diagnosis not present

## 2020-01-24 DIAGNOSIS — Y838 Other surgical procedures as the cause of abnormal reaction of the patient, or of later complication, without mention of misadventure at the time of the procedure: Secondary | ICD-10-CM | POA: Diagnosis not present

## 2020-01-24 DIAGNOSIS — M170 Bilateral primary osteoarthritis of knee: Secondary | ICD-10-CM | POA: Diagnosis not present

## 2020-01-24 DIAGNOSIS — Z91048 Other nonmedicinal substance allergy status: Secondary | ICD-10-CM | POA: Diagnosis not present

## 2020-01-24 DIAGNOSIS — F419 Anxiety disorder, unspecified: Secondary | ICD-10-CM | POA: Diagnosis present

## 2020-01-24 DIAGNOSIS — Z20822 Contact with and (suspected) exposure to covid-19: Secondary | ICD-10-CM | POA: Diagnosis not present

## 2020-01-24 DIAGNOSIS — D62 Acute posthemorrhagic anemia: Secondary | ICD-10-CM | POA: Diagnosis present

## 2020-01-24 DIAGNOSIS — J90 Pleural effusion, not elsewhere classified: Secondary | ICD-10-CM

## 2020-01-24 DIAGNOSIS — Z885 Allergy status to narcotic agent status: Secondary | ICD-10-CM | POA: Diagnosis not present

## 2020-01-24 DIAGNOSIS — K219 Gastro-esophageal reflux disease without esophagitis: Secondary | ICD-10-CM | POA: Diagnosis present

## 2020-01-24 DIAGNOSIS — Z825 Family history of asthma and other chronic lower respiratory diseases: Secondary | ICD-10-CM

## 2020-01-24 DIAGNOSIS — E039 Hypothyroidism, unspecified: Secondary | ICD-10-CM | POA: Diagnosis not present

## 2020-01-24 DIAGNOSIS — M1712 Unilateral primary osteoarthritis, left knee: Secondary | ICD-10-CM | POA: Diagnosis not present

## 2020-01-24 DIAGNOSIS — G8918 Other acute postprocedural pain: Secondary | ICD-10-CM | POA: Diagnosis not present

## 2020-01-24 DIAGNOSIS — I10 Essential (primary) hypertension: Secondary | ICD-10-CM | POA: Diagnosis present

## 2020-01-24 DIAGNOSIS — N1831 Chronic kidney disease, stage 3a: Secondary | ICD-10-CM | POA: Diagnosis present

## 2020-01-24 DIAGNOSIS — R0602 Shortness of breath: Secondary | ICD-10-CM | POA: Diagnosis not present

## 2020-01-24 DIAGNOSIS — Z87891 Personal history of nicotine dependence: Secondary | ICD-10-CM

## 2020-01-24 DIAGNOSIS — I97191 Other postprocedural cardiac functional disturbances following other surgery: Secondary | ICD-10-CM | POA: Diagnosis not present

## 2020-01-24 DIAGNOSIS — M1710 Unilateral primary osteoarthritis, unspecified knee: Secondary | ICD-10-CM

## 2020-01-24 DIAGNOSIS — I4891 Unspecified atrial fibrillation: Secondary | ICD-10-CM | POA: Diagnosis not present

## 2020-01-24 DIAGNOSIS — Z85528 Personal history of other malignant neoplasm of kidney: Secondary | ICD-10-CM

## 2020-01-24 DIAGNOSIS — Z9109 Other allergy status, other than to drugs and biological substances: Secondary | ICD-10-CM

## 2020-01-24 DIAGNOSIS — Z8542 Personal history of malignant neoplasm of other parts of uterus: Secondary | ICD-10-CM | POA: Diagnosis not present

## 2020-01-24 DIAGNOSIS — E669 Obesity, unspecified: Secondary | ICD-10-CM | POA: Diagnosis not present

## 2020-01-24 DIAGNOSIS — F32A Depression, unspecified: Secondary | ICD-10-CM | POA: Diagnosis present

## 2020-01-24 DIAGNOSIS — K580 Irritable bowel syndrome with diarrhea: Secondary | ICD-10-CM | POA: Diagnosis present

## 2020-01-24 DIAGNOSIS — Z833 Family history of diabetes mellitus: Secondary | ICD-10-CM

## 2020-01-24 DIAGNOSIS — K5790 Diverticulosis of intestine, part unspecified, without perforation or abscess without bleeding: Secondary | ICD-10-CM | POA: Diagnosis not present

## 2020-01-24 DIAGNOSIS — I361 Nonrheumatic tricuspid (valve) insufficiency: Secondary | ICD-10-CM | POA: Diagnosis not present

## 2020-01-24 DIAGNOSIS — R0902 Hypoxemia: Secondary | ICD-10-CM | POA: Diagnosis not present

## 2020-01-24 DIAGNOSIS — Z6833 Body mass index (BMI) 33.0-33.9, adult: Secondary | ICD-10-CM

## 2020-01-24 DIAGNOSIS — I959 Hypotension, unspecified: Secondary | ICD-10-CM | POA: Diagnosis not present

## 2020-01-24 DIAGNOSIS — Z471 Aftercare following joint replacement surgery: Secondary | ICD-10-CM | POA: Diagnosis present

## 2020-01-24 DIAGNOSIS — Z79890 Hormone replacement therapy (postmenopausal): Secondary | ICD-10-CM

## 2020-01-24 DIAGNOSIS — I129 Hypertensive chronic kidney disease with stage 1 through stage 4 chronic kidney disease, or unspecified chronic kidney disease: Secondary | ICD-10-CM | POA: Diagnosis present

## 2020-01-24 DIAGNOSIS — M179 Osteoarthritis of knee, unspecified: Secondary | ICD-10-CM | POA: Insufficient documentation

## 2020-01-24 HISTORY — PX: TOTAL KNEE ARTHROPLASTY: SHX125

## 2020-01-24 LAB — TYPE AND SCREEN
ABO/RH(D): A POS
Antibody Screen: NEGATIVE

## 2020-01-24 LAB — GLUCOSE, CAPILLARY
Glucose-Capillary: 95 mg/dL (ref 70–99)
Glucose-Capillary: 82 mg/dL (ref 70–99)

## 2020-01-24 SURGERY — ARTHROPLASTY, KNEE, BILATERAL, TOTAL
Anesthesia: Monitor Anesthesia Care | Site: Knee | Laterality: Bilateral

## 2020-01-24 MED ORDER — SERTRALINE HCL 50 MG PO TABS
150.0000 mg | ORAL_TABLET | Freq: Every day | ORAL | Status: DC
  Administered 2020-01-25 – 2020-01-30 (×6): 150 mg via ORAL
  Filled 2020-01-24 (×6): qty 1

## 2020-01-24 MED ORDER — ORAL CARE MOUTH RINSE: 15.0000 mL | Freq: Once | OROMUCOSAL | Status: AC

## 2020-01-24 MED ORDER — FENTANYL CITRATE (PF) 100 MCG/2ML IJ SOLN
50.0000 ug | INTRAMUSCULAR | Status: DC
  Administered 2020-01-24: 100 ug via INTRAVENOUS
  Filled 2020-01-24: qty 2

## 2020-01-24 MED ORDER — PROPOFOL 500 MG/50ML IV EMUL
INTRAVENOUS | Status: DC | PRN
  Administered 2020-01-24: 75 ug/kg/min via INTRAVENOUS

## 2020-01-24 MED ORDER — CEFAZOLIN SODIUM-DEXTROSE 2-4 GM/100ML-% IV SOLN
2.0000 g | INTRAVENOUS | Status: AC
  Administered 2020-01-24: 2 g via INTRAVENOUS
  Filled 2020-01-24: qty 100

## 2020-01-24 MED ORDER — SODIUM CHLORIDE 0.9 % IV SOLN: INTRAVENOUS | Status: DC

## 2020-01-24 MED ORDER — ONDANSETRON HCL 4 MG PO TABS: 4.0000 mg | ORAL_TABLET | Freq: Four times a day (QID) | ORAL | Status: DC | PRN

## 2020-01-24 MED ORDER — LORATADINE 10 MG PO TABS
10.0000 mg | ORAL_TABLET | Freq: Every day | ORAL | Status: DC
  Administered 2020-01-24 – 2020-01-30 (×7): 10 mg via ORAL
  Filled 2020-01-24 (×7): qty 1

## 2020-01-24 MED ORDER — METHOCARBAMOL 500 MG IVPB - SIMPLE MED
500.0000 mg | Freq: Four times a day (QID) | INTRAVENOUS | Status: DC | PRN
  Filled 2020-01-24: qty 50

## 2020-01-24 MED ORDER — FLUTICASONE PROPIONATE 50 MCG/ACT NA SUSP
1.0000 | Freq: Two times a day (BID) | NASAL | Status: DC | PRN
  Administered 2020-01-26: 1 via NASAL
  Filled 2020-01-24: qty 16

## 2020-01-24 MED ORDER — PROPOFOL 500 MG/50ML IV EMUL
INTRAVENOUS | Status: AC
  Filled 2020-01-24: qty 50

## 2020-01-24 MED ORDER — EPHEDRINE 5 MG/ML INJ
INTRAVENOUS | Status: AC
  Filled 2020-01-24: qty 10

## 2020-01-24 MED ORDER — DEXAMETHASONE SODIUM PHOSPHATE 10 MG/ML IJ SOLN
INTRAMUSCULAR | Status: AC
  Filled 2020-01-24: qty 1

## 2020-01-24 MED ORDER — MENTHOL 3 MG MT LOZG: 1.0000 | LOZENGE | OROMUCOSAL | Status: DC | PRN

## 2020-01-24 MED ORDER — BISACODYL 10 MG RE SUPP: 10.0000 mg | Freq: Every day | RECTAL | Status: DC | PRN

## 2020-01-24 MED ORDER — CHLORHEXIDINE GLUCONATE 0.12 % MT SOLN
15.0000 mL | Freq: Once | OROMUCOSAL | Status: AC
  Administered 2020-01-24: 15 mL via OROMUCOSAL

## 2020-01-24 MED ORDER — PROPOFOL 10 MG/ML IV BOLUS
INTRAVENOUS | Status: AC
  Filled 2020-01-24: qty 20

## 2020-01-24 MED ORDER — POLYETHYLENE GLYCOL 3350 17 G PO PACK: 17.0000 g | PACK | Freq: Every day | ORAL | Status: DC | PRN

## 2020-01-24 MED ORDER — 0.9 % SODIUM CHLORIDE (POUR BTL) OPTIME
TOPICAL | Status: DC | PRN
  Administered 2020-01-24: 1000 mL

## 2020-01-24 MED ORDER — OXYCODONE HCL 5 MG PO TABS
10.0000 mg | ORAL_TABLET | ORAL | Status: DC | PRN
  Administered 2020-01-24: 10 mg via ORAL
  Administered 2020-01-25 – 2020-01-26 (×7): 15 mg via ORAL
  Filled 2020-01-24 (×9): qty 3

## 2020-01-24 MED ORDER — SODIUM CHLORIDE 0.9 % IR SOLN
Status: DC | PRN
  Administered 2020-01-24: 3000 mL

## 2020-01-24 MED ORDER — OXYCODONE HCL 5 MG PO TABS
5.0000 mg | ORAL_TABLET | ORAL | Status: DC | PRN
  Administered 2020-01-25 – 2020-01-27 (×3): 10 mg via ORAL
  Filled 2020-01-24: qty 1
  Filled 2020-01-24 (×5): qty 2

## 2020-01-24 MED ORDER — ACETAMINOPHEN 10 MG/ML IV SOLN
1000.0000 mg | Freq: Four times a day (QID) | INTRAVENOUS | Status: DC
  Administered 2020-01-24: 1000 mg via INTRAVENOUS
  Filled 2020-01-24: qty 100

## 2020-01-24 MED ORDER — ALBUTEROL SULFATE HFA 108 (90 BASE) MCG/ACT IN AERS
2.0000 | INHALATION_SPRAY | Freq: Four times a day (QID) | RESPIRATORY_TRACT | Status: DC | PRN
  Filled 2020-01-24: qty 6.7

## 2020-01-24 MED ORDER — MIDAZOLAM HCL 2 MG/2ML IJ SOLN
1.0000 mg | Freq: Once | INTRAMUSCULAR | Status: DC
  Filled 2020-01-24: qty 2

## 2020-01-24 MED ORDER — STERILE WATER FOR IRRIGATION IR SOLN
Status: DC | PRN
  Administered 2020-01-24: 2000 mL

## 2020-01-24 MED ORDER — LACTATED RINGERS IV SOLN: INTRAVENOUS | Status: DC

## 2020-01-24 MED ORDER — EPHEDRINE SULFATE-NACL 50-0.9 MG/10ML-% IV SOSY
PREFILLED_SYRINGE | INTRAVENOUS | Status: DC | PRN
  Administered 2020-01-24 (×2): 10 mg via INTRAVENOUS

## 2020-01-24 MED ORDER — METOCLOPRAMIDE HCL 5 MG/ML IJ SOLN: 5.0000 mg | Freq: Three times a day (TID) | INTRAMUSCULAR | Status: DC | PRN

## 2020-01-24 MED ORDER — GABAPENTIN 300 MG PO CAPS
300.0000 mg | ORAL_CAPSULE | Freq: Three times a day (TID) | ORAL | Status: DC
  Administered 2020-01-24 – 2020-01-30 (×18): 300 mg via ORAL
  Filled 2020-01-24 (×18): qty 1

## 2020-01-24 MED ORDER — ONDANSETRON HCL 4 MG/2ML IJ SOLN
INTRAMUSCULAR | Status: AC
  Filled 2020-01-24: qty 2

## 2020-01-24 MED ORDER — PHENOL 1.4 % MT LIQD: 1.0000 | OROMUCOSAL | Status: DC | PRN

## 2020-01-24 MED ORDER — ONDANSETRON HCL 4 MG/2ML IJ SOLN: 4.0000 mg | Freq: Four times a day (QID) | INTRAMUSCULAR | Status: DC | PRN

## 2020-01-24 MED ORDER — BUPIVACAINE-EPINEPHRINE (PF) 0.5% -1:200000 IJ SOLN
INTRAMUSCULAR | Status: DC | PRN
  Administered 2020-01-24: 1 mL

## 2020-01-24 MED ORDER — MORPHINE SULFATE (PF) 2 MG/ML IV SOLN
0.5000 mg | INTRAVENOUS | Status: DC | PRN
  Administered 2020-01-25 (×2): 1 mg via INTRAVENOUS
  Filled 2020-01-24 (×2): qty 1

## 2020-01-24 MED ORDER — ROPIVACAINE HCL 2 MG/ML IJ SOLN
12.0000 mL/h | INTRAMUSCULAR | Status: AC
  Administered 2020-01-24: 10 mL/h via EPIDURAL
  Administered 2020-01-25: 12 mL/h via EPIDURAL
  Administered 2020-01-25: 14 mL/h via EPIDURAL
  Administered 2020-01-26: 12 mL/h via EPIDURAL
  Filled 2020-01-24 (×7): qty 200

## 2020-01-24 MED ORDER — MIDAZOLAM HCL 2 MG/2ML IJ SOLN
INTRAMUSCULAR | Status: AC
  Filled 2020-01-24: qty 2

## 2020-01-24 MED ORDER — FLEET ENEMA 7-19 GM/118ML RE ENEM: 1.0000 | ENEMA | Freq: Once | RECTAL | Status: DC | PRN

## 2020-01-24 MED ORDER — ONDANSETRON HCL 4 MG/2ML IJ SOLN
INTRAMUSCULAR | Status: DC | PRN
  Administered 2020-01-24: 4 mg via INTRAVENOUS

## 2020-01-24 MED ORDER — DEXAMETHASONE SODIUM PHOSPHATE 10 MG/ML IJ SOLN
8.0000 mg | Freq: Once | INTRAMUSCULAR | Status: AC
  Administered 2020-01-24: 8 mg via INTRAVENOUS

## 2020-01-24 MED ORDER — MIDAZOLAM HCL 5 MG/5ML IJ SOLN
INTRAMUSCULAR | Status: DC | PRN
  Administered 2020-01-24 (×2): 1 mg via INTRAVENOUS

## 2020-01-24 MED ORDER — ACETAMINOPHEN 500 MG PO TABS
1000.0000 mg | ORAL_TABLET | Freq: Four times a day (QID) | ORAL | Status: AC
  Administered 2020-01-24 – 2020-01-25 (×4): 1000 mg via ORAL
  Filled 2020-01-24 (×4): qty 2

## 2020-01-24 MED ORDER — DOCUSATE SODIUM 100 MG PO CAPS
100.0000 mg | ORAL_CAPSULE | Freq: Two times a day (BID) | ORAL | Status: DC
  Administered 2020-01-24 – 2020-01-30 (×12): 100 mg via ORAL
  Filled 2020-01-24 (×12): qty 1

## 2020-01-24 MED ORDER — LEVOTHYROXINE SODIUM 75 MCG PO TABS
75.0000 ug | ORAL_TABLET | Freq: Every day | ORAL | Status: DC
  Administered 2020-01-25 – 2020-01-30 (×6): 75 ug via ORAL
  Filled 2020-01-24 (×6): qty 1

## 2020-01-24 MED ORDER — ASPIRIN EC 325 MG PO TBEC
325.0000 mg | DELAYED_RELEASE_TABLET | Freq: Every day | ORAL | Status: DC
  Administered 2020-01-25: 325 mg via ORAL
  Filled 2020-01-24: qty 1

## 2020-01-24 MED ORDER — CEFAZOLIN SODIUM-DEXTROSE 2-4 GM/100ML-% IV SOLN
2.0000 g | Freq: Four times a day (QID) | INTRAVENOUS | Status: AC
  Administered 2020-01-24 (×2): 2 g via INTRAVENOUS
  Filled 2020-01-24 (×2): qty 100

## 2020-01-24 MED ORDER — METOCLOPRAMIDE HCL 5 MG PO TABS
5.0000 mg | ORAL_TABLET | Freq: Three times a day (TID) | ORAL | Status: DC | PRN
Start: 2020-01-24 — End: 2020-01-30

## 2020-01-24 MED ORDER — TRANEXAMIC ACID-NACL 1000-0.7 MG/100ML-% IV SOLN
1000.0000 mg | INTRAVENOUS | Status: AC
  Administered 2020-01-24: 1000 mg via INTRAVENOUS
  Filled 2020-01-24: qty 100

## 2020-01-24 MED ORDER — DEXAMETHASONE SODIUM PHOSPHATE 10 MG/ML IJ SOLN
10.0000 mg | Freq: Once | INTRAMUSCULAR | Status: AC
  Administered 2020-01-25: 10 mg via INTRAVENOUS
  Filled 2020-01-24: qty 1

## 2020-01-24 MED ORDER — METHOCARBAMOL 500 MG PO TABS
500.0000 mg | ORAL_TABLET | Freq: Four times a day (QID) | ORAL | Status: DC | PRN
  Administered 2020-01-25 – 2020-01-27 (×7): 500 mg via ORAL
  Filled 2020-01-24 (×8): qty 1

## 2020-01-24 MED ORDER — DIPHENHYDRAMINE HCL 12.5 MG/5ML PO ELIX
12.5000 mg | ORAL_SOLUTION | ORAL | Status: DC | PRN
Start: 2020-01-24 — End: 2020-01-30

## 2020-01-24 SURGICAL SUPPLY — 72 items
BAG SPEC THK2 15X12 ZIP CLS (MISCELLANEOUS) ×2
BAG ZIPLOCK 12X15 (MISCELLANEOUS) ×4 IMPLANT
BANDAGE ESMARK 6X9 LF (GAUZE/BANDAGES/DRESSINGS) ×1 IMPLANT
BASEPLATE TIBIAL LT SZ3 (Knees) ×2 IMPLANT
BASEPLATE TIBIAL SZ 3 (Knees) ×1 IMPLANT
BLADE SAG 18X100X1.27 (BLADE) ×4 IMPLANT
BLADE SAW SGTL 11.0X1.19X90.0M (BLADE) ×4 IMPLANT
BLADE SURG SZ10 CARB STEEL (BLADE) ×8 IMPLANT
BNDG CMPR 9X6 STRL LF SNTH (GAUZE/BANDAGES/DRESSINGS) ×1
BNDG COHESIVE 6X5 TAN STRL LF (GAUZE/BANDAGES/DRESSINGS) ×2 IMPLANT
BNDG ELASTIC 6X5.8 VLCR STR LF (GAUZE/BANDAGES/DRESSINGS) ×4 IMPLANT
BNDG ESMARK 6X9 LF (GAUZE/BANDAGES/DRESSINGS) ×2
BOWL SMART MIX CTS (DISPOSABLE) ×4 IMPLANT
BSPLAT TIB 3 CMNT M TPR KN LT (Knees) ×1 IMPLANT
BSPLAT TIB 3 CMPNT KN RT NPOR (Knees) ×1 IMPLANT
CEMENT HV SMART SET (Cement) ×8 IMPLANT
COVER SURGICAL LIGHT HANDLE (MISCELLANEOUS) ×2 IMPLANT
COVER WAND RF STERILE (DRAPES) IMPLANT
CUFF TOURN SGL QUICK 34 (TOURNIQUET CUFF) ×4
CUFF TRNQT CYL 34X4.125X (TOURNIQUET CUFF) ×2 IMPLANT
DRAPE BILATERAL LIMB T (DRAPES) ×2 IMPLANT
DRAPE INCISE IOBAN 66X45 STRL (DRAPES) ×2 IMPLANT
DRAPE U-SHAPE 47X51 STRL (DRAPES) ×6 IMPLANT
DRSG ADAPTIC 3X8 NADH LF (GAUZE/BANDAGES/DRESSINGS) ×4 IMPLANT
DRSG AQUACEL AG ADV 3.5X10 (GAUZE/BANDAGES/DRESSINGS) ×4 IMPLANT
DRSG PAD ABDOMINAL 8X10 ST (GAUZE/BANDAGES/DRESSINGS) ×4 IMPLANT
DURAPREP 26ML APPLICATOR (WOUND CARE) ×4 IMPLANT
ELECT REM PT RETURN 15FT ADLT (MISCELLANEOUS) ×2 IMPLANT
EVACUATOR 1/8 PVC DRAIN (DRAIN) ×4 IMPLANT
FACESHIELD WRAPAROUND (MASK) ×4 IMPLANT
FEM COMP OXINIUM SZ 5 LT (Knees) ×2 IMPLANT
FEMORAL COMP OXINIUM SZ 5 LT (Knees) ×1 IMPLANT
FEMUR OXINIUM SZ 5 RT (Knees) ×2 IMPLANT
GAUZE SPONGE 4X4 12PLY STRL (GAUZE/BANDAGES/DRESSINGS) ×4 IMPLANT
GLOVE BIO SURGEON STRL SZ8 (GLOVE) ×4 IMPLANT
GLOVE BIOGEL PI IND STRL 7.0 (GLOVE) ×1 IMPLANT
GLOVE BIOGEL PI INDICATOR 7.0 (GLOVE) ×1
GLOVE SRG 8 PF TXTR STRL LF DI (GLOVE) ×1 IMPLANT
GLOVE SURG ENC MOIS LTX SZ6.5 (GLOVE) ×4 IMPLANT
GLOVE SURG UNDER POLY LF SZ8 (GLOVE) ×2
GOWN STRL REUS W/TWL LRG LVL3 (GOWN DISPOSABLE) ×4 IMPLANT
HANDPIECE INTERPULSE COAX TIP (DISPOSABLE) ×2
IMMOBILIZER KNEE 20 (SOFTGOODS) ×4
IMMOBILIZER KNEE 20 THIGH 36 (SOFTGOODS) ×2 IMPLANT
INSERT SPEED PIN RIMMED 45MM (PIN) ×6 IMPLANT
INSERT XLPE 9MM SZ 3-4 (Knees) ×4 IMPLANT
KIT TURNOVER KIT A (KITS) IMPLANT
MANIFOLD NEPTUNE II (INSTRUMENTS) ×2 IMPLANT
NS IRRIG 1000ML POUR BTL (IV SOLUTION) ×2 IMPLANT
PACK TOTAL KNEE CUSTOM (KITS) ×2 IMPLANT
PAD CAST 4YDX4 CTTN HI CHSV (CAST SUPPLIES) ×2 IMPLANT
PADDING CAST COTTON 4X4 STRL (CAST SUPPLIES) ×4
PADDING CAST COTTON 6X4 STRL (CAST SUPPLIES) ×12 IMPLANT
PATELLA RESURF GEN II 32MM (Orthopedic Implant) ×4 IMPLANT
PENCIL SMOKE EVACUATOR (MISCELLANEOUS) IMPLANT
PIN TROCAR 3 INCH (PIN) ×16 IMPLANT
SET HNDPC FAN SPRY TIP SCT (DISPOSABLE) ×1 IMPLANT
SPONGE LAP 18X18 RF (DISPOSABLE) ×2 IMPLANT
STOCKINETTE 8 INCH (MISCELLANEOUS) ×2 IMPLANT
STRIP CLOSURE SKIN 1/2X4 (GAUZE/BANDAGES/DRESSINGS) ×8 IMPLANT
SUCTION FRAZIER HANDLE 12FR (TUBING) ×2
SUCTION TUBE FRAZIER 12FR DISP (TUBING) ×1 IMPLANT
SUT MNCRL AB 4-0 PS2 18 (SUTURE) ×4 IMPLANT
SUT STRATAFIX 0 PDS 27 VIOLET (SUTURE) ×2
SUT VIC AB 2-0 CT1 27 (SUTURE) ×12
SUT VIC AB 2-0 CT1 TAPERPNT 27 (SUTURE) ×6 IMPLANT
SUTURE STRATFX 0 PDS 27 VIOLET (SUTURE) ×1 IMPLANT
SYR 50ML LL SCALE MARK (SYRINGE) IMPLANT
TIBIAL BASEPLATE SZ 3 (Knees) ×2 IMPLANT
TRAY FOLEY MTR SLVR 16FR STAT (SET/KITS/TRAYS/PACK) IMPLANT
WATER STERILE IRR 1000ML POUR (IV SOLUTION) ×4 IMPLANT
WRAP KNEE MAXI GEL POST OP (GAUZE/BANDAGES/DRESSINGS) ×4 IMPLANT

## 2020-01-24 NOTE — Progress Notes (Signed)
Orthopedic Tech Progress Note Patient Details:  Melissa Velazquez March 14, 1950 [id number]  Ortho Devices Ortho Device/Splint Location: off cpm   Post Interventions Patient Tolerated: Well Instructions Provided: Care of device   Jennye Moccasin 01/24/2020, 6:42 PM

## 2020-01-24 NOTE — Op Note (Signed)
Pre-operative diagnosis- Osteoarthritis  Bilateral knee(s)  Post-operative diagnosis- Osteoarthritis Bilateral knee(s)  Procedure-  Bilateral  Total Knee Arthroplasty ([employer] Legion Oxinium knee due to nickel allergy)  Surgeon- Gus Rankin. Almendra Loria, MD  Assistant- Arther Abbott, PA-C   Anesthesia-  Spinal and Epidural  EBL-50 mL   Drains None  Tourniquet time-  Total Tourniquet Time Documented: Thigh (Left) - 43 minutes Total: Thigh (Left) - 43 minutes  Thigh (Right) - 36 minutes Total: Thigh (Right) - 36 minutes     Complications- None  Condition-PACU - hemodynamically stable.   Brief Clinical Note  Melissa Velazquez is a 70 y.o. year old female with end stage OA of both knees with progressively worsening pain and dysfunction. The patient has constant pain, with activity and at rest and significant functional deficits with difficulties even with ADLs. The patient has had extensive non-op management including analgesics, injections of cortisone and viscosupplements, and home exercise program, but remains in significant pain with significant dysfunction. We discussed replacing both knees in the same setting versus one at a time including procedure, risks, potential complications, rehab course, and pros and cons associated with each and the patient elects to do both knees at the same time. The patient presents now for bilateral Total Knee Arthroplasty.     Procedure in detail---    The patient is brought into the operating room and positioned supine on the operating table. After successful administration of  Spinal and Epidural,   a tourniquet is placed high on the  Bilateral thigh(s) and the lower extremities are prepped and draped in the usual sterile fashion. Time out is performed by the operating team and then the  Left lower extremity is wrapped in Esmarch, knee flexed and the tourniquet inflated to 300 mmHg.       A midline incision is made with a ten blade through the  subcutaneous tissue to the level of the extensor mechanism. A fresh blade is used to make a medial parapatellar arthrotomy. Soft tissue over the proximal medial tibia is subperiosteally elevated to the joint line with a knife and into the semimembranosus bursa with a Cobb elevator. Soft tissue over the proximal lateral tibia is elevated with attention being paid to avoiding the patellar tendon on the tibial tubercle. The patella is everted, knee flexed 90 degrees and the ACL and PCL are removed. Findings are bone on bone medial and patellofemoral with large global osteophytes.        The drill is used to create a starting hole in the distal femur and the canal is thoroughly irrigated with sterile saline to remove the fatty contents. The 5 degree Left  valgus alignment guide is placed into the femoral canal and the distal femoral cutting block is pinned to remove 9 mm off the distal femur. Resection is made with an oscillating saw.      The tibia is subluxed forward and the menisci are removed. The extramedullary alignment guide is placed referencing proximally at the medial aspect of the tibial tubercle and distally along the second metatarsal axis and tibial crest. The block is pinned to remove 54mm off the more deficient medial  side. Resection is made with an oscillating saw. Size 3is the most appropriate size for the tibia and the proximal tibia is prepared with the modular drill and keel punch for that size.      The femoral sizing guide is placed and size 5 is most appropriate. Rotation is marked off the epicondylar  axis and confirmed by creating a rectangular flexion gap at 90 degrees. The size 5 cutting block is pinned in this rotation and the anterior, posterior and chamfer cuts are made with the oscillating saw. The intercondylar block is then placed and that cut is made.      Trial size 3 tibial component, trial size 5 posterior stabilized femur and a 9  mm posterior stabilized fixed bearing insert  trial is placed. Full extension is achieved with excellent varus/valgus and anterior/posterior balance throughout full range of motion. The patella is everted and thickness measured to be 22  mm. Free hand resection is taken to 12 mm, a 32 template is placed, lug holes are drilled, trial patella is placed, and it tracks normally. Osteophytes are removed off the posterior femur with the trial in place. All trials are removed and the cut bone surfaces prepared with pulsatile lavage. Cement is mixed and once ready for implantation, the size 3 tibial implant, size  5 posterior stabilized femoral component, and the size 32 patella are cemented in place and the patella is held with the clamp. The trial insert is placed and the knee held in full extension.  All extruded cement is removed and once the cement is hard the permanent 9 mm posterior stabilized fixed bearing insert is placed into the tibial tray.      The wound is copiously irrigated with saline solution and the extensor mechanism closed over a hemovac drain with #1 V-loc suture. The tourniquet is released for a total tourniquet time of 43  minutes. Flexion against gravity is 140 degrees and the patella tracks normally. Subcutaneous tissue is closed with 2.0 vicryl and subcuticular with running 4.0 Monocryl.       The  Right lower extremity is wrapped in Esmarch, knee flexed and the tourniquet inflated to 300 mmHg.       A midline incision is made with a ten blade through the subcutaneous tissue to the level of the extensor mechanism. A fresh blade is used to make a medial parapatellar arthrotomy. Soft tissue over the proximal medial tibia is subperiosteally elevated to the joint line with a knife and into the semimembranosus bursa with a Cobb elevator. Soft tissue over the proximal lateral tibia is elevated with attention being paid to avoiding the patellar tendon on the tibial tubercle. The patella is everted, knee flexed 90 degrees and the ACL and PCL are  removed. Findings are bone on bone medial and patellofemoral with large global osteo[phytes.        The drill is used to create a starting hole in the distal femur and the canal is thoroughly irrigated with sterile saline to remove the fatty contents. The 5 degree Right  valgus alignment guide is placed into the femoral canal and the distal femoral cutting block is pinned to remove 9 mm off the distal femur. Resection is made with an oscillating saw.      The tibia is subluxed forward and the menisci are removed. The extramedullary alignment guide is placed referencing proximally at the medial aspect of the tibial tubercle and distally along the second metatarsal axis and tibial crest. The block is pinned to remove 16mm off the more deficient medial  side. Resection is made with an oscillating saw. Size 3 is the most appropriate size for the tibia and the proximal tibia is prepared with the modular drill and keel punch for that size.      The femoral sizing guide is placed and  size 5 is most appropriate. Rotation is marked off the epicondylar axis and confirmed by creating a rectangular flexion gap at 90 degrees. The size 5 cutting block is pinned in this rotation and the anterior, posterior and chamfer cuts are made with the oscillating saw. The intercondylar block is then placed and that cut is made.      Trial size 3 tibial component, trial size 5 posterior stabilized femur and a 9  mm posterior stabilized rotating platform insert trial is placed. Full extension is achieved with excellent varus/valgus and anterior/posterior balance throughout full range of motion. The patella is everted and thickness measured to be 22  mm. Free hand resection is taken to 12 mm, a 32 template is placed, lug holes are drilled, trial patella is placed, and it tracks normally. Osteophytes are removed off the posterior femur with the trial in place. All trials are removed and the cut bone surfaces prepared with pulsatile lavage.  Cement is mixed and once ready for implantation, the size 3 tibial implant, size  5 posterior stabilized femoral component, and the size 32 patella are cemented in place and the patella is held with the clamp. The trial insert is placed and the knee held in full extension.   All extruded cement is removed and once the cement is hard the permanent 9 mm posterior stabilized rotating platform insert is placed into the tibial tray.      The wound is copiously irrigated with saline solution and the extensor mechanism closed over a hemovac drain with #1 V-loc suture. The tourniquet is released for a total tourniquet time of 36  minutes. Flexion against gravity is 140 degrees and the patella tracks normally. Subcutaneous tissue is closed with 2.0 vicryl and subcuticular with running 4.0 Monocryl. The incisions are cleaned and dried and steri-strips and  bulky sterile dressings are applied. The limbs are placed into knee immobilizers and the patient is awakened and transported to recovery in stable condition.            Please note that a surgical assistant was a medical necessity for this procedure in order to perform it in a safe and expeditious manner. Surgical assistant was necessary to retract the ligaments and vital neurovascular structures to prevent injury to them and also necessary for proper positioning of the limb to allow for anatomic placement of the prosthesis.   Dione Plover Bhavesh Vazquez, MD    01/24/2020, 3:48 PM

## 2020-01-24 NOTE — Progress Notes (Signed)
Pt in PACU with mild discomfort above the left knee. SA test dose with 2cc of 2% Lidocaine with 1:200k epi negative. I then bolused the epidural catheter with 10cc of 0.2% Ropivicaine and started the infusion at 10cc/hr.

## 2020-01-24 NOTE — Anesthesia Procedure Notes (Addendum)
Epidural Patient location during procedure: OR Start time: 01/24/2020 2:00 PM End time: 01/24/2020 2:10 PM  Staffing Anesthesiologist: Lannie Fields, DO Performed: anesthesiologist   Preanesthetic Checklist Completed: patient identified, IV checked, risks and benefits discussed, monitors and equipment checked, pre-op evaluation and timeout performed  Epidural Patient position: sitting Prep: DuraPrep and site prepped and draped Patient monitoring: continuous pulse ox, blood pressure, heart rate and cardiac monitor Approach: midline Location: L3-L4 Injection technique: LOR air  Needle:  Needle type: Tuohy  Needle gauge: 17 G Needle length: 9 cm Needle insertion depth: 6 cm Catheter type: closed end flexible Catheter size: 19 Gauge Catheter at skin depth: 11 cm Test dose: negative  Assessment Sensory level: T8 Events: blood not aspirated, injection not painful, no injection resistance, no paresthesia and negative IV test  Additional Notes Intrathecal catheter used for case, removed at the end. Epidural placed one level below postoperatively for pain management, no issuesReason for block:procedure for pain

## 2020-01-24 NOTE — Anesthesia Procedure Notes (Signed)
Epidural  Start time: 01/24/2020 11:15 AM End time: 01/24/2020 11:30 AM  Staffing Anesthesiologist: Gaynelle Adu, MD Performed: anesthesiologist   Preanesthetic Checklist Completed: patient identified, IV checked, site marked, risks and benefits discussed, surgical consent, monitors and equipment checked, pre-op evaluation and timeout performed  Epidural Patient position: sitting Prep: DuraPrep and site prepped and draped Patient monitoring: continuous pulse ox, blood pressure, heart rate and cardiac monitor Approach: midline Location: L3-L4 Injection technique: LOR air  Needle:  Needle type: Tuohy  Needle gauge: 17 G Needle length: 9 cm and 9 Needle insertion depth: 7 cm Catheter type: closed end flexible Catheter size: 19 Gauge Catheter at skin depth: 12 cm Test dose: 2% lidocaine with Epi 1:200 K and positive  Assessment Sensory level: T6  Additional Notes Pt with loss of sensory and motor control after the SA test dose. No CSF noted when the stylet was removed from the needle but I believe this is a spinal catheter. Will use for the procedure and replace in pacu.

## 2020-01-24 NOTE — Anesthesia Procedure Notes (Signed)
Procedure Name: MAC Date/Time: 01/24/2020 11:45 AM Performed by: Lollie Sails, CRNA Pre-anesthesia Checklist: Patient identified, Emergency Drugs available, Suction available, Patient being monitored and Timeout performed Oxygen Delivery Method: Simple face mask

## 2020-01-24 NOTE — Discharge Instructions (Signed)
 Frank Aluisio, MD Total Joint Specialist [facility] [place] [address] [place], [place] [address] [phone]  TOTAL KNEE REPLACEMENT POSTOPERATIVE DIRECTIONS    Knee Rehabilitation, Guidelines Following Surgery  Results after knee surgery are often greatly improved when you follow the exercise, range of motion and muscle strengthening exercises prescribed by your doctor. Safety measures are also important to protect the knee from further injury. If any of these exercises cause you to have increased pain or swelling in your knee joint, decrease the amount until you are comfortable again and slowly increase them. If you have problems or questions, call your caregiver or physical therapist for advice.   HOME CARE INSTRUCTIONS  . Remove items at home which could result in a fall. This includes throw rugs or furniture in walking pathways.  . ICE to the affected knee as much as tolerated. Icing helps control swelling. If the swelling is well controlled you will be more comfortable and rehab easier. Continue to use ice on the knee for pain and swelling from surgery. You may notice swelling that will progress down to the foot and ankle. This is normal after surgery. Elevate the leg when you are not up walking on it.    . Continue to use the breathing machine which will help keep your temperature down. It is common for your temperature to cycle up and down following surgery, especially at night when you are not up moving around and exerting yourself. The breathing machine keeps your lungs expanded and your temperature down. . Do not place pillow under the operative knee, focus on keeping the knee straight while resting  DIET You may resume your previous home diet once you are discharged from the hospital.  DRESSING / WOUND CARE / SHOWERING . Keep your bulky bandage on for 2 days. On the third post-operative day you may remove the Ace bandage and gauze. There is a  waterproof adhesive bandage on your skin which will stay in place until your first follow-up appointment. Once you remove this you will not need to place another bandage . You may begin showering 3 days following surgery, but do not submerge the incision under water.  ACTIVITY For the first 5 days, the key is rest and control of pain and swelling . Do your home exercises twice a day starting on post-operative day 3. On the days you go to physical therapy, just do the home exercises once that day. . You should rest, ice and elevate the leg for 50 minutes out of every hour. Get up and walk/stretch for 10 minutes per hour. After 5 days you can increase your activity slowly as tolerated. . Walk with your walker as instructed. Use the walker until you are comfortable transitioning to a cane. Walk with the cane in the opposite hand of the operative leg. You may discontinue the cane once you are comfortable and walking steadily. . Avoid periods of inactivity such as sitting longer than an hour when not asleep. This helps prevent blood clots.  . You may discontinue the knee immobilizer once you are able to perform a straight leg raise while lying down. . You may resume a sexual relationship in one month or when given the OK by your doctor.  . You may return to work once you are cleared by your doctor.  . Do not drive a car for 6 weeks or until released by your surgeon.  . Do not drive while taking narcotics.  TED HOSE   STOCKINGS Wear the elastic stockings on both legs for three weeks following surgery during the day. You may remove them at night for sleeping.  WEIGHT BEARING Weight bearing as tolerated with assist device (walker, cane, etc) as directed, use it as long as suggested by your surgeon or therapist, typically at least 4-6 weeks.  POSTOPERATIVE CONSTIPATION PROTOCOL Constipation - defined medically as fewer than three stools per week and severe constipation as less than one stool per  week.  One of the most common issues patients have following surgery is constipation.  Even if you have a regular bowel pattern at home, your normal regimen is likely to be disrupted due to multiple reasons following surgery.  Combination of anesthesia, postoperative narcotics, change in appetite and fluid intake all can affect your bowels.  In order to avoid complications following surgery, here are some recommendations in order to help you during your recovery period.  . Colace (docusate) - Pick up an over-the-counter form of Colace or another stool softener and take twice a day as long as you are requiring postoperative pain medications.  Take with a full glass of water daily.  If you experience loose stools or diarrhea, hold the colace until you stool forms back up. If your symptoms do not get better within 1 week or if they get worse, check with your doctor. . Dulcolax (bisacodyl) - Pick up over-the-counter and take as directed by the product packaging as needed to assist with the movement of your bowels.  Take with a full glass of water.  Use this product as needed if not relieved by Colace only.  . MiraLax (polyethylene glycol) - Pick up over-the-counter to have on hand. MiraLax is a solution that will increase the amount of water in your bowels to assist with bowel movements.  Take as directed and can mix with a glass of water, juice, soda, coffee, or tea. Take if you go more than two days without a movement. Do not use MiraLax more than once per day. Call your doctor if you are still constipated or irregular after using this medication for 7 days in a row.  If you continue to have problems with postoperative constipation, please contact the office for further assistance and recommendations.  If you experience "the worst abdominal pain ever" or develop nausea or vomiting, please contact the office immediatly for further recommendations for treatment.  ITCHING If you experience itching with your  medications, try taking only a single pain pill, or even half a pain pill at a time.  You can also use Benadryl over the counter for itching or also to help with sleep.   MEDICATIONS See your medication summary on the "After Visit Summary" that the nursing staff will review with you prior to discharge.  You may have some home medications which will be placed on hold until you complete the course of blood thinner medication.  It is important for you to complete the blood thinner medication as prescribed by your surgeon.  Continue your approved medications as instructed at time of discharge.  PRECAUTIONS . If you experience chest pain or shortness of breath - call 911 immediately for transfer to the hospital emergency department.  . If you develop a fever greater that 101 F, purulent drainage from wound, increased redness or drainage from wound, foul odor from the wound/dressing, or calf pain - CONTACT YOUR SURGEON.                                                     FOLLOW-UP APPOINTMENTS Make sure you keep all of your appointments after your operation with your surgeon and caregivers. You should call the office at the above phone number and make an appointment for approximately two weeks after the date of your surgery or on the date instructed by your surgeon outlined in the "After Visit Summary".  RANGE OF MOTION AND STRENGTHENING EXERCISES  Rehabilitation of the knee is important following a knee injury or an operation. After just a few days of immobilization, the muscles of the thigh which control the knee become weakened and shrink (atrophy). Knee exercises are designed to build up the tone and strength of the thigh muscles and to improve knee motion. Often times heat used for twenty to thirty minutes before working out will loosen up your tissues and help with improving the range of motion but do not use heat for the first two weeks following surgery. These exercises can be done on a training  (exercise) mat, on the floor, on a table or on a bed. Use what ever works the best and is most comfortable for you Knee exercises include:  . Leg Lifts - While your knee is still immobilized in a splint or cast, you can do straight leg raises. Lift the leg to 60 degrees, hold for 3 sec, and slowly lower the leg. Repeat 10-20 times 2-3 times daily. Perform this exercise against resistance later as your knee gets better.  . Quad and Hamstring Sets - Tighten up the muscle on the front of the thigh (Quad) and hold for 5-10 sec. Repeat this 10-20 times hourly. Hamstring sets are done by pushing the foot backward against an object and holding for 5-10 sec. Repeat as with quad sets.   Leg Slides: Lying on your back, slowly slide your foot toward your buttocks, bending your knee up off the floor (only go as far as is comfortable). Then slowly slide your foot back down until your leg is flat on the floor again.  Angel Wings: Lying on your back spread your legs to the side as far apart as you can without causing discomfort.  A rehabilitation program following serious knee injuries can speed recovery and prevent re-injury in the future due to weakened muscles. Contact your doctor or a physical therapist for more information on knee rehabilitation.   IF YOU ARE TRANSFERRED TO A SKILLED REHAB FACILITY If the patient is transferred to a skilled rehab facility following release from the hospital, a list of the current medications will be sent to the facility for the patient to continue.  When discharged from the skilled rehab facility, please have the facility set up the patient's Home Health Physical Therapy prior to being released. Also, the skilled facility will be responsible for providing the patient with their medications at time of release from the facility to include their pain medication, the muscle relaxants, and their blood thinner medication. If the patient is still at the rehab facility at time of the two  week follow up appointment, the skilled rehab facility will also need to assist the patient in arranging follow up appointment in our office and any transportation needs.  MAKE SURE YOU:  . Understand these instructions.  . Get help right away if you are not doing well or get worse.   DENTAL ANTIBIOTICS:  In most cases prophylactic antibiotics for Dental procdeures after total joint surgery are not necessary.  Exceptions are as follows:  1. History of prior total joint infection  2. Severely immunocompromised (  Organ Transplant, cancer chemotherapy, Rheumatoid biologic meds such as Humera)  3. Poorly controlled diabetes (A1C &gt; 8.0, blood glucose over 200)  If you have one of these conditions, contact your surgeon for an antibiotic prescription, prior to your dental procedure.    Pick up stool softner and laxative for home use following surgery while on pain medications. Do not submerge incision under water. Please use good hand washing techniques while changing dressing each day. May shower starting three days after surgery. Please use a clean towel to pat the incision dry following showers. Continue to use ice for pain and swelling after surgery. Do not use any lotions or creams on the incision until instructed by your surgeon.  

## 2020-01-24 NOTE — Transfer of Care (Signed)
Immediate Anesthesia Transfer of Care Note  Patient: Melissa Velazquez  Procedure(s) Performed: TOTAL KNEE BILATERAL (Bilateral Knee)  Patient Location: PACU  Anesthesia Type:MAC and Epidural  Level of Consciousness: awake, alert , oriented and patient cooperative  Airway & Oxygen Therapy: Patient Spontanous Breathing and Patient connected to face mask oxygen  Post-op Assessment: Report given to RN and Post -op Vital signs reviewed and stable  Post vital signs: Reviewed and stable  Last Vitals:  Vitals Value Taken Time  BP 107/68 01/24/20 1416  Temp    Pulse 68 01/24/20 1419  Resp 17 01/24/20 1419  SpO2 100 % 01/24/20 1419  Vitals shown include unvalidated device data.  Last Pain:  Vitals:   01/24/20 1038  TempSrc: Oral         Complications: No complications documented.

## 2020-01-24 NOTE — Interval H&P Note (Signed)
History and Physical Interval Note:  01/24/2020 10:31 AM  Melissa Velazquez  has presented today for surgery, with the diagnosis of bilateral knee osteoarthritis.  The various methods of treatment have been discussed with the patient and family. After consideration of risks, benefits and other options for treatment, the patient has consented to  Procedure(s): TOTAL KNEE BILATERAL (Bilateral) as a surgical intervention.  The patient's history has been reviewed, patient examined, no change in status, stable for surgery.  I have reviewed the patient's chart and labs.  Questions were answered to the patient's satisfaction.     Homero Fellers Farrie Sann

## 2020-01-24 NOTE — Addendum Note (Signed)
Addendum  created 01/24/20 2113 by Gaynelle Adu, MD   Clinical Note Signed

## 2020-01-24 NOTE — Progress Notes (Signed)
Assisted Dr. Autumn Patty with epidural. Monitors on throughout procedure. See vital signs in flow sheet. Tolerated Procedure well.

## 2020-01-24 NOTE — Progress Notes (Signed)
Orthopedic Tech Progress Note Patient Details:  LEISA GAULT [birth year] [id number]  CPM Left Knee CPM Left Knee: On Left Knee Flexion (Degrees): 40 Left Knee Extension (Degrees): 10 CPM Right Knee CPM Right Knee: On Right Knee Flexion (Degrees): 40 Right Knee Extension (Degrees): 10  Post Interventions Patient Tolerated: Well Instructions Provided: Care of device  Saul Fordyce 01/24/2020, 2:52 PM

## 2020-01-24 NOTE — Anesthesia Postprocedure Evaluation (Signed)
Anesthesia Post Note  Patient: Melissa Velazquez  Procedure(s) Performed: TOTAL KNEE BILATERAL (Bilateral Knee)     Patient location during evaluation: PACU Anesthesia Type: MAC and Epidural Level of consciousness: awake and alert Pain management: pain level controlled Vital Signs Assessment: post-procedure vital signs reviewed and stable Respiratory status: spontaneous breathing, nonlabored ventilation, respiratory function stable and patient connected to nasal cannula oxygen Cardiovascular status: stable and blood pressure returned to baseline Postop Assessment: no apparent nausea or vomiting, epidural receding and patient able to bend at knees Anesthetic complications: no   No complications documented.  Last Vitals:  Vitals:   01/24/20 1626 01/24/20 1630  BP:  127/79  Pulse:  62  Resp: 19 12  Temp:    SpO2: 97% 99%    Last Pain:  Vitals:   01/24/20 1630  TempSrc:   PainSc: 0-No pain                 Saran Laviolette,W. EDMOND

## 2020-01-24 NOTE — Progress Notes (Signed)
Pt seen in her room this evening and she is having 5/10 pain in her knees with do discernable level to cold. I bolused her catheter with 15cc of 0.25% bupivicaine plain over 15 minutes and increased her infusion rate to 14cc/hr from 10. Pt does describe relief from her pain at this point.

## 2020-01-25 ENCOUNTER — Encounter (HOSPITAL_COMMUNITY): Payer: Self-pay | Admitting: Orthopedic Surgery

## 2020-01-25 LAB — CBC
HCT: 35.8 % — ABNORMAL LOW (ref 36.0–46.0)
Hemoglobin: 11.7 g/dL — ABNORMAL LOW (ref 12.0–15.0)
MCH: 31.3 pg (ref 26.0–34.0)
MCHC: 32.7 g/dL (ref 30.0–36.0)
MCV: 95.7 fL (ref 80.0–100.0)
Platelets: 144 10*3/uL — ABNORMAL LOW (ref 150–400)
RBC: 3.74 MIL/uL — ABNORMAL LOW (ref 3.87–5.11)
RDW: 13.1 % (ref 11.5–15.5)
WBC: 9.6 10*3/uL (ref 4.0–10.5)
nRBC: 0 % (ref 0.0–0.2)

## 2020-01-25 LAB — BASIC METABOLIC PANEL
Anion gap: 7 (ref 5–15)
BUN: 16 mg/dL (ref 8–23)
CO2: 28 mmol/L (ref 22–32)
Calcium: 8.7 mg/dL — ABNORMAL LOW (ref 8.9–10.3)
Chloride: 104 mmol/L (ref 98–111)
Creatinine, Ser: 1.12 mg/dL — ABNORMAL HIGH (ref 0.44–1.00)
GFR, Estimated: 54 mL/min — ABNORMAL LOW (ref >60)
Glucose, Bld: 136 mg/dL — ABNORMAL HIGH (ref 70–99)
Potassium: 4 mmol/L (ref 3.5–5.1)
Sodium: 139 mmol/L (ref 135–145)

## 2020-01-25 MED ORDER — CHLORHEXIDINE GLUCONATE CLOTH 2 % EX PADS
6.0000 | MEDICATED_PAD | Freq: Every day | CUTANEOUS | Status: DC
  Administered 2020-01-25 – 2020-01-27 (×3): 6 via TOPICAL

## 2020-01-25 NOTE — Anesthesia Post-op Follow-up Note (Signed)
  Anesthesia Pain Follow-up Note  Patient: Melissa Velazquez  Day #: 1  Date of Follow-up: 01/25/2020 Time: 1:30 PM  Last Vitals:  Vitals:   01/25/20 0439 01/25/20 0943  BP: 128/77 110/65  Pulse: 65 67  Resp: 16 16  Temp: 37 C 36.8 C  SpO2: 100% 99%    Level of Consciousness: alert  Pain: none   Side Effects:None. Significant motor weakness, states she was unable to fully participate with physical therapy earlier today due to the weakness.  Catheter Site Exam: Clean, dry, intact, non-tender.  Epidural / Intrathecal (From admission, onward)   Start     Dose/Rate Route Frequency Ordered Stop   01/24/20 1400  ropivacaine (PF) 2 mg/mL (0.2%) (NAROPIN) injection        16 mL/hr 16 mL/hr  Epidural Continuous 01/24/20 1324 01/27/20 1359       Plan: Modify therapy to reduce side effects at surgeon's request. Rate reduced to 12 mL/hr in effort to reduce motor weakness.   Beryle Lathe

## 2020-01-25 NOTE — Evaluation (Signed)
Physical Therapy Evaluation Patient Details Name: Melissa Velazquez MRN: [id number] DOB: [birth year] Today's Date: 01/25/2020   History of Present Illness  Pt s/p Bil TKR and with hx DM, CKD, and Kidney CA s/p partial nephrectomy  Clinical Impression  Pt s/p bil TKR and presents with decreased bil LE strength/ROM, balance deficits and post op pain limiting functional mobility.  Pt very cooperative and would benefit from follow up rehab at CIR level to maximize IND and safety prior to return home with limited assist.  This date, pt further limited by affects of epidural on LE strength/control and trunk control in sitting.    Follow Up Recommendations CIR    Equipment Recommendations  Rolling walker with 5" wheels;3in1 (PT)    Recommendations for Other Services OT consult     Precautions / Restrictions Precautions Precautions: Fall;Knee Restrictions Weight Bearing Restrictions: No Other Position/Activity Restrictions: WBAT      Mobility  Bed Mobility Overal bed mobility: Needs Assistance Bed Mobility: Supine to Sit;Sit to Supine     Supine to sit: Mod assist;+2 for physical assistance;+2 for safety/equipment Sit to supine: Mod assist;Max assist;+2 for physical assistance;+2 for safety/equipment   General bed mobility comments: Increased time with cues for sequence.  Physical assist to manage bil LEs and to control trunk    Transfers                 General transfer comment: NT 2* min strength bil LEs and decreased trunk control in sitting  Ambulation/Gait                Stairs            Wheelchair Mobility    Modified Rankin (Stroke Patients Only)       Balance Overall balance assessment: Needs assistance Sitting-balance support: Single extremity supported;Feet supported Sitting balance-Leahy Scale: Poor Sitting balance - Comments: Pt attempting to fall fwd from side of bed and requiring mod assist to correct and regain balance                                      Pertinent Vitals/Pain Pain Assessment: 0-10 Pain Score: 1  Pain Location: L leg - no pain R leg Pain Descriptors / Indicators: Aching Pain Intervention(s): Limited activity within patient's tolerance;Monitored during session;Premedicated before session;Other (comment) (epidural in place)    Home Living Family/patient expects to be discharged to:: Inpatient rehab Living Arrangements: Alone                    Prior Function Level of Independence: Independent               Hand Dominance        Extremity/Trunk Assessment   Upper Extremity Assessment Upper Extremity Assessment: Overall WFL for tasks assessed    Lower Extremity Assessment Lower Extremity Assessment: RLE deficits/detail;LLE deficits/detail RLE Deficits / Details: trace quads and hip flexors; no DF; AAROM to 75 flex LLE Deficits / Details: 2-/5 quads with AAROM -5 - 75    Cervical / Trunk Assessment Cervical / Trunk Assessment: Normal  Communication   Communication: No difficulties  Cognition Arousal/Alertness: Awake/alert Behavior During Therapy: WFL for tasks assessed/performed Overall Cognitive Status: Within Functional Limits for tasks assessed  General Comments      Exercises Total Joint Exercises Ankle Circles/Pumps: PROM;10 reps;Supine;Both Quad Sets: AAROM;Both;10 reps;Supine Heel Slides: AAROM;Both;10 reps;Supine Straight Leg Raises: AAROM;Both;10 reps;Supine   Assessment/Plan    PT Assessment Patient needs continued PT services  PT Problem List Decreased strength;Decreased range of motion;Decreased activity tolerance;Decreased balance;Decreased mobility;Decreased knowledge of use of DME;Pain;Obesity       PT Treatment Interventions DME instruction;Gait training;Functional mobility training;Therapeutic activities;Therapeutic exercise;Patient/family education;Balance training    PT  Goals (Current goals can be found in the Care Plan section)  Acute Rehab PT Goals Patient Stated Goal: Regain IND PT Goal Formulation: With patient Time For Goal Achievement: 02/01/20 Potential to Achieve Goals: Good    Frequency 7X/week   Barriers to discharge        Co-evaluation               AM-PAC PT "6 Clicks" Mobility  Outcome Measure Help needed turning from your back to your side while in a flat bed without using bedrails?: A Lot Help needed moving from lying on your back to sitting on the side of a flat bed without using bedrails?: A Lot Help needed moving to and from a bed to a chair (including a wheelchair)?: Total Help needed standing up from a chair using your arms (e.g., wheelchair or bedside chair)?: Total Help needed to walk in hospital room?: Total Help needed climbing 3-5 steps with a railing? : Total 6 Click Score: 8    End of Session Equipment Utilized During Treatment: Right knee immobilizer;Left knee immobilizer Activity Tolerance: Patient tolerated treatment well Patient left: in bed;with call bell/phone within reach;with bed alarm set;with nursing/sitter in room Nurse Communication: Mobility status PT Visit Diagnosis: Muscle weakness (generalized) (M62.81);Difficulty in walking, not elsewhere classified (R26.2)    Time: 1122-1203 PT Time Calculation (min) (ACUTE ONLY): 41 min   Charges:   PT Evaluation $PT Eval Low Complexity: 1 Low PT Treatments $Therapeutic Exercise: 8-22 mins $Therapeutic Activity: 8-22 mins        Mauro Kaufmann PT Acute Rehabilitation Services Pager [phone] Office [phone]   Kharter Sestak 01/25/2020, 4:46 PM

## 2020-01-25 NOTE — Progress Notes (Signed)
Inpatient Rehabilitation-Admissions Coordinator   CIR consult received. Will await therapy evaluations and recommendations.   Cheri Rous, OTR/L  Rehab Admissions Coordinator  [phone] 01/25/2020 10:31 AM

## 2020-01-25 NOTE — Progress Notes (Signed)
Pt c/o increasing pain to bilateral knees unrelieved by robaxin, 15 mg oxycodone and the bupivacaine. Notified the Anesthesiologist Dr. Sampson Goon. New orders to increase rate of epidural to 16 mls/hr and okay to give IV morphine as well. New orders implemented and will continue to monitor.

## 2020-01-25 NOTE — Progress Notes (Signed)
Orthopedic Tech Progress Note Patient Details:  Melissa Velazquez 1950/12/31 [id number]  CPM Left Knee CPM Left Knee: Off Left Knee Flexion (Degrees): 40 Left Knee Extension (Degrees): 10 CPM Right Knee CPM Right Knee: Off Right Knee Flexion (Degrees): 40 Right Knee Extension (Degrees): 10  Post Interventions Patient Tolerated: Well Instructions Provided: Care of device  Saul Fordyce 01/25/2020, 8:25 AM

## 2020-01-25 NOTE — Progress Notes (Signed)
Subjective: 1 Day Post-Op Procedure(s) (LRB): TOTAL KNEE BILATERAL (Bilateral) Patient reports pain as moderate.   Patient seen in rounds by Dr. Lequita Halt. Patient is well, and has had no acute complaints or problems. Patient denies SOB, chest pain, or calf pain. Foley catheter to remain in place. No issues overnight other than pain control. Anesthesia increased epidural rate with relief.  We will begin physical therapy today.   Objective: Vital signs in last 24 hours: Temp:  [97.7 F (36.5 C)-99.1 F (37.3 C)] 98.6 F (37 C) (01/06 0439) Pulse Rate:  [57-73] 65 (01/06 0439) Resp:  [11-24] 16 (01/06 0439) BP: (102-157)/(62-116) 128/77 (01/06 0439) SpO2:  [95 %-100 %] 100 % (01/06 0439) Weight:  [88.2 kg] 88.2 kg (01/05 2340)  Intake/Output from previous day:  Intake/Output Summary (Last 24 hours) at 01/25/2020 0802 Last data filed at 01/25/2020 0600 Gross per 24 hour  Intake 4102.42 ml  Output 2420 ml  Net 1682.42 ml     Intake/Output this shift: No intake/output data recorded.  Labs: Recent Labs    01/25/20 0417  HGB 11.7*   Recent Labs    01/25/20 0417  WBC 9.6  RBC 3.74*  HCT 35.8*  PLT 144*   Recent Labs    01/25/20 0417  NA 139  K 4.0  CL 104  CO2 28  BUN 16  CREATININE 1.12*  GLUCOSE 136*  CALCIUM 8.7*   No results for input(s): LABPT, INR in the last 72 hours.  Exam: General - Patient is Alert, Appropriate and Oriented Extremity - Neurologically intact Neurovascular intact Sensation intact distally Intact pulses distally Dorsiflexion/Plantar flexion intact Dressing - dressing C/D/I Motor Function - intact, moving foot and toes well on exam.   Past Medical History:  Diagnosis Date  . Allergic rhinitis   . Anal fissure    Hx of   . Anxiety    hx of  . Arthritis   . Asthma    none in last year   . Chronic kidney disease    partial nephrectomy  . Depression    hx of  . Diabetes mellitus without complication (HCC)    no meds  .  Diverticular disease   . Dysrhythmia    palpitations  . Eczema   . Endometrial cancer (HCC) 2001   spread to appendix   . Hemorrhoids   . Hyperglycemia   . Hypertension   . Hypothyroidism   . Kidney cancer, primary, with metastasis from kidney to other site [facility])   . Obesity   . Sebaceous cyst   . Thyroid disease   . Thyroid nodule   . 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    Assessment/Plan: 1 Day Post-Op Procedure(s) (LRB): TOTAL KNEE BILATERAL (Bilateral) Principal Problem:   OA (osteoarthritis) of knee Active Problems:   Bilateral primary osteoarthritis of knee  Estimated body mass index is 33.38 kg/m as calculated from the following:   Height as of this encounter: 5\' 4"  (1.626 m).   Weight as of this encounter: 88.2 kg. Advance diet Up with therapy  Anticipated LOS equal to or greater than 2 midnights due to - Age 74 and older with one or more of the following:  - Obesity  - Expected need for hospital services (PT, OT, Nursing) required for safe  discharge  - Anticipated need for postoperative skilled nursing care or inpatient rehab  - Active co-morbidities: None OR   - Unanticipated findings during/Post Surgery: None  - Patient is a high risk  of re-admission due to: None    DVT Prophylaxis - Aspirin and TED hose Weight bearing as tolerated. Begin physical therapy.  Plan is to go CIR after hospital stay. Foley catheter to remain in place today, will pull tomorrow six hours following epidural removal. Epidural to be removed tomorrow, anticipate increase in pain following.  Currently DVT prophylaxis regiment is 325mg  Aspirin QID, will switch to Xarelto on Saturday.  Consult for CIR placement has been submitted.   Fenton Foy, PA-C Orthopedic Surgery 01/25/2020, 8:02 AM

## 2020-01-25 NOTE — Addendum Note (Signed)
Addendum  created 01/25/20 1333 by Beryle Lathe, MD   Order list changed

## 2020-01-25 NOTE — Progress Notes (Signed)
Physical Therapy Treatment Patient Details Name: Melissa Velazquez MRN: [id number] DOB: 05/02/1950 Today's Date: 01/25/2020    History of Present Illness Pt s/p Bil TKR and with hx DM, CKD, and Kidney CA s/p partial nephrectomy    PT Comments    Pt continues very motivated but ltd by affects of epidural.  Pt with noted mild improvement in L dorsiflex and R quads and with ability to balance at bedside with supervision only but unable to perform lateral scoot and with LE strength too limited to attempt standing.  Follow Up Recommendations  CIR     Equipment Recommendations  Rolling walker with 5" wheels;3in1 (PT)    Recommendations for Other Services OT consult     Precautions / Restrictions Precautions Precautions: Fall;Knee Restrictions Weight Bearing Restrictions: No Other Position/Activity Restrictions: WBAT    Mobility  Bed Mobility Overal bed mobility: Needs Assistance Bed Mobility: Supine to Sit;Sit to Supine     Supine to sit: Mod assist;+2 for physical assistance;+2 for safety/equipment Sit to supine: Mod assist;Max assist;+2 for physical assistance;+2 for safety/equipment   General bed mobility comments: Increased time with cues for sequence.  Physical assist to manage bil LEs and to control trunk  Transfers                 General transfer comment: Lateral scoot attempted but pt unable to perform  Ambulation/Gait                 Stairs             Wheelchair Mobility    Modified Rankin (Stroke Patients Only)       Balance Overall balance assessment: Needs assistance Sitting-balance support: Single extremity supported;Feet supported Sitting balance-Leahy Scale: Poor Sitting balance - Comments: Pt attempting to fall fwd from side of bed and requiring mod assist to correct and regain balance                                    Cognition Arousal/Alertness: Awake/alert Behavior During Therapy: WFL for tasks  assessed/performed Overall Cognitive Status: Within Functional Limits for tasks assessed                                        Exercises Total Joint Exercises Ankle Circles/Pumps: PROM;10 reps;Supine;Both Quad Sets: AAROM;Both;10 reps;Supine Heel Slides: AAROM;Both;10 reps;Supine Straight Leg Raises: AAROM;Both;10 reps;Supine    General Comments        Pertinent Vitals/Pain Pain Assessment: 0-10 Pain Score: 1  Pain Location: L leg - no pain R leg Pain Descriptors / Indicators: Aching Pain Intervention(s): Limited activity within patient's tolerance;Monitored during session;Premedicated before session;Ice applied;Other (comment) (epidural)    Home Living                      Prior Function            PT Goals (current goals can now be found in the care plan section) Acute Rehab PT Goals Patient Stated Goal: Regain IND PT Goal Formulation: With patient Time For Goal Achievement: 02/01/20 Potential to Achieve Goals: Good Progress towards PT goals: Progressing toward goals    Frequency    7X/week      PT Plan Current plan remains appropriate    Co-evaluation  AM-PAC PT "6 Clicks" Mobility   Outcome Measure  Help needed turning from your back to your side while in a flat bed without using bedrails?: A Lot Help needed moving from lying on your back to sitting on the side of a flat bed without using bedrails?: A Lot Help needed moving to and from a bed to a chair (including a wheelchair)?: Total Help needed standing up from a chair using your arms (e.g., wheelchair or bedside chair)?: Total Help needed to walk in hospital room?: Total Help needed climbing 3-5 steps with a railing? : Total 6 Click Score: 8    End of Session Equipment Utilized During Treatment: Right knee immobilizer;Left knee immobilizer Activity Tolerance: Patient tolerated treatment well Patient left: in bed;with call bell/phone within reach;with bed  alarm set;with nursing/sitter in room Nurse Communication: Mobility status PT Visit Diagnosis: Muscle weakness (generalized) (M62.81);Difficulty in walking, not elsewhere classified (R26.2)     Time: [id number] PT Time Calculation (min) (ACUTE ONLY): 31 min  Charges:  $Therapeutic Exercise: 8-22 mins $Therapeutic Activity: 8-22 mins                     [place] Pager [phone] Office [phone]    Milika Ventress 01/25/2020, 4:57 PM

## 2020-01-25 NOTE — Addendum Note (Signed)
Addendum  created 01/25/20 1332 by Beryle Lathe, MD   Clinical Note Signed

## 2020-01-25 NOTE — Progress Notes (Signed)
Orthopedic Tech Progress Note Patient Details:  Melissa Velazquez 10/11/1950 [id number]  CPM Left Knee CPM Left Knee: Off Left Knee Flexion (Degrees): 40 Left Knee Extension (Degrees): 10 CPM Right Knee CPM Right Knee: Off Right Knee Flexion (Degrees): 40 Right Knee Extension (Degrees): 10  Post Interventions Patient Tolerated: Well Instructions Provided: Care of device  Melissa Velazquez 01/25/2020, 8:25 AM  

## 2020-01-26 ENCOUNTER — Inpatient Hospital Stay (HOSPITAL_COMMUNITY): Payer: Medicare Other | Admitting: Anesthesiology

## 2020-01-26 LAB — BASIC METABOLIC PANEL
Anion gap: 8 (ref 5–15)
BUN: 16 mg/dL (ref 8–23)
CO2: 26 mmol/L (ref 22–32)
Calcium: 8.6 mg/dL — ABNORMAL LOW (ref 8.9–10.3)
Chloride: 107 mmol/L (ref 98–111)
Creatinine, Ser: 0.87 mg/dL (ref 0.44–1.00)
GFR, Estimated: >60 mL/min (ref >60)
Glucose, Bld: 108 mg/dL — ABNORMAL HIGH (ref 70–99)
Potassium: 4.1 mmol/L (ref 3.5–5.1)
Sodium: 141 mmol/L (ref 135–145)

## 2020-01-26 LAB — CBC
HCT: 34.2 % — ABNORMAL LOW (ref 36.0–46.0)
Hemoglobin: 11.3 g/dL — ABNORMAL LOW (ref 12.0–15.0)
MCH: 32.3 pg (ref 26.0–34.0)
MCHC: 33 g/dL (ref 30.0–36.0)
MCV: 97.7 fL (ref 80.0–100.0)
Platelets: 111 10*3/uL — ABNORMAL LOW (ref 150–400)
RBC: 3.5 MIL/uL — ABNORMAL LOW (ref 3.87–5.11)
RDW: 13.4 % (ref 11.5–15.5)
WBC: 6.6 10*3/uL (ref 4.0–10.5)
nRBC: 0 % (ref 0.0–0.2)

## 2020-01-26 MED ORDER — ASPIRIN EC 325 MG PO TBEC
325.0000 mg | DELAYED_RELEASE_TABLET | Freq: Once | ORAL | Status: AC
  Administered 2020-01-26: 325 mg via ORAL
  Filled 2020-01-26: qty 1

## 2020-01-26 MED ORDER — POLYETHYLENE GLYCOL 3350 17 G PO PACK: 17.0000 g | PACK | Freq: Every day | ORAL | 0 refills | Status: DC | PRN

## 2020-01-26 MED ORDER — ONDANSETRON HCL 4 MG PO TABS: 4.0000 mg | ORAL_TABLET | Freq: Four times a day (QID) | ORAL | 0 refills | Status: DC | PRN

## 2020-01-26 MED ORDER — GABAPENTIN 300 MG PO CAPS: ORAL_CAPSULE | ORAL | 0 refills | Status: DC

## 2020-01-26 MED ORDER — DOCUSATE SODIUM 100 MG PO CAPS: 100.0000 mg | ORAL_CAPSULE | Freq: Two times a day (BID) | ORAL | 0 refills | Status: DC

## 2020-01-26 MED ORDER — OXYCODONE HCL 5 MG PO TABS: 5.0000 mg | ORAL_TABLET | Freq: Four times a day (QID) | ORAL | 0 refills | Status: DC | PRN

## 2020-01-26 MED ORDER — RIVAROXABAN 10 MG PO TABS: 10.0000 mg | ORAL_TABLET | Freq: Every day | ORAL | 0 refills | Status: DC

## 2020-01-26 MED ORDER — METHOCARBAMOL 500 MG PO TABS: 500.0000 mg | ORAL_TABLET | Freq: Four times a day (QID) | ORAL | 0 refills | Status: DC | PRN

## 2020-01-26 MED ORDER — RIVAROXABAN 10 MG PO TABS
10.0000 mg | ORAL_TABLET | Freq: Every day | ORAL | Status: DC
  Administered 2020-01-27 – 2020-01-30 (×4): 10 mg via ORAL
  Filled 2020-01-26 (×4): qty 1

## 2020-01-26 NOTE — Progress Notes (Cosign Needed)
   Subjective: 2 Days Post-Op Procedure(s) (LRB): TOTAL KNEE BILATERAL (Bilateral) Patient reports pain as mild.   Patient seen in rounds by Dr. Wynelle Link. Patient is well, and has had no acute complaints or problems. Pain controlled this morning. Patient eager to work with physical therapy. Denies chest pain, SOB, or calf pain. Epidural to be removed today. Plan is to go Rehab after hospital stay.  Objective: Vital signs in last 24 hours: Temp:  [98.3 F (36.8 C)-99.7 F (37.6 C)] 99.7 F (37.6 C) (01/07 0551) Pulse Rate:  [58-75] 69 (01/07 0551) Resp:  [16-18] 17 (01/07 0551) BP: (110-156)/(63-84) 154/77 (01/07 0551) SpO2:  [97 %-99 %] 97 % (01/07 0551)  Intake/Output from previous day:  Intake/Output Summary (Last 24 hours) at 01/26/2020 0714 Last data filed at 01/26/2020 0600 Gross per 24 hour  Intake 2997.32 ml  Output 1800 ml  Net 1197.32 ml    Intake/Output this shift: No intake/output data recorded.  Labs: Recent Labs    01/25/20 0417 01/26/20 0353  HGB 11.7* 11.3*   Recent Labs    01/25/20 0417 01/26/20 0353  WBC 9.6 6.6  RBC 3.74* 3.50*  HCT 35.8* 34.2*  PLT 144* 111*   Recent Labs    01/25/20 0417 01/26/20 0353  NA 139 141  K 4.0 4.1  CL 104 107  CO2 28 26  BUN 16 16  CREATININE 1.12* 0.87  GLUCOSE 136* 108*  CALCIUM 8.7* 8.6*   No results for input(s): LABPT, INR in the last 72 hours.  Exam: General - Patient is Alert, Appropriate and Oriented Extremity - Neurologically intact Neurovascular intact Sensation intact distally Intact pulses distally Dorsiflexion/Plantar flexion intact Dressing/Incision - clean, dry, no drainage, Aquacel dressings in place bilaterally.  Motor Function - intact, moving foot and toes well on exam.  Past Medical History:  Diagnosis Date  . Allergic rhinitis   . Anal fissure    Hx of   . Anxiety    hx of  . Arthritis   . Asthma    none in last year   . Chronic kidney disease    partial nephrectomy  .  Depression    hx of  . Diabetes mellitus without complication (HCC)    no meds  . Diverticular disease   . Dysrhythmia    palpitations  . Eczema   . Endometrial cancer ([place]) 2001   spread to appendix   . Hemorrhoids   . Hyperglycemia   . Hypertension   . Hypothyroidism   . Kidney cancer, primary, with metastasis from kidney to other site [facility])   . Obesity   . Sebaceous cyst   . Thyroid disease   . Thyroid nodule   . 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    Assessment/Plan: 2 Days Post-Op Procedure(s) (LRB): TOTAL KNEE BILATERAL (Bilateral) Principal Problem:   OA (osteoarthritis) of knee Active Problems:   Bilateral primary osteoarthritis of knee  Estimated body mass index is 33.38 kg/m as calculated from the following:   Height as of this encounter: 5\' 4"  (1.626 m).   Weight as of this encounter: 88.2 kg. Up with therapy  DVT Prophylaxis - Aspirin Weight-bearing as tolerated  Epidural to be removed today, foley catheter can be removed six hours following. Anticipate increase in pain. Will switch to Xarelto tomorrow, 10mg  QID.   Hopeful for transfer to CIR either tomorrow or Monday, pending insurance approval.   Fenton Foy, PA-C Orthopedic Surgery [phone] 01/26/2020, 7:14 AM

## 2020-01-26 NOTE — Anesthesia Post-op Follow-up Note (Signed)
  Anesthesia Pain Follow-up Note  Patient: Melissa Velazquez  Day #: 2 Date of Follow-up: 01/26/2020 Time: 1:16 PM  Last Vitals:  Vitals:   01/26/20 0551 01/26/20 1155  BP: (!) 154/77 123/79  Pulse: 69 75  Resp: 17 18  Temp: 37.6 C 36.9 C  SpO2: 97% 93%    Level of Consciousness: alert  Pain: mild   Side Effects:None  Catheter Site Exam:clean, dry, no drainage  Anti-Coag Meds (From admission, onward)   Start     Dose/Rate Route Frequency Ordered Stop   01/27/20 1000  rivaroxaban (XARELTO) tablet 10 mg        10 mg Oral Daily 01/26/20 0721     01/27/20 0000  rivaroxaban (XARELTO) 10 MG TABS tablet        10 mg Oral Daily 01/26/20 0729      Epidural / Intrathecal (From admission, onward)   Start     Dose/Rate Route Frequency Ordered Stop   01/24/20 1400  ropivacaine (PF) 2 mg/mL (0.2%) (NAROPIN) injection        12 mL/hr 12 mL/hr  Epidural Continuous 01/24/20 1324 01/27/20 1359       Plan: Catheter removed/tip intact at surgeon's request, D/C Infusion at surgeon's request and D/C from anesthesia care at surgeon's request  Catalina Gravel

## 2020-01-26 NOTE — Progress Notes (Signed)
Inpatient Rehab Admissions Coordinator:   I attempted to call Pt. But was unable to reach her. I do not have a CIR bed for her today or over the weekend, but per chart, epidural to be removed today and PT is recommending CIR. Will pursue for potential admit early next week pending medical readiness and bed availability.   Clemens Catholic, [place], [place] Admissions Coordinator  [phone] ([place]) [phone] (office)

## 2020-01-26 NOTE — Evaluation (Signed)
Occupational Therapy Evaluation Patient Details Name: Melissa Velazquez MRN: [id number] DOB: Sep 04, 1950 Today's Date: 01/26/2020    History of Present Illness Pt s/p Bil TKR and with hx DM, CKD, and Kidney CA s/p partial nephrectomy   Clinical Impression   [name] is a 70 year old woman s/p bilateral knee replacement who presents with decreased ROM and strength of bilateral LE's, decreased balance and  Complaints of pain resulting in sudden decline in ability to ambulate and perform ADLs. Patient mod x 2 stand, +2 assistance to take steps back to bed with RW, and return to bed. Patient total assist for toileting and LB dressing. Patient will benefit from skilled OT services while in hospital to improve deficits and learn compensatory strategies as needed in order to return PLOF.      Follow Up Recommendations  CIR    Equipment Recommendations  None recommended by OT    Recommendations for Other Services       Precautions / Restrictions Precautions Precautions: Fall;Knee Restrictions Weight Bearing Restrictions: No Other Position/Activity Restrictions: WBAT      Mobility Bed Mobility Overal bed mobility: Needs Assistance Bed Mobility: Sit to Supine       Sit to supine: Mod assist;+2 for physical assistance;+2 for safety/equipment   General bed mobility comments: cues for sequence with assist to manage bil LEs and to control trunk    Transfers Overall transfer level: Needs assistance Equipment used: Rolling walker (2 wheeled) Transfers: Sit to/from [facility] Sit to Stand: Mod assist;Max assist;+2 physical assistance;+2 safety/equipment Stand pivot transfers: Mod assist;+2 physical assistance;+2 safety/equipment;From elevated surface       General transfer comment: Assist to bring wt up and fwd and to balance in standing.  Stand/pvt to bed with RW, cues for sequence and physical assist for balance/support and to manage RW; Pt with noted  difficulty managing R LE    Balance Overall balance assessment: Needs assistance Sitting-balance support: No upper extremity supported;Feet supported Sitting balance-Leahy Scale: Fair Sitting balance - Comments: Pt attempting to fall fwd from side of bed and requiring mod assist to correct and regain balance   Standing balance support: Bilateral upper extremity supported Standing balance-Leahy Scale: Poor                             ADL either performed or assessed with clinical judgement   ADL Overall ADL's : Needs assistance/impaired Eating/Feeding: Independent   Grooming: Set up;Sitting   Upper Body Bathing: Set up;Sitting   Lower Body Bathing: Maximal assistance;Set up;Sitting/lateral leans   Upper Body Dressing : Set up;Sitting   Lower Body Dressing: Total assistance;Sit to/from stand;+2 for safety/equipment;+2 for physical assistance   Toilet Transfer: +2 for safety/equipment;+2 for physical assistance;Minimal assistance;BSC;Stand-pivot;RW   Toileting- Clothing Manipulation and Hygiene: Total assistance;Sit to/from stand               Vision Patient Visual Report: No change from baseline       Perception     Praxis      Pertinent Vitals/Pain Pain Assessment: 0-10 Pain Score: 4  Pain Location: L leg greater than R Pain Descriptors / Indicators: Aching Pain Intervention(s): Limited activity within patient's tolerance;Monitored during session     Hand Dominance     Extremity/Trunk Assessment Upper Extremity Assessment Upper Extremity Assessment: Overall WFL for tasks assessed   Lower Extremity Assessment Lower Extremity Assessment: Defer to PT evaluation  Communication Communication Communication: No difficulties   Cognition Arousal/Alertness: Awake/alert Behavior During Therapy: WFL for tasks assessed/performed Overall Cognitive Status: Within Functional Limits for tasks assessed                                      General Comments       Exercises     Shoulder Instructions      Home Living Family/patient expects to be discharged to:: Inpatient rehab Living Arrangements: Alone                                      Prior Functioning/Environment Level of Independence: Independent                 OT Problem List: Decreased strength;Decreased range of motion;Decreased activity tolerance;Impaired balance (sitting and/or standing);Decreased knowledge of use of DME or AE;Pain;Obesity      OT Treatment/Interventions: Self-care/ADL training;Therapeutic exercise;DME and/or AE instruction;Therapeutic activities;Patient/family education;Balance training    OT Goals(Current goals can be found in the care plan section) Acute Rehab OT Goals Patient Stated Goal: Regain IND OT Goal Formulation: With patient Time For Goal Achievement: 02/09/20 Potential to Achieve Goals: Good  OT Frequency: Min 2X/week   Barriers to D/C:            Co-evaluation PT/OT/SLP Co-Evaluation/Treatment: Yes Reason for Co-Treatment: For patient/therapist safety;To address functional/ADL transfers          AM-PAC OT "6 Clicks" Daily Activity     Outcome Measure Help from another person eating meals?: None Help from another person taking care of personal grooming?: A Little Help from another person toileting, which includes using toliet, bedpan, or urinal?: Total Help from another person bathing (including washing, rinsing, drying)?: A Lot Help from another person to put on and taking off regular upper body clothing?: A Little Help from another person to put on and taking off regular lower body clothing?: Total 6 Click Score: 14   End of Session Equipment Utilized During Treatment: Gait belt;Rolling walker CPM Left Knee CPM Left Knee: On Nurse Communication: Mobility status  Activity Tolerance: Patient tolerated treatment well Patient left: in bed;with call bell/phone within reach;with  bed alarm set  OT Visit Diagnosis: Unsteadiness on feet (R26.81);Other abnormalities of gait and mobility (R26.89);Pain                Time: 4827-0786 OT Time Calculation (min): 1400 min Charges:  OT General Charges $OT Visit: 1 Visit OT Evaluation $OT Eval Moderate Complexity: 1 Mod  Donat Humble, OTR/L [place]  Office [phone] Pager: [place] 01/26/2020, 5:32 PM

## 2020-01-26 NOTE — Addendum Note (Signed)
Addendum  created 01/26/20 1318 by Catalina Gravel, MD   [facility] properties accepted

## 2020-01-26 NOTE — Progress Notes (Signed)
Physical Therapy Treatment Patient Details Name: Melissa Velazquez MRN: [id number] DOB: 1950-12-08 Today's Date: 01/26/2020    History of Present Illness Pt s/p Bil TKR and with hx DM, CKD, and Kidney CA s/p partial nephrectomy    PT Comments    Despite epidural still in place, pt with noted improvement in active movement bil LEs and progressed to stand/pvt bed to chair with RW and assist of 2.     Follow Up Recommendations  CIR     Equipment Recommendations  Rolling walker with 5" wheels;3in1 (PT)    Recommendations for Other Services OT consult     Precautions / Restrictions Precautions Precautions: Fall;Knee Restrictions Weight Bearing Restrictions: No Other Position/Activity Restrictions: WBAT    Mobility  Bed Mobility Overal bed mobility: Needs Assistance Bed Mobility: Supine to Sit     Supine to sit: Mod assist;+2 for physical assistance;+2 for safety/equipment     General bed mobility comments: cues for sequence with assist to manage bil LEs and to bring trunk to upright  Transfers Overall transfer level: Needs assistance Equipment used: Rolling walker (2 wheeled) Transfers: Sit to/from [employer] Transfers Sit to Stand: From elevated surface;Mod assist;Max assist;+2 physical assistance;+2 safety/equipment Stand pivot transfers: Mod assist;Max assist;+2 physical assistance;+2 safety/equipment;From elevated surface       General transfer comment: Extensive use of bed to elevate pt; physical assist to bring wt up and fwd and to balance in standing.  Cues for sequence and to increase UE WB for stand pvt to chair with RW  Ambulation/Gait             General Gait Details: Stand pvt only   Stairs             Wheelchair Mobility    Modified Rankin (Stroke Patients Only)       Balance Overall balance assessment: Needs assistance Sitting-balance support: No upper extremity supported;Feet supported Sitting balance-Leahy Scale: Fair      Standing balance support: Bilateral upper extremity supported Standing balance-Leahy Scale: Poor                              Cognition Arousal/Alertness: Awake/alert Behavior During Therapy: WFL for tasks assessed/performed Overall Cognitive Status: Within Functional Limits for tasks assessed                                        Exercises Total Joint Exercises Ankle Circles/Pumps: AROM;Both;15 reps;Supine Quad Sets: AAROM;Both;10 reps;Supine Heel Slides: AAROM;Both;Supine;15 reps Straight Leg Raises: AAROM;Both;10 reps;Supine Goniometric ROM: AAROM R knee 0 - 90; L knee -5 - 75    General Comments        Pertinent Vitals/Pain Pain Assessment: 0-10 Pain Score: 3  Pain Location: L leg greater than R Pain Descriptors / Indicators: Aching Pain Intervention(s): Limited activity within patient's tolerance;Monitored during session;Premedicated before session;Ice applied    Home Living                      Prior Function            PT Goals (current goals can now be found in the care plan section) Acute Rehab PT Goals Patient Stated Goal: Regain IND PT Goal Formulation: With patient Time For Goal Achievement: 02/01/20 Potential to Achieve Goals: Good Progress towards PT goals: Progressing toward goals    Frequency  7X/week      PT Plan Current plan remains appropriate    Co-evaluation              AM-PAC PT "6 Clicks" Mobility   Outcome Measure  Help needed turning from your back to your side while in a flat bed without using bedrails?: A Lot Help needed moving from lying on your back to sitting on the side of a flat bed without using bedrails?: A Lot Help needed moving to and from a bed to a chair (including a wheelchair)?: A Lot Help needed standing up from a chair using your arms (e.g., wheelchair or bedside chair)?: A Lot Help needed to walk in hospital room?: A Lot Help needed climbing 3-5 steps with a  railing? : Total 6 Click Score: 11    End of Session Equipment Utilized During Treatment: Right knee immobilizer;Left knee immobilizer Activity Tolerance: Patient tolerated treatment well Patient left: in chair;with call bell/phone within reach;with chair alarm set Nurse Communication: Mobility status PT Visit Diagnosis: Muscle weakness (generalized) (M62.81);Difficulty in walking, not elsewhere classified (R26.2)     Time: 1010-1046 PT Time Calculation (min) (ACUTE ONLY): 36 min  Charges:  $Therapeutic Exercise: 8-22 mins $Therapeutic Activity: 8-22 mins                     Debe Coder PT Acute Rehabilitation Services Pager [phone] Office [phone]    Caelan Atchley 01/26/2020, 12:26 PM

## 2020-01-26 NOTE — Plan of Care (Signed)
Plan of care reviewed and discussed with the patient. 

## 2020-01-26 NOTE — Progress Notes (Signed)
Physical Therapy Treatment Patient Details Name: Melissa Velazquez MRN: [id number] DOB: 28-Jan-1950 Today's Date: 01/26/2020    History of Present Illness Pt s/p Bil TKR and with hx DM, CKD, and Kidney CA s/p partial nephrectomy    PT Comments    Pt continues very cooperative but limited by fatigue and affect of epidural (most notably on R LE).  Epidural removed by anesthesiology during this session.   Follow Up Recommendations  CIR     Equipment Recommendations  Rolling walker with 5" wheels;3in1 (PT)    Recommendations for Other Services OT consult     Precautions / Restrictions Precautions Precautions: Fall;Knee Restrictions Weight Bearing Restrictions: No Other Position/Activity Restrictions: WBAT    Mobility  Bed Mobility Overal bed mobility: Needs Assistance Bed Mobility: Sit to Supine       Sit to supine: Mod assist;+2 for physical assistance;+2 for safety/equipment   General bed mobility comments: cues for sequence with assist to manage bil LEs and to control trunk  Transfers Overall transfer level: Needs assistance Equipment used: Rolling walker (2 wheeled) Transfers: Sit to/from Stand;Stand Pivot Transfers Sit to Stand: From elevated surface;Mod assist;Max assist;+2 physical assistance;+2 safety/equipment Stand pivot transfers: Mod assist;+2 physical assistance;+2 safety/equipment;From elevated surface       General transfer comment: Assist to bring wt up and fwd and to balance in standing.  Stand/pvt to bed with RW, cues for sequence and physical assist for balance/support and to manage RW; Pt with noted difficulty managing R LE  Ambulation/Gait             General Gait Details: Stand pvt only   Stairs             Wheelchair Mobility    Modified Rankin (Stroke Patients Only)       Balance Overall balance assessment: Needs assistance Sitting-balance support: No upper extremity supported;Feet supported Sitting balance-Leahy Scale:  Fair     Standing balance support: Bilateral upper extremity supported Standing balance-Leahy Scale: Poor                              Cognition Arousal/Alertness: Awake/alert Behavior During Therapy: WFL for tasks assessed/performed Overall Cognitive Status: Within Functional Limits for tasks assessed                                        Exercises      General Comments        Pertinent Vitals/Pain Pain Assessment: 0-10 Pain Score: 4  Pain Location: L leg greater than R Pain Descriptors / Indicators: Aching Pain Intervention(s): Limited activity within patient's tolerance;Monitored during session;Patient requesting pain meds-RN notified;Ice applied;Other (comment) (epidural removed at beginning of session)    Home Living                      Prior Function            PT Goals (current goals can now be found in the care plan section) Acute Rehab PT Goals Patient Stated Goal: Regain IND PT Goal Formulation: With patient Time For Goal Achievement: 02/01/20 Potential to Achieve Goals: Good Progress towards PT goals: Progressing toward goals    Frequency    7X/week      PT Plan Current plan remains appropriate    Co-evaluation  AM-PAC PT "6 Clicks" Mobility   Outcome Measure  Help needed turning from your back to your side while in a flat bed without using bedrails?: A Lot Help needed moving from lying on your back to sitting on the side of a flat bed without using bedrails?: A Lot Help needed moving to and from a bed to a chair (including a wheelchair)?: A Lot Help needed standing up from a chair using your arms (e.g., wheelchair or bedside chair)?: A Lot Help needed to walk in hospital room?: A Lot Help needed climbing 3-5 steps with a railing? : Total 6 Click Score: 11    End of Session Equipment Utilized During Treatment: Right knee immobilizer;Left knee immobilizer Activity Tolerance: Patient  tolerated treatment well Patient left: in bed;with call bell/phone within reach;with bed alarm set Nurse Communication: Mobility status PT Visit Diagnosis: Muscle weakness (generalized) (M62.81);Difficulty in walking, not elsewhere classified (R26.2)     Time: 9390-3009 PT Time Calculation (min) (ACUTE ONLY): 25 min  Charges:  $Therapeutic Activity: 8-22 mins                     Debe Coder PT Acute Rehabilitation Services Pager [phone] Office [phone]    Chealsea Paske 01/26/2020, 3:11 PM

## 2020-01-27 LAB — CBC
HCT: 33.9 % — ABNORMAL LOW (ref 36.0–46.0)
Hemoglobin: 10.9 g/dL — ABNORMAL LOW (ref 12.0–15.0)
MCH: 31.7 pg (ref 26.0–34.0)
MCHC: 32.2 g/dL (ref 30.0–36.0)
MCV: 98.5 fL (ref 80.0–100.0)
Platelets: 106 10*3/uL — ABNORMAL LOW (ref 150–400)
RBC: 3.44 MIL/uL — ABNORMAL LOW (ref 3.87–5.11)
RDW: 13.6 % (ref 11.5–15.5)
WBC: 7.9 10*3/uL (ref 4.0–10.5)
nRBC: 0 % (ref 0.0–0.2)

## 2020-01-27 MED ORDER — TRAMADOL HCL 50 MG PO TABS
50.0000 mg | ORAL_TABLET | Freq: Four times a day (QID) | ORAL | Status: DC | PRN
  Administered 2020-01-27: 50 mg via ORAL
  Administered 2020-01-27 (×2): 100 mg via ORAL
  Administered 2020-01-28 (×2): 50 mg via ORAL
  Administered 2020-01-30 (×2): 100 mg via ORAL
  Filled 2020-01-27 (×2): qty 2
  Filled 2020-01-27 (×2): qty 1
  Filled 2020-01-27: qty 2
  Filled 2020-01-27: qty 1
  Filled 2020-01-27: qty 2

## 2020-01-27 NOTE — Progress Notes (Signed)
Physical Therapy Treatment Patient Details Name: Melissa Velazquez MRN: [id number] DOB: 1950/01/24 Today's Date: 01/27/2020    History of Present Illness Pt s/p Bil TKR and with hx DM, CKD, and Kidney CA s/p partial nephrectomy    PT Comments    Pt very motivated and with steady progress noted.   Follow Up Recommendations  CIR     Equipment Recommendations  Rolling walker with 5" wheels;3in1 (PT)    Recommendations for Other Services OT consult     Precautions / Restrictions Precautions Precautions: Fall;Knee Restrictions Weight Bearing Restrictions: No Other Position/Activity Restrictions: WBAT    Mobility  Bed Mobility Overal bed mobility: Needs Assistance Bed Mobility: Supine to Sit;Sit to Supine     Supine to sit: Min assist;Mod assist Sit to supine: Mod assist   General bed mobility comments: cues for sequence with assist to manage bil LEs  Transfers Overall transfer level: Needs assistance Equipment used: Rolling walker (2 wheeled) Transfers: Sit to/from Stand Sit to Stand: Mod assist;From elevated surface         General transfer comment: Use of bed to assist; cues for LE management and use of UEs to self assist; physical assist to bring wt up and fwd and to balance in standing  Ambulation/Gait Ambulation/Gait assistance: Min assist Gait Distance (Feet): 26 Feet (twice) Assistive device: Rolling walker (2 wheeled) Gait Pattern/deviations: Step-to pattern;Decreased step length - right;Decreased step length - left;Shuffle;Trunk flexed Gait velocity: decr   General Gait Details: cues for posture, sequence, position from [employer]             Wheelchair Mobility    Modified Rankin (Stroke Patients Only)       Balance Overall balance assessment: Needs assistance Sitting-balance support: No upper extremity supported;Feet supported Sitting balance-Leahy Scale: Good Sitting balance - Comments: Pt attempting to fall fwd from side of bed and  requiring mod assist to correct and regain balance   Standing balance support: Bilateral upper extremity supported Standing balance-Leahy Scale: Poor                              Cognition Arousal/Alertness: Awake/alert Behavior During Therapy: WFL for tasks assessed/performed Overall Cognitive Status: Within Functional Limits for tasks assessed                                        Exercises      General Comments        Pertinent Vitals/Pain Pain Assessment: 0-10 Pain Score: 4  Pain Location: L leg greater than R Pain Descriptors / Indicators: Aching;Sore Pain Intervention(s): Limited activity within patient's tolerance;Monitored during session;Premedicated before session;Ice applied    Home Living                      Prior Function            PT Goals (current goals can now be found in the care plan section) Acute Rehab PT Goals Patient Stated Goal: Regain IND PT Goal Formulation: With patient Time For Goal Achievement: 02/01/20 Potential to Achieve Goals: Good Progress towards PT goals: Progressing toward goals    Frequency    7X/week      PT Plan Current plan remains appropriate    Co-evaluation              AM-PAC  PT "6 Clicks" Mobility   Outcome Measure  Help needed turning from your back to your side while in a flat bed without using bedrails?: A Lot Help needed moving from lying on your back to sitting on the side of a flat bed without using bedrails?: A Lot Help needed moving to and from a bed to a chair (including a wheelchair)?: A Lot Help needed standing up from a chair using your arms (e.g., wheelchair or bedside chair)?: A Lot Help needed to walk in hospital room?: A Lot Help needed climbing 3-5 steps with a railing? : Total 6 Click Score: 11    End of Session Equipment Utilized During Treatment: Right knee immobilizer;Left knee immobilizer Activity Tolerance: Patient tolerated treatment  well Patient left: in bed;with call bell/phone within reach;with family/visitor present Nurse Communication: Mobility status PT Visit Diagnosis: Muscle weakness (generalized) (M62.81);Difficulty in walking, not elsewhere classified (R26.2)     Time: 6962-9528 PT Time Calculation (min) (ACUTE ONLY): 28 min  Charges:  $Gait Training: 23-37 mins                     [place] Pager [phone] Office [phone]    Aneka Fagerstrom 01/27/2020, 5:08 PM

## 2020-01-27 NOTE — Progress Notes (Signed)
Physical Therapy Treatment Patient Details Name: Melissa Velazquez MRN: [id number] DOB: [birth year] Today's Date: 01/27/2020    History of Present Illness Pt s/p Bil TKR and with hx DM, CKD, and Kidney CA s/p partial nephrectomy    PT Comments    Pt not with epidural out and noted improvement in LE control, balance and activity tolerance.   Follow Up Recommendations  CIR     Equipment Recommendations  Rolling walker with 5" wheels;3in1 (PT)    Recommendations for Other Services OT consult     Precautions / Restrictions Precautions Precautions: Fall;Knee Restrictions Weight Bearing Restrictions: No Other Position/Activity Restrictions: WBAT    Mobility  Bed Mobility Overal bed mobility: Needs Assistance Bed Mobility: Supine to Sit     Supine to sit: Mod assist     General bed mobility comments: cues for sequence with assist to manage bil LEs  Transfers Overall transfer level: Needs assistance Equipment used: Rolling walker (2 wheeled) Transfers: Sit to/from Stand Sit to Stand: Mod assist;From elevated surface         General transfer comment: Use of bed to assist; cues for LE management and use of UEs to self assist; physical assist to bring wt up and fwd and to balance in standing  Ambulation/Gait Ambulation/Gait assistance: Min assist;Mod assist Gait Distance (Feet): 21 Feet Assistive device: Rolling walker (2 wheeled) Gait Pattern/deviations: Step-to pattern;Decreased step length - right;Decreased step length - left;Shuffle;Trunk flexed Gait velocity: decr   General Gait Details: cues for posture, sequence, position from [employer]             Wheelchair Mobility    Modified Rankin (Stroke Patients Only)       Balance Overall balance assessment: Needs assistance Sitting-balance support: No upper extremity supported;Feet supported Sitting balance-Leahy Scale: Good     Standing balance support: Bilateral upper extremity supported Standing  balance-Leahy Scale: Poor                              Cognition Arousal/Alertness: Awake/alert Behavior During Therapy: WFL for tasks assessed/performed Overall Cognitive Status: Within Functional Limits for tasks assessed                                        Exercises Total Joint Exercises Ankle Circles/Pumps: AROM;Both;15 reps;Supine Quad Sets: AAROM;Both;10 reps;Supine Heel Slides: AAROM;Both;Supine;15 reps Straight Leg Raises: AAROM;Both;10 reps;Supine Goniometric ROM: AAROM bil knees -5 - 70    General Comments        Pertinent Vitals/Pain Pain Assessment: 0-10 Pain Score: 4  Pain Location: L leg greater than R Pain Descriptors / Indicators: Aching;Sore Pain Intervention(s): Limited activity within patient's tolerance;Monitored during session;Premedicated before session;Ice applied    Home Living                      Prior Function            PT Goals (current goals can now be found in the care plan section) Acute Rehab PT Goals Patient Stated Goal: Regain IND PT Goal Formulation: With patient Time For Goal Achievement: 02/01/20 Potential to Achieve Goals: Good Progress towards PT goals: Progressing toward goals    Frequency    7X/week      PT Plan Current plan remains appropriate    Co-evaluation  AM-PAC PT "6 Clicks" Mobility   Outcome Measure  Help needed turning from your back to your side while in a flat bed without using bedrails?: A Lot Help needed moving from lying on your back to sitting on the side of a flat bed without using bedrails?: A Lot Help needed moving to and from a bed to a chair (including a wheelchair)?: A Lot Help needed standing up from a chair using your arms (e.g., wheelchair or bedside chair)?: A Lot Help needed to walk in hospital room?: A Lot Help needed climbing 3-5 steps with a railing? : Total 6 Click Score: 11    End of Session Equipment Utilized During  Treatment: Right knee immobilizer;Left knee immobilizer Activity Tolerance: Patient tolerated treatment well Patient left: in chair;with call bell/phone within reach;with chair alarm set Nurse Communication: Mobility status PT Visit Diagnosis: Muscle weakness (generalized) (M62.81);Difficulty in walking, not elsewhere classified (R26.2)     Time: 0820-0900 PT Time Calculation (min) (ACUTE ONLY): 40 min  Charges:  $Gait Training: 8-22 mins $Therapeutic Exercise: 8-22 mins $Therapeutic Activity: 8-22 mins                     Debe Coder PT Acute Rehabilitation Services Pager [phone] Office [phone]    [facility] 01/27/2020, 12:39 PM

## 2020-01-27 NOTE — Plan of Care (Signed)
  Problem: Skin Integrity: Goal: Will show signs of wound healing Outcome: Progressing   Problem: Clinical Measurements: Goal: Will remain free from infection Outcome: Progressing   Problem: Clinical Measurements: Goal: Diagnostic test results will improve Outcome: Progressing   Problem: Skin Integrity: Goal: Risk for impaired skin integrity will decrease Outcome: Progressing   Problem: Pain Management: Goal: Pain level will decrease with appropriate interventions Outcome: Progressing

## 2020-01-27 NOTE — Progress Notes (Signed)
Melissa Velazquez  MRN: [id number] DOB/Age: [birth year] 70 y.o. Physician: Ander Slade, Melissa.D. 3 Days Post-Op Procedure(s) (LRB): TOTAL KNEE BILATERAL (Bilateral)  Subjective: Patient resting comfortably in bed this a.Melissa.  Reports good appetite.  Denies nausea or vomiting.  Reports that her pain has been reasonably well controlled, currently taking oxycodone every 3 hours 10 mg Vital Signs Temp:  [98.4 F (36.9 C)-98.9 F (37.2 C)] 98.6 F (37 C) (01/08 0610) Pulse Rate:  [75-87] 84 (01/08 0610) Resp:  [16-18] 16 (01/08 0610) BP: (123-140)/(71-79) 136/74 (01/08 0610) SpO2:  [93 %-99 %] 96 % (01/08 0610)  Lab Results Recent Labs    01/26/20 0353 01/27/20 0328  WBC 6.6 7.9  HGB 11.3* 10.9*  HCT 34.2* 33.9*  PLT 111* 106*   BMET Recent Labs    01/25/20 0417 01/26/20 0353  NA 139 141  K 4.0 4.1  CL 104 107  CO2 28 26  GLUCOSE 136* 108*  BUN 16 16  CREATININE 1.12* 0.87  CALCIUM 8.7* 8.6*   INR  Date Value Ref Range Status  01/18/2020 1.0 0.8 - 1.2 Final    Comment:    (NOTE) INR goal varies based on device and disease states. Performed at [facility], [place] [address], [place], [place] [address]      Exam  Bilateral knee immobilizers are in place.  She continues with good ankle dorsi and plantar flexion mobility and strength.  Mobility remains significantly impaired requiring max assist for sit to stand transfers  Impression:  Status post bilateral total knee arthroplasties.  Plan Mobilizing with PT and OT per postop protocol.  [facility] pending with disposition to be determined  I have discussed with Melissa Velazquez the importance of tapering down her narcotic medications.  I have spoken with nursing staff and we will try to switch to tramadol during the day and reserve oxycodone for nightly in hopes that we can taper down the use of narcotics. Melissa Velazquez Melissa Velazquez 69/08/2020, 9:24 AM   Contact # [phone]

## 2020-01-27 NOTE — Progress Notes (Signed)
Orthopedic Tech Progress Note Patient Details:  Melissa Velazquez Mar 25, 1950 [id number]  Patient ID: Melissa Velazquez, female   DOB: July 27, 1950, 70 y.o.   MRN: [id number]   Melissa Velazquez 01/27/2020, 2:29 PM cpm applied to left leg @1430 

## 2020-01-28 NOTE — Progress Notes (Signed)
Orthopedic Tech Progress Note Patient Details:  Melissa Velazquez 1950/06/06 [id number]  Patient ID: [name], female   DOB: 10-25-1950, 70 y.o.   MRN: [id number]   [name] 01/28/2020, 7:01 PM Patient placed in cpm @1900 

## 2020-01-28 NOTE — Progress Notes (Signed)
Orthopedic Tech Progress Note Patient Details:  Melissa Velazquez 1950-04-19 [id number]  Patient ID: [name], female   DOB: July 16, 1950, 70 y.o.   MRN: [id number]   [name] 01/28/2020, 8:17 PM cpm removed from left leg @2015 

## 2020-01-28 NOTE — Progress Notes (Signed)
Physical Therapy Treatment Patient Details Name: Melissa Velazquez MRN: [id number] DOB: May 10, 1950 Today's Date: 01/28/2020    History of Present Illness Pt s/p Bil TKR and with hx DM, CKD, and Kidney CA s/p partial nephrectomy    PT Comments    Pt continues very motivated and progressing steadily with mobility.  This pm, pt ambulating without L KI for increased distance and requiring decreasing level of assist for all tasks.   Follow Up Recommendations  CIR     Equipment Recommendations  Rolling walker with 5" wheels;3in1 (PT)    Recommendations for Other Services OT consult     Precautions / Restrictions Precautions Precautions: Fall;Knee Restrictions Weight Bearing Restrictions: No Other Position/Activity Restrictions: WBAT    Mobility  Bed Mobility Overal bed mobility: Needs Assistance Bed Mobility: Supine to Sit;Sit to Supine     Supine to sit: Min assist Sit to supine: Mod assist   General bed mobility comments: cues for sequence with assist to manage bil LEs  Transfers Overall transfer level: Needs assistance Equipment used: Rolling walker (2 wheeled) Transfers: Sit to/from Stand Sit to Stand: Min assist;Mod assist;From elevated surface         General transfer comment: Use of bed to assist; cues for LE management and use of UEs to self assist; physical assist to bring wt up and fwd and to balance in standing  Ambulation/Gait Ambulation/Gait assistance: Min assist Gait Distance (Feet): 38 Feet (38' twice) Assistive device: Rolling walker (2 wheeled) Gait Pattern/deviations: Step-to pattern;Decreased step length - right;Decreased step length - left;Shuffle;Trunk flexed Gait velocity: decr   General Gait Details: cues for posture, sequence, position from [employer]             Wheelchair Mobility    Modified Rankin (Stroke Patients Only)       Balance Overall balance assessment: Needs assistance Sitting-balance support: No upper  extremity supported;Feet supported Sitting balance-Leahy Scale: Good     Standing balance support: Bilateral upper extremity supported Standing balance-Leahy Scale: Poor                              Cognition Arousal/Alertness: Awake/alert Behavior During Therapy: WFL for tasks assessed/performed Overall Cognitive Status: Within Functional Limits for tasks assessed                                        Exercises      General Comments        Pertinent Vitals/Pain Pain Assessment: 0-10 Pain Score: 4  Pain Location: bil knees Pain Descriptors / Indicators: Aching;Sore Pain Intervention(s): Limited activity within patient's tolerance;Monitored during session;Premedicated before session;Ice applied    Home Living                      Prior Function            PT Goals (current goals can now be found in the care plan section) Acute Rehab PT Goals Patient Stated Goal: Regain IND PT Goal Formulation: With patient Time For Goal Achievement: 02/01/20 Potential to Achieve Goals: Good Progress towards PT goals: Progressing toward goals    Frequency    7X/week      PT Plan Current plan remains appropriate    Co-evaluation              AM-PAC PT "  6 Clicks" Mobility   Outcome Measure  Help needed turning from your back to your side while in a flat bed without using bedrails?: A Lot Help needed moving from lying on your back to sitting on the side of a flat bed without using bedrails?: A Little Help needed moving to and from a bed to a chair (including a wheelchair)?: A Lot Help needed standing up from a chair using your arms (e.g., wheelchair or bedside chair)?: A Lot Help needed to walk in hospital room?: A Little Help needed climbing 3-5 steps with a railing? : A Lot 6 Click Score: 14    End of Session Equipment Utilized During Treatment: Right knee immobilizer Activity Tolerance: Patient tolerated treatment  well Patient left: in bed;with call bell/phone within reach;with bed alarm set Nurse Communication: Mobility status PT Visit Diagnosis: Muscle weakness (generalized) (M62.81);Difficulty in walking, not elsewhere classified (R26.2)     Time: 1583-0940 PT Time Calculation (min) (ACUTE ONLY): 28 min  Charges:  $Gait Training: 23-37 mins                     [place] Pager [phone] Office [phone]    Hoa Deriso 01/28/2020, 5:07 PM

## 2020-01-28 NOTE — Progress Notes (Signed)
    Subjective:  Patient reports pain as mild to moderate.  Denies N/V/CP/SOB. Patient is up and walking with therapy  Objective:   VITALS:   Vitals:   01/27/20 0610 01/27/20 1315 01/27/20 2223 01/28/20 0447  BP: 136/74 123/70 123/78 138/80  Pulse: 84 99 89 84  Resp: 16 16 20 20   Temp: 98.6 F (37 C) 98.5 F (36.9 C) 98.2 F (36.8 C) 98.4 F (36.9 C)  TempSrc: Oral Oral Oral Oral  SpO2: 96% 93% 100% 99%  Weight:      Height:        NAD Neurovascular intact Sensation intact distally Intact pulses distally Dorsiflexion/Plantar flexion intact   Lab Results  Component Value Date   WBC 7.9 01/27/2020   HGB 10.9 (L) 01/27/2020   HCT 33.9 (L) 01/27/2020   MCV 98.5 01/27/2020   PLT 106 (L) 01/27/2020   BMET    Component Value Date/Time   NA 141 01/26/2020 0353   K 4.1 01/26/2020 0353   CL 107 01/26/2020 0353   CO2 26 01/26/2020 0353   GLUCOSE 108 (H) 01/26/2020 0353   BUN 16 01/26/2020 0353   CREATININE 0.87 01/26/2020 0353   CALCIUM 8.6 (L) 01/26/2020 0353   GFRNONAA >60 01/26/2020 0353   GFRAA >60 08/23/2016 0410     Assessment/Plan: 4 Days Post-Op   Principal Problem:   OA (osteoarthritis) of knee Active Problems:   Bilateral primary osteoarthritis of knee   WBAT with walker DVT ppx: Xarelto, SCDs, TEDS PO pain control PT/OT Dispo: D/C to CIR once bed is available    Melissa Velazquez 01/28/2020, 10:15 AM  [facility] is now [facility] [address] [place], [place], [place] [address] Phone: [phone] [email or web address] Facebook  [employer]

## 2020-01-28 NOTE — Progress Notes (Signed)
Orthopedic Tech Progress Note Patient Details:  Melissa Velazquez Mar 09, 1950 [id number]  Patient ID: [name], female   DOB: 09-12-1950, 70 y.o.   MRN: [id number]   [name] 01/28/2020, 5:57 PM cpm removed from right leg @1800 

## 2020-01-28 NOTE — Progress Notes (Signed)
Orthopedic Tech Progress Note Patient Details:  Melissa Velazquez 09/04/1950 [id number]  Patient ID: [name], female   DOB: 31-Mar-1950, 70 y.o.   MRN: [id number]   [name] 01/28/2020, 4:34 PM Right leg placed in cpm @1430 

## 2020-01-28 NOTE — Progress Notes (Signed)
Physical Therapy Treatment Patient Details Name: Melissa Velazquez MRN: [id number] DOB: [birth year] Today's Date: 01/28/2020    History of Present Illness Pt s/p Bil TKR and with hx DM, CKD, and Kidney CA s/p partial nephrectomy    PT Comments    Pt continues to progress steadily with mobility including ambulation this am sans L KI   Follow Up Recommendations  CIR     Equipment Recommendations  Rolling walker with 5" wheels;3in1 (PT)    Recommendations for Other Services OT consult     Precautions / Restrictions Precautions Precautions: Fall;Knee Restrictions Weight Bearing Restrictions: No Other Position/Activity Restrictions: WBAT    Mobility  Bed Mobility Overal bed mobility: Needs Assistance Bed Mobility: Supine to Sit     Supine to sit: Min assist     General bed mobility comments: cues for sequence with assist to manage bil LEs  Transfers Overall transfer level: Needs assistance Equipment used: Rolling walker (2 wheeled) Transfers: Sit to/from Stand Sit to Stand: Min assist;Mod assist;From elevated surface         General transfer comment: Use of bed to assist; cues for LE management and use of UEs to self assist; physical assist to bring wt up and fwd and to balance in standing  Ambulation/Gait Ambulation/Gait assistance: Min assist Gait Distance (Feet): 38 Feet Assistive device: Rolling walker (2 wheeled) Gait Pattern/deviations: Step-to pattern;Decreased step length - right;Decreased step length - left;Shuffle;Trunk flexed Gait velocity: decr   General Gait Details: cues for posture, sequence, position from [employer]             Wheelchair Mobility    Modified Rankin (Stroke Patients Only)       Balance Overall balance assessment: Needs assistance Sitting-balance support: No upper extremity supported;Feet supported Sitting balance-Leahy Scale: Good     Standing balance support: Bilateral upper extremity supported Standing  balance-Leahy Scale: Poor                              Cognition Arousal/Alertness: Awake/alert Behavior During Therapy: WFL for tasks assessed/performed Overall Cognitive Status: Within Functional Limits for tasks assessed                                        Exercises Total Joint Exercises Ankle Circles/Pumps: AROM;Both;15 reps;Supine Quad Sets: AAROM;Both;Supine;15 reps Heel Slides: AAROM;Both;Supine;15 reps Straight Leg Raises: AAROM;Both;Supine;15 reps Goniometric ROM: AAROM bil knees -4 - 80    General Comments        Pertinent Vitals/Pain Pain Assessment: 0-10 Pain Score: 4  Pain Location: bil knees Pain Descriptors / Indicators: Aching;Sore Pain Intervention(s): Limited activity within patient's tolerance;Monitored during session;Premedicated before session;Ice applied    Home Living                      Prior Function            PT Goals (current goals can now be found in the care plan section) Acute Rehab PT Goals Patient Stated Goal: Regain IND PT Goal Formulation: With patient Time For Goal Achievement: 02/01/20 Potential to Achieve Goals: Good Progress towards PT goals: Progressing toward goals    Frequency    7X/week      PT Plan Current plan remains appropriate    Co-evaluation  AM-PAC PT "6 Clicks" Mobility   Outcome Measure  Help needed turning from your back to your side while in a flat bed without using bedrails?: A Lot Help needed moving from lying on your back to sitting on the side of a flat bed without using bedrails?: A Lot Help needed moving to and from a bed to a chair (including a wheelchair)?: A Lot Help needed standing up from a chair using your arms (e.g., wheelchair or bedside chair)?: A Lot Help needed to walk in hospital room?: A Little Help needed climbing 3-5 steps with a railing? : Total 6 Click Score: 12    End of Session Equipment Utilized During Treatment:  Right knee immobilizer Activity Tolerance: Patient tolerated treatment well Patient left: in chair;with call bell/phone within reach;with chair alarm set Nurse Communication: Mobility status PT Visit Diagnosis: Muscle weakness (generalized) (M62.81);Difficulty in walking, not elsewhere classified (R26.2)     Time: 1610-9604 PT Time Calculation (min) (ACUTE ONLY): 32 min  Charges:  $Gait Training: 8-22 mins $Therapeutic Exercise: 8-22 mins                     [place] Pager [phone] Office [phone]    Mick Tanguma 01/28/2020, 1:25 PM

## 2020-01-28 NOTE — Plan of Care (Signed)
  Problem: Activity: Goal: Ability to avoid complications of mobility impairment will improve Outcome: Progressing   Problem: Activity: Goal: Range of joint motion will improve Outcome: Progressing   Problem: Pain Management: Goal: Pain level will decrease with appropriate interventions Outcome: Progressing   Problem: Education: Goal: Knowledge of General Education information will improve Description: Including pain rating scale, medication(s)/side effects and non-pharmacologic comfort measures Outcome: Progressing

## 2020-01-29 ENCOUNTER — Inpatient Hospital Stay (HOSPITAL_COMMUNITY): Payer: Medicare Other

## 2020-01-29 DIAGNOSIS — I4891 Unspecified atrial fibrillation: Secondary | ICD-10-CM

## 2020-01-29 DIAGNOSIS — I361 Nonrheumatic tricuspid (valve) insufficiency: Secondary | ICD-10-CM | POA: Diagnosis not present

## 2020-01-29 DIAGNOSIS — R0902 Hypoxemia: Secondary | ICD-10-CM

## 2020-01-29 DIAGNOSIS — E039 Hypothyroidism, unspecified: Secondary | ICD-10-CM

## 2020-01-29 LAB — ECHOCARDIOGRAM COMPLETE
AR max vel: 2.28 cm2
AV Area VTI: 2.41 cm2
AV Area mean vel: 2.15 cm2
AV Mean grad: 4 mmHg
AV Peak grad: 9.1 mmHg
Ao pk vel: 1.51 m/s
Area-P 1/2: 4.31 cm2
Height: 64 in
S' Lateral: 2.4 cm
Weight: 3111.13 oz

## 2020-01-29 LAB — TSH: TSH: 2.872 u[IU]/mL (ref 0.350–4.500)

## 2020-01-29 LAB — BLOOD GAS, ARTERIAL
Acid-Base Excess: 3.4 mmol/L — ABNORMAL HIGH (ref 0.0–2.0)
Bicarbonate: 26.1 mmol/L (ref 20.0–28.0)
Drawn by: 11249
FIO2: 21
O2 Saturation: 95.6 %
Patient temperature: 98.3
pCO2 arterial: 34.7 mmHg (ref 32.0–48.0)
pH, Arterial: 7.489 — ABNORMAL HIGH (ref 7.350–7.450)
pO2, Arterial: 74 mmHg — ABNORMAL LOW (ref 83.0–108.0)

## 2020-01-29 LAB — CBC
HCT: 31.2 % — ABNORMAL LOW (ref 36.0–46.0)
Hemoglobin: 10.1 g/dL — ABNORMAL LOW (ref 12.0–15.0)
MCH: 31.7 pg (ref 26.0–34.0)
MCHC: 32.4 g/dL (ref 30.0–36.0)
MCV: 97.8 fL (ref 80.0–100.0)
Platelets: 161 10*3/uL (ref 150–400)
RBC: 3.19 MIL/uL — ABNORMAL LOW (ref 3.87–5.11)
RDW: 13.7 % (ref 11.5–15.5)
WBC: 8.4 10*3/uL (ref 4.0–10.5)
nRBC: 0 % (ref 0.0–0.2)

## 2020-01-29 LAB — D-DIMER, QUANTITATIVE: D-Dimer, Quant: 3.38 ug/mL-FEU — ABNORMAL HIGH (ref 0.00–0.50)

## 2020-01-29 LAB — GLUCOSE, CAPILLARY: Glucose-Capillary: 146 mg/dL — ABNORMAL HIGH (ref 70–99)

## 2020-01-29 MED ORDER — SODIUM CHLORIDE 0.9 % IV BOLUS
500.0000 mL | Freq: Once | INTRAVENOUS | Status: AC | PRN
  Administered 2020-01-29: 500 mL via INTRAVENOUS

## 2020-01-29 MED ORDER — METOPROLOL TARTRATE 5 MG/5ML IV SOLN
5.0000 mg | Freq: Once | INTRAVENOUS | Status: AC
  Administered 2020-01-29: 5 mg via INTRAVENOUS
  Filled 2020-01-29: qty 5

## 2020-01-29 NOTE — Progress Notes (Signed)
I was contacted and updated with patient's new status of feeling lightheaded upon being helped to the toilet. RN states that rapid response was initiated and patient was found to be in Afib w/ RVR.  Patient transferred to progressive care with telemetry. Hospitalist paged for consult.

## 2020-01-29 NOTE — Progress Notes (Signed)
Paged Dr Marlowe Sax to inform her that Cherlynn June PA is requesting a consult for rm. [id number]. His number is [phone]

## 2020-01-29 NOTE — Progress Notes (Signed)
Inpatient Rehab Admissions Coordinator:   Attempted to contact pt via phone, but no answer.  Will continue efforts.  Note workup for afib with RVR today.   Shann Medal, PT, DPT Admissions Coordinator [phone] 01/29/20  10:33 AM

## 2020-01-29 NOTE — Progress Notes (Signed)
Orthopedic Tech Progress Note Patient Details:  Melissa Velazquez 1950/12/22 [id number]  CPM Left Knee CPM Left Knee: On Left Knee Flexion (Degrees): 40 Left Knee Extension (Degrees): 10 CPM Right Knee CPM Right Knee: Off Right Knee Flexion (Degrees): 40 Right Knee Extension (Degrees): 10  Post Interventions Patient Tolerated: Well Instructions Provided: Care of device  [name] 01/29/2020, 12:44 PM

## 2020-01-29 NOTE — Progress Notes (Signed)
Patient transferred to room 1420.

## 2020-01-29 NOTE — Progress Notes (Signed)
Physical Therapy Treatment Patient Details Name: Melissa Velazquez MRN: [id number] DOB: 1950-11-03 Today's Date: 01/29/2020    History of Present Illness Pt s/p Bil TKR and with hx DM, CKD, and Kidney CA s/p partial nephrectomy    PT Comments    POD # 5 pm session Pt back in bed from OT session of amb around room and standing at sink to brush teeth.  Performed B LE TE's followed by ICE.  Called Ortho Tech after session "pt ready for CPM".   Follow Up Recommendations  CIR     Equipment Recommendations  Rolling walker with 5" wheels;3in1 (PT)    Recommendations for Other Services       Precautions / Restrictions Precautions Precautions: Fall;Knee Restrictions Weight Bearing Restrictions: No Other Position/Activity Restrictions: WBAT          Cognition Arousal/Alertness: Awake/alert Behavior During Therapy: WFL for tasks assessed/performed Overall Cognitive Status: Within Functional Limits for tasks assessed                                 General Comments: AxO x 3 very pleasant      Exercises   Bilateral Total Knee Replacement TE's following HEP handout 10 reps B LE ankle pumps 10 reps towel squeezes 10 reps knee presses 10 reps heel slides  10 reps SAQ's 10 reps SLR's 10 reps ABD Educated on use of gait belt to assist with TE's Followed by ICE    General Comments        Pertinent Vitals/Pain Pain Assessment: 0-10 Pain Score: 5  Faces Pain Scale: Hurts little more Pain Location: B knees L>R Pain Descriptors / Indicators: Aching;Sore;Tender;Tightness Pain Intervention(s): Limited activity within patient's tolerance;Monitored during session;Repositioned;Ice applied    Home Living                      Prior Function            PT Goals (current goals can now be found in the care plan section) Acute Rehab PT Goals Patient Stated Goal: Regain IND Progress towards PT goals: Progressing toward goals    Frequency     7X/week      PT Plan Current plan remains appropriate    Co-evaluation              AM-PAC PT "6 Clicks" Mobility   Outcome Measure  Help needed turning from your back to your side while in a flat bed without using bedrails?: A Little Help needed moving from lying on your back to sitting on the side of a flat bed without using bedrails?: A Little Help needed moving to and from a bed to a chair (including a wheelchair)?: A Little Help needed standing up from a chair using your arms (e.g., wheelchair or bedside chair)?: A Lot Help needed to walk in hospital room?: A Lot Help needed climbing 3-5 steps with a railing? : Total 6 Click Score: 14    End of Session Equipment Utilized During Treatment: Gait belt Activity Tolerance: Patient tolerated treatment well Patient left: in chair;with call bell/phone within reach Nurse Communication: Mobility status PT Visit Diagnosis: Muscle weakness (generalized) (M62.81);Difficulty in walking, not elsewhere classified (R26.2)     Time: 1410-1435 PT Time Calculation (min) (ACUTE ONLY): 25 min  Charges:  $Therapeutic Exercise: 23-37 mins  Rica Koyanagi  PTA Acute  Rehabilitation Services Pager      [phone] Office      [phone]

## 2020-01-29 NOTE — Plan of Care (Signed)

## 2020-01-29 NOTE — Progress Notes (Signed)
EKG result Atrial Fibrillation with RVR

## 2020-01-29 NOTE — Progress Notes (Signed)
Paged [facility]. 

## 2020-01-29 NOTE — Progress Notes (Signed)
Response from [facility] received. Patient to transfer to progressive telemetry and hospitalist consult.

## 2020-01-29 NOTE — Progress Notes (Signed)
  Echocardiogram 2D Echocardiogram has been performed.  Melissa Velazquez 01/29/2020, 1:22 PM

## 2020-01-29 NOTE — Consult Note (Signed)
Medical Consultation   Melissa Velazquez  DJT:[id number]  DOB: 1950/05/30  DOA: 01/24/2020  PCP: [name], MD   Requesting provider: Cherlynn June, PA  Reason for consultation: A. fib with RVR   History of Present Illness: Melissa Velazquez is an 70 y.o. female with a past medical history of hypertension, hypothyroidism, diet-controlled type II diabetes, mild intermittent asthma, depression, bilateral knee osteoarthritis status post bilateral total knee arthroplasty on 01/24/2020 currently admitted under orthopedic service.  Paged by orthopedic PA requesting consultation for A. fib with RVR.  Patient was immediately seen and examined at bedside.  Resting comfortably.  Told me that when she went to use the bathroom at around 2 AM she felt lightheaded, was seeing dark spots in her vision, and felt like she was going to pass out.  She did not experience any palpitations, shortness of breath, or chest pain at that time.  However, does report some prior episodes of palpitations in the past few days.  Denies history of A. fib but states she has "arrhythmia" and has been seen by a cardiologist previously.  Rapid response RN and patient's nurse reported that patient went to use the bathroom around 2 AM this morning and became lightheaded and felt like she was going to pass out.  Rapid response was called and patient found to be in A. fib with heart rate in the 170s.  Her blood pressure was 131/120 at that time.  She was satting 88% on room air and was placed on 2 L supplemental oxygen.  Her CBG was 146.  She was given IV Lopressor 5 mg at that time which was ordered by orthopedics.  At the time of my evaluation, she had converted back to sinus rhythm with heart rate in the 80s.  Satting in the mid 90s on room air.  No tachypnea or signs of respiratory distress.  However, noted to be hypotensive with systolic in the 30S.  Review of Systems:  ROS As per HPI otherwise 10 point review of  systems negative.    Past Medical History: Past Medical History:  Diagnosis Date  . Allergic rhinitis   . Anal fissure    Hx of   . Anxiety    hx of  . Arthritis   . Asthma    none in last year   . Chronic kidney disease    partial nephrectomy  . Depression    hx of  . Diabetes mellitus without complication (HCC)    no meds  . Diverticular disease   . Dysrhythmia    palpitations  . Eczema   . Endometrial cancer ([place]) 2001   spread to appendix   . Hemorrhoids   . Hyperglycemia   . Hypertension   . Hypothyroidism   . Kidney cancer, primary, with metastasis from kidney to other site [facility])   . Obesity   . Sebaceous cyst   . Thyroid disease   . Thyroid nodule   . 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    Past Surgical History: Past Surgical History:  Procedure Laterality Date  . APPENDECTOMY  2008  . BIOPSY  12/14/2016   Procedure: BIOPSY;  Surgeon: Rogene Houston, MD;  Location: [facility];  Service: Endoscopy;;  colon  . CHOLECYSTECTOMY  2008  . COLONOSCOPY N/A 11/24/2012   Procedure: COLONOSCOPY;  Surgeon: Rogene Houston, MD;  Location: [facility];  Service: Endoscopy;  Laterality: N/A;  830  . COLONOSCOPY N/A 12/14/2016   Procedure: COLONOSCOPY;  Surgeon: Rogene Houston, MD;  Location: [facility];  Service: Endoscopy;  Laterality: N/A;  12:00  . POLYPECTOMY  12/14/2016   Procedure: POLYPECTOMY;  Surgeon: Rogene Houston, MD;  Location: [facility];  Service: Endoscopy;;  colon  . ROBOTIC ASSITED PARTIAL NEPHRECTOMY Left 08/21/2016   Procedure: XI ROBOTIC ASSITED PARTIAL NEPHRECTOMY;  Surgeon: Ardis Hughs, MD;  Location: [facility];  Service: Urology;  Laterality: Left;  . TOTAL ABDOMINAL HYSTERECTOMY  2001  . TOTAL KNEE ARTHROPLASTY Bilateral 01/24/2020   Procedure: TOTAL KNEE BILATERAL;  Surgeon: Gaynelle Arabian, MD;  Location: [facility];  Service: Orthopedics;  Laterality: Bilateral;  . WISDOM TOOTH EXTRACTION       Allergies:    Allergies  Allergen Reactions  . Triple Antibiotic [Bacitracin-Neomycin-Polymyxin] Anaphylaxis  . Levofloxacin Other (See Comments)    Stomach upset  . Capsaicin Rash  . Elastic Bandages & [Zinc] Rash  . Nickel Itching and Rash     Social History:  reports that she quit smoking about 22 years ago. Her smoking use included cigarettes. She has a 20.00 pack-year smoking history. She has never used smokeless tobacco. She reports current alcohol use. She reports that she does not use drugs.   Family History: Family History  Problem Relation Age of Onset  . Breast cancer Mother   . Diabetes Mother   . Obesity Sister   . Asthma Sister   . Eczema Sister   . COPD Father        smoked  . Prostate cancer Father   . Breast cancer Maternal Aunt   . Urticaria Maternal Grandfather   . Colon cancer Neg Hx       Physical Exam: Vitals:   01/28/20 1950 01/28/20 2243 01/29/20 0207 01/29/20 0420  BP: 131/79  (!) 131/120 (!) 142/116  Pulse: 94  (!) 106 86  Resp: 16  16 16   Temp: (!) 100.5 F (38.1 C) 99.5 F (37.5 C) 97.9 F (36.6 C) 98.9 F (37.2 C)  TempSrc: Oral Oral Oral Oral  SpO2: 92%  96% 94%  Weight:      Height:        Physical Exam Constitutional:      General: She is not in acute distress. HENT:     Head: Normocephalic and atraumatic.  Eyes:     Extraocular Movements: Extraocular movements intact.     Conjunctiva/sclera: Conjunctivae normal.  Cardiovascular:     Rate and Rhythm: Normal rate and regular rhythm.     Pulses: Normal pulses.  Pulmonary:     Effort: Pulmonary effort is normal. No respiratory distress.     Breath sounds: Normal breath sounds. No wheezing or rales.  Abdominal:     General: Bowel sounds are normal. There is no distension.     Palpations: Abdomen is soft.     Tenderness: There is no abdominal tenderness.  Musculoskeletal:        General: No swelling or tenderness.     Cervical back: Normal range of motion and neck supple.  Skin:     General: Skin is warm and dry.  Neurological:     General: No focal deficit present.     Mental Status: She is alert and oriented to person, place, and time.      Data reviewed:  I have personally reviewed the recent labs and imaging studies  Pertinent Labs:  Inpatient Medications:   Scheduled Meds: . Chlorhexidine Gluconate Cloth  6 each Topical Daily  . docusate sodium  100 mg Oral BID  . gabapentin  300 mg Oral TID  . levothyroxine  75 mcg Oral QAC breakfast  . loratadine  10 mg Oral Daily  . rivaroxaban  10 mg Oral Daily  . sertraline  150 mg Oral Q breakfast   Continuous Infusions: . sodium chloride 75 mL/hr at 01/26/20 1519     Radiological Exams on Admission: DG CHEST PORT 1 VIEW  Result Date: 01/29/2020 CLINICAL DATA:  Shortness of breath EXAM: PORTABLE CHEST 1 VIEW COMPARISON:  10/25/2018 FINDINGS: Normal heart size and aortic contours. Hazy opacity at the medial right base correlates with fat pad on 2009 CT and is stable from prior chest x-ray. No acute infiltrate or edema. No effusion or pneumothorax. No acute osseous findings. Artifact from EKG leads. IMPRESSION: Negative portable chest. Electronically Signed   By: Monte Fantasia M.D.   On: 01/29/2020 04:58    Impression/Recommendations Principal Problem:   Atrial fibrillation with rapid ventricular response (HCC) Active Problems:   Mild intermittent asthma, uncomplicated   Bilateral primary osteoarthritis of knee   Hypoxia   Hypothyroidism  New onset A. fib with RVR: No documented history of A. fib.   Per chart, patient was previously seen by cardiology in June 2021 for palpitations and plan was to do outpatient rhythm monitoring. Patient had an episode of lightheadedness and near syncope while using the bathroom tonight.  Rapid response was called and EKG done at that time revealed A. fib with rate in the 170s.  Patient was given IV metoprolol 5 mg at that time.  At the time of my evaluation, she had  converted back to sinus rhythm with rate in the 80s but noted to be hypotensive with systolic in the [id number].  She was given 1 L fluid bolus and blood pressure improved with systolic in the 0000000.  Chest x-ray was done and negative for acute process.  PE is a consideration but she has been on Xarelto since her recent knee surgery. Patient transferred to progressive care unit.  Continue cardiac monitoring.  Given hypotension after metoprolol, will hold any additional doses of beta-blocker or calcium channel blocker at this time as she has converted to sinus rhythm. CHA2DS2VASc 4, continue Xarelto for anticoagulation.  Echocardiogram has been ordered.  Check TSH level.   Hypoxia: Rapid response reported that patient sats were 88% on room air at the time of rapid A. fib.  Blood gas showing mild hypoxia.  After converting back to sinus rhythm, satting in the mid 90s on room air with no signs of respiratory distress.  Chest x-ray showing no acute process.  Lungs clear on exam.  PE less likely as she has been on Xarelto since her recent surgery.  Will check D-dimer level.   Avoid opiates/ sedating medications at this time.  Continuous pulse ox, supplemental oxygen to keep sats above 94%.  Hypertension: Avoid antihypertensives at this time given episode of hypotension.  Hypothyroidism: Continue home Synthroid.  Check TSH level.  Diet controlled type 2 diabetes: A1c 5.1 on 01/18/2020.  Mild intermittent asthma: Stable.  No bronchospasm.  She uses only as needed rescue inhaler at home.  Continue albuterol as needed.  Depression: Continue home medication  Bilateral knee osteoarthritis status post bilateral total knee arthroplasty on 01/24/2020: Management per primary team  Thank you for this consultation.  Our [facility] hospitalist team will follow the patient  with you.   Time Spent: 60 minutes  Shela Leff M.D. [facility] 01/29/2020, 5:37 AM

## 2020-01-29 NOTE — Care Management Important Message (Signed)
Important Message  Patient Details IM Letter given to the Patient. Name: ABBEE CREMEENS MRN: [id number] Date of Birth: Oct 07, 1950   Medicare Important Message Given:  Yes     Kerin Salen 01/29/2020, 12:31 PM

## 2020-01-29 NOTE — Progress Notes (Signed)
Report given to [facility].

## 2020-01-29 NOTE — Progress Notes (Signed)
Patient diaphoretic, feeling she is going to pass out, and seeing spots. Rapid response called.

## 2020-01-29 NOTE — Progress Notes (Signed)
Occupational Therapy Treatment Patient Details Name: Melissa Velazquez MRN: [id number] DOB: February 07, 1950 Today's Date: 01/29/2020    History of present illness Pt s/p Bil TKR and with hx DM, CKD, and Kidney CA s/p partial nephrectomy   OT comments  Patient progressing and showed improved overall ADL performance compared to previous session as evidenced by increased score on AM-PAC "6-clicks" Occupational Performance measure from a score of 14/18 previously, to current score of 18/24. Patient remains limited by Bil knee pain and unsteadiness with transfers and functional mobility as well as decreased ADLs, along with deficits noted below. Pt continues to demonstrate very good rehab potential and would benefit from continued skilled OT to increase safety and independence with ADLs and functional transfers to allow pt to return home safely and reduce caregiver burden and fall risk.   Follow Up Recommendations  CIR    Equipment Recommendations  None recommended by OT    Recommendations for Other Services      Precautions / Restrictions Precautions Precautions: Fall;Knee Restrictions Weight Bearing Restrictions: No Other Position/Activity Restrictions: WBAT       Mobility Bed Mobility Overal bed mobility: Needs Assistance Bed Mobility: Supine to Sit;Sit to Supine     Supine to sit: Min assist Sit to supine: Min assist   General bed mobility comments: Increased effort and pt able to raise LEs partially up with Min Assist to completely bring back to bed.  Pt able to scoot in sitting and in supine Mod I.  Transfers Overall transfer level: Needs assistance Equipment used: Rolling walker (2 wheeled) Transfers: Sit to/from Stand Sit to Stand: Min assist Stand pivot transfers: Min assist       General transfer comment: Pt stood from EOB with cues for hand placement and increased effort with Min As after pt rocking forward to gain momentum to power up. Increased time to come to full  standing position.    Balance Overall balance assessment: Needs assistance Sitting-balance support: No upper extremity supported;Feet supported Sitting balance-Leahy Scale: Good       Standing balance-Leahy Scale: Poor Standing balance comment: Pt able to stand at sink and use BUEs for grooming/hygiene with compensation of forearms resting on vanity as needed.  Reliant on RW for BUE support during dynamic standing activities.                           ADL either performed or assessed with clinical judgement   ADL Overall ADL's : Needs assistance/impaired     Grooming: Standing;Supervision/safety;Wash/dry face;Oral care;Wash/dry hands Grooming Details (indicate cue type and reason): Pt able to stand at sink, and demonstrated positioning RW correctly anterior to sink without cues. Pt stood for 8 min and performed grooming/hygiene. No dizziness/lighheadedness and vitals stable.                       Toileting - Clothing Manipulation Details (indicate cue type and reason): Pt denied need to void.     Functional mobility during ADLs: Min guard;Minimal assistance;Rolling walker       Vision Patient Visual Report: No change from baseline Vision Assessment?: No apparent visual deficits   Perception     Praxis      Cognition Arousal/Alertness: Awake/alert Behavior During Therapy: WFL for tasks assessed/performed Overall Cognitive Status: Within Functional Limits for tasks assessed  General Comments: AxO x 3 very pleasant        Exercises     Shoulder Instructions       General Comments      Pertinent Vitals/ Pain       Pain Assessment: 0-10 Pain Score: 5  Faces Pain Scale: Hurts little more Pain Location: B knees L>R Pain Descriptors / Indicators: Aching;Sore;Tender;Tightness Pain Intervention(s): Limited activity within patient's tolerance;Monitored during session;Repositioned;Ice applied  Home  Living                                          Prior Functioning/Environment              Frequency  Min 2X/week        Progress Toward Goals  OT Goals(current goals can now be found in the care plan section)  Progress towards OT goals: Progressing toward goals  Acute Rehab OT Goals Patient Stated Goal: Regain IND OT Goal Formulation: With patient Time For Goal Achievement: 02/09/20 Potential to Achieve Goals: Good  Plan Discharge plan remains appropriate    Co-evaluation                 AM-PAC OT "6 Clicks" Daily Activity     Outcome Measure   Help from another person eating meals?: None Help from another person taking care of personal grooming?: A Little Help from another person toileting, which includes using toliet, bedpan, or urinal?: A Little Help from another person bathing (including washing, rinsing, drying)?: A Little Help from another person to put on and taking off regular upper body clothing?: A Little Help from another person to put on and taking off regular lower body clothing?: A Lot 6 Click Score: 18    End of Session Equipment Utilized During Treatment: Gait belt;Rolling walker CPM Left Knee CPM Left Knee: Off  OT Visit Diagnosis: Unsteadiness on feet (R26.81);Other abnormalities of gait and mobility (R26.89);Pain Pain - Right/Left:  (Bil knees) Pain - part of body: Knee   Activity Tolerance Patient tolerated treatment well;No increased pain   Patient Left in bed;with call bell/phone within reach;with bed alarm set   Nurse Communication Mobility status        Time: 3016-0109 OT Time Calculation (min): 27 min  Charges: OT General Charges $OT Visit: 1 Visit OT Treatments $Self Care/Home Management : 8-22 mins $Therapeutic Activity: 8-22 mins  Anderson Malta, [place] Office: [phone] 01/29/2020   Julien Girt 01/29/2020, 2:37 PM

## 2020-01-29 NOTE — Progress Notes (Signed)
Dr. Marlowe Sax arrived to 1341.

## 2020-01-29 NOTE — Progress Notes (Signed)
Physical Therapy Treatment Patient Details Name: Melissa Velazquez MRN: [id number] DOB: 11-22-1950 Today's Date: 01/29/2020    History of Present Illness Pt s/p Bil TKR and with hx DM, CKD, and Kidney CA s/p partial nephrectomy    PT Comments    POD # 5 am session First performed TKR TE's using a long sheet to assist with HS.  R knee flex approx 70 degrees and L approx 60 degrees after 10 reps.  Assisted OOB to [facility] then amb around room.   Pt progressing and plans to D/C to CIR  Follow Up Recommendations  CIR     Equipment Recommendations  Rolling walker with 5" wheels;3in1 (PT)    Recommendations for Other Services       Precautions / Restrictions Precautions Precautions: Fall;Knee Restrictions Weight Bearing Restrictions: No Other Position/Activity Restrictions: WBAT    Mobility  Bed Mobility Overal bed mobility: Needs Assistance Bed Mobility: Supine to Sit     Supine to sit: Min assist     General bed mobility comments: cues for sequence with assist to manage bil LEs  Transfers Overall transfer level: Needs assistance Equipment used: Rolling walker (2 wheeled) Transfers: Sit to/from Stand Sit to Stand: Min assist;Mod assist;From elevated surface Stand pivot transfers: Mod assist;+2 physical assistance;+2 safety/equipment;From elevated surface       General transfer comment: assisted from elevated bed to [facility] and off BSC to amb with VC's on safety  Ambulation/Gait Ambulation/Gait assistance: Min guard Gait Distance (Feet): 24 Feet Assistive device: Rolling walker (2 wheeled) Gait Pattern/deviations: Step-to pattern;Decreased step length - right;Decreased step length - left;Shuffle;Trunk flexed Gait velocity: decr   General Gait Details: cues for posture, sequence, position from [employer]             Wheelchair Mobility    Modified Rankin (Stroke Patients Only)       Balance                                             Cognition Arousal/Alertness: Awake/alert Behavior During Therapy: WFL for tasks assessed/performed Overall Cognitive Status: Within Functional Limits for tasks assessed                                 General Comments: AxO x 3 very pleasant      Exercises   Total Knee Replacement TE's following HEP handout 10 reps B LE ankle pumps 05 reps towel squeezes 05 reps knee presses 05 reps heel slides  05 reps SAQ's 05 reps SLR's 05 reps ABD Educated on use of gait belt to assist with TE's Followed by ICE     General Comments        Pertinent Vitals/Pain Pain Assessment: Faces Faces Pain Scale: Hurts little more Pain Location: B knees L>R Pain Descriptors / Indicators: Aching;Sore;Tender;Tightness Pain Intervention(s): Monitored during session;Premedicated before session;Repositioned;Ice applied    Home Living                      Prior Function            PT Goals (current goals can now be found in the care plan section) Progress towards PT goals: Progressing toward goals    Frequency    7X/week      PT Plan Current plan remains  appropriate    Co-evaluation              AM-PAC PT "6 Clicks" Mobility   Outcome Measure  Help needed turning from your back to your side while in a flat bed without using bedrails?: A Little Help needed moving from lying on your back to sitting on the side of a flat bed without using bedrails?: A Little Help needed moving to and from a bed to a chair (including a wheelchair)?: A Little Help needed standing up from a chair using your arms (e.g., wheelchair or bedside chair)?: A Lot Help needed to walk in hospital room?: A Lot Help needed climbing 3-5 steps with a railing? : Total 6 Click Score: 14    End of Session Equipment Utilized During Treatment: Gait belt Activity Tolerance: Patient tolerated treatment well Patient left: in chair;with call bell/phone within reach Nurse Communication: Mobility  status PT Visit Diagnosis: Muscle weakness (generalized) (M62.81);Difficulty in walking, not elsewhere classified (R26.2)     Time: 0315-9458 PT Time Calculation (min) (ACUTE ONLY): 41 min  Charges:  $Gait Training: 8-22 mins $Therapeutic Exercise: 8-22 mins $Therapeutic Activity: 8-22 mins                     Rica Koyanagi  PTA Acute  Rehabilitation Services Pager      [phone] Office      [phone]

## 2020-01-29 NOTE — Plan of Care (Signed)
Patient seen by Dr. Marlowe Sax earlier today.   Large left pleural effusion noted on echo? CXR was clear overnight. Repeat CXR now; may give lasix if present. Patient not on IVF and respiratory status is stable. No CP either.  She has been weaned back to room air when seen this morning.  I do think afib was precipitated/provoked short term.  Especially since resolved rather quickly after occurrence.  She was endorsing being in some pain just prior to the event.  Would not want to commit her to lifelong anticoagulation in this acute setting quite yet.  Given bilateral knee surgery it is reasonable to continue her on Xarelto at discharge for ongoing anticoagulation with outpatient follow-up with cardiology to discuss whether or not she continues on anticoagulation.  Would also like to place a Zio patch at discharge, if able, to evaluate for any further paroxysmal A. Fib.  Dwyane Dee, MD [facility] 01/29/2020, 3:30 PM

## 2020-01-29 NOTE — Progress Notes (Signed)
Orthopedic Tech Progress Note Patient Details:  Melissa Velazquez 11/11/1950 [id number]  CPM Left Knee CPM Left Knee: Off Left Knee Flexion (Degrees): 40 Left Knee Extension (Degrees): 10 CPM Right Knee CPM Right Knee: Off Right Knee Flexion (Degrees): 40 Right Knee Extension (Degrees): 10  Post Interventions Patient Tolerated: Well Instructions Provided: Care of device  [name] 01/29/2020, 1:42 PM

## 2020-01-29 NOTE — Progress Notes (Signed)
Orthopedic Tech Progress Note Patient Details:  Melissa Velazquez Dec 31, 1950 [id number]  Ortho Devices Ortho Device/Splint Location: RLE on cpm   Post Interventions Patient Tolerated: Well Instructions Provided: Care of device   Braulio Bosch 01/29/2020, 3:20 PM

## 2020-01-29 NOTE — Progress Notes (Signed)
   01/29/20 0500  Integumentary  Integumentary (WDL) X  RN Assisting with Skin Assessment on Admission Kristie, RN  Open area noted on left buttocks area near anus.  No bleeding noted.  Pt states it came from bedpan. Will continue to monitor.

## 2020-01-29 NOTE — Progress Notes (Signed)
   Subjective: 5 Days Post-Op Procedure(s) (LRB): TOTAL KNEE BILATERAL (Bilateral) Patient reports pain as mild.   Patient seen in rounds for Dr. Wynelle Link. Patient had rapid response called last night due to dizziness when ambulating to the bathroom around 2am last night. Was found to be in afib with RVR. Patient was transferred to progressive unit and hospitalists were consulted for management. Back in sinus rhythm at this time, resting comfortably in bed. Denies palpitations or shortness of breath. Pain controlled.   Objective: Vital signs in last 24 hours: Temp:  [97.9 F (36.6 C)-100.5 F (38.1 C)] 98.9 F (37.2 C) (01/10 0420) Pulse Rate:  [78-175] 86 (01/10 0420) Resp:  [16-22] 16 (01/10 0420) BP: (84-142)/(45-120) 142/116 (01/10 0420) SpO2:  [92 %-99 %] 94 % (01/10 0420)  Intake/Output from previous day:  Intake/Output Summary (Last 24 hours) at 01/29/2020 0809 Last data filed at 01/28/2020 1800 Gross per 24 hour  Intake 480 ml  Output 1100 ml  Net -620 ml    Intake/Output this shift: No intake/output data recorded.  Labs: Recent Labs    01/27/20 0328  HGB 10.9*   Recent Labs    01/27/20 0328  WBC 7.9  RBC 3.44*  HCT 33.9*  PLT 106*   Exam: General - Patient is Alert and Oriented Extremity - Neurologically intact Neurovascular intact Sensation intact distally Dorsiflexion/Plantar flexion intact Dressing/Incision - clean, dry, no drainage Motor Function - intact, moving foot and toes well on exam.   Past Medical History:  Diagnosis Date  . Allergic rhinitis   . Anal fissure    Hx of   . Anxiety    hx of  . Arthritis   . Asthma    none in last year   . Chronic kidney disease    partial nephrectomy  . Depression    hx of  . Diabetes mellitus without complication (HCC)    no meds  . Diverticular disease   . Dysrhythmia    palpitations  . Eczema   . Endometrial cancer ([place]) 2001   spread to appendix   . Hemorrhoids   . Hyperglycemia   .  Hypertension   . Hypothyroidism   . Kidney cancer, primary, with metastasis from kidney to other site [facility])   . Obesity   . Sebaceous cyst   . Thyroid disease   . Thyroid nodule   . 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    Assessment/Plan: 5 Days Post-Op Procedure(s) (LRB): TOTAL KNEE BILATERAL (Bilateral) Principal Problem:   Atrial fibrillation with rapid ventricular response (HCC) Active Problems:   Mild intermittent asthma, uncomplicated   Bilateral primary osteoarthritis of knee   Hypoxia   Hypothyroidism  Estimated body mass index is 33.38 kg/m as calculated from the following:   Height as of this encounter: 5\' 4"  (1.626 m).   Weight as of this encounter: 88.2 kg. Up with therapy  DVT Prophylaxis - Xarelto Weight-bearing as tolerated  Ordered CBC to check hemoglobin, 10.9 as of 1/8.    Originally planned to discharge CIR hopefully today, however will need to hold off until medically cleared. In the meantime, will get up with therapy today and use CPMs while resting in bed. Knees look great today, minimal swelling.  Echocardiogram scheduled to be performed today. Appreciative of [facility]'s assistance with medical management of our patient.  Theresa Duty, PA-C Orthopedic Surgery [phone] 01/29/2020, 8:09 AM

## 2020-01-29 NOTE — Significant Event (Signed)
Rapid Response Event Note   Reason for Call :  Patient reports feeling dizzy after walking to bathroom and attempting to have a bowel movement.    Initial Focused Assessment:  Patient in bathroom when RRT arrived, patient is alert/ orient x 4. Skin is cold clammy, diaphoretic, assisted patient back to bed. Patient reports that she has seen a cardiologist in past for a flutter in her heart. EKG shows A-Fib RVR   Interventions:  EKG  HR 150-170  Afib RVR  CBG- 146  Plan of Care:  EKG Metoprolol 5 mg IV x 1.  Transfer to progressive/telemtry Bolus x 1 due to hypotensive.   Event Summary:  Primary nurse notified Cherlynn June PA, orders to transfer to progressive, Metoprolol 5 mg X 1. Consult Hospitalists. Dr. Marlowe Sax arrived at bedside to examine patient before transfer. ABG and CXR ordered.   MD Notified: Cherlynn June, PA; Dr . Marlowe Sax  Call Time: 01/29/20: 0205 Arrival Time: 01/29/20: 0210  End Time: [place] J Caster [place], RN

## 2020-01-29 NOTE — Progress Notes (Signed)
Inpatient Rehab Admissions:  Inpatient Rehab Consult received.  I spoke with patient over the phone for rehabilitation assessment and to discuss goals and expectations of an inpatient rehab admission.  She is motivated to return to independent level and hopeful for CIR admission.  She would need to reach modified independent level (independent with a device or increased time for all mobility and ADLs), as she only has intermittent assist at home.  I think this is a reasonable goal for her to achieve in about 2 weeks time.  Attempted to speak to her sister, but voicemail box was full, so I will try again tomorrow.  Will plan for possible admit in the next few days once a bed becomes available.    Signed: Shann Medal, PT, DPT Admissions Coordinator [phone] 01/29/20  4:43 PM

## 2020-01-30 ENCOUNTER — Other Ambulatory Visit: Payer: Self-pay

## 2020-01-30 ENCOUNTER — Encounter (HOSPITAL_COMMUNITY): Payer: Self-pay | Admitting: Physical Medicine and Rehabilitation

## 2020-01-30 ENCOUNTER — Inpatient Hospital Stay (HOSPITAL_COMMUNITY)
Admission: RE | Admit: 2020-01-30 | Discharge: 2020-02-11 | DRG: 560 | Disposition: A | Discharge status: Home Health | Payer: Medicare Other | Source: Other Acute Inpatient Hospital | Attending: Physical Medicine and Rehabilitation | Admitting: Physical Medicine and Rehabilitation

## 2020-01-30 DIAGNOSIS — Z825 Family history of asthma and other chronic lower respiratory diseases: Secondary | ICD-10-CM | POA: Diagnosis not present

## 2020-01-30 DIAGNOSIS — Z85528 Personal history of other malignant neoplasm of kidney: Secondary | ICD-10-CM | POA: Diagnosis not present

## 2020-01-30 DIAGNOSIS — Z79890 Hormone replacement therapy (postmenopausal): Secondary | ICD-10-CM

## 2020-01-30 DIAGNOSIS — K580 Irritable bowel syndrome with diarrhea: Secondary | ICD-10-CM | POA: Diagnosis present

## 2020-01-30 DIAGNOSIS — Z471 Aftercare following joint replacement surgery: Principal | ICD-10-CM

## 2020-01-30 DIAGNOSIS — I4891 Unspecified atrial fibrillation: Secondary | ICD-10-CM

## 2020-01-30 DIAGNOSIS — Z888 Allergy status to other drugs, medicaments and biological substances status: Secondary | ICD-10-CM

## 2020-01-30 DIAGNOSIS — D62 Acute posthemorrhagic anemia: Secondary | ICD-10-CM

## 2020-01-30 DIAGNOSIS — M170 Bilateral primary osteoarthritis of knee: Principal | ICD-10-CM

## 2020-01-30 DIAGNOSIS — I129 Hypertensive chronic kidney disease with stage 1 through stage 4 chronic kidney disease, or unspecified chronic kidney disease: Secondary | ICD-10-CM | POA: Diagnosis present

## 2020-01-30 DIAGNOSIS — Z91048 Other nonmedicinal substance allergy status: Secondary | ICD-10-CM

## 2020-01-30 DIAGNOSIS — M1710 Unilateral primary osteoarthritis, unspecified knee: Secondary | ICD-10-CM | POA: Diagnosis present

## 2020-01-30 DIAGNOSIS — E1122 Type 2 diabetes mellitus with diabetic chronic kidney disease: Secondary | ICD-10-CM | POA: Diagnosis present

## 2020-01-30 DIAGNOSIS — Z905 Acquired absence of kidney: Secondary | ICD-10-CM

## 2020-01-30 DIAGNOSIS — R0902 Hypoxemia: Secondary | ICD-10-CM

## 2020-01-30 DIAGNOSIS — Z79899 Other long term (current) drug therapy: Secondary | ICD-10-CM | POA: Diagnosis not present

## 2020-01-30 DIAGNOSIS — Z87891 Personal history of nicotine dependence: Secondary | ICD-10-CM | POA: Diagnosis not present

## 2020-01-30 DIAGNOSIS — E039 Hypothyroidism, unspecified: Secondary | ICD-10-CM | POA: Diagnosis present

## 2020-01-30 DIAGNOSIS — N1831 Chronic kidney disease, stage 3a: Secondary | ICD-10-CM | POA: Diagnosis present

## 2020-01-30 DIAGNOSIS — Z96653 Presence of artificial knee joint, bilateral: Secondary | ICD-10-CM | POA: Diagnosis present

## 2020-01-30 DIAGNOSIS — K219 Gastro-esophageal reflux disease without esophagitis: Secondary | ICD-10-CM | POA: Diagnosis present

## 2020-01-30 DIAGNOSIS — Z885 Allergy status to narcotic agent status: Secondary | ICD-10-CM

## 2020-01-30 DIAGNOSIS — Z881 Allergy status to other antibiotic agents status: Secondary | ICD-10-CM | POA: Diagnosis not present

## 2020-01-30 DIAGNOSIS — Z8542 Personal history of malignant neoplasm of other parts of uterus: Secondary | ICD-10-CM

## 2020-01-30 DIAGNOSIS — Z833 Family history of diabetes mellitus: Secondary | ICD-10-CM | POA: Diagnosis not present

## 2020-01-30 DIAGNOSIS — Z803 Family history of malignant neoplasm of breast: Secondary | ICD-10-CM

## 2020-01-30 DIAGNOSIS — J4520 Mild intermittent asthma, uncomplicated: Secondary | ICD-10-CM

## 2020-01-30 DIAGNOSIS — Z8042 Family history of malignant neoplasm of prostate: Secondary | ICD-10-CM | POA: Diagnosis not present

## 2020-01-30 DIAGNOSIS — Z9889 Other specified postprocedural states: Secondary | ICD-10-CM | POA: Diagnosis not present

## 2020-01-30 DIAGNOSIS — I480 Paroxysmal atrial fibrillation: Secondary | ICD-10-CM | POA: Diagnosis present

## 2020-01-30 DIAGNOSIS — F32A Depression, unspecified: Secondary | ICD-10-CM | POA: Diagnosis present

## 2020-01-30 DIAGNOSIS — R7989 Other specified abnormal findings of blood chemistry: Secondary | ICD-10-CM | POA: Diagnosis not present

## 2020-01-30 DIAGNOSIS — M179 Osteoarthritis of knee, unspecified: Secondary | ICD-10-CM | POA: Diagnosis present

## 2020-01-30 MED ORDER — SERTRALINE HCL 50 MG PO TABS
150.0000 mg | ORAL_TABLET | Freq: Every day | ORAL | Status: DC
  Administered 2020-01-31 – 2020-02-11 (×12): 150 mg via ORAL
  Filled 2020-01-30 (×12): qty 1

## 2020-01-30 MED ORDER — GABAPENTIN 300 MG PO CAPS: 300.0000 mg | ORAL_CAPSULE | Freq: Three times a day (TID) | ORAL | Status: DC

## 2020-01-30 MED ORDER — RIVAROXABAN 10 MG PO TABS
10.0000 mg | ORAL_TABLET | Freq: Every day | ORAL | Status: DC
  Administered 2020-01-31 – 2020-02-10 (×11): 10 mg via ORAL
  Filled 2020-01-30 (×11): qty 1

## 2020-01-30 MED ORDER — POLYETHYLENE GLYCOL 3350 17 G PO PACK
17.0000 g | PACK | Freq: Every day | ORAL | Status: DC | PRN
  Filled 2020-01-30: qty 1

## 2020-01-30 MED ORDER — GABAPENTIN 300 MG PO CAPS
300.0000 mg | ORAL_CAPSULE | Freq: Three times a day (TID) | ORAL | Status: DC
  Administered 2020-01-30 – 2020-02-11 (×35): 300 mg via ORAL
  Filled 2020-01-30 (×35): qty 1

## 2020-01-30 MED ORDER — FLUTICASONE PROPIONATE 50 MCG/ACT NA SUSP: 1.0000 | Freq: Two times a day (BID) | NASAL | Status: DC | PRN

## 2020-01-30 MED ORDER — DOCUSATE SODIUM 100 MG PO CAPS
100.0000 mg | ORAL_CAPSULE | Freq: Two times a day (BID) | ORAL | Status: DC
  Administered 2020-01-30 – 2020-02-10 (×22): 100 mg via ORAL
  Filled 2020-01-30 (×24): qty 1

## 2020-01-30 MED ORDER — ONDANSETRON HCL 4 MG/2ML IJ SOLN: 4.0000 mg | Freq: Four times a day (QID) | INTRAMUSCULAR | Status: DC | PRN

## 2020-01-30 MED ORDER — BISACODYL 10 MG RE SUPP: 10.0000 mg | Freq: Every day | RECTAL | Status: DC | PRN

## 2020-01-30 MED ORDER — GABAPENTIN 300 MG PO CAPS: 300.0000 mg | ORAL_CAPSULE | Freq: Every day | ORAL | Status: DC

## 2020-01-30 MED ORDER — ALBUTEROL SULFATE HFA 108 (90 BASE) MCG/ACT IN AERS
2.0000 | INHALATION_SPRAY | Freq: Four times a day (QID) | RESPIRATORY_TRACT | Status: DC | PRN
  Filled 2020-01-30: qty 6.7

## 2020-01-30 MED ORDER — ONDANSETRON HCL 4 MG PO TABS: 4.0000 mg | ORAL_TABLET | Freq: Four times a day (QID) | ORAL | Status: DC | PRN

## 2020-01-30 MED ORDER — GABAPENTIN 300 MG PO CAPS: 300.0000 mg | ORAL_CAPSULE | Freq: Two times a day (BID) | ORAL | Status: DC

## 2020-01-30 MED ORDER — LEVOTHYROXINE SODIUM 75 MCG PO TABS
75.0000 ug | ORAL_TABLET | Freq: Every day | ORAL | Status: DC
  Administered 2020-01-31 – 2020-02-11 (×12): 75 ug via ORAL
  Filled 2020-01-30 (×12): qty 1

## 2020-01-30 MED ORDER — ALUM & MAG HYDROXIDE-SIMETH 200-200-20 MG/5ML PO SUSP
30.0000 mL | ORAL | Status: DC | PRN
  Administered 2020-02-06 – 2020-02-07 (×4): 30 mL via ORAL
  Filled 2020-01-30 (×4): qty 30

## 2020-01-30 MED ORDER — GUAIFENESIN-DM 100-10 MG/5ML PO SYRP: 5.0000 mL | ORAL_SOLUTION | Freq: Four times a day (QID) | ORAL | Status: DC | PRN

## 2020-01-30 MED ORDER — DIPHENHYDRAMINE HCL 12.5 MG/5ML PO ELIX: 12.5000 mg | ORAL_SOLUTION | Freq: Four times a day (QID) | ORAL | Status: DC | PRN

## 2020-01-30 MED ORDER — LORATADINE 10 MG PO TABS
10.0000 mg | ORAL_TABLET | Freq: Every day | ORAL | Status: DC
  Administered 2020-01-31 – 2020-02-11 (×12): 10 mg via ORAL
  Filled 2020-01-30 (×12): qty 1

## 2020-01-30 MED ORDER — TRAMADOL HCL 50 MG PO TABS: 50.0000 mg | ORAL_TABLET | Freq: Four times a day (QID) | ORAL | 0 refills | Status: DC | PRN

## 2020-01-30 MED ORDER — TRAZODONE HCL 50 MG PO TABS: 25.0000 mg | ORAL_TABLET | Freq: Every evening | ORAL | Status: DC | PRN

## 2020-01-30 MED ORDER — TRAMADOL HCL 50 MG PO TABS
50.0000 mg | ORAL_TABLET | Freq: Four times a day (QID) | ORAL | Status: DC | PRN
  Administered 2020-01-31 – 2020-02-01 (×4): 50 mg via ORAL
  Administered 2020-02-01 – 2020-02-02 (×2): 100 mg via ORAL
  Administered 2020-02-03: 50 mg via ORAL
  Administered 2020-02-03: 100 mg via ORAL
  Administered 2020-02-04 – 2020-02-05 (×2): 50 mg via ORAL
  Administered 2020-02-05 – 2020-02-06 (×2): 100 mg via ORAL
  Administered 2020-02-06: 50 mg via ORAL
  Administered 2020-02-07 – 2020-02-09 (×4): 100 mg via ORAL
  Administered 2020-02-09 – 2020-02-11 (×5): 50 mg via ORAL
  Filled 2020-01-30: qty 1
  Filled 2020-01-30 (×2): qty 2
  Filled 2020-01-30 (×2): qty 1
  Filled 2020-01-30 (×2): qty 2
  Filled 2020-01-30: qty 1
  Filled 2020-01-30: qty 2
  Filled 2020-01-30 (×2): qty 1
  Filled 2020-01-30: qty 2
  Filled 2020-01-30: qty 1
  Filled 2020-01-30 (×2): qty 2
  Filled 2020-01-30: qty 1
  Filled 2020-01-30 (×3): qty 2
  Filled 2020-01-30 (×4): qty 1
  Filled 2020-01-30 (×2): qty 2

## 2020-01-30 MED ORDER — FLEET ENEMA 7-19 GM/118ML RE ENEM: 1.0000 | ENEMA | Freq: Once | RECTAL | Status: DC | PRN

## 2020-01-30 MED ORDER — HYDROCODONE-ACETAMINOPHEN 5-325 MG PO TABS
0.5000 | ORAL_TABLET | ORAL | Status: DC | PRN
  Administered 2020-01-31 (×2): 0.5 via ORAL
  Administered 2020-02-01 – 2020-02-06 (×10): 1 via ORAL
  Filled 2020-01-30 (×14): qty 1

## 2020-01-30 MED ORDER — ACETAMINOPHEN 325 MG PO TABS
325.0000 mg | ORAL_TABLET | ORAL | Status: DC | PRN
  Administered 2020-01-31 – 2020-02-02 (×2): 650 mg via ORAL
  Filled 2020-01-30 (×2): qty 2

## 2020-01-30 NOTE — Progress Notes (Signed)
   Subjective: 6 Days Post-Op Procedure(s) (LRB): TOTAL KNEE BILATERAL (Bilateral) Patient reports pain as moderate.   Patient seen in rounds for Dr. Wynelle Link. Patient is a little discouraged this AM due to limited mobility. Explained that this is normal less than a week out from surgery, and is the reason why we're planning on CIR upon discharge versus going home. Using tramadol currently for pain management with relief, discussed frequent icing. Denies chest pain, palpitations or SHOB. No issues overnight.   Objective: Vital signs in last 24 hours: Temp:  [98 F (36.7 C)-99.1 F (37.3 C)] 98 F (36.7 C) (01/11 0527) Pulse Rate:  [71-87] 72 (01/11 0527) Resp:  [13-20] 18 (01/11 0527) BP: (113-144)/(67-96) 144/96 (01/11 0527) SpO2:  [90 %-99 %] 96 % (01/11 0527)  Intake/Output from previous day:  Intake/Output Summary (Last 24 hours) at 01/30/2020 0817 Last data filed at 01/29/2020 1000 Gross per 24 hour  Intake 120 ml  Output -  Net 120 ml    Intake/Output this shift: No intake/output data recorded.  Labs: Recent Labs    01/29/20 0531  HGB 10.1*   Recent Labs    01/29/20 0531  WBC 8.4  RBC 3.19*  HCT 31.2*  PLT 161   No results for input(s): NA, K, CL, CO2, BUN, CREATININE, GLUCOSE, CALCIUM in the last 72 hours. No results for input(s): LABPT, INR in the last 72 hours.  Exam: General - Patient is Alert and Oriented Extremity - Neurologically intact Neurovascular intact Sensation intact distally Dorsiflexion/Plantar flexion intact Dressing/Incision - clean, dry, no drainage Motor Function - intact, moving foot and toes well on exam.   Past Medical History:  Diagnosis Date  . Allergic rhinitis   . Anal fissure    Hx of   . Anxiety    hx of  . Arthritis   . Asthma    none in last year   . Chronic kidney disease    partial nephrectomy  . Depression    hx of  . Diabetes mellitus without complication (HCC)    no meds  . Diverticular disease   .  Dysrhythmia    palpitations  . Eczema   . Endometrial cancer ([place]) 2001   spread to appendix   . Hemorrhoids   . Hyperglycemia   . Hypertension   . Hypothyroidism   . Kidney cancer, primary, with metastasis from kidney to other site [facility])   . Obesity   . Sebaceous cyst   . Thyroid disease   . Thyroid nodule   . 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    Assessment/Plan: 6 Days Post-Op Procedure(s) (LRB): TOTAL KNEE BILATERAL (Bilateral) Principal Problem:   Atrial fibrillation with rapid ventricular response (HCC) Active Problems:   Mild intermittent asthma, uncomplicated   Bilateral primary osteoarthritis of knee   Hypoxia   Hypothyroidism  Estimated body mass index is 33.38 kg/m as calculated from the following:   Height as of this encounter: 5\' 4"  (1.626 m).   Weight as of this encounter: 88.2 kg. Up with therapy  DVT Prophylaxis - Xarelto Weight-bearing as tolerated  Still awaiting bed at CIR, ready for discharge from a orthopedic standpoint once cleared medically. Echo yesterday showed large left pleural effusion. Appreciative of hospitalists recommendations.   Theresa Duty, PA-C Orthopedic Surgery [phone] 01/30/2020, 8:17 AM

## 2020-01-30 NOTE — Progress Notes (Addendum)
PROGRESS NOTE  Melissa Velazquez [id number] DOB: Oct 22, 1950 DOA: 01/24/2020 PCP: [name], MD   LOS: 6 days   Brief narrative:  Melissa Velazquez is an 70 y.o. female with a past medical history of hypertension, hypothyroidism, diet-controlled type II diabetes, mild intermittent asthma, depression, bilateral knee osteoarthritis status post bilateral total knee arthroplasty on 01/24/2020 currently admitted under orthopedic service was seen by medical team for atrial fibrillation with rapid ventricular response with some hypotension.  Patient did have paroxysmal atrial fibrillation and currently on sinus rhythm.  Blood pressure has improved.  Currently awaiting for CIR placement to  Assessment/Plan:  Principal Problem:   Atrial fibrillation with rapid ventricular response (HCC) Active Problems:   Mild intermittent asthma, uncomplicated   Bilateral primary osteoarthritis of knee   Hypoxia   Hypothyroidism  New onset A. fib with RVR:  No previous history of atrial fibrillation.  Normal sinus rhythm at this time.  Patient will need to follow-up with cardiology as outpatient.  Patient is already on Xarelto for anticoagulation for the knees.  CHA2DS2VASc 4, continue Xarelto for anticoagulation.    2D echocardiogram from 01/29/2020 showed LV ejection fraction of 60 to 65% with left ventricular hypertrophy and moderate pulmonary artery systolic pressure elevation.  TSH is within normal limits.  Will benefit from outpatient cardiology follow-up after discharge.  Hypoxia:  Currently on 2 L of nasal cannula oxygen.  Wean wean as able.  Initial Blood gas showing mild hypoxia. Chest x-ray showing no acute process.  Lungs clear on exam.     Avoid opiates/ sedating medications as possible   Hypotension.  Has improved.  Not on any antihypertensives at home.  Hypothyroidism: Continue home Synthroid.    TSH level within normal limits   Diet controlled type 2 diabetes: A1c 5.1 on 01/18/2020.  Continue  diabetic diet.  Sliding scale insulin   Mild intermittent asthma: Stable.    No wheezing. She uses only as needed rescue inhaler at home.  Continue albuterol as needed.  Depression: Continue home medication  Bilateral knee osteoarthritis status post bilateral total knee arthroplasty on 01/24/2020: Management per primary team  Disposition: ok to dc to rehab. No need for Zio patch. Would recommend cardiology follow as outpatient   DVT prophylaxis: rivaroxaban (XARELTO) tablet 10 mg Start: 01/27/20 1000 SCDs Start: 01/24/20 1725 Place TED hose Start: 01/24/20 1725 rivaroxaban (XARELTO) tablet 10 mg    Code Status: Full code  Family Communication: None  Status is: Inpatient  Procedures: Bilateral knee osteoarthritis status post bilateral total knee arthroplasty on 01/24/2020  Anti-inflectives:  . None  Anti-infectives (From admission, onward)   Start     Dose/Rate Route Frequency Ordered Stop   01/24/20 1830  ceFAZolin (ANCEF) IVPB 2g/100 mL premix        2 g 200 mL/hr over 30 Minutes Intravenous Every 6 hours 01/24/20 1724 01/25/20 0016   01/24/20 1030  ceFAZolin (ANCEF) IVPB 2g/100 mL premix        2 g 200 mL/hr over 30 Minutes Intravenous On call to O.R. 01/24/20 1022 01/24/20 1201       Subjective: Today, patient was seen and examined at bedside.  Denies any pain, nausea, vomiting, chest pain or shortness of breath.  Objective: Vitals:   01/29/20 1636 01/30/20 0527  BP: (!) 141/69 (!) 144/96  Pulse: 79 72  Resp: 13 18  Temp: 98.5 F (36.9 C) 98 F (36.7 C)  SpO2: 91% 96%    Intake/Output Summary (Last 24 hours) at  01/30/2020 0939 Last data filed at 01/30/2020 0700 Gross per 24 hour  Intake 360 ml  Output --  Net 360 ml   Filed Weights   01/24/20 2340  Weight: 88.2 kg   Body mass index is 33.38 kg/m.   Physical Exam:  GENERAL: Patient is alert awake and oriented. Not in obvious distress.   on nasal cannula oxygen, obese HENT: No scleral pallor or  icterus. Pupils equally reactive to light. Oral mucosa is moist NECK: is supple, no gross swelling noted. CHEST: Clear to auscultation. No crackles or wheezes.  Diminished breath sounds bilaterally. CVS: S1 and S2 heard, no murmur. Regular rate and rhythm.  ABDOMEN: Soft, non-tender, bowel sounds are present. EXTREMITIES: No edema.  Total knee arthroplasty with edema and dressing CNS: Cranial nerves are intact. No focal motor deficits. SKIN: warm and dry without rashes.  Bilateral knee arthroplasty  Data Review: I have personally reviewed the following laboratory data and studies,  CBC: Recent Labs  Lab 01/25/20 0417 01/26/20 0353 01/27/20 0328 01/29/20 0531  WBC 9.6 6.6 7.9 8.4  HGB 11.7* 11.3* 10.9* 10.1*  HCT 35.8* 34.2* 33.9* 31.2*  MCV 95.7 97.7 98.5 97.8  PLT 144* 111* 106* [id number]   Basic Metabolic Panel: Recent Labs  Lab 01/25/20 0417 01/26/20 0353  NA 139 141  K 4.0 4.1  CL 104 107  CO2 28 26  GLUCOSE 136* 108*  BUN 16 16  CREATININE 1.12* 0.87  CALCIUM 8.7* 8.6*   Liver Function Tests: No results for input(s): AST, ALT, ALKPHOS, BILITOT, PROT, ALBUMIN in the last 168 hours. No results for input(s): LIPASE, AMYLASE in the last 168 hours. No results for input(s): AMMONIA in the last 168 hours. Cardiac Enzymes: No results for input(s): CKTOTAL, CKMB, CKMBINDEX, TROPONINI in the last 168 hours. BNP (last 3 results) No results for input(s): BNP in the last 8760 hours.  ProBNP (last 3 results) No results for input(s): PROBNP in the last 8760 hours.  CBG: Recent Labs  Lab 01/24/20 1035 01/24/20 1422 01/29/20 0203  GLUCAP 95 82 146*   Recent Results (from the past 240 hour(s))  SARS CORONAVIRUS 2 (TAT 6-24 HRS) Nasopharyngeal Nasopharyngeal Swab     Status: None   Collection Time: 01/23/20  2:19 PM   Specimen: Nasopharyngeal Swab  Result Value Ref Range Status   SARS Coronavirus 2 NEGATIVE NEGATIVE Final    Comment: (NOTE) SARS-CoV-2 target nucleic acids  are NOT DETECTED.  The SARS-CoV-2 RNA is generally detectable in upper and lower respiratory specimens during the acute phase of infection. Negative results do not preclude SARS-CoV-2 infection, do not rule out co-infections with other pathogens, and should not be used as the sole basis for treatment or other patient management decisions. Negative results must be combined with clinical observations, patient history, and epidemiological information. The expected result is Negative.  Fact Sheet for Patients: SugarRoll.be  Fact Sheet for Healthcare Providers: https://www.woods-mathews.com/  This test is not yet approved or cleared by the [place] FDA and  has been authorized for detection and/or diagnosis of SARS-CoV-2 by FDA under an Emergency Use Authorization (EUA). This EUA will remain  in effect (meaning this test can be used) for the duration of the COVID-19 declaration under Se ction 564(b)(1) of the Act, 21 U.S.C. section 360bbb-3(b)(1), unless the authorization is terminated or revoked sooner.  Performed at [facility], [place] [address], [name], [place] [address]      Studies: DG CHEST PORT 1 VIEW  Result  Date: 01/29/2020 CLINICAL DATA:  70 year old female with shortness of breath, pleural effusion noted on echocardiogram. EXAM: PORTABLE CHEST 1 VIEW COMPARISON:  Earlier the same day FINDINGS: The heart size and mediastinal contours are within normal limits. Both lungs are clear. The visualized skeletal structures are unremarkable. IMPRESSION: No acute cardiopulmonary process. No radiographic evidence of significant pleural effusion. Electronically Signed   By: Ruthann Cancer MD   On: 01/29/2020 15:45   DG CHEST PORT 1 VIEW  Result Date: 01/29/2020 CLINICAL DATA:  Shortness of breath EXAM: PORTABLE CHEST 1 VIEW COMPARISON:  10/25/2018 FINDINGS: Normal heart size and aortic contours. Hazy opacity at the medial right base  correlates with fat pad on 2009 CT and is stable from prior chest x-ray. No acute infiltrate or edema. No effusion or pneumothorax. No acute osseous findings. Artifact from EKG leads. IMPRESSION: Negative portable chest. Electronically Signed   By: Monte Fantasia M.D.   On: 01/29/2020 04:58   ECHOCARDIOGRAM COMPLETE  Result Date: 01/29/2020    ECHOCARDIOGRAM REPORT   Patient Name:   Melissa Velazquez Date of Exam: 01/29/2020 Medical Rec #:  [id number]       Height:       64.0 in Accession #:    [id number]      Weight:       194.4 lb Date of Birth:  Oct 24, 1950        BSA:          1.933 m Patient Age:    69 years        BP:           127/84 mmHg Patient Gender: F               HR:           87 bpm. Exam Location:  Inpatient Procedure: 2D Echo, Color Doppler and Cardiac Doppler Indications:    Atrial fibrillation  History:        Patient has no prior history of Echocardiogram examinations.                 Risk Factors:Hypertension.  Sonographer:    Clayton Lefort RDCS (AE) Referring Phys: [id number] [place]  1. Left ventricular ejection fraction, by estimation, is 60 to 65%. The left ventricle has normal function. The left ventricle has no regional wall motion abnormalities. There is mild concentric left ventricular hypertrophy and moderate basal septal hypertrophy.  2. Right ventricular systolic function is mildly reduced. The right ventricular size is normal. There is moderately elevated pulmonary artery systolic pressure. The estimated right ventricular systolic pressure is 15.4 mmHg.  3. Large pleural effusion in the left lateral region.  4. The mitral valve is normal in structure. No evidence of mitral valve regurgitation. No evidence of mitral stenosis.  5. The aortic valve is tricuspid. Aortic valve regurgitation is not visualized. Mild aortic valve sclerosis is present, with no evidence of aortic valve stenosis. Aortic valve area, by VTI measures 2.41 cm. Aortic valve mean gradient measures 4.0  mmHg.  Aortic valve Vmax measures 1.51 m/s.  6. The inferior vena cava is dilated in size with <50% respiratory variability, suggesting right atrial pressure of 15 mmHg.  7. Tricuspid valve regurgitation is mild to moderate. FINDINGS  Left Ventricle: Left ventricular ejection fraction, by estimation, is 60 to 65%. The left ventricle has normal function. The left ventricle has no regional wall motion abnormalities. The left ventricular internal cavity size was normal in size. There is  mild concentric left ventricular hypertrophy. Left ventricular diastolic parameters are consistent with Grade I diastolic dysfunction (impaired relaxation). Normal left ventricular filling pressure. Right Ventricle: The right ventricular size is normal. No increase in right ventricular wall thickness. Right ventricular systolic function is mildly reduced. There is moderately elevated pulmonary artery systolic pressure. The tricuspid regurgitant velocity is 2.88 m/s, and with an assumed right atrial pressure of 15 mmHg, the estimated right ventricular systolic pressure is 99991111 mmHg. Left Atrium: Left atrial size was normal in size. Right Atrium: Right atrial size was normal in size. Pericardium: There is no evidence of pericardial effusion. Mitral Valve: The mitral valve is normal in structure. Mild mitral annular calcification. No evidence of mitral valve regurgitation. No evidence of mitral valve stenosis. MV peak gradient, 4.0 mmHg. The mean mitral valve gradient is 2.0 mmHg. Tricuspid Valve: The tricuspid valve is normal in structure. Tricuspid valve regurgitation is mild to moderate. No evidence of tricuspid stenosis. Aortic Valve: The aortic valve is tricuspid. Aortic valve regurgitation is not visualized. Mild aortic valve sclerosis is present, with no evidence of aortic valve stenosis. Aortic valve mean gradient measures 4.0 mmHg. Aortic valve peak gradient measures 9.1 mmHg. Aortic valve area, by VTI measures 2.41 cm. Pulmonic  Valve: The pulmonic valve was normal in structure. Pulmonic valve regurgitation is not visualized. No evidence of pulmonic stenosis. Aorta: The aortic root is normal in size and structure. Venous: The inferior vena cava is dilated in size with less than 50% respiratory variability, suggesting right atrial pressure of 15 mmHg. IAS/Shunts: No atrial level shunt detected by color flow Doppler. Additional Comments: There is a large pleural effusion in the left lateral region.  LEFT VENTRICLE PLAX 2D LVIDd:         3.60 cm  Diastology LVIDs:         2.40 cm  LV e' medial:    7.07 cm/s LV PW:         1.20 cm  LV E/e' medial:  11.2 LV IVS:        1.50 cm  LV e' lateral:   7.29 cm/s LVOT diam:     1.80 cm  LV E/e' lateral: 10.9 LV SV:         66 LV SV Index:   34 LVOT Area:     2.54 cm  RIGHT VENTRICLE             IVC RV Basal diam:  3.00 cm     IVC diam: 2.60 cm RV S prime:     11.50 cm/s TAPSE (M-mode): 1.6 cm LEFT ATRIUM             Index       RIGHT ATRIUM           Index LA diam:        3.10 cm 1.60 cm/m  RA Area:     12.00 cm LA Vol (A2C):   48.1 ml 24.88 ml/m RA Volume:   24.60 ml  12.73 ml/m LA Vol (A4C):   27.9 ml 14.43 ml/m LA Biplane Vol: 40.6 ml 21.00 ml/m  AORTIC VALVE AV Area (Vmax):    2.28 cm AV Area (Vmean):   2.15 cm AV Area (VTI):     2.41 cm AV Vmax:           151.00 cm/s AV Vmean:          92.500 cm/s AV VTI:  0.276 m AV Peak Grad:      9.1 mmHg AV Mean Grad:      4.0 mmHg LVOT Vmax:         135.00 cm/s LVOT Vmean:        78.300 cm/s LVOT VTI:          0.261 m LVOT/AV VTI ratio: 0.95  AORTA Ao Root diam: 3.10 cm Ao Asc diam:  3.30 cm MITRAL VALVE               TRICUSPID VALVE MV Area (PHT): 4.31 cm    TR Peak grad:   33.2 mmHg MV Peak grad:  4.0 mmHg    TR Vmax:        288.00 cm/s MV Mean grad:  2.0 mmHg MV Vmax:       1.00 m/s    SHUNTS MV Vmean:      58.3 cm/s   Systemic VTI:  0.26 m MV Decel Time: 176 msec    Systemic Diam: 1.80 cm MV E velocity: 79.30 cm/s MV A velocity: 99.00  cm/s MV E/A ratio:  0.80 Fransico Him MD Electronically signed by Fransico Him MD Signature Date/Time: 01/29/2020/1:29:49 PM    Final       Flora Lipps, MD  [facility] 01/30/2020  If 7PM-7AM, please contact night-coverage

## 2020-01-30 NOTE — H&P (Signed)
Physical Medicine and Rehabilitation Admission H&P    CC: Functional deficits due to B-TKR complicated by cardiac issues.   HPI:  Melissa Velazquez is a 70 year old female with history of HTN, mild asthma, glucose intolerance, IBS- diarrhea, bilateral Knee OA with failure of conservative therapy and elected to undergo B-TKR on 01/24/19 by Dr. Wynelle Link.  Post surgery ABLA has been stable and thrombocytopenia has resolved.  On early a;m of 01/29/19,  she developed A fib with RVR and presyncope with BP 84/45 and HR 170's with hypoxia requiring su pplemental oxygen and she converted to NSR with IV BB per Dr. Marlowe Sax with [facility]. She received fluid bolus and oxycodone/robaxin were d/c.   2D echo done revealing EF 60-65% with mild concentric and moderate basal septal hypertrophy, moderate PAH as well as large pleural effusion in left lateral region. Follow up CXR was negative for significant pleural effusion. She continues to require supplemental oxygen with recommendations to wean as able as well as follow up with cardiology--seen by Dr. Harl Bowie for cardiac clearance pre-op. To continue Xarelto which was added for DVT prophylaxis --[id number]- 4. Therapy has been ongoing but continues to be limited by weakness and pain affecting ADLs and mobility. CIR was recommended due to functional decline.    Pt reports pain is 3-4/10 at rest, 7-8/10 with activity in spite of Tramadol- wants to increase dose, but Oxy makes her feel weird/bad.  LBM today- denies constipation Voiding OK  Review of Systems  Constitutional: Negative for chills and fever.  Eyes: Negative for blurred vision and double vision.  Respiratory: Negative for cough and shortness of breath.   Cardiovascular: Positive for palpitations (occasionally). Negative for chest pain.  Gastrointestinal: Negative for abdominal pain, heartburn and nausea.  Genitourinary: Negative for dysuria and hematuria.  Musculoskeletal: Positive for joint pain and  myalgias.  Neurological: Positive for weakness. Negative for dizziness and headaches.  All other systems reviewed and are negative.     Past Medical History:  Diagnosis Date  . Allergic rhinitis   . Anal fissure    Hx of   . Anxiety    hx of  . Arthritis   . Asthma    none in last year   . Chronic kidney disease    partial nephrectomy  . Depression    hx of  . Diabetes mellitus without complication (HCC)    no meds  . Diverticular disease   . Dysrhythmia    palpitations  . Eczema   . Endometrial cancer ([place]) 2001   spread to appendix   . Hemorrhoids   . Hyperglycemia   . Hypertension   . Hypothyroidism   . Kidney cancer, primary, with metastasis from kidney to other site [facility])   . Obesity   . Sebaceous cyst   . Thyroid disease   . Thyroid nodule   . 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 APPENDECTOMY  2008  . BIOPSY  12/14/2016   Procedure: BIOPSY;  Surgeon: Rogene Houston, MD;  [facility];  Service: Endoscopy;;  colon  . CHOLECYSTECTOMY  2008  . COLONOSCOPY N/A 11/24/2012   Procedure: COLONOSCOPY;  Surgeon: Rogene Houston, MD;  [facility];  Service: Endoscopy;  Laterality: N/A;  830  . COLONOSCOPY N/A 12/14/2016   Procedure: COLONOSCOPY;  Surgeon: Rogene Houston, MD;  [facility]: [facility];  Service: Endoscopy;  Laterality: N/A;  12:00  .  POLYPECTOMY  12/14/2016   Procedure: POLYPECTOMY;  Surgeon: Rogene Houston, MD;  Location: [facility];  Service: Endoscopy;;  colon  . ROBOTIC ASSITED PARTIAL NEPHRECTOMY Left 08/21/2016   Procedure: XI ROBOTIC ASSITED PARTIAL NEPHRECTOMY;  Surgeon: Ardis Hughs, MD;  Location: [facility];  Service: Urology;  Laterality: Left;  . TOTAL ABDOMINAL HYSTERECTOMY  2001  . TOTAL KNEE ARTHROPLASTY Bilateral 01/24/2020   Procedure: TOTAL KNEE BILATERAL;  Surgeon: Gaynelle Arabian, MD;  Location: [facility];  Service: Orthopedics;  Laterality:  Bilateral;  . WISDOM TOOTH EXTRACTION      Family History  Problem Relation Age of Onset  . Breast cancer Mother   . Diabetes Mother   . Obesity Sister   . Asthma Sister   . Eczema Sister   . COPD Father        smoked  . Prostate cancer Father   . Breast cancer Maternal Aunt   . Urticaria Maternal Grandfather   . Colon cancer Neg Hx     Social History:  Lives alone and independent PTA. Works as Engineer, water.  reports that she quit smoking about 22 years ago. Her smoking use included cigarettes. She has a 20.00 pack-year smoking history. She has never used smokeless tobacco. She reports current alcohol use. She reports that she does not use drugs.   Allergies  Allergen Reactions  . Triple Antibiotic [Bacitracin-Neomycin-Polymyxin] Anaphylaxis  . Levofloxacin Other (See Comments)    Stomach upset  . Oxycodone     Tremors and brain fog  . Capsaicin Rash  . Elastic Bandages & [Zinc] Rash  . Nickel Itching and Rash    Medications Prior to Admission  Medication Sig Dispense Refill  . albuterol (PROVENTIL HFA;VENTOLIN HFA) 108 (90 Base) MCG/ACT inhaler Inhale 2 puffs into the lungs every 6 (six) hours as needed for wheezing or shortness of breath.    Marland Kitchen azelastine (ASTELIN) 0.1 % nasal spray Place 1 spray into both nostrils 2 (two) times daily as needed for rhinitis. (Patient not taking: Reported on 01/09/2020) 30 mL 5  . cetirizine (ZYRTEC) 10 MG tablet Take 10 mg by mouth daily as needed for allergies.    Marland Kitchen docusate sodium (COLACE) 100 MG capsule Take 1 capsule (100 mg total) by mouth 2 (two) times daily. 10 capsule 0  . fluticasone (FLONASE) 50 MCG/ACT nasal spray Place 1 spray into both nostrils 2 (two) times daily as needed for allergies or rhinitis. 16 g 5  . gabapentin (NEURONTIN) 300 MG capsule Take a 300 mg capsule three times a day for two weeks following surgery.Then take a 300 mg capsule two times a day for two weeks. Then take a 300 mg capsule once a day for two weeks.  Then discontinue. 84 capsule 0  . levocetirizine (XYZAL) 5 MG tablet Take 1 tablet (5 mg total) by mouth every evening. (Patient not taking: Reported on 01/09/2020) 30 tablet 5  . levothyroxine (SYNTHROID, LEVOTHROID) 75 MCG tablet Take 75 mcg by mouth daily before breakfast.    . methocarbamol (ROBAXIN) 500 MG tablet Take 1 tablet (500 mg total) by mouth every 6 (six) hours as needed for muscle spasms. 40 tablet 0  . ondansetron (ZOFRAN) 4 MG tablet Take 1 tablet (4 mg total) by mouth every 6 (six) hours as needed for nausea. 20 tablet 0  . oxyCODONE (OXY IR/ROXICODONE) 5 MG immediate release tablet Take 1-3 tablets (5-15 mg total) by mouth every 6 (six) hours as needed for moderate pain or severe pain.  56 tablet 0  . polyethylene glycol (MIRALAX / GLYCOLAX) 17 g packet Take 17 g by mouth daily as needed for mild constipation. 14 each 0  . rivaroxaban (XARELTO) 10 MG TABS tablet Take 1 tablet (10 mg total) by mouth daily. For three weeks, beginning 01/27/2020 and ending 02/17/2020. Then take one 81 mg aspirin once a day for three weeks. Then discontinue aspirin. 21 tablet 0  . sertraline (ZOLOFT) 100 MG tablet Take 150 mg by mouth daily with breakfast.     . traMADol (ULTRAM) 50 MG tablet Take 1-2 tablets (50-100 mg total) by mouth every 6 (six) hours as needed (can have every 4-6 hours prn). 40 tablet 0  . triamcinolone cream (KENALOG) 0.1 % Apply 1 application topically daily as needed (rash).      Drug Regimen Review  Drug regimen was reviewed and remains appropriate with no significant issues identified  Home: Home Living Family/patient expects to be discharged to:: Private residence Living Arrangements: Alone   Functional History:    Functional Status:  Mobility:          ADL:    Cognition: Cognition Orientation Level: Oriented X4     Blood pressure 139/82, pulse 76, temperature 98.9 F (37.2 C), temperature source Oral, resp. rate 18, height 5\' 4"  (1.626 m), weight 93.7  kg, SpO2 96 %. Physical Exam Vitals and nursing note reviewed.  Constitutional:      Appearance: Normal appearance.     Comments: Awake, alert, appropriate, sitting up in bed, NAD  HENT:     Head: Normocephalic and atraumatic.     Comments: Smile equal    Right Ear: External ear normal.     Left Ear: External ear normal.     Nose: Nose normal. No congestion.     Mouth/Throat:     Mouth: Mucous membranes are moist.     Pharynx: Oropharynx is clear. No oropharyngeal exudate.  Eyes:     General:        Right eye: No discharge.        Left eye: No discharge.     Extraocular Movements: Extraocular movements intact.  Cardiovascular:     Rate and Rhythm: Normal rate and regular rhythm.     Heart sounds: No murmur heard. No gallop.   Pulmonary:     Comments: CTA B/L- no W/R/R- good air movement Abdominal:     Comments: Soft, NT, ND, (+)BS - hypoactive  Musculoskeletal:     Cervical back: Normal range of motion. No rigidity.     Comments: UEs 5/5 in biceps, triceps, WE grip and finger abd LEs- 2/5 in HF B/L; KE/KF NT due to knee surgery, and DF and PF 5/5 B/L Has knees with original surgical dressings on them B/L- no drainage seen- moderate swelling  Skin:    General: Skin is warm and dry.     Comments: B/L knee incision- no other wounds; IV L AC fossa- looks OK  Neurological:     Comments: Intact to light touch in all 4 extremities Ox3  Psychiatric:     Comments: appropriate     Results for orders placed or performed during the hospital encounter of 01/24/20 (from the past 48 hour(s))  Glucose, capillary     Status: Abnormal   Collection Time: 01/29/20  2:03 AM  Result Value Ref Range   Glucose-Capillary 146 (H) 70 - 99 mg/dL    Comment: Glucose reference range applies only to samples taken after fasting for at least  8 hours.  Blood gas, arterial     Status: Abnormal   Collection Time: 01/29/20  4:10 AM  Result Value Ref Range   FIO2 21.00    pH, Arterial 7.489 (H) 7.350  - 7.450   pCO2 arterial 34.7 32.0 - 48.0 mmHg   pO2, Arterial 74.0 (L) 83.0 - 108.0 mmHg   Bicarbonate 26.1 20.0 - 28.0 mmol/L   Acid-Base Excess 3.4 (H) 0.0 - 2.0 mmol/L   O2 Saturation 95.6 %   Patient temperature 98.3    Collection site RIGHT RADIAL    Drawn by [id number]    Allens test (pass/fail) PASS PASS    Comment: Performed at [facility], [place] [address] [place], [place] [address]  TSH     Status: None   Collection Time: 01/29/20  5:31 AM  Result Value Ref Range   TSH 2.872 0.350 - 4.500 uIU/mL    Comment: Performed by a 3rd Generation assay with a functional sensitivity of <=0.01 uIU/mL. Performed at [facility], [place] [address] [place], [place] [address]   CBC     Status: Abnormal   Collection Time: 01/29/20  5:31 AM  Result Value Ref Range   WBC 8.4 4.0 - 10.5 K/uL   RBC 3.19 (L) 3.87 - 5.11 MIL/uL   Hemoglobin 10.1 (L) 12.0 - 15.0 g/dL   HCT 31.2 (L) 36.0 - 46.0 %   MCV 97.8 80.0 - 100.0 fL   MCH 31.7 26.0 - 34.0 pg   MCHC 32.4 30.0 - 36.0 g/dL   RDW 13.7 11.5 - 15.5 %   Platelets 161 150 - 400 K/uL   nRBC 0.0 0.0 - 0.2 %    Comment: Performed at [facility], [place] [address] [place], [place] [address]  D-dimer, quantitative (not at [facility])     Status: Abnormal   Collection Time: 01/29/20  5:52 AM  Result Value Ref Range   D-Dimer, Quant 3.38 (H) 0.00 - 0.50 ug/mL-FEU    Comment: (NOTE) At the manufacturer cut-off value of 0.5 g/mL FEU, this assay has a negative predictive value of 95-100%.This assay is intended for use in conjunction with a clinical pretest probability (PTP) assessment model to exclude pulmonary embolism (PE) and deep venous thrombosis (DVT) in outpatients suspected of PE or DVT. Results should be correlated with clinical presentation. Performed at [facility], [place] [address] [place], [place] [address]    DG CHEST PORT 1 VIEW  Result Date: 01/29/2020 CLINICAL  DATA:  70 year old female with shortness of breath, pleural effusion noted on echocardiogram. EXAM: PORTABLE CHEST 1 VIEW COMPARISON:  Earlier the same day FINDINGS: The heart size and mediastinal contours are within normal limits. Both lungs are clear. The visualized skeletal structures are unremarkable. IMPRESSION: No acute cardiopulmonary process. No radiographic evidence of significant pleural effusion. Electronically Signed   By: Ruthann Cancer MD   On: 01/29/2020 15:45   DG CHEST PORT 1 VIEW  Result Date: 01/29/2020 CLINICAL DATA:  Shortness of breath EXAM: PORTABLE CHEST 1 VIEW COMPARISON:  10/25/2018 FINDINGS: Normal heart size and aortic contours. Hazy opacity at the medial right base correlates with fat pad on 2009 CT and is stable from prior chest x-ray. No acute infiltrate or edema. No effusion or pneumothorax. No acute osseous findings. Artifact from EKG leads. IMPRESSION: Negative portable chest. Electronically Signed   By: Monte Fantasia M.D.   On: 01/29/2020 04:58   ECHOCARDIOGRAM COMPLETE  Result Date: 01/29/2020  ECHOCARDIOGRAM REPORT   Patient Name:   Melissa Velazquez Date of Exam: 01/29/2020 Medical Rec #:  [id number]       Height:       64.0 in Accession #:    [id number]      Weight:       194.4 lb Date of Birth:  [birth year]        BSA:          1.933 m Patient Age:    65 years        BP:           127/84 mmHg Patient Gender: F               HR:           87 bpm. Exam Location:  Inpatient Procedure: 2D Echo, Color Doppler and Cardiac Doppler Indications:    Atrial fibrillation  History:        Patient has no prior history of Echocardiogram examinations.                 Risk Factors:Hypertension.  Sonographer:    Clayton Lefort RDCS (AE) Referring Phys: [id number] [place]  1. Left ventricular ejection fraction, by estimation, is 60 to 65%. The left ventricle has normal function. The left ventricle has no regional wall motion abnormalities. There is mild concentric left  ventricular hypertrophy and moderate basal septal hypertrophy.  2. Right ventricular systolic function is mildly reduced. The right ventricular size is normal. There is moderately elevated pulmonary artery systolic pressure. The estimated right ventricular systolic pressure is 99991111 mmHg.  3. Large pleural effusion in the left lateral region.  4. The mitral valve is normal in structure. No evidence of mitral valve regurgitation. No evidence of mitral stenosis.  5. The aortic valve is tricuspid. Aortic valve regurgitation is not visualized. Mild aortic valve sclerosis is present, with no evidence of aortic valve stenosis. Aortic valve area, by VTI measures 2.41 cm. Aortic valve mean gradient measures 4.0 mmHg.  Aortic valve Vmax measures 1.51 m/s.  6. The inferior vena cava is dilated in size with <50% respiratory variability, suggesting right atrial pressure of 15 mmHg.  7. Tricuspid valve regurgitation is mild to moderate. FINDINGS  Left Ventricle: Left ventricular ejection fraction, by estimation, is 60 to 65%. The left ventricle has normal function. The left ventricle has no regional wall motion abnormalities. The left ventricular internal cavity size was normal in size. There is  mild concentric left ventricular hypertrophy. Left ventricular diastolic parameters are consistent with Grade I diastolic dysfunction (impaired relaxation). Normal left ventricular filling pressure. Right Ventricle: The right ventricular size is normal. No increase in right ventricular wall thickness. Right ventricular systolic function is mildly reduced. There is moderately elevated pulmonary artery systolic pressure. The tricuspid regurgitant velocity is 2.88 m/s, and with an assumed right atrial pressure of 15 mmHg, the estimated right ventricular systolic pressure is 99991111 mmHg. Left Atrium: Left atrial size was normal in size. Right Atrium: Right atrial size was normal in size. Pericardium: There is no evidence of pericardial  effusion. Mitral Valve: The mitral valve is normal in structure. Mild mitral annular calcification. No evidence of mitral valve regurgitation. No evidence of mitral valve stenosis. MV peak gradient, 4.0 mmHg. The mean mitral valve gradient is 2.0 mmHg. Tricuspid Valve: The tricuspid valve is normal in structure. Tricuspid valve regurgitation is mild to moderate. No evidence of tricuspid stenosis. Aortic Valve: The aortic valve is tricuspid. Aortic valve  regurgitation is not visualized. Mild aortic valve sclerosis is present, with no evidence of aortic valve stenosis. Aortic valve mean gradient measures 4.0 mmHg. Aortic valve peak gradient measures 9.1 mmHg. Aortic valve area, by VTI measures 2.41 cm. Pulmonic Valve: The pulmonic valve was normal in structure. Pulmonic valve regurgitation is not visualized. No evidence of pulmonic stenosis. Aorta: The aortic root is normal in size and structure. Venous: The inferior vena cava is dilated in size with less than 50% respiratory variability, suggesting right atrial pressure of 15 mmHg. IAS/Shunts: No atrial level shunt detected by color flow Doppler. Additional Comments: There is a large pleural effusion in the left lateral region.  LEFT VENTRICLE PLAX 2D LVIDd:         3.60 cm  Diastology LVIDs:         2.40 cm  LV e' medial:    7.07 cm/s LV PW:         1.20 cm  LV E/e' medial:  11.2 LV IVS:        1.50 cm  LV e' lateral:   7.29 cm/s LVOT diam:     1.80 cm  LV E/e' lateral: 10.9 LV SV:         66 LV SV Index:   34 LVOT Area:     2.54 cm  RIGHT VENTRICLE             IVC RV Basal diam:  3.00 cm     IVC diam: 2.60 cm RV S prime:     11.50 cm/s TAPSE (M-mode): 1.6 cm LEFT ATRIUM             Index       RIGHT ATRIUM           Index LA diam:        3.10 cm 1.60 cm/m  RA Area:     12.00 cm LA Vol (A2C):   48.1 ml 24.88 ml/m RA Volume:   24.60 ml  12.73 ml/m LA Vol (A4C):   27.9 ml 14.43 ml/m LA Biplane Vol: 40.6 ml 21.00 ml/m  AORTIC VALVE AV Area (Vmax):    2.28 cm  AV Area (Vmean):   2.15 cm AV Area (VTI):     2.41 cm AV Vmax:           151.00 cm/s AV Vmean:          92.500 cm/s AV VTI:            0.276 m AV Peak Grad:      9.1 mmHg AV Mean Grad:      4.0 mmHg LVOT Vmax:         135.00 cm/s LVOT Vmean:        78.300 cm/s LVOT VTI:          0.261 m LVOT/AV VTI ratio: 0.95  AORTA Ao Root diam: 3.10 cm Ao Asc diam:  3.30 cm MITRAL VALVE               TRICUSPID VALVE MV Area (PHT): 4.31 cm    TR Peak grad:   33.2 mmHg MV Peak grad:  4.0 mmHg    TR Vmax:        288.00 cm/s MV Mean grad:  2.0 mmHg MV Vmax:       1.00 m/s    SHUNTS MV Vmean:      58.3 cm/s   Systemic VTI:  0.26 m MV Decel Time: 176 msec  Systemic Diam: 1.80 cm MV E velocity: 79.30 cm/s MV A velocity: 99.00 cm/s MV E/A ratio:  0.80 Fransico Him MD Electronically signed by Fransico Him MD Signature Date/Time: 01/29/2020/1:29:49 PM    Final        Medical Problem List and Plan: 1.  Impaired function secondary to B/L Knee arthroplasties due to end stage arthritis B/L of knees  -patient may  Shower if knees covered  -ELOS/Goals: 10-14 days mod I 2.  Antithrombotics: -DVT/anticoagulation:  Pharmaceutical: Xarelto--will order dopplers due to elevated D dimer/hypoxia  -antiplatelet therapy: N/a 3. Pain Management: Continue tramadol prn.  Ice prn for local measures. Also gave her 50-100 mg q6 hours prn for tramadol  --Will trial low dose vicodin and monitor for side effects 4. Mood: LCSW to follow for evaluation and support.   -antipsychotic agents: N/A 5. Neuropsych: This patient is capable of making decisions on her own behalf. 6. Skin/Wound Care: Monitor wounds for healing.  7. Fluids/Electrolytes/Nutrition: Monitor I/O. Check lytes in am. 8. PAF: Has history of intermittent palpitations (Dr. Harl Bowie) BP/HR tid. Encourage fluid intake.  9. ABLA: Will recheck CBC in am   --monitor for signs of bleeding.  10. Glucose intolerance: Recheck FBS in am.  11. H/o depression: On Zoloft dialy.  12.  Hypoxia: Order flutter valve for pulmonary hygiene  --wean oxygen as able.  --Albuterol MDI prn SOB/wheezing. 13. IBS-diarrhea: Last BM 01/10 Continue colace for now.     Ivan Anchors Love, PA-C 01/30/2020   I have personally performed a face to face diagnostic evaluation of this patient and formulated the key components of the plan.  Additionally, I have personally reviewed laboratory data, imaging studies, as well as relevant notes and concur with the physician assistant's documentation above.   The patient's status has not changed from the original H&P.  Any changes in documentation from the acute care chart have been noted above.     [name], MD 01/30/2020

## 2020-01-30 NOTE — Progress Notes (Signed)
Report given to receiving nurse at [facility], Will leave IV access in per request. Pt agreeable to transfer. No changes in initial am assessment at this time.

## 2020-01-30 NOTE — Progress Notes (Signed)
Inpatient Rehab Admissions Coordinator:    I have a bed available for pt to admit to CIR today. Dr. Louanne Belton and Laureen Ochs, PA-C, with ortho in agreement.  TOC team and pt/family aware.  Will arrange transport with [facility] once room on CIR is ready.   Shann Medal, PT, DPT Admissions Coordinator [phone] 01/30/20  11:04 AM

## 2020-01-30 NOTE — Progress Notes (Signed)
Patient admitted to 4W14-A. Pt a&ox4 no complaints of pain. Oriented to unit and fall and visitor policies

## 2020-01-30 NOTE — TOC Progression Note (Signed)
Transition of Care [facility]) - Progression Note    Patient Details  Name: Melissa Velazquez MRN: [id number] Date of Birth: [birth year]  Transition of Care [facility]) CM/SW Contact  Ross Ludwig, [place] Phone Number: 01/30/2020, 11:00 AM  Clinical Narrative:     CSW was informed patient has a bed at CIR for today once a room is cleaned.  CSW notified bedside nurse.      Expected Discharge Plan and Services           Expected Discharge Date: 01/30/20                                     Social Determinants of Health (SDOH) Interventions    Readmission Risk Interventions No flowsheet data found.

## 2020-01-30 NOTE — Discharge Summary (Signed)
Physician Discharge Summary   Patient ID: Melissa Velazquez MRN: [id number] DOB/AGE: 09/07/1950 70 y.o.  Admit date: 01/24/2020 Discharge date: 01/30/2020  Primary Diagnosis: Osteoarthritis, bilateral knees   Admission Diagnoses:  Past Medical History:  Diagnosis Date  . Allergic rhinitis   . Anal fissure    Hx of   . Anxiety    hx of  . Arthritis   . Asthma    none in last year   . Chronic kidney disease    partial nephrectomy  . Depression    hx of  . Diabetes mellitus without complication (HCC)    no meds  . Diverticular disease   . Dysrhythmia    palpitations  . Eczema   . Endometrial cancer ([place]) 2001   spread to appendix   . Hemorrhoids   . Hyperglycemia   . Hypertension   . Hypothyroidism   . Kidney cancer, primary, with metastasis from kidney to other site [facility])   . Obesity   . Sebaceous cyst   . Thyroid disease   . Thyroid nodule   . 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   Weight as of this encounter: 88.2 kg.  Procedure:  Procedure(s) (LRB): TOTAL KNEE BILATERAL (Bilateral)   Consults: Hospitalist service  HPI: Melissa Velazquez is a 70 y.o. year old female with end stage OA of both knees with progressively worsening pain and dysfunction. The patient has constant pain, with activity and at rest and significant functional deficits with difficulties even with ADLs. The patient has had extensive non-op management including analgesics, injections of cortisone and viscosupplements, and home exercise program, but remains in significant pain with significant dysfunction. We discussed replacing both  knees in the same setting versus one at a time including procedure, risks, potential complications, rehab course, and pros and cons associated with each and the patient elects to do both knees at the same time. The patient presents now for bilateral Total Knee Arthroplasty.   Laboratory Data: Admission on 01/24/2020  Component Date Value Ref Range Status  . Glucose-Capillary 01/24/2020 95  70 - 99 mg/dL Final   Glucose reference range applies only to samples taken after fasting for at least 8 hours.  . Glucose-Capillary 01/24/2020 82  70 - 99 mg/dL Final   Glucose reference range applies only to samples taken after fasting for at least 8 hours.  . Comment 1 01/24/2020 Document in Chart   Final  . WBC 01/25/2020 9.6  4.0 - 10.5 K/uL Final  . RBC 01/25/2020 3.74* 3.87 - 5.11 MIL/uL Final  . Hemoglobin 01/25/2020 11.7* 12.0 - 15.0 g/dL Final  . HCT 01/25/2020 35.8* 36.0 - 46.0 % Final  . MCV 01/25/2020 95.7  80.0 - 100.0 fL Final  . MCH 01/25/2020 31.3  26.0 - 34.0 pg Final  . MCHC 01/25/2020 32.7  30.0 - 36.0 g/dL Final  . RDW 01/25/2020 13.1  11.5 - 15.5 % Final  . Platelets 01/25/2020 144* 150 - 400 K/uL Final  . nRBC 01/25/2020 0.0  0.0 - 0.2 % Final   Performed at [facility], [place] [address], [place], [place] [address]  .  Sodium 01/25/2020 139  135 - 145 mmol/L Final  . Potassium 01/25/2020 4.0  3.5 - 5.1 mmol/L Final  . Chloride 01/25/2020 104  98 - 111 mmol/L Final  . CO2 01/25/2020 28  22 - 32 mmol/L Final  . Glucose, Bld 01/25/2020 136* 70 - 99 mg/dL Final   Glucose reference range applies only to samples taken after fasting for at least 8 hours.  . BUN 01/25/2020 16  8 - 23 mg/dL Final  . Creatinine, Ser 01/25/2020 1.12* 0.44 - 1.00 mg/dL Final  . Calcium 03/40/3524 8.7* 8.9 - 10.3 mg/dL Final  . GFR, Estimated 01/25/2020 54* >60 mL/min Final   Comment: (NOTE) Calculated using the CKD-EPI Creatinine Equation (2021)   . Anion gap 01/25/2020 7  5 - 15 Final    Performed at [facility], [address], [place], [place] [address]  . WBC 01/26/2020 6.6  4.0 - 10.5 K/uL Final  . RBC 01/26/2020 3.50* 3.87 - 5.11 MIL/uL Final  . Hemoglobin 01/26/2020 11.3* 12.0 - 15.0 g/dL Final  . HCT 09/31/1216 34.2* 36.0 - 46.0 % Final  . MCV 01/26/2020 97.7  80.0 - 100.0 fL Final  . MCH 01/26/2020 32.3  26.0 - 34.0 pg Final  . MCHC 01/26/2020 33.0  30.0 - 36.0 g/dL Final  . RDW 24/46/9507 13.4  11.5 - 15.5 % Final  . Platelets 01/26/2020 111* 150 - 400 K/uL Final   Comment: REPEATED TO VERIFY PLATELETS APPEAR DECREASED SPECIMEN CHECKED FOR CLOTS Immature Platelet Fraction may be clinically indicated, consider ordering this additional test [id number]   . nRBC 01/26/2020 0.0  0.0 - 0.2 % Final   Performed at [facility], [address], [place], [place] [address]  . Sodium 01/26/2020 141  135 - 145 mmol/L Final  . Potassium 01/26/2020 4.1  3.5 - 5.1 mmol/L Final  . Chloride 01/26/2020 107  98 - 111 mmol/L Final  . CO2 01/26/2020 26  22 - 32 mmol/L Final  . Glucose, Bld 01/26/2020 108* 70 - 99 mg/dL Final   Glucose reference range applies only to samples taken after fasting for at least 8 hours.  . BUN 01/26/2020 16  8 - 23 mg/dL Final  . Creatinine, Ser 01/26/2020 0.87  0.44 - 1.00 mg/dL Final  . Calcium 25/18/9842 8.6* 8.9 - 10.3 mg/dL Final  . GFR, Estimated 01/26/2020 >60  >60 mL/min Final   Comment: (NOTE) Calculated using the CKD-EPI Creatinine Equation (2021)   . Anion gap 01/26/2020 8  5 - 15 Final   Performed at [facility], [address] [place], [place] [address]  . WBC 01/27/2020 7.9  4.0 - 10.5 K/uL Final  . RBC 01/27/2020 3.44* 3.87 - 5.11 MIL/uL Final  . Hemoglobin 01/27/2020 10.9* 12.0 - 15.0 g/dL Final  . HCT 81/18/8677 33.9* 36.0 - 46.0 % Final  . MCV 01/27/2020 98.5  80.0 - 100.0 fL Final  . MCH 01/27/2020 31.7  26.0 - 34.0 pg Final  . MCHC 01/27/2020 32.2  30.0 - 36.0  g/dL Final  . RDW 37/36/6815 13.6  11.5 - 15.5 % Final  . Platelets 01/27/2020 106* 150 - 400 K/uL Final   Comment: REPEATED TO VERIFY PLATELET COUNT CONFIRMED BY SMEAR SPECIMEN CHECKED FOR CLOTS Immature Platelet Fraction may be clinically indicated, consider ordering this additional test [id number]   . nRBC 01/27/2020 0.0  0.0 - 0.2 % Final   Performed at [facility], [address] [place],  [place] [id number]  . Glucose-Capillary 01/29/2020 146* 70 - 99 mg/dL Final   Glucose reference range applies only to samples taken after fasting for at least 8 hours.  Marland Kitchen. FIO2 01/29/2020 21.00   Final  . pH, Arterial 01/29/2020 7.489* 7.350 - 7.450 Final  . pCO2 arterial 01/29/2020 34.7  32.0 - 48.0 mmHg Final  . pO2, Arterial 01/29/2020 74.0* 83.0 - 108.0 mmHg Final  . Bicarbonate 01/29/2020 26.1  20.0 - 28.0 mmol/L Final  . Acid-Base Excess 01/29/2020 3.4* 0.0 - 2.0 mmol/L Final  . O2 Saturation 01/29/2020 95.6  % Final  . Patient temperature 01/29/2020 98.3   Final  . Collection site 01/29/2020 RIGHT RADIAL   Final  . Drawn by 01/29/2020 [id number]   Final  . Allens test (pass/fail) 01/29/2020 PASS  PASS Final   Performed at [facility], [address] [place], [place] [address]  . Weight 01/29/2020 3,111.13  oz Final  . Height 01/29/2020 64  in Final  . BP 01/29/2020 127/84  mmHg Final  . S' Lateral 01/29/2020 2.40  cm Final  . AR max vel 01/29/2020 2.28  cm2 Final  . AV Area VTI 01/29/2020 2.41  cm2 Final  . AV Mean grad 01/29/2020 4.0  mmHg Final  . AV Peak grad 01/29/2020 9.1  mmHg Final  . Ao pk vel 01/29/2020 1.51  m/s Final  . Area-P 1/2 01/29/2020 4.31  cm2 Final  . AV Area mean vel 01/29/2020 2.15  cm2 Final  . TSH 01/29/2020 2.872  0.350 - 4.500 uIU/mL Final   Comment: Performed by a 3rd Generation assay with a functional sensitivity of <=0.01 uIU/mL. Performed at [facility], [address] [place], [place] [address]    . D-Dimer, Quant 01/29/2020 3.38* 0.00 - 0.50 ug/mL-FEU Final   Comment: (NOTE) At the manufacturer cut-off value of 0.5 g/mL FEU, this assay has a negative predictive value of 95-100%.This assay is intended for use in conjunction with a clinical pretest probability (PTP) assessment model to exclude pulmonary embolism (PE) and deep venous thrombosis (DVT) in outpatients suspected of PE or DVT. Results should be correlated with clinical presentation. Performed at [facility], [address] [place], [place] [address]   . WBC 01/29/2020 8.4  4.0 - 10.5 K/uL Final  . RBC 01/29/2020 3.19* 3.87 - 5.11 MIL/uL Final  . Hemoglobin 01/29/2020 10.1* 12.0 - 15.0 g/dL Final  . HCT 69/62/952801/10/2020 31.2* 36.0 - 46.0 % Final  . MCV 01/29/2020 97.8  80.0 - 100.0 fL Final  . MCH 01/29/2020 31.7  26.0 - 34.0 pg Final  . MCHC 01/29/2020 32.4  30.0 - 36.0 g/dL Final  . RDW 41/32/440101/10/2020 13.7  11.5 - 15.5 % Final  . Platelets 01/29/2020 161  150 - 400 K/uL Final  . nRBC 01/29/2020 0.0  0.0 - 0.2 % Final   Performed at [facility], [address] [place], [place] [address]  Hospital Outpatient Visit on 01/23/2020  Component Date Value Ref Range Status  . SARS Coronavirus 2 01/23/2020 NEGATIVE  NEGATIVE Final   Comment: (NOTE) SARS-CoV-2 target nucleic acids are NOT DETECTED.  The SARS-CoV-2 RNA is generally detectable in upper and lower respiratory specimens during the acute phase of infection. Negative results do not preclude SARS-CoV-2 infection, do not rule out co-infections with other pathogens, and should not be used as the sole basis for treatment or other patient management decisions. Negative results must be combined with clinical observations, patient  history, and epidemiological information. The expected result is Negative.  Fact Sheet for Patients: SugarRoll.be  Fact Sheet for Healthcare  Providers: https://www.woods-mathews.com/  This test is not yet approved or cleared by the [place] FDA and  has been authorized for detection and/or diagnosis of SARS-CoV-2 by FDA under an Emergency Use Authorization (EUA). This EUA will remain  in effect (meaning this test can be used) for the duration of the COVID-19 declaration under Se                          ction 564(b)(1) of the Act, 21 U.S.C. section 360bbb-3(b)(1), unless the authorization is terminated or revoked sooner.  Performed at [facility], [place] [address], [place], [place] [address]   Hospital Outpatient Visit on 01/18/2020  Component Date Value Ref Range Status  . WBC 01/18/2020 3.9* 4.0 - 10.5 K/uL Final  . RBC 01/18/2020 4.26  3.87 - 5.11 MIL/uL Final  . Hemoglobin 01/18/2020 13.4  12.0 - 15.0 g/dL Final  . HCT 01/18/2020 40.6  36.0 - 46.0 % Final  . MCV 01/18/2020 95.3  80.0 - 100.0 fL Final  . MCH 01/18/2020 31.5  26.0 - 34.0 pg Final  . MCHC 01/18/2020 33.0  30.0 - 36.0 g/dL Final  . RDW 01/18/2020 13.2  11.5 - 15.5 % Final  . Platelets 01/18/2020 149* 150 - 400 K/uL Final  . nRBC 01/18/2020 0.0  0.0 - 0.2 % Final   Performed at [facility], [place] [address], [place], [place] [address]  . Sodium 01/18/2020 143  135 - 145 mmol/L Final  . Potassium 01/18/2020 4.4  3.5 - 5.1 mmol/L Final  . Chloride 01/18/2020 107  98 - 111 mmol/L Final  . CO2 01/18/2020 28  22 - 32 mmol/L Final  . Glucose, Bld 01/18/2020 95  70 - 99 mg/dL Final   Glucose reference range applies only to samples taken after fasting for at least 8 hours.  . BUN 01/18/2020 22  8 - 23 mg/dL Final  . Creatinine, Ser 01/18/2020 0.93  0.44 - 1.00 mg/dL Final  . Calcium 01/18/2020 9.2  8.9 - 10.3 mg/dL Final  . Total Protein 01/18/2020 6.8  6.5 - 8.1 g/dL Final  . Albumin 01/18/2020 4.0  3.5 - 5.0 g/dL Final  . AST 01/18/2020 23  15 - 41 U/L Final  . ALT 01/18/2020 18  0 - 44 U/L Final  . Alkaline  Phosphatase 01/18/2020 59  38 - 126 U/L Final  . Total Bilirubin 01/18/2020 0.6  0.3 - 1.2 mg/dL Final  . GFR, Estimated 01/18/2020 >60  >60 mL/min Final   Comment: (NOTE) Calculated using the CKD-EPI Creatinine Equation (2021)   . Anion gap 01/18/2020 8  5 - 15 Final   Performed at [facility], [place] [address] [place], [place] [address]  . Prothrombin Time 01/18/2020 12.8  11.4 - 15.2 seconds Final  . INR 01/18/2020 1.0  0.8 - 1.2 Final   Comment: (NOTE) INR goal varies based on device and disease states. Performed at [facility], [place] [address], [place], [place] [address]   . aPTT 01/18/2020 29  24 - 36 seconds Final   Performed at [facility], [place] [address] [place], [place] [address]  . ABO/RH(D) 01/18/2020 A POS   Final  . Antibody Screen 01/18/2020 NEG   Final  . Sample Expiration 01/18/2020 01/27/2020,2359   Final  .  Extend sample reason 01/18/2020    Final                   Value:NO TRANSFUSIONS OR PREGNANCY IN THE PAST 3 MONTHS Performed at [place] [address] [place], [place] [address]   . Hgb A1c MFr Bld 01/18/2020 5.1  4.8 - 5.6 % Final   Comment: (NOTE) Pre diabetes:          5.7%-6.4%  Diabetes:              >6.4%  Glycemic control for   <7.0% adults with diabetes   . Mean Plasma Glucose 01/18/2020 99.67  mg/dL Final   Performed at [place] [address], [place], [place] [address]  . MRSA, PCR 01/18/2020 NEGATIVE  NEGATIVE Final  . Staphylococcus aureus 01/18/2020 POSITIVE* NEGATIVE Final   Comment: (NOTE) The Xpert SA Assay (FDA approved for NASAL specimens in patients 72 years of age and older), is one component of a comprehensive surveillance program. It is not intended to diagnose infection nor to guide or monitor treatment. Performed at [facility], [place] [address], [place], [place] [address]   Hospital Outpatient Visit on 12/06/2019   Component Date Value Ref Range Status  . Creatinine, Ser 12/06/2019 1.00  0.44 - 1.00 mg/dL Final     X-Rays:DG CHEST PORT 1 VIEW  Result Date: 01/29/2020 CLINICAL DATA:  70 year old female with shortness of breath, pleural effusion noted on echocardiogram. EXAM: PORTABLE CHEST 1 VIEW COMPARISON:  Earlier the same day FINDINGS: The heart size and mediastinal contours are within normal limits. Both lungs are clear. The visualized skeletal structures are unremarkable. IMPRESSION: No acute cardiopulmonary process. No radiographic evidence of significant pleural effusion. Electronically Signed   By: Ruthann Cancer MD   On: 01/29/2020 15:45   DG CHEST PORT 1 VIEW  Result Date: 01/29/2020 CLINICAL DATA:  Shortness of breath EXAM: PORTABLE CHEST 1 VIEW COMPARISON:  10/25/2018 FINDINGS: Normal heart size and aortic contours. Hazy opacity at the medial right base correlates with fat pad on 2009 CT and is stable from prior chest x-ray. No acute infiltrate or edema. No effusion or pneumothorax. No acute osseous findings. Artifact from EKG leads. IMPRESSION: Negative portable chest. Electronically Signed   By: Monte Fantasia M.D.   On: 01/29/2020 04:58   ECHOCARDIOGRAM COMPLETE  Result Date: 01/29/2020    ECHOCARDIOGRAM REPORT   Patient Name:   Melissa Velazquez Date of Exam: 01/29/2020 Medical Rec #:  [id number]       Height:       64.0 in Accession #:    [id number]      Weight:       194.4 lb Date of Birth:  [birth year]        BSA:          1.933 m Patient Age:    89 years        BP:           127/84 mmHg Patient Gender: F               HR:           87 bpm. Exam Location:  Inpatient Procedure: 2D Echo, Color Doppler and Cardiac Doppler Indications:    Atrial fibrillation  History:        Patient has no prior history of Echocardiogram examinations.  Risk Factors:Hypertension.  Sonographer:    Clayton Lefort RDCS (AE) Referring Phys: [id number] [place]  1. Left ventricular ejection  fraction, by estimation, is 60 to 65%. The left ventricle has normal function. The left ventricle has no regional wall motion abnormalities. There is mild concentric left ventricular hypertrophy and moderate basal septal hypertrophy.  2. Right ventricular systolic function is mildly reduced. The right ventricular size is normal. There is moderately elevated pulmonary artery systolic pressure. The estimated right ventricular systolic pressure is 27.2 mmHg.  3. Large pleural effusion in the left lateral region.  4. The mitral valve is normal in structure. No evidence of mitral valve regurgitation. No evidence of mitral stenosis.  5. The aortic valve is tricuspid. Aortic valve regurgitation is not visualized. Mild aortic valve sclerosis is present, with no evidence of aortic valve stenosis. Aortic valve area, by VTI measures 2.41 cm. Aortic valve mean gradient measures 4.0 mmHg.  Aortic valve Vmax measures 1.51 m/s.  6. The inferior vena cava is dilated in size with <50% respiratory variability, suggesting right atrial pressure of 15 mmHg.  7. Tricuspid valve regurgitation is mild to moderate. FINDINGS  Left Ventricle: Left ventricular ejection fraction, by estimation, is 60 to 65%. The left ventricle has normal function. The left ventricle has no regional wall motion abnormalities. The left ventricular internal cavity size was normal in size. There is  mild concentric left ventricular hypertrophy. Left ventricular diastolic parameters are consistent with Grade I diastolic dysfunction (impaired relaxation). Normal left ventricular filling pressure. Right Ventricle: The right ventricular size is normal. No increase in right ventricular wall thickness. Right ventricular systolic function is mildly reduced. There is moderately elevated pulmonary artery systolic pressure. The tricuspid regurgitant velocity is 2.88 m/s, and with an assumed right atrial pressure of 15 mmHg, the estimated right ventricular systolic pressure  is 53.6 mmHg. Left Atrium: Left atrial size was normal in size. Right Atrium: Right atrial size was normal in size. Pericardium: There is no evidence of pericardial effusion. Mitral Valve: The mitral valve is normal in structure. Mild mitral annular calcification. No evidence of mitral valve regurgitation. No evidence of mitral valve stenosis. MV peak gradient, 4.0 mmHg. The mean mitral valve gradient is 2.0 mmHg. Tricuspid Valve: The tricuspid valve is normal in structure. Tricuspid valve regurgitation is mild to moderate. No evidence of tricuspid stenosis. Aortic Valve: The aortic valve is tricuspid. Aortic valve regurgitation is not visualized. Mild aortic valve sclerosis is present, with no evidence of aortic valve stenosis. Aortic valve mean gradient measures 4.0 mmHg. Aortic valve peak gradient measures 9.1 mmHg. Aortic valve area, by VTI measures 2.41 cm. Pulmonic Valve: The pulmonic valve was normal in structure. Pulmonic valve regurgitation is not visualized. No evidence of pulmonic stenosis. Aorta: The aortic root is normal in size and structure. Venous: The inferior vena cava is dilated in size with less than 50% respiratory variability, suggesting right atrial pressure of 15 mmHg. IAS/Shunts: No atrial level shunt detected by color flow Doppler. Additional Comments: There is a large pleural effusion in the left lateral region.  LEFT VENTRICLE PLAX 2D LVIDd:         3.60 cm  Diastology LVIDs:         2.40 cm  LV e' medial:    7.07 cm/s LV PW:         1.20 cm  LV E/e' medial:  11.2 LV IVS:        1.50 cm  LV e' lateral:  7.29 cm/s LVOT diam:     1.80 cm  LV E/e' lateral: 10.9 LV SV:         66 LV SV Index:   34 LVOT Area:     2.54 cm  RIGHT VENTRICLE             IVC RV Basal diam:  3.00 cm     IVC diam: 2.60 cm RV S prime:     11.50 cm/s TAPSE (M-mode): 1.6 cm LEFT ATRIUM             Index       RIGHT ATRIUM           Index LA diam:        3.10 cm 1.60 cm/m  RA Area:     12.00 cm LA Vol (A2C):   48.1  ml 24.88 ml/m RA Volume:   24.60 ml  12.73 ml/m LA Vol (A4C):   27.9 ml 14.43 ml/m LA Biplane Vol: 40.6 ml 21.00 ml/m  AORTIC VALVE AV Area (Vmax):    2.28 cm AV Area (Vmean):   2.15 cm AV Area (VTI):     2.41 cm AV Vmax:           151.00 cm/s AV Vmean:          92.500 cm/s AV VTI:            0.276 m AV Peak Grad:      9.1 mmHg AV Mean Grad:      4.0 mmHg LVOT Vmax:         135.00 cm/s LVOT Vmean:        78.300 cm/s LVOT VTI:          0.261 m LVOT/AV VTI ratio: 0.95  AORTA Ao Root diam: 3.10 cm Ao Asc diam:  3.30 cm MITRAL VALVE               TRICUSPID VALVE MV Area (PHT): 4.31 cm    TR Peak grad:   33.2 mmHg MV Peak grad:  4.0 mmHg    TR Vmax:        288.00 cm/s MV Mean grad:  2.0 mmHg MV Vmax:       1.00 m/s    SHUNTS MV Vmean:      58.3 cm/s   Systemic VTI:  0.26 m MV Decel Time: 176 msec    Systemic Diam: 1.80 cm MV E velocity: 79.30 cm/s MV A velocity: 99.00 cm/s MV E/A ratio:  0.80 Melissa Him MD Electronically signed by Melissa Him MD Signature Date/Time: 01/29/2020/1:29:49 PM    Final     EKG: Orders placed or performed during the hospital encounter of 01/24/20  . EKG 12-Lead  . EKG 12-Lead  . EKG 12-Lead  . EKG 12-Lead  . EKG 12-Lead  . EKG 12-Lead     Hospital Course: Melissa Velazquez is a 70 y.o. who was admitted to [facility]. They were brought to the operating room on 01/24/2020 and underwent Procedure(s): TOTAL KNEE BILATERAL.  Patient tolerated the procedure well and was later transferred to the recovery room and then to the orthopaedic floor for postoperative care. They were given PO and IV analgesics for pain control following their surgery. They were given 24 hours of postoperative antibiotics of  Anti-infectives (From admission, onward)   Start     Dose/Rate Route Frequency Ordered Stop   01/24/20 1830  ceFAZolin (ANCEF) IVPB 2g/100 mL premix  2 g 200 mL/hr over 30 Minutes Intravenous Every 6 hours 01/24/20 1724 01/25/20 0016   01/24/20 1030  ceFAZolin  (ANCEF) IVPB 2g/100 mL premix        2 g 200 mL/hr over 30 Minutes Intravenous On call to O.R. 01/24/20 1022 01/24/20 1201     and started on DVT prophylaxis in the form of Aspirin.   PT and OT were ordered for total joint protocol. Discharge planning consulted to help with postop disposition and equipment needs. Patient had a good night on the evening of surgery. Epidural was in place for postoperative pain management. They started to get up OOB with therapy on POD 1. Continued to work with therapy into POD #2. Epidural was removed on day two and foley catheter was discontinued. Bulky dressings were removed and the incisions were clean, dry, and intact with both Aquacels in place. She continued to work with physical therapy while a bed was arranged with CIR. DVT prophylaxis was switched to 10 mg Xarelto QD beginning Saturday following the epidural being pulled. Early in the AM of POD #5, Melissa Velazquez was found to have dizziness and afib with RVR while ambulating to the bathroom. Patient was transferred to the progressive unit with telemetry and the hospitalist service was consulted for management. Converted to normal sinus rhythm with IV metoprolol and was stable. Echo was performed as well. She will follow-up with cardiology outpatient for further management. On POD #6, a bed was available at [facility] and the patient was cleared by both orthopedics and the hospitalists service for discharge. She was transferred to CIR later that day in stable condition.   Diet: Regular diet Activity: WBAT Follow-up: in 2 weeks Disposition: CIR Discharged Condition: stable   Discharge Instructions    Call MD / Call 911   Complete by: As directed    If you experience chest pain or shortness of breath, CALL 911 and be transported to the hospital emergency room.  If you develope a fever above 101 F, pus (white drainage) or increased drainage or redness at the wound, or calf pain, call your surgeon's office.   Change  dressing   Complete by: As directed    You may remove the bulky bandage (ACE wrap and gauze) two days after surgery. You will have an adhesive waterproof bandage underneath. Leave this in place until your first follow-up appointment.   Constipation Prevention   Complete by: As directed    Drink plenty of fluids.  Prune juice may be helpful.  You may use a stool softener, such as Colace (over the counter) 100 mg twice a day.  Use MiraLax (over the counter) for constipation as needed.   Diet - low sodium heart healthy   Complete by: As directed    Do not put a pillow under the knee. Place it under the heel.   Complete by: As directed    Driving restrictions   Complete by: As directed    No driving for two weeks   TED hose   Complete by: As directed    Use stockings (TED hose) for three weeks on both leg(s).  You may remove them at night for sleeping.   Weight bearing as tolerated   Complete by: As directed      Allergies as of 01/30/2020      Reactions   Triple Antibiotic [bacitracin-neomycin-polymyxin] Anaphylaxis   Levofloxacin Other (See Comments)   Stomach upset   Capsaicin Rash   Elastic Bandages & [zinc] Rash  Nickel Itching, Rash      Medication List    STOP taking these medications   meloxicam 7.5 MG tablet Commonly known as: MOBIC     TAKE these medications   albuterol 108 (90 Base) MCG/ACT inhaler Commonly known as: VENTOLIN HFA Inhale 2 puffs into the lungs every 6 (six) hours as needed for wheezing or shortness of breath.   azelastine 0.1 % nasal spray Commonly known as: ASTELIN Place 1 spray into both nostrils 2 (two) times daily as needed for rhinitis.   cetirizine 10 MG tablet Commonly known as: ZYRTEC Take 10 mg by mouth daily as needed for allergies.   docusate sodium 100 MG capsule Commonly known as: COLACE Take 1 capsule (100 mg total) by mouth 2 (two) times daily.   fluticasone 50 MCG/ACT nasal spray Commonly known as: FLONASE Place 1 spray  into both nostrils 2 (two) times daily as needed for allergies or rhinitis.   gabapentin 300 MG capsule Commonly known as: NEURONTIN Take a 300 mg capsule three times a day for two weeks following surgery.Then take a 300 mg capsule two times a day for two weeks. Then take a 300 mg capsule once a day for two weeks. Then discontinue.   levocetirizine 5 MG tablet Commonly known as: XYZAL Take 1 tablet (5 mg total) by mouth every evening.   levothyroxine 75 MCG tablet Commonly known as: SYNTHROID Take 75 mcg by mouth daily before breakfast.   methocarbamol 500 MG tablet Commonly known as: ROBAXIN Take 1 tablet (500 mg total) by mouth every 6 (six) hours as needed for muscle spasms.   ondansetron 4 MG tablet Commonly known as: ZOFRAN Take 1 tablet (4 mg total) by mouth every 6 (six) hours as needed for nausea.   oxyCODONE 5 MG immediate release tablet Commonly known as: Oxy IR/ROXICODONE Take 1-3 tablets (5-15 mg total) by mouth every 6 (six) hours as needed for moderate pain or severe pain.   polyethylene glycol 17 g packet Commonly known as: MIRALAX / GLYCOLAX Take 17 g by mouth daily as needed for mild constipation.   rivaroxaban 10 MG Tabs tablet Commonly known as: XARELTO Take 1 tablet (10 mg total) by mouth daily. For three weeks, beginning 01/27/2020 and ending 02/17/2020. Then take one 81 mg aspirin once a day for three weeks. Then discontinue aspirin.   sertraline 100 MG tablet Commonly known as: ZOLOFT Take 150 mg by mouth daily with breakfast.   traMADol 50 MG tablet Commonly known as: ULTRAM Take 1-2 tablets (50-100 mg total) by mouth every 6 (six) hours as needed (can have every 4-6 hours prn).   triamcinolone 0.1 % Commonly known as: KENALOG Apply 1 application topically daily as needed (rash).            Discharge Care Instructions  (From admission, onward)         Start     Ordered   01/26/20 0000  Weight bearing as tolerated        01/26/20 0729    01/26/20 0000  Change dressing       Comments: You may remove the bulky bandage (ACE wrap and gauze) two days after surgery. You will have an adhesive waterproof bandage underneath. Leave this in place until your first follow-up appointment.   01/26/20 0729          Follow-up Information    Gaynelle Arabian, MD. Schedule an appointment as soon as possible for a visit on 02/06/2020.   Specialty: Orthopedic  Surgery Contact information: [address] [place] [address] [id number]               Signed: Theresa Duty, PA-C Orthopedic Surgery 01/30/2020, 2:02 PM

## 2020-01-30 NOTE — Progress Notes (Signed)
Inpatient Rehabilitation Medication Review by a Pharmacist  A complete drug regimen review was completed for this patient to identify any potential clinically significant medication issues.  Clinically significant medication issues were identified:  yes   Type of Medication Issue Identified Description of Issue Urgent (address now) Non-Urgent (address on AM team rounds) Plan Plan Accepted by Provider? (Yes / No / Pending AM Rounds)  Drug Interaction(s) (clinically significant)       Duplicate Therapy       Allergy       No Medication Administration End Date  Gabapentin tapering schedule specified By orthopedics in discharge summary.  Will add tapering schedule   Incorrect Dose       Additional Drug Therapy Needed       Other         Name of provider notified for urgent issues identified:  Provider Method of Notification:    For non-urgent medication issues to be resolved on team rounds tomorrow morning a [facility] [place] was sent to: [place]   Pharmacist comments: Gabapentin 300mg  TID x 2wks, BID x 2wks, then daily x 2wks, then discontinue  Time spent performing this drug regimen review (minutes):  5-96min  [name] Cardinal S. Alford Highland, PharmD, BCPS Clinical Staff Pharmacist [email or web address]  Azazel Franze S. Alford Highland, PharmD, BCPS Clinical Staff Pharmacist [email or web address] [place] 01/30/2020 6:06 PM

## 2020-01-30 NOTE — H&P (Shared)
Physical Medicine and Rehabilitation Admission H&P    CC: Functional deficits due to B-TKR complicated by cardiac issues.   HPI:  Melissa Velazquez is a 70 year old female with history of HTN, mild asthma, glucose intolerance, IBS- diarrhea, bilateral Knee OA with failure of conservative therapy and elected to undergo B-TKR on 01/24/19 by Dr. Wynelle Link.  Post surgery ABLA has been stable and thrombocytopenia has resolved.  On early a;m of 01/29/19,  she developed A fib with RVR and presyncope with BP 84/45 and HR 170's with hypoxia requiring su pplemental oxygen and she converted to NSR with IV BB per Dr. Marlowe Sax with [facility]. She received fluid bolus and oxycodone/robaxin were d/c.   2D echo done revealing EF 60-65% with mild concentric and moderate basal septal hypertrophy, moderate PAH as well as large pleural effusion in left lateral region. Follow up CXR was negative for significant pleural effusion. She continues to require supplemental oxygen with recommendations to wean as able as well as follow up with cardiology--seen by Dr. Harl Bowie for cardiac clearance pre-op. To continue Xarelto which was added for DVT prophylaxis --[id number]- 4. Therapy has been ongoing but continues to be limited by weakness and pain affecting ADLs and mobility. CIR was recommended due to functional decline.    Review of Systems  Constitutional: Negative for chills and fever.  Eyes: Negative for blurred vision and double vision.  Respiratory: Negative for cough and shortness of breath.   Cardiovascular: Positive for palpitations (occasionally). Negative for chest pain.  Gastrointestinal: Negative for abdominal pain, heartburn and nausea.  Genitourinary: Negative for dysuria and hematuria.  Musculoskeletal: Positive for joint pain and myalgias.  Neurological: Positive for weakness. Negative for dizziness and headaches.      Past Medical History:  Diagnosis Date  . Allergic rhinitis   . Anal fissure    Hx of    . Anxiety    hx of  . Arthritis   . Asthma    none in last year   . Chronic kidney disease    partial nephrectomy  . Depression    hx of  . Diabetes mellitus without complication (HCC)    no meds  . Diverticular disease   . Dysrhythmia    palpitations  . Eczema   . Endometrial cancer ([place]) 2001   spread to appendix   . Hemorrhoids   . Hyperglycemia   . Hypertension   . Hypothyroidism   . Kidney cancer, primary, with metastasis from kidney to other site [facility])   . Obesity   . Sebaceous cyst   . Thyroid disease   . Thyroid nodule   . 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 APPENDECTOMY  2008  . BIOPSY  12/14/2016   Procedure: BIOPSY;  Surgeon: Rogene Houston, MD;  [facility];  Service: Endoscopy;;  colon  . CHOLECYSTECTOMY  2008  . COLONOSCOPY N/A 11/24/2012   Procedure: COLONOSCOPY;  Surgeon: Rogene Houston, MD;  [facility];  Service: Endoscopy;  Laterality: N/A;  830  . COLONOSCOPY N/A 12/14/2016   Procedure: COLONOSCOPY;  Surgeon: Rogene Houston, MD;  [facility];  Service: Endoscopy;  Laterality: N/A;  12:00  . POLYPECTOMY  12/14/2016   Procedure: POLYPECTOMY;  Surgeon: Rogene Houston, MD;  Location: [facility];  Service: Endoscopy;;  colon  . ROBOTIC ASSITED PARTIAL NEPHRECTOMY Left 08/21/2016   Procedure: XI ROBOTIC ASSITED  PARTIAL NEPHRECTOMY;  Surgeon: Ardis Hughs, MD;  Location: [facility];  Service: Urology;  Laterality: Left;  . TOTAL ABDOMINAL HYSTERECTOMY  2001  . TOTAL KNEE ARTHROPLASTY Bilateral 01/24/2020   Procedure: TOTAL KNEE BILATERAL;  Surgeon: Gaynelle Arabian, MD;  Location: [facility];  Service: Orthopedics;  Laterality: Bilateral;  . WISDOM TOOTH EXTRACTION      Family History  Problem Relation Age of Onset  . Breast cancer Mother   . Diabetes Mother   . Obesity Sister   . Asthma Sister   . Eczema Sister   . COPD Father        smoked  .  Prostate cancer Father   . Breast cancer Maternal Aunt   . Urticaria Maternal Grandfather   . Colon cancer Neg Hx     Social History:  Lives alone and independent PTA. Works as Engineer, water.  reports that she quit smoking about 22 years ago. Her smoking use included cigarettes. She has a 20.00 pack-year smoking history. She has never used smokeless tobacco. She reports current alcohol use. She reports that she does not use drugs.   Allergies  Allergen Reactions  . Triple Antibiotic [Bacitracin-Neomycin-Polymyxin] Anaphylaxis  . Levofloxacin Other (See Comments)    Stomach upset  . Oxycodone     Tremors and brain fog  . Capsaicin Rash  . Elastic Bandages & [Zinc] Rash  . Nickel Itching and Rash    Medications Prior to Admission  Medication Sig Dispense Refill  . fluticasone (FLONASE) 50 MCG/ACT nasal spray Place 1 spray into both nostrils 2 (two) times daily as needed for allergies or rhinitis. 16 g 5  . levothyroxine (SYNTHROID, LEVOTHROID) 75 MCG tablet Take 75 mcg by mouth daily before breakfast.    . sertraline (ZOLOFT) 100 MG tablet Take 150 mg by mouth daily with breakfast.     . triamcinolone cream (KENALOG) 0.1 % Apply 1 application topically daily as needed (rash).    Marland Kitchen albuterol (PROVENTIL HFA;VENTOLIN HFA) 108 (90 Base) MCG/ACT inhaler Inhale 2 puffs into the lungs every 6 (six) hours as needed for wheezing or shortness of breath.    Marland Kitchen azelastine (ASTELIN) 0.1 % nasal spray Place 1 spray into both nostrils 2 (two) times daily as needed for rhinitis. (Patient not taking: Reported on 01/09/2020) 30 mL 5  . cetirizine (ZYRTEC) 10 MG tablet Take 10 mg by mouth daily as needed for allergies.    Marland Kitchen levocetirizine (XYZAL) 5 MG tablet Take 1 tablet (5 mg total) by mouth every evening. (Patient not taking: Reported on 01/09/2020) 30 tablet 5  . meloxicam (MOBIC) 7.5 MG tablet Take 7.5 mg by mouth daily.      Drug Regimen Review { DRUG REGIMEN [id number]  Home: Home  Living Family/patient expects to be discharged to:: Inpatient rehab Living Arrangements: Alone   Functional History: Prior Function Level of Independence: Independent  Functional Status:  Mobility: Bed Mobility Overal bed mobility: Needs Assistance Bed Mobility: Supine to Sit,Sit to Supine Supine to sit: Min assist Sit to supine: Min assist General bed mobility comments: Increased effort and pt able to raise LEs partially up with Min Assist to completely bring back to bed.  Pt able to scoot in sitting and in supine Mod I. Transfers Overall transfer level: Needs assistance Equipment used: Rolling walker (2 wheeled) Transfers: Sit to/from Stand Sit to Stand: Min assist Stand pivot transfers: Min assist General transfer comment: Pt stood from EOB with cues for hand placement and increased effort with Min As  after pt rocking forward to gain momentum to power up. Increased time to come to full standing position. Ambulation/Gait Ambulation/Gait assistance: Min guard Gait Distance (Feet): 24 Feet Assistive device: Rolling walker (2 wheeled) Gait Pattern/deviations: Step-to pattern,Decreased step length - right,Decreased step length - left,Shuffle,Trunk flexed General Gait Details: cues for posture, sequence, position from RW Gait velocity: decr    ADL: ADL Overall ADL's : Needs assistance/impaired Eating/Feeding: Independent Grooming: Standing,Supervision/safety,Wash/dry face,Oral care,Wash/dry hands Grooming Details (indicate cue type and reason): Pt able to stand at sink, and demonstrated positioning RW correctly anterior to sink without cues. Pt stood for 8 min and performed grooming/hygiene. No dizziness/lighheadedness and vitals stable. Upper Body Bathing: Set up,Sitting Lower Body Bathing: Maximal assistance,Set up,Sitting/lateral leans Upper Body Dressing : Set up,Sitting Lower Body Dressing: Total assistance,Sit to/from stand,+2 for safety/equipment,+2 for physical  assistance Toilet Transfer: +2 for safety/equipment,+2 for physical assistance,Minimal assistance,BSC,Stand-pivot,RW Toileting- Clothing Manipulation and Hygiene: Total assistance,Sit to/from stand Toileting - Clothing Manipulation Details (indicate cue type and reason): Pt denied need to void. Functional mobility during ADLs: Min guard,Minimal assistance,Rolling walker  Cognition: Cognition Overall Cognitive Status: Within Functional Limits for tasks assessed Orientation Level: Oriented X4 Cognition Arousal/Alertness: Awake/alert Behavior During Therapy: WFL for tasks assessed/performed Overall Cognitive Status: Within Functional Limits for tasks assessed General Comments: AxO x 3 very pleasant   Blood pressure (!) 144/96, pulse 72, temperature 98 F (36.7 C), resp. rate 18, height 5\' 4"  (1.626 m), weight 88.2 kg, SpO2 96 %. Physical Exam  Results for orders placed or performed during the hospital encounter of 01/24/20 (from the past 48 hour(s))  Glucose, capillary     Status: Abnormal   Collection Time: 01/29/20  2:03 AM  Result Value Ref Range   Glucose-Capillary 146 (H) 70 - 99 mg/dL    Comment: Glucose reference range applies only to samples taken after fasting for at least 8 hours.  Blood gas, arterial     Status: Abnormal   Collection Time: 01/29/20  4:10 AM  Result Value Ref Range   FIO2 21.00    pH, Arterial 7.489 (H) 7.350 - 7.450   pCO2 arterial 34.7 32.0 - 48.0 mmHg   pO2, Arterial 74.0 (L) 83.0 - 108.0 mmHg   Bicarbonate 26.1 20.0 - 28.0 mmol/L   Acid-Base Excess 3.4 (H) 0.0 - 2.0 mmol/L   O2 Saturation 95.6 %   Patient temperature 98.3    Collection site RIGHT RADIAL    Drawn by [id number]    Allens test (pass/fail) PASS PASS    Comment: Performed at [facility], [place] [address], [place], [place] [address]  TSH     Status: None   Collection Time: 01/29/20  5:31 AM  Result Value Ref Range   TSH 2.872 0.350 - 4.500 uIU/mL    Comment: Performed  by a 3rd Generation assay with a functional sensitivity of <=0.01 uIU/mL. Performed at [facility], [place] [address], [place], [place] [address]   CBC     Status: Abnormal   Collection Time: 01/29/20  5:31 AM  Result Value Ref Range   WBC 8.4 4.0 - 10.5 K/uL   RBC 3.19 (L) 3.87 - 5.11 MIL/uL   Hemoglobin 10.1 (L) 12.0 - 15.0 g/dL   HCT 31.2 (L) 36.0 - 46.0 %   MCV 97.8 80.0 - 100.0 fL   MCH 31.7 26.0 - 34.0 pg   MCHC 32.4 30.0 - 36.0 g/dL   RDW 13.7 11.5 - 15.5 %   Platelets 161  150 - 400 K/uL   nRBC 0.0 0.0 - 0.2 %    Comment: Performed at [facility], [place] [address], [place], [place] [address]  D-dimer, quantitative (not at [facility])     Status: Abnormal   Collection Time: 01/29/20  5:52 AM  Result Value Ref Range   D-Dimer, Quant 3.38 (H) 0.00 - 0.50 ug/mL-FEU    Comment: (NOTE) At the manufacturer cut-off value of 0.5 g/mL FEU, this assay has a negative predictive value of 95-100%.This assay is intended for use in conjunction with a clinical pretest probability (PTP) assessment model to exclude pulmonary embolism (PE) and deep venous thrombosis (DVT) in outpatients suspected of PE or DVT. Results should be correlated with clinical presentation. Performed at [facility], [place] [address] [place], [place] [address]    DG CHEST PORT 1 VIEW  Result Date: 01/29/2020 CLINICAL DATA:  70 year old female with shortness of breath, pleural effusion noted on echocardiogram. EXAM: PORTABLE CHEST 1 VIEW COMPARISON:  Earlier the same day FINDINGS: The heart size and mediastinal contours are within normal limits. Both lungs are clear. The visualized skeletal structures are unremarkable. IMPRESSION: No acute cardiopulmonary process. No radiographic evidence of significant pleural effusion. Electronically Signed   By: Ruthann Cancer MD   On: 01/29/2020 15:45   DG CHEST PORT 1 VIEW  Result Date: 01/29/2020 CLINICAL DATA:  Shortness of breath  EXAM: PORTABLE CHEST 1 VIEW COMPARISON:  10/25/2018 FINDINGS: Normal heart size and aortic contours. Hazy opacity at the medial right base correlates with fat pad on 2009 CT and is stable from prior chest x-ray. No acute infiltrate or edema. No effusion or pneumothorax. No acute osseous findings. Artifact from EKG leads. IMPRESSION: Negative portable chest. Electronically Signed   By: Monte Fantasia M.D.   On: 01/29/2020 04:58   ECHOCARDIOGRAM COMPLETE  Result Date: 01/29/2020    ECHOCARDIOGRAM REPORT   Patient Name:   Melissa Velazquez Date of Exam: 01/29/2020 Medical Rec #:  [id number]       Height:       64.0 in Accession #:    [id number]      Weight:       194.4 lb Date of Birth:  July 13, 1950        BSA:          1.933 m Patient Age:    43 years        BP:           127/84 mmHg Patient Gender: F               HR:           87 bpm. Exam Location:  Inpatient Procedure: 2D Echo, Color Doppler and Cardiac Doppler Indications:    Atrial fibrillation  History:        Patient has no prior history of Echocardiogram examinations.                 Risk Factors:Hypertension.  Sonographer:    Clayton Lefort RDCS (AE) Referring Phys: [id number] [place]  1. Left ventricular ejection fraction, by estimation, is 60 to 65%. The left ventricle has normal function. The left ventricle has no regional wall motion abnormalities. There is mild concentric left ventricular hypertrophy and moderate basal septal hypertrophy.  2. Right ventricular systolic function is mildly reduced. The right ventricular size is normal. There is moderately elevated pulmonary artery systolic pressure. The estimated right ventricular systolic pressure is 99991111 mmHg.  3.  Large pleural effusion in the left lateral region.  4. The mitral valve is normal in structure. No evidence of mitral valve regurgitation. No evidence of mitral stenosis.  5. The aortic valve is tricuspid. Aortic valve regurgitation is not visualized. Mild aortic valve sclerosis  is present, with no evidence of aortic valve stenosis. Aortic valve area, by VTI measures 2.41 cm. Aortic valve mean gradient measures 4.0 mmHg.  Aortic valve Vmax measures 1.51 m/s.  6. The inferior vena cava is dilated in size with <50% respiratory variability, suggesting right atrial pressure of 15 mmHg.  7. Tricuspid valve regurgitation is mild to moderate. FINDINGS  Left Ventricle: Left ventricular ejection fraction, by estimation, is 60 to 65%. The left ventricle has normal function. The left ventricle has no regional wall motion abnormalities. The left ventricular internal cavity size was normal in size. There is  mild concentric left ventricular hypertrophy. Left ventricular diastolic parameters are consistent with Grade I diastolic dysfunction (impaired relaxation). Normal left ventricular filling pressure. Right Ventricle: The right ventricular size is normal. No increase in right ventricular wall thickness. Right ventricular systolic function is mildly reduced. There is moderately elevated pulmonary artery systolic pressure. The tricuspid regurgitant velocity is 2.88 m/s, and with an assumed right atrial pressure of 15 mmHg, the estimated right ventricular systolic pressure is 99991111 mmHg. Left Atrium: Left atrial size was normal in size. Right Atrium: Right atrial size was normal in size. Pericardium: There is no evidence of pericardial effusion. Mitral Valve: The mitral valve is normal in structure. Mild mitral annular calcification. No evidence of mitral valve regurgitation. No evidence of mitral valve stenosis. MV peak gradient, 4.0 mmHg. The mean mitral valve gradient is 2.0 mmHg. Tricuspid Valve: The tricuspid valve is normal in structure. Tricuspid valve regurgitation is mild to moderate. No evidence of tricuspid stenosis. Aortic Valve: The aortic valve is tricuspid. Aortic valve regurgitation is not visualized. Mild aortic valve sclerosis is present, with no evidence of aortic valve stenosis.  Aortic valve mean gradient measures 4.0 mmHg. Aortic valve peak gradient measures 9.1 mmHg. Aortic valve area, by VTI measures 2.41 cm. Pulmonic Valve: The pulmonic valve was normal in structure. Pulmonic valve regurgitation is not visualized. No evidence of pulmonic stenosis. Aorta: The aortic root is normal in size and structure. Venous: The inferior vena cava is dilated in size with less than 50% respiratory variability, suggesting right atrial pressure of 15 mmHg. IAS/Shunts: No atrial level shunt detected by color flow Doppler. Additional Comments: There is a large pleural effusion in the left lateral region.  LEFT VENTRICLE PLAX 2D LVIDd:         3.60 cm  Diastology LVIDs:         2.40 cm  LV e' medial:    7.07 cm/s LV PW:         1.20 cm  LV E/e' medial:  11.2 LV IVS:        1.50 cm  LV e' lateral:   7.29 cm/s LVOT diam:     1.80 cm  LV E/e' lateral: 10.9 LV SV:         66 LV SV Index:   34 LVOT Area:     2.54 cm  RIGHT VENTRICLE             IVC RV Basal diam:  3.00 cm     IVC diam: 2.60 cm RV S prime:     11.50 cm/s TAPSE (M-mode): 1.6 cm LEFT ATRIUM  Index       RIGHT ATRIUM           Index LA diam:        3.10 cm 1.60 cm/m  RA Area:     12.00 cm LA Vol (A2C):   48.1 ml 24.88 ml/m RA Volume:   24.60 ml  12.73 ml/m LA Vol (A4C):   27.9 ml 14.43 ml/m LA Biplane Vol: 40.6 ml 21.00 ml/m  AORTIC VALVE AV Area (Vmax):    2.28 cm AV Area (Vmean):   2.15 cm AV Area (VTI):     2.41 cm AV Vmax:           151.00 cm/s AV Vmean:          92.500 cm/s AV VTI:            0.276 m AV Peak Grad:      9.1 mmHg AV Mean Grad:      4.0 mmHg LVOT Vmax:         135.00 cm/s LVOT Vmean:        78.300 cm/s LVOT VTI:          0.261 m LVOT/AV VTI ratio: 0.95  AORTA Ao Root diam: 3.10 cm Ao Asc diam:  3.30 cm MITRAL VALVE               TRICUSPID VALVE MV Area (PHT): 4.31 cm    TR Peak grad:   33.2 mmHg MV Peak grad:  4.0 mmHg    TR Vmax:        288.00 cm/s MV Mean grad:  2.0 mmHg MV Vmax:       1.00 m/s    SHUNTS  MV Vmean:      58.3 cm/s   Systemic VTI:  0.26 m MV Decel Time: 176 msec    Systemic Diam: 1.80 cm MV E velocity: 79.30 cm/s MV A velocity: 99.00 cm/s MV E/A ratio:  0.80 Fransico Him MD Electronically signed by Fransico Him MD Signature Date/Time: 01/29/2020/1:29:49 PM    Final        Medical Problem List and Plan: 1.  *** secondary to ***  -patient may *** shower  -ELOS/Goals: *** 2.  Antithrombotics: -DVT/anticoagulation:  Pharmaceutical: Xarelto--will order dopplers due to elevated D dimer/hypoxia  -antiplatelet therapy: N/a 3. Pain Management: Continue tramadol prn.  Ice prn for local measures.   --Will trial low dose vicodin and monitor for side effects 4. Mood: LCSW to follow for evaluation and support.   -antipsychotic agents: N/A 5. Neuropsych: This patient is capable of making decisions on her own behalf. 6. Skin/Wound Care: Monitor wounds for healing.  7. Fluids/Electrolytes/Nutrition: Monitor I/O. Check lytes in am. 8. PAF: Has history of intermittent palpitations (Dr. Harl Bowie) BP/HR tid. Encourage fluid intake.  9. ABLA: Will recheck CBC in am   --monitor for signs of bleeding.  10. Glucose intolerance: Recheck FBS in am.  11. H/o depression: On Zoloft dialy.  12. Hypoxia: Order flutter valve for pulmonary hygiene  --wean oxygen as able.  --Albuterol MDI prn SOB/wheezing. 13. IBS-diarrhea: Last BM 01/10 Continue colace for now.       ***  Bary Leriche, PA-C 01/30/2020

## 2020-01-30 NOTE — PMR Pre-admission (Signed)
PMR Admission Coordinator Pre-Admission Assessment  Patient: Melissa Velazquez is an 70 y.o., female MRN: [id number] DOB: 1950-11-18 Height: 5\' 4"  (162.6 cm) Weight: 88.2 kg  Insurance Information HMO:     PPO:      PCP:      IPA:      80/20:      OTHER:  PRIMARY: Medicare A and B      Policy#: [id number]      Subscriber: pt CM Name: n/a      Phone#:      Fax#:  Pre-Cert#: verified Civil engineer, contracting:  Benefits:  Phone #:      Name:  Eff. Date: 05/19/16 A and B     Deduct: $1556      Out of Pocket Max: n/a      Life Max: n/a CIR: 100%      SNF: 20 full days Outpatient: 80%     Co-Ins: 20% Home Health: 100%      Co-Pay:  DME: 80%     Co-Ins: 20% Providers: pt choice SECONDARY: [employer]      Policy#: [id number]     Phone#: [phone]  Financial Counselor:       Phone#:   The "Data Collection Information Summary" for patients in Inpatient Rehabilitation Facilities with attached "Privacy Act [place] Records" was provided and verbally reviewed with: Patient  Emergency Contact Information Contact Information    Name Relation Home Work Mobile   [name] Sister [phone]  [phone]      Current Medical History  Patient Admitting Diagnosis: bilateral TKA and post-op AF with RVR History of Present Illness: PT is a 70 y/o with PMH of HTN, mild asthma, glucose intolerance, back pain, bilateral Knee OA with failure of conservative therapy who underwent B-TKR on 01/24/19 per Dr. Wynelle Link.  Post surgery ABLA has been stable and thrombocytopenia has resolved.  On early am of 01/29/19, she developed A fib with RVR and presyncope with BP 84/45 and HR 170's and hypoxia requiring supplemental O2.  She converted to NSR with IV BB.  2D echo done revealing EF 60-65% with mild concentric and moderate basal septal hypertrophy, moderate PAH as well as large pleural effusion in left lateral region. Follow up CXR was negative for significant pleural effusion. She continues to require  supplemental oxygen with recommendations to wean as able as well as follow up with cardiology for evaluation as an outpatient. To continue Xarelto which was added for DVT prophylaxis --[id number]- 4.  Therapy evaluations were completed and pt was recommended for CIR.     Patient's medical record from [facility] has been reviewed by the rehabilitation admission coordinator and physician.  Past Medical History  Past Medical History:  Diagnosis Date  . Allergic rhinitis   . Anal fissure    Hx of   . Anxiety    hx of  . Arthritis   . Asthma    none in last year   . Chronic kidney disease    partial nephrectomy  . Depression    hx of  . Diabetes mellitus without complication (HCC)    no meds  . Diverticular disease   . Dysrhythmia    palpitations  . Eczema   . Endometrial cancer ([place]) 2001   spread to appendix   . Hemorrhoids   . Hyperglycemia   . Hypertension   . Hypothyroidism   . Kidney cancer, primary, with metastasis from kidney to other site [facility])   .  Obesity   . Sebaceous cyst   . Thyroid disease   . Thyroid nodule   . 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    Family History   family history includes Asthma in her sister; Breast cancer in her maternal aunt and mother; COPD in her father; Diabetes in her mother; Eczema in her sister; Obesity in her sister; Prostate cancer in her father; Urticaria in her maternal grandfather.  Prior Rehab/Hospitalizations Has the patient had prior rehab or hospitalizations prior to admission? No  Has the patient had major surgery during 100 days prior to admission? Yes   Current Medications  Current Facility-Administered Medications:  .  albuterol (VENTOLIN HFA) 108 (90 Base) MCG/ACT inhaler 2 puff, 2 puff, Inhalation, Q6H PRN, Edmisten, Kristie L, PA .  bisacodyl (DULCOLAX) suppository 10 mg, 10 mg, Rectal, Daily PRN, Edmisten, Kristie L, PA .  diphenhydrAMINE (BENADRYL) 12.5 MG/5ML elixir 12.5-25 mg, 12.5-25 mg,  Oral, Q4H PRN, Edmisten, Kristie L, PA .  docusate sodium (COLACE) capsule 100 mg, 100 mg, Oral, BID, Edmisten, Kristie L, PA, 100 mg at 01/30/20 0816 .  fluticasone (FLONASE) 50 MCG/ACT nasal spray 1 spray, 1 spray, Each Nare, BID PRN, Edmisten, Kristie L, PA, 1 spray at 01/26/20 2104 .  gabapentin (NEURONTIN) capsule 300 mg, 300 mg, Oral, TID, Edmisten, Kristie L, PA, 300 mg at 01/30/20 0819 .  levothyroxine (SYNTHROID) tablet 75 mcg, 75 mcg, Oral, QAC breakfast, Edmisten, Kristie L, PA, 75 mcg at 01/30/20 0525 .  loratadine (CLARITIN) tablet 10 mg, 10 mg, Oral, Daily, Edmisten, Kristie L, PA, 10 mg at 01/30/20 0819 .  menthol-cetylpyridinium (CEPACOL) lozenge 3 mg, 1 lozenge, Oral, PRN **OR** phenol (CHLORASEPTIC) mouth spray 1 spray, 1 spray, Mouth/Throat, PRN, Edmisten, Kristie L, PA .  metoCLOPramide (REGLAN) tablet 5-10 mg, 5-10 mg, Oral, Q8H PRN **OR** metoCLOPramide (REGLAN) injection 5-10 mg, 5-10 mg, Intravenous, Q8H PRN, Edmisten, Kristie L, PA .  ondansetron (ZOFRAN) tablet 4 mg, 4 mg, Oral, Q6H PRN **OR** ondansetron (ZOFRAN) injection 4 mg, 4 mg, Intravenous, Q6H PRN, Edmisten, Kristie L, PA .  polyethylene glycol (MIRALAX / GLYCOLAX) packet 17 g, 17 g, Oral, Daily PRN, Edmisten, Kristie L, PA .  rivaroxaban (XARELTO) tablet 10 mg, 10 mg, Oral, Daily, Childress, Sean D, PA-C, 10 mg at 01/30/20 0817 .  sertraline (ZOLOFT) tablet 150 mg, 150 mg, Oral, Q breakfast, Edmisten, Kristie L, PA, 150 mg at 01/30/20 0817 .  sodium phosphate (FLEET) 7-19 GM/118ML enema 1 enema, 1 enema, Rectal, Once PRN, Edmisten, Kristie L, PA .  traMADol (ULTRAM) tablet 50-100 mg, 50-100 mg, Oral, Q6H PRN, Justice Britain, MD, 100 mg at 01/30/20 0816  Patients Current Diet:  Diet Order            Diet - low sodium heart healthy           Diet regular Room service appropriate? Yes; Fluid consistency: Thin  Diet effective now                 Precautions / Restrictions Precautions Precautions:  Fall,Knee Restrictions Weight Bearing Restrictions: No Other Position/Activity Restrictions: WBAT   Has the patient had 2 or more falls or a fall with injury in the past year? No  Prior Activity Level    Prior Functional Level Self Care: Did the patient need help bathing, dressing, using the toilet or eating? Independent  Indoor Mobility: Did the patient need assistance with walking from room to room (with or without device)? Independent  Stairs: Did the  patient need assistance with internal or external stairs (with or without device)? Independent  Functional Cognition: Did the patient need help planning regular tasks such as shopping or remembering to take medications? Independent  Home Assistive Devices / Equipment Home Assistive Devices/Equipment: Bedside commode/3-in-1,Raised toilet seat with rails  Prior Device Use: Indicate devices/aids used by the patient prior to current illness, exacerbation or injury? None of the above  Current Functional Level Cognition  Overall Cognitive Status: Within Functional Limits for tasks assessed Orientation Level: Oriented X4 General Comments: AxO x 3 very pleasant    Extremity Assessment (includes Sensation/Coordination)  Upper Extremity Assessment: Overall WFL for tasks assessed  Lower Extremity Assessment: Defer to PT evaluation RLE Deficits / Details: trace quads and hip flexors; no DF; AAROM to 75 flex LLE Deficits / Details: 2-/5 quads with AAROM -5 - 75    ADLs  Overall ADL's : Needs assistance/impaired Eating/Feeding: Independent Grooming: Standing,Supervision/safety,Wash/dry face,Oral care,Wash/dry hands Grooming Details (indicate cue type and reason): Pt able to stand at sink, and demonstrated positioning RW correctly anterior to sink without cues. Pt stood for 8 min and performed grooming/hygiene. No dizziness/lighheadedness and vitals stable. Upper Body Bathing: Set up,Sitting Lower Body Bathing: Maximal assistance,Set  up,Sitting/lateral leans Upper Body Dressing : Set up,Sitting Lower Body Dressing: Total assistance,Sit to/from stand,+2 for safety/equipment,+2 for physical assistance Toilet Transfer: +2 for safety/equipment,+2 for physical assistance,Minimal assistance,BSC,Stand-pivot,RW Toileting- Clothing Manipulation and Hygiene: Total assistance,Sit to/from stand Toileting - Clothing Manipulation Details (indicate cue type and reason): Pt denied need to void. Functional mobility during ADLs: Min guard,Minimal assistance,Rolling walker    Mobility  Overal bed mobility: Needs Assistance Bed Mobility: Supine to Sit,Sit to Supine Supine to sit: Min assist Sit to supine: Min assist General bed mobility comments: Increased effort and pt able to raise LEs partially up with Min Assist to completely bring back to bed.  Pt able to scoot in sitting and in supine Mod I.    Transfers  Overall transfer level: Needs assistance Equipment used: Rolling walker (2 wheeled) Transfers: Sit to/from Stand Sit to Stand: Min assist Stand pivot transfers: Min assist General transfer comment: Pt stood from EOB with cues for hand placement and increased effort with Min As after pt rocking forward to gain momentum to power up. Increased time to come to full standing position.    Ambulation / Gait / Stairs / Wheelchair Mobility  Ambulation/Gait Ambulation/Gait assistance: Counsellor (Feet): 24 Feet Assistive device: Rolling walker (2 wheeled) Gait Pattern/deviations: Step-to pattern,Decreased step length - right,Decreased step length - left,Shuffle,Trunk flexed General Gait Details: cues for posture, sequence, position from RW Gait velocity: decr    Posture / Balance Dynamic Sitting Balance Sitting balance - Comments: Pt attempting to fall fwd from side of bed and requiring mod assist to correct and regain balance Balance Overall balance assessment: Needs assistance Sitting-balance support: No upper  extremity supported,Feet supported Sitting balance-Leahy Scale: Good Sitting balance - Comments: Pt attempting to fall fwd from side of bed and requiring mod assist to correct and regain balance Standing balance support: Bilateral upper extremity supported Standing balance-Leahy Scale: Poor Standing balance comment: Pt able to stand at sink and use BUEs for grooming/hygiene with compensation of forearms resting on vanity as needed.  Reliant on RW for BUE support during dynamic standing activities.    Special needs/care consideration N/a   Previous Home Environment (from acute therapy documentation) Living Arrangements: Alone Home Care Services: No  Discharge Living Setting Plans for Discharge Living Setting:  Patient's home,Alone Type of Home at Discharge: House Discharge Home Layout: One level Discharge Home Access: Stairs to enter Entrance Stairs-Rails: Surveyor, mining of Steps: 4 Discharge Bathroom Shower/Tub: Tub/shower unit Discharge Bathroom Toilet: Standard Discharge Bathroom Accessibility: Yes How Accessible: Accessible via walker Does the patient have any problems obtaining your medications?: No  Social/Family/Support Systems Anticipated Caregiver: mod I goals, sister Hoyle Sauer) is local and can help intermittently with IADLs Anticipated Caregiver's Contact Information: [phone] Caregiver Availability: Intermittent Discharge Plan Discussed with Primary Caregiver: Yes Is Caregiver In Agreement with Plan?: Yes  Goals Patient/Family Goal for Rehab: PT/OT mod I, SLP n/a Expected length of stay: 10-14 days Pt/Family Agrees to Admission and willing to participate: Yes Program Orientation Provided & Reviewed with Pt/Caregiver Including Roles  & Responsibilities: Yes  Barriers to Discharge: Decreased caregiver support  Decrease burden of Care through IP rehab admission: n/a  Possible need for SNF placement upon discharge: Not anticipated. Pt with mod I  goals.   Patient Condition: I have reviewed medical records from [facility], spoken with CM, and patient and family member. I discussed via phone for inpatient rehabilitation assessment.  Patient will benefit from ongoing PT and OT, can actively participate in 3 hours of therapy a day 5 days of the week, and can make measurable gains during the admission.  Patient will also benefit from the coordinated team approach during an Inpatient Acute Rehabilitation admission.  The patient will receive intensive therapy as well as Rehabilitation physician, nursing, social worker, and care management interventions.  Due to safety, skin/wound care, medication administration, pain management and patient education the patient requires 24 hour a day rehabilitation nursing.  The patient is currently min to mod assist with mobility and basic ADLs.  Discharge setting and therapy post discharge at home with home health is anticipated.  Patient has agreed to participate in the Acute Inpatient Rehabilitation Program and will admit today.  Preadmission Screen Completed By:  Michel Santee, PT, DPT 01/30/2020 11:23 AM ______________________________________________________________________   Discussed status with Dr. Dagoberto Ligas on 01/30/20  at 11:23 AM and received approval for admission today.  Admission Coordinator:  Michel Santee, PT, DPT time 11:23 AM Sudie Grumbling 01/30/20    Assessment/Plan: Diagnosis: 1. Does the need for close, 24 hr/day Medical supervision in concert with the patient's rehab needs make it unreasonable for this patient to be served in a less intensive setting? Yes 2. Co-Morbidities requiring supervision/potential complications: B/L TKA's, A fib with RVR, hypothyroidism, diet controlled DM 3. Due to bowel management, safety, skin/wound care, disease management, medication administration, pain management and patient education, does the patient require 24 hr/day rehab nursing? Yes 4. Does the patient  require coordinated care of a physician, rehab nurse, PT, OT, and SLP to address physical and functional deficits in the context of the above medical diagnosis(es)? Yes Addressing deficits in the following areas: balance, endurance, locomotion, strength, transferring, bathing, dressing, feeding, grooming and toileting 5. Can the patient actively participate in an intensive therapy program of at least 3 hrs of therapy 5 days a week? Yes 6. The potential for patient to make measurable gains while on inpatient rehab is good 7. Anticipated functional outcomes upon discharge from inpatient rehab: modified independent PT, modified independent OT, n/a SLP 8. Estimated rehab length of stay to reach the above functional goals is: 10-14 days 9. Anticipated discharge destination: Home 10. Overall Rehab/Functional Prognosis: good   MD Signature:

## 2020-01-30 NOTE — Progress Notes (Signed)
Physical Therapy Treatment Patient Details Name: Melissa Velazquez MRN: [id number] DOB: 1950/07/15 Today's Date: 01/30/2020    History of Present Illness Pt s/p Bil TKR and with hx DM, CKD, and Kidney CA s/p partial nephrectomy    PT Comments    POD # 6 am session Assisted OOB to amb to bathroom then in hallway.  Returned to bed and performed B TKR TE's followed by ICE.     Follow Up Recommendations  CIR     Equipment Recommendations       Recommendations for Other Services       Precautions / Restrictions Precautions Precautions: Fall;Knee Precaution Comments: instructed no pillows under knee Restrictions Weight Bearing Restrictions: No Other Position/Activity Restrictions: WBAT    Mobility  Bed Mobility Overal bed mobility: Needs Assistance Bed Mobility: Supine to Sit;Sit to Supine     Supine to sit: Min assist Sit to supine: Min assist   General bed mobility comments: assisted OOB and back to bed with support B LE and increased time  Transfers Overall transfer level: Needs assistance Equipment used: Rolling walker (2 wheeled) Transfers: Sit to/from Stand Sit to Stand: Min assist Stand pivot transfers: Min assist       General transfer comment: 25% VC's on proper hand placement and safety with turns.  Also assisted with toilet transfer.  Ambulation/Gait Ambulation/Gait assistance: Min guard Gait Distance (Feet): 28 Feet Assistive device: Rolling walker (2 wheeled) Gait Pattern/deviations: Step-to pattern;Decreased step length - right;Decreased step length - left;Shuffle;Trunk flexed Gait velocity: decr   General Gait Details: cues for posture, sequence, position from [employer]             Wheelchair Mobility    Modified Rankin (Stroke Patients Only)       Balance                                            Cognition Arousal/Alertness: Awake/alert Behavior During Therapy: WFL for tasks assessed/performed Overall  Cognitive Status: Within Functional Limits for tasks assessed                                 General Comments: AxO x 3 very pleasant      Exercises   Bilateral Total Knee Replacement TE's following HEP handout 10 reps B LE ankle pumps 05 reps towel squeezes 05 reps knee presses 05 reps heel slides  05 reps SAQ's 05 reps SLR's 05 reps ABD Educated on use of gait belt to assist with TE's Followed by ICE    General Comments        Pertinent Vitals/Pain Pain Assessment: 0-10 Pain Score: 5  Pain Location: B knees L>R Pain Descriptors / Indicators: Aching;Sore;Tender;Tightness Pain Intervention(s): Monitored during session;Premedicated before session;Repositioned;Ice applied    Home Living                      Prior Function            PT Goals (current goals can now be found in the care plan section) Progress towards PT goals: Progressing toward goals    Frequency    7X/week      PT Plan Current plan remains appropriate    Co-evaluation              AM-PAC  PT "6 Clicks" Mobility   Outcome Measure  Help needed turning from your back to your side while in a flat bed without using bedrails?: A Little Help needed moving from lying on your back to sitting on the side of a flat bed without using bedrails?: A Little Help needed moving to and from a bed to a chair (including a wheelchair)?: A Little Help needed standing up from a chair using your arms (e.g., wheelchair or bedside chair)?: A Lot Help needed to walk in hospital room?: A Lot Help needed climbing 3-5 steps with a railing? : Total 6 Click Score: 14    End of Session Equipment Utilized During Treatment: Gait belt Activity Tolerance: Patient tolerated treatment well Patient left: in bed;with call bell/phone within reach Nurse Communication: Mobility status PT Visit Diagnosis: Muscle weakness (generalized) (M62.81);Difficulty in walking, not elsewhere classified (R26.2)      Time: 1610-9604 PT Time Calculation (min) (ACUTE ONLY): 32 min  Charges:  $Gait Training: 8-22 mins $Therapeutic Exercise: 8-22 mins                     Rica Koyanagi  PTA Acute  Rehabilitation Services Pager      [phone] Office      [phone]

## 2020-01-31 ENCOUNTER — Inpatient Hospital Stay (HOSPITAL_COMMUNITY): Payer: Medicare Other

## 2020-01-31 ENCOUNTER — Inpatient Hospital Stay (HOSPITAL_COMMUNITY): Payer: Medicare Other | Admitting: Physical Therapy

## 2020-01-31 ENCOUNTER — Encounter (HOSPITAL_COMMUNITY): Payer: Medicare Other

## 2020-01-31 LAB — COMPREHENSIVE METABOLIC PANEL
ALT: 116 U/L — ABNORMAL HIGH (ref 0–44)
AST: 54 U/L — ABNORMAL HIGH (ref 15–41)
Albumin: 2.7 g/dL — ABNORMAL LOW (ref 3.5–5.0)
Alkaline Phosphatase: 96 U/L (ref 38–126)
Anion gap: 11 (ref 5–15)
BUN: 13 mg/dL (ref 8–23)
CO2: 27 mmol/L (ref 22–32)
Calcium: 8.3 mg/dL — ABNORMAL LOW (ref 8.9–10.3)
Chloride: 98 mmol/L (ref 98–111)
Creatinine, Ser: 0.97 mg/dL (ref 0.44–1.00)
GFR, Estimated: >60 mL/min (ref >60)
Glucose, Bld: 141 mg/dL — ABNORMAL HIGH (ref 70–99)
Potassium: 3.7 mmol/L (ref 3.5–5.1)
Sodium: 136 mmol/L (ref 135–145)
Total Bilirubin: 1 mg/dL (ref 0.3–1.2)
Total Protein: 5.9 g/dL — ABNORMAL LOW (ref 6.5–8.1)

## 2020-01-31 LAB — CBC WITH DIFFERENTIAL/PLATELET
Abs Immature Granulocytes: 0.1 10*3/uL — ABNORMAL HIGH (ref 0.00–0.07)
Basophils Absolute: 0 10*3/uL (ref 0.0–0.1)
Basophils Relative: 0 %
Eosinophils Absolute: 0.5 10*3/uL (ref 0.0–0.5)
Eosinophils Relative: 6 %
HCT: 31.7 % — ABNORMAL LOW (ref 36.0–46.0)
Hemoglobin: 10.7 g/dL — ABNORMAL LOW (ref 12.0–15.0)
Immature Granulocytes: 1 %
Lymphocytes Relative: 15 %
Lymphs Abs: 1.2 10*3/uL (ref 0.7–4.0)
MCH: 32 pg (ref 26.0–34.0)
MCHC: 33.8 g/dL (ref 30.0–36.0)
MCV: 94.9 fL (ref 80.0–100.0)
Monocytes Absolute: 0.8 10*3/uL (ref 0.1–1.0)
Monocytes Relative: 10 %
Neutro Abs: 5.1 10*3/uL (ref 1.7–7.7)
Neutrophils Relative %: 68 %
Platelets: 259 10*3/uL (ref 150–400)
RBC: 3.34 MIL/uL — ABNORMAL LOW (ref 3.87–5.11)
RDW: 13.7 % (ref 11.5–15.5)
WBC: 7.7 10*3/uL (ref 4.0–10.5)
nRBC: 0 % (ref 0.0–0.2)

## 2020-01-31 MED ORDER — BENZOCAINE 10 % MT GEL
OROMUCOSAL | Status: DC | PRN
  Filled 2020-01-31: qty 9

## 2020-01-31 NOTE — Progress Notes (Signed)
Melissa Velazquez   Subjective/Complaints:  Pt reports  Going to get shower this AM- has a "sore" under upper lip- very sore/tender-  Bowels OK- had LBM just a few minutes prior.   Asking to have IV removed.    ROS: Pt denies SOB, abd pain, CP, N/V/C/D, and vision changes   Objective:   No results found. Recent Labs    01/29/20 0531 01/31/20 0752  WBC 8.4 7.7  HGB 10.1* 10.7*  HCT 31.2* 31.7*  PLT 161 259   Recent Labs    01/31/20 0752  NA 136  K 3.7  CL 98  CO2 27  GLUCOSE 141*  BUN 13  CREATININE 0.97  CALCIUM 8.3*    Intake/Output Summary (Last 24 hours) at 01/31/2020 1622 Last data filed at 01/31/2020 1300 Gross per 24 hour  Intake 800 ml  Output -  Net 800 ml        Physical Exam: Vital Signs Blood pressure 126/62, pulse 80, temperature 98.2 F (36.8 C), resp. rate 16, height 5\' 4"  (1.626 m), weight 93.7 kg, SpO2 99 %.  Constitutional:  awake, alert, sitting up in bed, OT in room, NAD, RN in room as well HENT:  \conjugate gaze, tiny cloverleaf shaped sore inside of upper lip- appears pus filled and closed Cardiovascular: RRR No gallop.   Pulmonary: CTA B/L- no W/R/R- good air movement Abdominal: Soft, NT, ND, (+)BS -hypoactive  Musculoskeletal:     Cervical back: Normal range of motion. No rigidity.     Comments: UEs 5/5 in biceps, triceps, WE grip and finger abd LEs- 2/5 in HF B/L; KE/KF NT due to knee surgery, and DF and PF 5/5 B/L Has knees with original surgical dressings on them B/L- no drainage seen- moderate swelling - knees look same- original surgical dressings in place Skin: IV LUE- will remove Neurological:  Intact to light touch in all 4 extremities Ox3 Psychiatric: appropriate    Assessment/Plan: 1. Functional deficits which require 3+ hours per day of interdisciplinary therapy in a comprehensive inpatient rehab setting.  Physiatrist is providing close team supervision and 24 hour  management of active medical problems listed below.  Physiatrist and rehab team continue to assess barriers to discharge/monitor patient progress toward functional and medical goals  Care Tool:  Bathing    Body parts bathed by patient: Right arm,Left arm,Chest,Abdomen,Front perineal area,Right upper leg,Left upper leg,Face   Body parts bathed by helper: Buttocks,Right lower leg,Left lower leg     Bathing assist Assist Level: Moderate Assistance - Patient 50 - 74%     Upper Body Dressing/Undressing Upper body dressing   What is the patient wearing?: Pull over shirt    Upper body assist Assist Level: Set up assist    Lower Body Dressing/Undressing Lower body dressing      What is the patient wearing?: Underwear/pull up,Pants     Lower body assist Assist for lower body dressing: Moderate Assistance - Patient 50 - 74%     Toileting Toileting    Toileting assist Assist for toileting: Minimal Assistance - Patient > 75%     Transfers Chair/bed transfer  Transfers assist     Chair/bed transfer assist level: Minimal Assistance - Patient > 75%     Locomotion Ambulation   Ambulation assist      Assist level: Contact Guard/Touching assist Assistive device: Walker-rolling Max distance: 80 ft   Walk 10 feet activity   Assist     Assist level: Contact  Guard/Touching assist Assistive device: Walker-rolling   Walk 50 feet activity   Assist    Assist level: Contact Guard/Touching assist Assistive device: Walker-rolling    Walk 150 feet activity   Assist Walk 150 feet activity did not occur: Safety/medical concerns (limited by pain/activity tolerance)    Assistive device: Walker-rolling    Walk 10 feet on uneven surface  activity   Assist Walk 10 feet on uneven surfaces activity did not occur: Safety/medical concerns (limited by pain/activity tolerance)         Wheelchair     Assist Will patient use wheelchair at discharge?: No  (patient progressing to a functional ambulator) Type of Wheelchair: Manual    Wheelchair assist level: Supervision/Verbal cueing      Wheelchair 50 feet with 2 turns activity    Assist        Assist Level: Supervision/Verbal cueing   Wheelchair 150 feet activity     Assist      Assist Level: Supervision/Verbal cueing   Blood pressure 126/62, pulse 80, temperature 98.2 F (36.8 C), resp. rate 16, height 5\' 4"  (1.626 m), weight 93.7 kg, SpO2 99 %.   Medical Problem List and Plan: 1.  Impaired function secondary to B/L Knee arthroplasties due to end stage arthritis B/L of knees             -patient may  Shower if knees covered  1/12- will get shower this AM             -ELOS/Goals: 10-14 days mod I 2.  Antithrombotics: -DVT/anticoagulation:  Pharmaceutical: Xarelto--will order dopplers due to elevated D dimer/hypoxia             -antiplatelet therapy: N/a 3. Pain Management: Continue tramadol prn.  Ice prn for local measures. Also gave her 50-100 mg q6 hours prn for tramadol             --Will trial low dose vicodin and monitor for side effects  1/12- tried 1/2 Norco "just now"- no relief so far, but was also taking tramadol prn- con't regimen 4. Mood: LCSW to follow for evaluation and support.              -antipsychotic agents: N/A 5. Neuropsych: This patient is capable of making decisions on her own behalf. 6. Skin/Wound Care: Monitor wounds for healing.  1/12- will remove IV- not needed; will remove surgical dressings tomorrow as long as didn't get wet/removed today.   7. Fluids/Electrolytes/Nutrition: Monitor I/O. Check lytes in am. 8. PAF: Has history of intermittent palpitations (Dr. Harl Bowie) BP/HR tid. Encourage fluid intake.  9. ABLA: Will recheck CBC in am                         --monitor for signs of bleeding.  10. Glucose intolerance: Recheck FBS in am.  1/12- BG this AM prior to food was 141- will double check to see if have HbA1c- last 1 01/18/20- is  5.1- so will just monitor 11. H/o depression: On Zoloft dialy.  12. Hypoxia: Order flutter valve for pulmonary hygiene             --wean oxygen as able.             --Albuterol MDI prn SOB/wheezing. 13. IBS-diarrhea: Last BM 01/10 Continue colace for now. 14. Mouth ulcer/sore/lesion  1/12- will start orojel prn for pt.     LOS: 1 days A FACE TO [place]  Melissa Velazquez 01/31/2020, 4:22 PM

## 2020-01-31 NOTE — Evaluation (Signed)
Physical Therapy Assessment and Plan  Patient Details  Name: Melissa Velazquez MRN: [id number] Date of Birth: [birth year]  PT Diagnosis: Abnormality of gait, Difficulty walking, Edema, Muscle weakness and Pain in joint Rehab Potential: Excellent ELOS: 7-10 days   Today's Date: 01/31/2020 PT Individual Time:  1005-1105 PT Individual Time Calculation (min): 60 min    Hospital Problem: Principal Problem:   OA (osteoarthritis) of knee   Past Medical History:  Past Medical History:  Diagnosis Date   Allergic rhinitis    Anal fissure    Hx of    Anxiety    hx of   Arthritis    Asthma    none in last year    Chronic kidney disease    partial nephrectomy   Depression    hx of   Diabetes mellitus without complication ([place])    no meds   Diverticular disease    Dysrhythmia    palpitations   Eczema    Endometrial cancer ([place]) 2001   spread to appendix    Hemorrhoids    Hyperglycemia    Hypertension    Hypothyroidism    Kidney cancer, primary, with metastasis from kidney to other site [facility])    Obesity    Sebaceous cyst    Thyroid disease    Thyroid nodule    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   Past Surgical History:  Past Surgical History:  Procedure Laterality Date   APPENDECTOMY  2008   BIOPSY  12/14/2016   Procedure: BIOPSY;  Surgeon: Rogene Houston, MD;  [facility];  Service: Endoscopy;;  colon   CHOLECYSTECTOMY  2008   COLONOSCOPY N/A 11/24/2012   Procedure: COLONOSCOPY;  Surgeon: Rogene Houston, MD;  [facility]: [facility];  Service: Endoscopy;  Laterality: N/A;  830   COLONOSCOPY N/A 12/14/2016   Procedure: COLONOSCOPY;  Surgeon: Rogene Houston, MD;  [facility];  Service: Endoscopy;  Laterality: N/A;  12:00   POLYPECTOMY  12/14/2016   Procedure: POLYPECTOMY;  Surgeon: Rogene Houston, MD;  [facility]: [facility];  Service: Endoscopy;;  colon   ROBOTIC ASSITED PARTIAL NEPHRECTOMY Left 08/21/2016    Procedure: XI ROBOTIC ASSITED PARTIAL NEPHRECTOMY;  Surgeon: Ardis Hughs, MD;  Location: [facility];  Service: Urology;  Laterality: Left;   TOTAL ABDOMINAL HYSTERECTOMY  2001   TOTAL KNEE ARTHROPLASTY Bilateral 01/24/2020   Procedure: TOTAL KNEE BILATERAL;  Surgeon: Gaynelle Arabian, MD;  Location: [facility];  Service: Orthopedics;  Laterality: Bilateral;   WISDOM TOOTH EXTRACTION      Assessment & Plan Clinical Impression: Patient is a 70 y.o. year old female with history of HTN, mild asthma, glucose intolerance, IBS- diarrhea, bilateral Knee OA with failure of conservative therapy and elected to undergo B-TKR on 69/05/21 by Dr. Wynelle Link.  Post surgery ABLA has been stable and thrombocytopenia has resolved.  On early a;m of 01/29/19,  she developed A fib with RVR and presyncope with BP 84/45 and HR 170's with hypoxia requiring su pplemental oxygen and she converted to NSR with IV BB per Dr. Marlowe Sax with [facility]. She received fluid bolus and oxycodone/robaxin were d/c.   2D echo done revealing EF 60-65% with mild concentric and moderate basal septal hypertrophy, moderate PAH as well as large pleural effusion in left lateral region. Follow up CXR was negative for significant pleural effusion. She continues to require supplemental oxygen with recommendations to wean as able as well as follow up with cardiology--seen by Dr.  Branch for cardiac clearance pre-op. To continue Xarelto which was added for DVT prophylaxis --[id number]- 4. Therapy has been ongoing but continues to be limited by weakness and pain affecting ADLs and mobility. CIR was recommended due to functional decline. Patient transferred to CIR on 01/30/2020 .   Patient currently requires min with mobility secondary to muscle weakness and muscle joint tightness, decreased cardiorespiratoy endurance and decreased sitting balance, decreased standing balance, decreased postural control and decreased balance strategies.  Prior to hospitalization,  patient was independent  with mobility and lived with Alone in a House home.  Home access is 4Stairs to enter.  Patient will benefit from skilled PT intervention to maximize safe functional mobility, minimize fall risk and decrease caregiver burden for planned discharge home alone.  Anticipate patient will benefit from follow up OP at discharge.  PT - End of Session Activity Tolerance: Tolerates 30+ min activity with multiple rests Endurance Deficit: Yes PT Assessment Rehab Potential (ACUTE/IP ONLY): Excellent PT Barriers to Discharge: Home environment access/layout;Lack of/limited family support;Decreased caregiver support PT Barriers to Discharge Comments: has 4 STE with wide rails, plans to d/c home alone with limited PRN assist PT Patient demonstrates impairments in the following area(s): Balance;Pain;Skin Integrity;Nutrition;Edema;Endurance PT Transfers Functional Problem(s): Bed Mobility;Bed to Chair;Car;Furniture PT Locomotion Functional Problem(s): Ambulation;Wheelchair Mobility;Stairs PT Plan PT Intensity: Minimum of 1-2 x/day ,45 to 90 minutes PT Frequency: 5 out of 7 days PT Duration Estimated Length of Stay: 7-10 days PT Treatment/Interventions: Ambulation/gait training;Discharge planning;DME/adaptive equipment instruction;Functional mobility training;Pain management;Psychosocial support;Splinting/orthotics;Therapeutic Activities;UE/LE Strength taining/ROM;Balance/vestibular training;Community reintegration;Disease management/prevention;Functional electrical stimulation;Neuromuscular re-education;Patient/family education;Skin care/wound management;Stair training;Therapeutic Exercise;UE/LE Coordination activities;Wheelchair propulsion/positioning PT Transfers Anticipated Outcome(s): mod I using LRAD PT Locomotion Anticipated Outcome(s): mod I household distances, supervision community distances using LRAD PT Recommendation Follow Up Recommendations: Outpatient PT Patient  destination: Home Equipment Recommended: Rolling walker with 5" wheels;To be determined   PT Evaluation Precautions/Restrictions Precautions Precautions: Fall;Knee Precaution Comments: instructed no pillows under knee Restrictions Weight Bearing Restrictions: Yes RLE Weight Bearing: Weight bearing as tolerated LLE Weight Bearing: Weight bearing as tolerated Home Living/Prior Functioning Home Living Available Help at Discharge: Family (pt's sister available to check on her intermittently in person and by phone) Type of Home: House Home Access: Stairs to enter [employer] of Steps: 4 Entrance Stairs-Rails: Right;Left Home Layout: One level Bathroom Shower/Tub: Chiropodist: Standard (has a riser and arm rests) Additional Comments: sistter planning on buying TTB  Lives With: Alone Prior Function Level of Independence: Independent with homemaking with ambulation;Independent with basic ADLs  Able to Take Stairs?: Yes Driving: Yes 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 Vision/Perception  Perception Perception: Within Functional Limits Praxis Praxis: Intact  Cognition Overall Cognitive Status: Within Functional Limits for tasks assessed Arousal/Alertness: Awake/alert Orientation Level: Oriented X4 Memory: Appears intact Awareness: Appears intact Problem Solving: Appears intact Safety/Judgment: Appears intact Sensation Sensation Light Touch: Appears Intact Proprioception: Appears Intact Coordination Gross Motor Movements are Fluid and Coordinated: No Fine Motor Movements are Fluid and Coordinated: Yes Coordination and Movement Description: Decreased strength/ROM of B LEs due to B TKRs Heel Shin Test: unable due to decreased knee ROM Motor  Motor Motor: Other (comment) Motor - Skilled Clinical Observations: Lower extremity weakness limted by pain in surgical sites   Trunk/Postural Assessment  Cervical  Assessment Cervical Assessment: Within Functional Limits Thoracic Assessment Thoracic Assessment: Within Functional Limits Lumbar Assessment Lumbar Assessment: Within Functional Limits Postural Control Postural Control: Deficits on evaluation (delayed d/t pain)  Balance Balance Balance Assessed: Yes  Dynamic Sitting Balance Dynamic Sitting - Balance Support: During functional activity Dynamic Sitting - Level of Assistance: 5: Stand by assistance Dynamic Standing Balance Dynamic Standing - Balance Support: During functional activity Dynamic Standing - Level of Assistance: 4: Min assist Extremity Assessment  RUE Assessment RUE Assessment: Within Functional Limits LUE Assessment LUE Assessment: Within Functional Limits RLE Assessment RLE Assessment: Exceptions to [facility] General Strength Comments: Can perform 10 SLR without assist and <5 deg extensor lag in supine, DF/PF 5/5, limited testing due to pain LLE Assessment General Strength Comments: Can perform 10 SLR without assist and <5 deg extensor lag in supine, DF/PF 5/5, limited testing due to pain  Care Tool Care Tool Bed Mobility Roll left and right activity   Roll left and right assist level: Minimal Assistance - Patient > 75%    Sit to lying activity   Sit to lying assist level: Minimal Assistance - Patient > 75%    Lying to sitting edge of bed activity   Lying to sitting edge of bed assist level: Supervision/Verbal cueing     Care Tool Transfers Sit to stand transfer   Sit to stand assist level: Minimal Assistance - Patient > 75%    Chair/bed transfer   Chair/bed transfer assist level: Minimal Assistance - Patient > 75%     Toilet transfer   Assist Level: Moderate Assistance - Patient 50 - 74%    Car transfer   Car transfer assist level: Minimal Assistance - Patient > 75%      Care Tool Locomotion Ambulation   Assist level: Contact Guard/Touching assist Assistive device: Walker-rolling Max distance: 80 ft   Walk 10 feet activity   Assist level: Contact Guard/Touching assist Assistive device: Walker-rolling   Walk 50 feet with 2 turns activity   Assist level: Contact Guard/Touching assist Assistive device: Walker-rolling  Walk 150 feet activity Walk 150 feet activity did not occur: Safety/medical concerns (limited by pain/activity tolerance)   Assistive device: Walker-rolling  Walk 10 feet on uneven surfaces activity Walk 10 feet on uneven surfaces activity did not occur: Safety/medical concerns (limited by pain/activity tolerance)      Stairs Stair activity did not occur: Safety/medical concerns (limited by pain/activity tolerance)        Walk up/down 1 step activity Walk up/down 1 step or curb (drop down) activity did not occur: Safety/medical concerns     Walk up/down 4 steps activity did not occuR: Safety/medical concerns  Walk up/down 4 steps activity      Walk up/down 12 steps activity Walk up/down 12 steps activity did not occur: Safety/medical concerns      Pick up small objects from floor Pick up small object from the floor (from standing position) activity did not occur: Safety/medical concerns      Wheelchair Will patient use wheelchair at discharge?: No (patient progressing to a functional ambulator) Type of Wheelchair: Manual   Wheelchair assist level: Supervision/Verbal cueing    Wheel 50 feet with 2 turns activity   Assist Level: Supervision/Verbal cueing  Wheel 150 feet activity   Assist Level: Supervision/Verbal cueing    Refer to Care Plan for Long Term Goals  SHORT TERM GOAL WEEK 1 PT Short Term Goal 1 (Week 1): STG=LTG due to short ELOS.  Recommendations for other services: None   Skilled Therapeutic Intervention In addition to the PT evaluation above, the patient performed the following skilled PT interventions:  Patient in bed upon PT arrival. Patient alert and agreeable to PT session. Patient reported 5/10  at rest and 7/10 with activity pain  during session, RN made aware and provided Tylenol during session. PT provided repositioning, rest breaks, and distraction as pain interventions throughout session.   Vitals: BP: 125/71, HR: 70  Patient demonstrated 10 SLR without assist with <5 deg extensor lag bilaterally in supine, demonstrating sufficient quad strength to prevent buckling with standing/gait. Did not use B knee immobilizers with mobility, per protocol.   Donned TED hose and tennis shoes with total A prior to mobility.   Patient performed sit to stand x3 and stand pivot transfers x2 progressing to [place] using RW. Provided cues for pushing up to stand and reaching back to sit for safety and improved leverage when standing, forward weight shift, and increased knee flexion with functional mobility (patient slides feet out as she sits and walks feet back as she stands), noted some improvement, but limited tolerance with knee flexion due to pain. Educated on importance of knee ROM for functional mobility and focus on progressing this throughout stay. Patient stated understanding.   Instructed pt in results of PT evaluation as detailed above, PT POC, rehab potential, rehab goals, and discharge recommendations. Additionally discussed CIR's policies regarding fall safety and use of chair alarm and/or quick release belt. Pt verbalized understanding and in agreement. Will update pt's family members as they become available.   Patient in w/c with legs elevated in ELRs at end of session with breaks locked, seat belt alarm set, and all needs within reach.    Mobility Transfers Transfers: Sit to Stand;Stand to Sit;Stand Pivot Transfers Sit to Stand: Minimal Assistance - Patient > 75% Stand to Sit: Minimal Assistance - Patient > 75% Stand Pivot Transfers: Contact Guard/Touching assist Stand Pivot Transfer Details: Verbal cues for safe use of DME/AE;Manual facilitation for weight shifting;Verbal cues for precautions/safety Stand Pivot Transfer  Details (indicate cue type and reason): power up from bed wiht no rails Transfer (Assistive device): Rolling walker Locomotion  Gait Ambulation: Yes Gait Assistance: Contact Guard/Touching assist Gait Distance (Feet): 80 Feet Assistive device: Rolling walker Gait Gait: Yes Gait Pattern: Step-through pattern;Decreased stride length;Decreased hip/knee flexion - right;Decreased hip/knee flexion - left;Lateral hip instability;Lateral trunk lean to right;Decreased trunk rotation;Trunk flexed Gait velocity: decreased Stairs / Additional Locomotion Stairs: No (limited by pain/activity tolerance) Wheelchair Mobility Wheelchair Mobility: Yes Wheelchair Assistance: Chartered loss adjuster: Both upper extremities Wheelchair Parts Management: Needs assistance Distance: >150 ft   Discharge Criteria: Patient will be discharged from PT if patient refuses treatment 3 consecutive times without medical reason, if treatment goals not met, if there is a change in medical status, if patient makes no progress towards goals or if patient is discharged from hospital.  The above assessment, treatment plan, treatment alternatives and goals were discussed and mutually agreed upon: by patient  Doreene Burke PT, DPT  01/31/2020, 12:48 PM

## 2020-01-31 NOTE — Progress Notes (Signed)
[name], MD  Physician  Physical Medicine and Rehabilitation  PMR Pre-admission    Signed  Date of Service:  01/30/2020 11:12 AM      Related encounter: Admission (Discharged) from 01/24/2020 in [place]       Signed         Show:Clear all [x] Manual[x] Template[x] Copied  Added by: [x] Lovorn, Megan, MD[x] Michel Santee, PT   [] Hover for details  PMR Admission Coordinator Pre-Admission Assessment  Patient: Melissa Velazquez is an 70 y.o., female MRN: [id number] DOB: 09/17/1950 Height: 5\' 4"  (162.6 cm) Weight: 88.2 kg  Insurance Information HMO:     PPO:      PCP:      IPA:      80/20:      OTHER:  PRIMARY: Medicare A and B      Policy#: [id number]      Subscriber: pt CM Name: n/a      Phone#:      Fax#:  Pre-Cert#: verified Civil engineer, contracting:  Benefits:  Phone #:      Name:  Eff. Date: 05/19/16 A and B     Deduct: $1556      Out of Pocket Max: n/a      Life Max: n/a CIR: 100%      SNF: 20 full days Outpatient: 80%     Co-Ins: 20% Home Health: 100%      Co-Pay:  DME: 80%     Co-Ins: 20% Providers: pt choice SECONDARY: [employer]      Policy#: [id number]     Phone#: [phone]  Financial Counselor:       Phone#:   The "Data Collection Information Summary" for patients in Inpatient Rehabilitation Facilities with attached "Privacy Act [place] Records" was provided and verbally reviewed with: Patient  Emergency Contact Information         Contact Information    Name Relation Home Work Mobile   [place] Sister [phone]  [phone]      Current Medical History  Patient Admitting Diagnosis: bilateral TKA and post-op AF with RVR History of Present Illness: PT is a 70 y/o with PMH of HTN, mild asthma, glucose intolerance, back pain, bilateral Knee OA with failure of conservative therapy who underwent B-TKR on 01/24/19 per Dr. Wynelle Link. Post surgery ABLA has been stable and  thrombocytopenia has resolved. On early am of 01/29/19,she developed A fib with RVR and presyncope with BP 84/45 and HR 170's and hypoxia requiring supplemental O2.  She converted to NSR with IV BB.  2D echo done revealing EF 60-65% with mild concentric and moderate basal septal hypertrophy, moderate PAH as well as large pleural effusion in left lateral region. Follow up CXR was negative for significant pleural effusion. She continues to require supplemental oxygen with recommendations to wean as able as well as follow up with cardiology for evaluation as an outpatient. To continue Xarelto which was added for DVT prophylaxis --[id number] 4. Therapy evaluations were completed and pt was recommended for CIR.   [facility] has been reviewed by the rehabilitation admission coordinator and physician.  Past Medical History      Past Medical History:  Diagnosis Date  . Allergic rhinitis   . Anal fissure    Hx of   . Anxiety    hx of  . Arthritis   . Asthma    none in last year   .  Chronic kidney disease    partial nephrectomy  . Depression    hx of  . Diabetes mellitus without complication (HCC)    no meds  . Diverticular disease   . Dysrhythmia    palpitations  . Eczema   . Endometrial cancer ([place]) 2001   spread to appendix   . Hemorrhoids   . Hyperglycemia   . Hypertension   . Hypothyroidism   . Kidney cancer, primary, with metastasis from kidney to other site [facility])   . Obesity   . Sebaceous cyst   . Thyroid disease   . Thyroid nodule   . 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    Family History   family history includes Asthma in her sister; Breast cancer in her maternal aunt and mother; COPD in her father; Diabetes in her mother; Eczema in her sister; Obesity in her sister; Prostate cancer in her father; Urticaria in her maternal grandfather.  Prior Rehab/Hospitalizations Has the patient had  prior rehab or hospitalizations prior to admission? No  Has the patient had major surgery during 100 days prior to admission? Yes             Current Medications  Current Facility-Administered Medications:  .  albuterol (VENTOLIN HFA) 108 (90 Base) MCG/ACT inhaler 2 puff, 2 puff, Inhalation, Q6H PRN, Edmisten, Kristie L, PA .  bisacodyl (DULCOLAX) suppository 10 mg, 10 mg, Rectal, Daily PRN, Edmisten, Kristie L, PA .  diphenhydrAMINE (BENADRYL) 12.5 MG/5ML elixir 12.5-25 mg, 12.5-25 mg, Oral, Q4H PRN, Edmisten, Kristie L, PA .  docusate sodium (COLACE) capsule 100 mg, 100 mg, Oral, BID, Edmisten, Kristie L, PA, 100 mg at 01/30/20 0816 .  fluticasone (FLONASE) 50 MCG/ACT nasal spray 1 spray, 1 spray, Each Nare, BID PRN, Edmisten, Kristie L, PA, 1 spray at 01/26/20 2104 .  gabapentin (NEURONTIN) capsule 300 mg, 300 mg, Oral, TID, Edmisten, Kristie L, PA, 300 mg at 01/30/20 0819 .  levothyroxine (SYNTHROID) tablet 75 mcg, 75 mcg, Oral, QAC breakfast, Edmisten, Kristie L, PA, 75 mcg at 01/30/20 0525 .  loratadine (CLARITIN) tablet 10 mg, 10 mg, Oral, Daily, Edmisten, Kristie L, PA, 10 mg at 01/30/20 0819 .  menthol-cetylpyridinium (CEPACOL) lozenge 3 mg, 1 lozenge, Oral, PRN **OR** phenol (CHLORASEPTIC) mouth spray 1 spray, 1 spray, Mouth/Throat, PRN, Edmisten, Kristie L, PA .  metoCLOPramide (REGLAN) tablet 5-10 mg, 5-10 mg, Oral, Q8H PRN **OR** metoCLOPramide (REGLAN) injection 5-10 mg, 5-10 mg, Intravenous, Q8H PRN, Edmisten, Kristie L, PA .  ondansetron (ZOFRAN) tablet 4 mg, 4 mg, Oral, Q6H PRN **OR** ondansetron (ZOFRAN) injection 4 mg, 4 mg, Intravenous, Q6H PRN, Edmisten, Kristie L, PA .  polyethylene glycol (MIRALAX / GLYCOLAX) packet 17 g, 17 g, Oral, Daily PRN, Edmisten, Kristie L, PA .  rivaroxaban (XARELTO) tablet 10 mg, 10 mg, Oral, Daily, Childress, Sean D, PA-C, 10 mg at 01/30/20 0817 .  sertraline (ZOLOFT) tablet 150 mg, 150 mg, Oral, Q breakfast, Edmisten, Kristie L, PA, 150 mg at  01/30/20 0817 .  sodium phosphate (FLEET) 7-19 GM/118ML enema 1 enema, 1 enema, Rectal, Once PRN, Edmisten, Kristie L, PA .  traMADol (ULTRAM) tablet 50-100 mg, 50-100 mg, Oral, Q6H PRN, Justice Britain, MD, 100 mg at 01/30/20 0816  Patients Current Diet:     Diet Order                  Diet - low sodium heart healthy            Diet regular Room service  appropriate? Yes; Fluid consistency: Thin  Diet effective now                  Precautions / Restrictions Precautions Precautions: Fall,Knee Restrictions Weight Bearing Restrictions: No Other Position/Activity Restrictions: WBAT   Has the patient had 2 or more falls or a fall with injury in the past year? No  Prior Activity Level  Prior Functional Level Self Care: Did the patient need help bathing, dressing, using the toilet or eating? Independent  Indoor Mobility: Did the patient need assistance with walking from room to room (with or without device)? Independent  Stairs: Did the patient need assistance with internal or external stairs (with or without device)? Independent  Functional Cognition: Did the patient need help planning regular tasks such as shopping or remembering to take medications? Independent  Home Assistive Devices / Equipment Home Assistive Devices/Equipment: Bedside commode/3-in-1,Raised toilet seat with rails  Prior Device Use: Indicate devices/aids used by the patient prior to current illness, exacerbation or injury? None of the above  Current Functional Level Cognition  Overall Cognitive Status: Within Functional Limits for tasks assessed Orientation Level: Oriented X4 General Comments: AxO x 3 very pleasant    Extremity Assessment (includes Sensation/Coordination)  Upper Extremity Assessment: Overall WFL for tasks assessed  Lower Extremity Assessment: Defer to PT evaluation RLE Deficits / Details: trace quads and hip flexors; no DF; AAROM to 75 flex LLE Deficits /  Details: 2-/5 quads with AAROM -5 - 75    ADLs  Overall ADL's : Needs assistance/impaired Eating/Feeding: Independent Grooming: Standing,Supervision/safety,Wash/dry face,Oral care,Wash/dry hands Grooming Details (indicate cue type and reason): Pt able to stand at sink, and demonstrated positioning RW correctly anterior to sink without cues. Pt stood for 8 min and performed grooming/hygiene. No dizziness/lighheadedness and vitals stable. Upper Body Bathing: Set up,Sitting Lower Body Bathing: Maximal assistance,Set up,Sitting/lateral leans Upper Body Dressing : Set up,Sitting Lower Body Dressing: Total assistance,Sit to/from stand,+2 for safety/equipment,+2 for physical assistance Toilet Transfer: +2 for safety/equipment,+2 for physical assistance,Minimal assistance,BSC,Stand-pivot,RW Toileting- Clothing Manipulation and Hygiene: Total assistance,Sit to/from stand Toileting - Clothing Manipulation Details (indicate cue type and reason): Pt denied need to void. Functional mobility during ADLs: Min guard,Minimal assistance,Rolling walker    Mobility  Overal bed mobility: Needs Assistance Bed Mobility: Supine to Sit,Sit to Supine Supine to sit: Min assist Sit to supine: Min assist General bed mobility comments: Increased effort and pt able to raise LEs partially up with Min Assist to completely bring back to bed.  Pt able to scoot in sitting and in supine Mod I.    Transfers  Overall transfer level: Needs assistance Equipment used: Rolling walker (2 wheeled) Transfers: Sit to/from Stand Sit to Stand: Min assist Stand pivot transfers: Min assist General transfer comment: Pt stood from EOB with cues for hand placement and increased effort with Min As after pt rocking forward to gain momentum to power up. Increased time to come to full standing position.    Ambulation / Gait / Stairs / Wheelchair Mobility  Ambulation/Gait Ambulation/Gait assistance: Counsellor (Feet):  24 Feet Assistive device: Rolling walker (2 wheeled) Gait Pattern/deviations: Step-to pattern,Decreased step length - right,Decreased step length - left,Shuffle,Trunk flexed General Gait Details: cues for posture, sequence, position from RW Gait velocity: decr    Posture / Balance Dynamic Sitting Balance Sitting balance - Comments: Pt attempting to fall fwd from side of bed and requiring mod assist to correct and regain balance Balance Overall balance assessment: Needs assistance Sitting-balance support: No  upper extremity supported,Feet supported Sitting balance-Leahy Scale: Good Sitting balance - Comments: Pt attempting to fall fwd from side of bed and requiring mod assist to correct and regain balance Standing balance support: Bilateral upper extremity supported Standing balance-Leahy Scale: Poor Standing balance comment: Pt able to stand at sink and use BUEs for grooming/hygiene with compensation of forearms resting on vanity as needed.  Reliant on RW for BUE support during dynamic standing activities.    Special needs/care consideration N/a   Previous Home Environment (from acute therapy documentation) Living Arrangements: Alone Home Care Services: No  Discharge Living Setting Plans for Discharge Living Setting: Patient's home,Alone Type of Home at Discharge: House Discharge Home Layout: One level Discharge Home Access: Stairs to enter Entrance Stairs-Rails: Surveyor, mining of Steps: 4 Discharge Bathroom Shower/Tub: Tub/shower unit Discharge Bathroom Toilet: Standard Discharge Bathroom Accessibility: Yes How Accessible: Accessible via walker Does the patient have any problems obtaining your medications?: No  Social/Family/Support Systems Anticipated Caregiver: mod I goals, sister Hoyle Sauer) is local and can help intermittently with IADLs Anticipated Caregiver's Contact Information: [phone] Caregiver Availability: Intermittent Discharge Plan  Discussed with Primary Caregiver: Yes Is Caregiver In Agreement with Plan?: Yes  Goals Patient/Family Goal for Rehab: PT/OT mod I, SLP n/a Expected length of stay: 10-14 days Pt/Family Agrees to Admission and willing to participate: Yes Program Orientation Provided & Reviewed with Pt/Caregiver Including Roles  & Responsibilities: Yes  Barriers to Discharge: Decreased caregiver support  Decrease burden of Care through IP rehab admission: n/a  Possible need for SNF placement upon discharge: Not anticipated. Pt with mod I goals.   Patient Condition: I have reviewed medical records from [facility], spoken with CM, and patient and family member. I discussed via phone for inpatient rehabilitation assessment.  Patient will benefit from ongoing PT and OT, can actively participate in 3 hours of therapy a day 5 days of the week, and can make measurable gains during the admission.  Patient will also benefit from the coordinated team approach during an Inpatient Acute Rehabilitation admission.  The patient will receive intensive therapy as well as Rehabilitation physician, nursing, social worker, and care management interventions.  Due to safety, skin/wound care, medication administration, pain management and patient education the patient requires 24 hour a day rehabilitation nursing.  The patient is currently min to mod assist with mobility and basic ADLs.  Discharge setting and therapy post discharge at home with home health is anticipated.  Patient has agreed to participate in the Acute Inpatient Rehabilitation Program and will admit today.  Preadmission Screen Completed By:  Michel Santee, PT, DPT 01/30/2020 11:23 AM ______________________________________________________________________   Discussed status with Dr. Dagoberto Ligas on 01/30/20  at 11:23 AM and received approval for admission today.  Admission Coordinator:  Michel Santee, PT, DPT time 11:23 AM Sudie Grumbling 01/30/20     Assessment/Plan: Diagnosis: 1. Does the need for close, 24 hr/day Medical supervision in concert with the patient's rehab needs make it unreasonable for this patient to be served in a less intensive setting? Yes 2. Co-Morbidities requiring supervision/potential complications: B/L TKA's, A fib with RVR, hypothyroidism, diet controlled DM 3. Due to bowel management, safety, skin/wound care, disease management, medication administration, pain management and patient education, does the patient require 24 hr/day rehab nursing? Yes 4. Does the patient require coordinated care of a physician, rehab nurse, PT, OT, and SLP to address physical and functional deficits in the context of the above medical diagnosis(es)? Yes Addressing deficits in the following areas:  balance, endurance, locomotion, strength, transferring, bathing, dressing, feeding, grooming and toileting 5. Can the patient actively participate in an intensive therapy program of at least 3 hrs of therapy 5 days a week? Yes 6. The potential for patient to make measurable gains while on inpatient rehab is good 7. Anticipated functional outcomes upon discharge from inpatient rehab: modified independent PT, modified independent OT, n/a SLP 8. Estimated rehab length of stay to reach the above functional goals is: 10-14 days 9. Anticipated discharge destination: Home 10. Overall Rehab/Functional Prognosis: good   MD Signature:           Revision History                        Note Details  Jan Fireman, MD File Time 01/30/2020 12:18 PM  Author Type Physician Status Signed  Last Editor [name], MD Service Physical Medicine and [place] # [id number] Admit Date 01/30/2020

## 2020-01-31 NOTE — Evaluation (Signed)
Occupational Therapy Assessment and Plan  Patient Details  Name: Melissa Velazquez MRN: [id number] Date of Birth: 03-02-1950  OT Diagnosis: acute pain, muscle weakness (generalized), pain in joint and swelling of limb Rehab Potential: Rehab Potential (ACUTE ONLY): Good ELOS: 7-10   Today's Date: 01/31/2020 OT Individual Time: 0800-0900 OT Individual Time Calculation (min): 60 min     Hospital Problem: Principal Problem:   OA (osteoarthritis) of knee   Past Medical History:  Past Medical History:  Diagnosis Date  . Allergic rhinitis   . Anal fissure    Hx of   . Anxiety    hx of  . Arthritis   . Asthma    none in last year   . Chronic kidney disease    partial nephrectomy  . Depression    hx of  . Diabetes mellitus without complication (HCC)    no meds  . Diverticular disease   . Dysrhythmia    palpitations  . Eczema   . Endometrial cancer ([place]) 2001   spread to appendix   . Hemorrhoids   . Hyperglycemia   . Hypertension   . Hypothyroidism   . Kidney cancer, primary, with metastasis from kidney to other site [facility])   . Obesity   . Sebaceous cyst   . Thyroid disease   . Thyroid nodule   . 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   Past Surgical History:  Past Surgical History:  Procedure Laterality Date  . APPENDECTOMY  2008  . BIOPSY  12/14/2016   Procedure: BIOPSY;  Surgeon: Rogene Houston, MD;  [facility];  Service: Endoscopy;;  colon  . CHOLECYSTECTOMY  2008  . COLONOSCOPY N/A 11/24/2012   Procedure: COLONOSCOPY;  Surgeon: Rogene Houston, MD;  [facility];  Service: Endoscopy;  Laterality: N/A;  830  . COLONOSCOPY N/A 12/14/2016   Procedure: COLONOSCOPY;  Surgeon: Rogene Houston, MD;  [facility];  Service: Endoscopy;  Laterality: N/A;  12:00  . POLYPECTOMY  12/14/2016   Procedure: POLYPECTOMY;  Surgeon: Rogene Houston, MD;  [facility];  Service: Endoscopy;;  colon  . ROBOTIC ASSITED PARTIAL  NEPHRECTOMY Left 08/21/2016   Procedure: XI ROBOTIC ASSITED PARTIAL NEPHRECTOMY;  Surgeon: Ardis Hughs, MD;  Location: [facility];  Service: Urology;  Laterality: Left;  . TOTAL ABDOMINAL HYSTERECTOMY  2001  . TOTAL KNEE ARTHROPLASTY Bilateral 01/24/2020   Procedure: TOTAL KNEE BILATERAL;  Surgeon: Gaynelle Arabian, MD;  Location: [facility];  Service: Orthopedics;  Laterality: Bilateral;  . WISDOM TOOTH EXTRACTION      Assessment & Plan Clinical Impression:  Pt s/p Bil TKR and with hx DM, CKD, and Kidney CA s/p partial nephrectomy  Patient currently requires min with basic self-care skills secondary to muscle weakness, decreased cardiorespiratoy endurance and decreased standing balance, decreased postural control and decreased balance strategies.  Prior to hospitalization, patient could complete BADL with independent .  Patient will benefit from skilled intervention to increase independence with basic self-care skills prior to discharge home with care partner.  Anticipate patient will require intermittent supervision and follow up home health.  OT - End of Session Activity Tolerance: Tolerates 30+ min activity with multiple rests Endurance Deficit: Yes OT Assessment Rehab Potential (ACUTE ONLY): Good OT Barriers to Discharge: Decreased caregiver support;Inaccessible home environment OT Patient demonstrates impairments in the following area(s): Balance;Endurance;Edema;Pain;Safety OT Basic ADL's Functional Problem(s): Grooming;Bathing;Toileting;Dressing OT Transfers Functional Problem(s): Toilet;Tub/Shower OT Plan OT Intensity: Minimum of 1-2 x/day, 45  to 90 minutes OT Frequency: 5 out of 7 days OT Duration/Estimated Length of Stay: 7-10 OT Treatment/Interventions: Balance/vestibular training;Discharge planning;Pain management;Self Care/advanced ADL retraining;Therapeutic Activities;UE/LE Coordination activities;Therapeutic Exercise;Skin care/wound managment;Patient/family education;Functional  mobility training;Disease mangement/prevention;Community reintegration;DME/adaptive equipment instruction;Neuromuscular re-education;Psychosocial support;UE/LE Strength taining/ROM;Wheelchair propulsion/positioning OT Self Feeding Anticipated Outcome(s): MOD I OT Basic Self-Care Anticipated Outcome(s): MOD I OT Toileting Anticipated Outcome(s): MOD I OT Bathroom Transfers Anticipated Outcome(s): MOD I OT Recommendation Patient destination: Home Follow Up Recommendations: Home health OT Equipment Recommended: 3 in 1 bedside comode Equipment Details: sister ordering TTB   OT Evaluation Precautions/Restrictions  Precautions Precautions: Fall;Knee Precaution Comments: instructed no pillows under knee Restrictions Weight Bearing Restrictions: Yes RLE Weight Bearing: Weight bearing as tolerated LLE Weight Bearing: Weight bearing as tolerated General   Vital Signs Therapy Vitals Temp: 98.7 F (37.1 C) Pulse Rate: 80 Resp: 16 BP: 125/79 Patient Position (if appropriate): Sitting Oxygen Therapy SpO2: 98 % O2 Device: Room Air Pain   Home Living/Prior Functioning Home Living Family/patient expects to be discharged to:: Private residence Living Arrangements: Alone Available Help at Discharge: Family (pt's sister available to check on her intermittently in person and by phone) Type of Home: House Home Access: Stairs to enter Technical brewer of Steps: 4 Entrance Stairs-Rails: Right,Left Home Layout: One level Bathroom Shower/Tub: Chiropodist: Standard (has a riser and arm rests) Additional Comments: sistter planning on buying TTB  Lives With: Alone IADL History Homemaking Responsibilities: Yes Meal Prep Responsibility: Primary Laundry Responsibility: Primary Cleaning Responsibility: Primary Bill Paying/Finance Responsibility: Primary Shopping Responsibility: Primary Child Care Responsibility: Primary Homemaking Comments: sister could assist as  needed Current License: Yes Leisure and Hobbies: reading, walking, taking care of dog Prior Function Level of Independence: Independent with homemaking with ambulation,Independent with basic ADLs  Able to Take Stairs?: Yes Driving: Yes 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 Vision Baseline Vision/History: Wears glasses Wears Glasses: Reading only Patient Visual Report: No change from baseline Vision Assessment?: No apparent visual deficits Perception  Perception: Within Functional Limits Praxis Praxis: Intact Cognition Overall Cognitive Status: Within Functional Limits for tasks assessed Arousal/Alertness: Awake/alert Orientation Level: Person;Place;Situation Person: Oriented Place: Oriented Situation: Oriented Year: 2022 Month: January Day of Week: Correct Memory: Appears intact Immediate Memory Recall: Sock;Blue;Bed Memory Recall Sock: Without Cue Memory Recall Blue: Without Cue Memory Recall Bed: Without Cue Awareness: Appears intact Problem Solving: Appears intact Safety/Judgment: Appears intact Sensation Sensation Light Touch: Appears Intact Coordination Gross Motor Movements are Fluid and Coordinated: Yes Fine Motor Movements are Fluid and Coordinated: Yes Coordination and Movement Description: UE Motor  Motor Motor: Within Functional Limits Motor - Skilled Clinical Observations: limted by pain in surgical sites  Trunk/Postural Assessment  Cervical Assessment Cervical Assessment: Within Functional Limits Thoracic Assessment Thoracic Assessment: Within Functional Limits Lumbar Assessment Lumbar Assessment: Within Functional Limits Postural Control Postural Control: Deficits on evaluation (delayed d/t pain)  Balance Balance Balance Assessed: Yes Dynamic Sitting Balance Dynamic Sitting - Balance Support: During functional activity Dynamic Sitting - Level of Assistance: 5: Stand by assistance Dynamic Standing Balance Dynamic  Standing - Balance Support: During functional activity Dynamic Standing - Level of Assistance: 4: Min assist Extremity/Trunk Assessment RUE Assessment RUE Assessment: Within Functional Limits LUE Assessment LUE Assessment: Within Functional Limits  Care Tool Care Tool Self Care Eating   Eating Assist Level: Set up assist    Oral Care    Oral Care Assist Level: Set up assist    Bathing   Body parts bathed by patient: Right arm;Left arm;Chest;Abdomen;Front  perineal area;Right upper leg;Left upper leg;Face Body parts bathed by helper: Buttocks;Right lower leg;Left lower leg   Assist Level: Moderate Assistance - Patient 50 - 74%    Upper Body Dressing(including orthotics)   What is the patient wearing?: Pull over shirt   Assist Level: Set up assist    Lower Body Dressing (excluding footwear)   What is the patient wearing?: Underwear/pull up;Pants Assist for lower body dressing: Moderate Assistance - Patient 50 - 74%    Putting on/Taking off footwear   What is the patient wearing?: Non-skid slipper socks Assist for footwear: Dependent - Patient 0%       Care Tool Toileting Toileting activity   Assist for toileting: Minimal Assistance - Patient > 75%     Care Tool Bed Mobility Roll left and right activity        Sit to lying activity        Lying to sitting edge of bed activity         Care Tool Transfers Sit to stand transfer        Chair/bed transfer         Toilet transfer   Assist Level: Moderate Assistance - Patient 50 - 74%     Care Tool Cognition Expression of Ideas and Wants Expression of Ideas and Wants: Without difficulty (complex and basic) - expresses complex messages without difficulty and with speech that is clear and easy to understand   Understanding Verbal and Non-Verbal Content Understanding Verbal and Non-Verbal Content: Understands (complex and basic) - clear comprehension without cues or repetitions   Memory/Recall Ability *first 3  days only Memory/Recall Ability *first 3 days only: Current season;Location of own room;Staff names and faces;That he or she is in a hospital/hospital unit    Refer to Care Plan for [place] 1 OT Short Term Goal 1 (Week 1): STG=LTG d/t ELOS  Recommendations for other services: None    Skilled Therapeutic Intervention 1:1. Pt received in bed educated on OT purpose/role, CIR, ELOS, and goals. Pt with pain in B knees but recieves medicaiton during session from RN. Pt is MIN A for all mobility except sit to stand from EOB with MOD A for lifting. Pt requires ED on hand placement on RW. See shower level ADL details below. Pt would benefit from AE training for LB dressing. Exited session with pt seated in bed, exit alarm on and call light in reach   ADL ADL Eating: Set up Where Assessed-Eating: Bed level Grooming: Setup Where Assessed-Grooming: Edge of bed Upper Body Bathing: Supervision/safety Where Assessed-Upper Body Bathing: Shower Lower Body Bathing: Moderate assistance Where Assessed-Lower Body Bathing: Shower Upper Body Dressing: Setup Where Assessed-Upper Body Dressing: Chair Lower Body Dressing: Maximal assistance Where Assessed-Lower Body Dressing: Chair Toileting: Minimal assistance Toilet Transfer: Minimal assistance Toilet Transfer Method: Ambulating Mobility  Transfers Sit to Stand: Moderate Assistance - Patient 50-74% Stand to Sit: Minimal Assistance - Patient > 75%   Discharge Criteria: Patient will be discharged from OT if patient refuses treatment 3 consecutive times without medical reason, if treatment goals not met, if there is a change in medical status, if patient makes no progress towards goals or if patient is discharged from hospital.  The above assessment, treatment plan, treatment alternatives and goals were discussed and mutually agreed upon: by patient  Tonny Branch 01/31/2020, 12:34 PM

## 2020-01-31 NOTE — Discharge Instructions (Addendum)
Inpatient Rehab Discharge Instructions  Melissa Velazquez Discharge date and time:  02/11/20  Activities/Precautions/ Functional Status: Activity: no lifting, driving, or strenuous exercise till cleared by MD Diet: regular diet Wound Care: keep wound clean and dry Contact Dr. Wynelle Link if you develop any problems with your incision/wound--redness, swelling, increase in pain, drainage or if you develop fever or chills.     ___ No restrictions     ___ Walk up steps independently ___ 24/7 supervision/assistance   ___ Walk up steps with assistance _X__ Intermittent supervision/assistance  _X__ Bathe/dress independently ___ Walk with walker     ___ Bathe/dress with assistance _X__ Walk Independently    ___ Shower independently ___ Walk with assistance    ___ Shower with assistance _X__ No alcohol     ___ Return to work/school ________   COMMUNITY REFERRALS UPON DISCHARGE:    Home Health:   PT     OT                      Agency: [facility]    Phone: [phone] *Please expect follow-up within 2-3 days to schedule your home visit. If you have not received follow-up, be sure to contact the branch directly.*    Medical Equipment/Items Ordered: rolling walker (has already)                                                 Agency/Supplier: N/A    Special Instructions:    My questions have been answered and I understand these instructions. I will adhere to these goals and the provided educational materials after my discharge from the hospital.  Patient/Caregiver Signature _______________________________ Date __________  Clinician Signature _______________________________________ Date __________  Please bring this form and your medication list with you to all your follow-up doctor's appointments. Information on my medicine - XARELTO (Rivaroxaban)  This medication education was reviewed with me or my healthcare representative as part of my discharge  preparation.   Why was Xarelto prescribed for you? Xarelto was prescribed for you to reduce the risk of blood clots forming after orthopedic surgery. The medical term for these abnormal blood clots is venous thromboembolism (VTE).  What do you need to know about xarelto ? Take your Xarelto ONCE DAILY at the same time every day until 02/17/20 then stop  You may take it either with or without food.  If you have difficulty swallowing the tablet whole, you may crush it and mix in applesauce just prior to taking your dose.  Take Xarelto exactly as prescribed by your doctor and DO NOT stop taking Xarelto without talking to the doctor who prescribed the medication.  Stopping without other VTE prevention medication to take the place of Xarelto may increase your risk of developing a clot.  After discharge, you should have regular check-up appointments with your healthcare provider that is prescribing your Xarelto.    What do you do if you miss a dose? If you miss a dose, take it as soon as you remember on the same day then continue your regularly scheduled once daily regimen the next day. Do not take two doses of Xarelto on the same day.   Important Safety Information A possible side effect of Xarelto is bleeding. You should call your healthcare provider right away if you experience any of the following: ?  Bleeding from an injury or your nose that does not stop. ? Unusual colored urine (red or dark brown) or unusual colored stools (red or black). ? Unusual bruising for unknown reasons. ? A serious fall or if you hit your head (even if there is no bleeding).  Some medicines may interact with Xarelto and might increase your risk of bleeding while on Xarelto. To help avoid this, consult your healthcare provider or pharmacist prior to using any new prescription or non-prescription medications, including herbals, vitamins, non-steroidal anti-inflammatory drugs (NSAIDs) and supplements.  This  website has more information on Xarelto: [email or web address]

## 2020-01-31 NOTE — Progress Notes (Signed)
Occupational Therapy Session Note  Patient Details  Name: Melissa Velazquez MRN: [id number] Date of Birth: 07-Sep-1950  Today's Date: 01/31/2020 OT Individual Time: 1130-1200 OT Individual Time Calculation (min): 30 min    Short Term Goals: Week 1:   STG=LTG secondary to ELOS  Skilled Therapeutic Interventions/Progress Updates:    Pt resting in w/c upon arrival.  Discussed role of OT and DME requirements/needs for toileting and shower. Pt transported to tub room for time management. Pt practiced TTB transfers (sit<>stand only) with CGA. Pt provided information about TTB for purchase. Pt returned to room and remained in w/c with belt alarm activated. All needs within reach.  Therapy Documentation Precautions:  Precautions Precautions: Fall,Knee Precaution Comments: instructed no pillows under knee Restrictions Weight Bearing Restrictions: Yes RLE Weight Bearing: Weight bearing as tolerated LLE Weight Bearing: Weight bearing as tolerated Other Position/Activity Restrictions: WBAT Pain:  Pt c/o 4/10 B knee pain; repositioned and emotional support   Therapy/Group: Individual Therapy  Leroy Libman 01/31/2020, 12:15 PM

## 2020-01-31 NOTE — Progress Notes (Signed)
Inpatient Rehabilitation  Patient information reviewed and entered into eRehab system by Lavonne Kinderman M. Naylene Foell, M.A., CCC/SLP, PPS Coordinator.  Information including medical coding, functional ability and quality indicators will be reviewed and updated through discharge.    

## 2020-01-31 NOTE — Progress Notes (Signed)
Physical Therapy Session Note  Patient Details  Name: Melissa Velazquez MRN: [id number] Date of Birth: 12/29/1950  Today's Date: 01/31/2020 PT Individual Time: 1345-1435 PT Individual Time Calculation (min): 50 min   Short Term Goals: Week 1:  PT Short Term Goal 1 (Week 1): STG=LTG due to short ELOS.  Skilled Therapeutic Interventions/Progress Updates: Pt presented in w/c agreeable to therapy. Pt states pain 5/10 prior to session, premedicated and rest breaks provided as needed. Pt transported to rehab gym for energy conservation and time management. Performed stand pivot to mat with RW and CGA, pt noted to have significant forward flexed posture to compensate for decreased knee flexion. Pt participated in follow seated and supine therex as follows: seated LAQ, supine QS, AA heel slides, SAQ, hip abd/add 2 x 10 bilaterally. Pt required minA for sit to supine and was able to transition into long sit and perform anterior scoot to EOM at end of exercises. Pt then ambulated to stairs ~33f with CGA and ascended/descened x 6 steps (3in) with CGA and B rails. Pt returned to w/c and transported back to room for time management. Pt then performed ambulatory transfer to bed and performed sit to supine with minA. PTA obtained and placed ice packs on B knees for pain management and instructed pt to remove in approx 20 min. Pt left in bed at end of session with bed alarm on, call bell within reach and needs met.   Therapy Documentation Precautions:  Precautions Precautions: Fall,Knee Precaution Comments: instructed no pillows under knee Restrictions Weight Bearing Restrictions: Yes RLE Weight Bearing: Weight bearing as tolerated LLE Weight Bearing: Weight bearing as tolerated Other Position/Activity Restrictions: WBAT General:   Vital Signs: Therapy Vitals Temp: 98.2 F (36.8 C) Pulse Rate: 80 Resp: 16 BP: 126/62 Patient Position (if appropriate): Sitting Oxygen Therapy SpO2: 99 % O2 Device: Room  Air Pain: Pain Assessment Pain Score: 4  Mobility: Transfers Transfers: Sit to Stand;Stand to SLockheed MartinTransfers Sit to Stand: Minimal Assistance - Patient > 75% Stand to Sit: Minimal Assistance - Patient > 75% Stand Pivot Transfers: Contact Guard/Touching assist Transfer (Assistive device): Rolling walker Locomotion : Gait Ambulation: Yes Gait Assistance: Contact Guard/Touching assist Gait Distance (Feet): 80 Feet Assistive device: Rolling walker Gait Gait: Yes Gait Pattern: Step-through pattern;Decreased stride length;Decreased hip/knee flexion - right;Decreased hip/knee flexion - left;Lateral hip instability;Lateral trunk lean to right;Decreased trunk rotation;Trunk flexed Gait velocity: decreased Stairs / Additional Locomotion Stairs: No (limited by pain/activity tolerance) Wheelchair Mobility Wheelchair Mobility: Yes Wheelchair Assistance: SChartered loss adjuster Both upper extremities Wheelchair Parts Management: Needs assistance Distance: >150 ft  Trunk/Postural Assessment : Cervical Assessment Cervical Assessment: Within Functional Limits Thoracic Assessment Thoracic Assessment: Within Functional Limits Lumbar Assessment Lumbar Assessment: Within Functional Limits Postural Control Postural Control: Deficits on evaluation (delayed d/t pain)  Balance: Balance Balance Assessed: Yes Dynamic Sitting Balance Dynamic Sitting - Balance Support: During functional activity Dynamic Sitting - Level of Assistance: 5: Stand by assistance Dynamic Standing Balance Dynamic Standing - Balance Support: During functional activity Dynamic Standing - Level of Assistance: 4: Min assist Exercises:   Other Treatments:      Therapy/Group: Individual Therapy  Tammee Thielke 01/31/2020, 4:14 PM

## 2020-02-01 ENCOUNTER — Inpatient Hospital Stay (HOSPITAL_COMMUNITY): Payer: Medicare Other

## 2020-02-01 ENCOUNTER — Inpatient Hospital Stay (HOSPITAL_COMMUNITY): Payer: Medicare Other | Admitting: Physical Therapy

## 2020-02-01 DIAGNOSIS — Z9889 Other specified postprocedural states: Secondary | ICD-10-CM

## 2020-02-01 MED ORDER — ASPIRIN EC 81 MG PO TBEC: 81.0000 mg | DELAYED_RELEASE_TABLET | Freq: Every day | ORAL | Status: DC

## 2020-02-01 NOTE — Progress Notes (Signed)
Occupational Therapy Session Note  Patient Details  Name: Melissa Velazquez MRN: [id number] Date of Birth: 13-Jul-1950  Today's Date: 02/01/2020 OT Individual Time: 1100-1200 OT Individual Time Calculation (min): 60 min    Short Term Goals: Week 1:  OT Short Term Goal 1 (Week 1): STG=LTG d/t ELOS  Skilled Therapeutic Interventions/Progress Updates:    Pt resting in bed with CPM. After removing CPM, pt requested to use toilet and change underpants. Supine>sit EOB and amb with RW into bathroom with supervision. Toileting with supervision. Pt doffed underpants and pants with supervision. Pt required assistance threading LLE into pants but completed all other tasks with supervision. Pt transitioned to ADL apartment and practiced bed transfers and mobility-supervision. Pt able to doff/don socks/shoes with supervision. TTB transfers with supervision. Pt returned to room and transferred to bed with supervision. Pt remained in bed with all needs within reach and bed alarm activated.  Therapy Documentation Precautions:  Precautions Precautions: Fall,Knee Precaution Comments: instructed no pillows under knee Restrictions Weight Bearing Restrictions: No RLE Weight Bearing: Weight bearing as tolerated LLE Weight Bearing: Weight bearing as tolerated Other Position/Activity Restrictions: WBAT General:   Vital Signs: Therapy Vitals Temp: 98 F (36.7 C) Temp Source: Oral Pulse Rate: 80 Resp: 18 BP: 125/77 Patient Position (if appropriate): Lying Oxygen Therapy SpO2: 97 % O2 Device: Room Air Pain:   ADL: ADL Eating: Set up Where Assessed-Eating: Bed level Grooming: Setup Where Assessed-Grooming: Edge of bed Upper Body Bathing: Supervision/safety Where Assessed-Upper Body Bathing: Shower Lower Body Bathing: Moderate assistance Where Assessed-Lower Body Bathing: Shower Upper Body Dressing: Setup Where Assessed-Upper Body Dressing: Chair Lower Body Dressing: Maximal assistance Where  Assessed-Lower Body Dressing: Chair Toileting: Minimal assistance Toilet Transfer: Minimal assistance Toilet Transfer Method: Ambulating Vision   Perception    Praxis   Exercises:   Other Treatments:     Therapy/Group: Individual Therapy  Leroy Libman 02/01/2020, 12:04 PM

## 2020-02-01 NOTE — Progress Notes (Signed)
Orthopedic Tech Progress Note Patient Details:  Melissa Velazquez 09/20/1950 [id number]  Patient ID: Melissa Velazquez, Melissa Velazquez   DOB: 11/14/1950, 70 y.o.   MRN: [id number] Delivered to room. Instructed patient to call when finished eating breakfast.  Melissa Velazquez 02/01/2020, 7:00 AM

## 2020-02-01 NOTE — Progress Notes (Signed)
Physical Therapy Session Note  Patient Details  Name: Melissa Velazquez MRN: [id number] Date of Birth: [birth year]  Today's Date: 02/01/2020 PT Individual Time: 1300-1413 PT Individual Time Calculation (min): 73 min   Short Term Goals: Week 1:  PT Short Term Goal 1 (Week 1): STG=LTG due to short ELOS.  Skilled Therapeutic Interventions/Progress Updates:    pt received in bed and agreeable to therapy. Pt directed in supine>sit CGA, reported she needed to use restroom, Sit to stand to Rolling walker CGA and CGA gait to restroom, pt able to void bladder CGA for pericare and CGA for toilet transfers. Pt then directed in gait training with Rolling walker 150' CGA with VC for gait pattern with increased B step height and trunk extension and improved walker proximity. Pt required rest at this time and completed rest of distance in [facility] total A for time and energy. Pt directed in ascending/descending ramp with Rolling walker CGA and transferred to mat table to rest. Pt then directed in sit>supine on mat table for B knee extension stretching statically for 3x1 min each; 2x10 quad sets each with tactile cues to complete with improved form. Pt then directed in supine>sit CGA, directed in B knee flexion static stretching 4x30s each with pt actively bringing to her end range, limited 2/2 pain no over pressure provided 2/2 pt comfort at this time. Pt directed in seated BLE LAQ with 2# with good form noted however with x2 on each pt reported increased pain, B weight taken off and activity halted. Pt directed in x8 marching each LE. Pt directed in Stand pivot transfer to [facility] and directed in [facility] mobility back to room 300' min A for need of assist for turning and change of directions and intermittent pathway guidance. Pt directed in Stand pivot transfer to bedside CGA and min A for sit>supine. Pt left in bed, alarm set, All needs in reach and in good condition. Call light in hand.    Therapy Documentation Precautions:   Precautions Precautions: Fall,Knee Precaution Comments: instructed no pillows under knee Restrictions Weight Bearing Restrictions: No RLE Weight Bearing: Weight bearing as tolerated LLE Weight Bearing: Weight bearing as tolerated Other Position/Activity Restrictions: WBAT General:   Vital Signs: Therapy Vitals Pulse Rate: 79 Resp: 19 BP: 134/74 Patient Position (if appropriate): Lying Oxygen Therapy SpO2: 93 % O2 Device: Room Air Pain:   Mobility:   Locomotion :    Trunk/Postural Assessment :    Balance:   Exercises:   Other Treatments:      Therapy/Group: Individual Therapy  Junie Panning 02/01/2020, 3:18 PM

## 2020-02-01 NOTE — Progress Notes (Signed)
VASCULAR LAB    Bilateral lower extremity venous duplex has been performed.  See CV proc for preliminary results.   Florentina Marquart, RVT 02/01/2020, 3:50 PM

## 2020-02-01 NOTE — Progress Notes (Signed)
Physical Therapy Session Note  Patient Details  Name: Melissa Velazquez MRN: [id number] Date of Birth: 26-Jun-1950  Today's Date: 02/01/2020 PT Individual Time: 0900-1000 PT Individual Time Calculation (min): 60 min   Short Term Goals: Week 1:  PT Short Term Goal 1 (Week 1): STG=LTG due to short ELOS.  Skilled Therapeutic Interventions/Progress Updates: Pt presented in bed agreeable to therapy. Pt initially frustrated because she thought had therapy at 8:00, upon looking at schedule noted that it had both Wed and Thur times on sheet. Pt verbalized understanding. Pt requesting to brush teeth and was face prior to leaving room. Performed supine to sit with supervision and use of bed features. PTA donned shoes for time management and pt performed ambulatory transfer to sink. Performed oral hygiene and washed face in standing with SBA. Pt then sat in w/c and transported to day room. Performed ambulatory transfer to day NuStep. Participated in NuStep L1 x 10 min for AAROM with PTA bringing seat closer at 7 min. After 10 min ROM noted ~80degrees bilaterally. Pt then ambulated to mat with CGA and performed STS from 19in mat with RW and with emphasis on not sliding BLE out when sitting to work on B ROM. AROM taken after activity 80 degrees! Pt ambulated back to room with CGA and transferred to bed then supine with supervision. CPM set up per orders with PTA initially placing at 65 degrees however pt stating did not feel effective therefore increased to 75 degrees. Pt left in bed at end of session with CPM on RLE and ice pack on L knee with bed alarm on, call bell within reach and current needs met.      Therapy Documentation Precautions:  Precautions Precautions: Fall,Knee Precaution Comments: instructed no pillows under knee Restrictions Weight Bearing Restrictions: No RLE Weight Bearing: Weight bearing as tolerated LLE Weight Bearing: Weight bearing as tolerated Other Position/Activity Restrictions:  WBAT General:   Vital Signs: Therapy Vitals Pulse Rate: 79 Resp: 19 BP: 134/74 Patient Position (if appropriate): Lying Oxygen Therapy SpO2: 93 % O2 Device: Room Air  Therapy/Group: Individual Therapy  Avarey Yaeger  Daryle Amis, PTA  02/01/2020, 3:57 PM

## 2020-02-01 NOTE — Progress Notes (Signed)
[place] PHYSICAL MEDICINE & REHABILITATION PROGRESS NOTE   Subjective/Complaints:  Pt reports got shower yesterday - "was awesome"- Norco somewhat helpful for pain as well as tramadol- Ate 100% of breakfast.  Took off surgical dressings for pt- will place dry dressing  Pt also noted hasn't used CPM since got here, even though has it and was used at [employer].  Also have order for CPM.   ROS:  Pt denies SOB, abd pain, CP, N/V/C/D, and vision changes   Objective:   No results found. Recent Labs    01/31/20 0752  WBC 7.7  HGB 10.7*  HCT 31.7*  PLT 259   Recent Labs    01/31/20 0752  NA 136  K 3.7  CL 98  CO2 27  GLUCOSE 141*  BUN 13  CREATININE 0.97  CALCIUM 8.3*    Intake/Output Summary (Last 24 hours) at 02/01/2020 1112 Last data filed at 02/01/2020 0730 Gross per 24 hour  Intake 720 ml  Output -  Net 720 ml        Physical Exam: Vital Signs Blood pressure 125/77, pulse 80, temperature 98 F (36.7 C), temperature source Oral, resp. rate 18, height 5\' 4"  (1.626 m), weight 93.7 kg, SpO2 97 %.  Constitutional:  awake, alert, appropriate, RN in room, NAD HENT:  \conjugate gaze, tiny cloverleaf shaped sore inside of upper lip- appears pus filled and closed Cardiovascular: RRR Pulmonary: CTA B/L- no W/R/R- good air movement Abdominal: Soft, NT, ND, (+)BS  Musculoskeletal:     Cervical back: Normal range of motion. No rigidity.     Comments: UEs 5/5 in biceps, triceps, WE grip and finger abd LEs- 2/5 in HF B/L; KE/KF NT due to knee surgery, and DF and PF 5/5 B/L B/L knees look great with surgical dressings off- only drainage is some sanguinous trace drainage from distal aspect of L knee- bruising decreasing as well as swelling.  Neurological:  Intact to light touch in all 4 extremities Ox3 Psychiatric: appropriate    Assessment/Plan: 1. Functional deficits which require 3+ hours per day of interdisciplinary therapy in a comprehensive inpatient rehab  setting.  Physiatrist is providing close team supervision and 24 hour management of active medical problems listed below.  Physiatrist and rehab team continue to assess barriers to discharge/monitor patient progress toward functional and medical goals  Care Tool:  Bathing    Body parts bathed by patient: Right arm,Left arm,Chest,Abdomen,Front perineal area,Right upper leg,Left upper leg,Face   Body parts bathed by helper: Buttocks,Right lower leg,Left lower leg     Bathing assist Assist Level: Moderate Assistance - Patient 50 - 74%     Upper Body Dressing/Undressing Upper body dressing   What is the patient wearing?: Pull over shirt    Upper body assist Assist Level: Set up assist    Lower Body Dressing/Undressing Lower body dressing      What is the patient wearing?: Underwear/pull up,Pants     Lower body assist Assist for lower body dressing: Moderate Assistance - Patient 50 - 74%     Toileting Toileting    Toileting assist Assist for toileting: Minimal Assistance - Patient > 75%     Transfers Chair/bed transfer  Transfers assist     Chair/bed transfer assist level: Minimal Assistance - Patient > 75%     Locomotion Ambulation   Ambulation assist      Assist level: Contact Guard/Touching assist Assistive device: Walker-rolling Max distance: 80 ft   Walk 10 feet activity   Assist  Assist level: Contact Guard/Touching assist Assistive device: Walker-rolling   Walk 50 feet activity   Assist    Assist level: Contact Guard/Touching assist Assistive device: Walker-rolling    Walk 150 feet activity   Assist Walk 150 feet activity did not occur: Safety/medical concerns (limited by pain/activity tolerance)    Assistive device: Walker-rolling    Walk 10 feet on uneven surface  activity   Assist Walk 10 feet on uneven surfaces activity did not occur: Safety/medical concerns (limited by pain/activity tolerance)          Wheelchair     Assist Will patient use wheelchair at discharge?: No (patient progressing to a functional ambulator) Type of Wheelchair: Manual    Wheelchair assist level: Supervision/Verbal cueing      Wheelchair 50 feet with 2 turns activity    Assist        Assist Level: Supervision/Verbal cueing   Wheelchair 150 feet activity     Assist      Assist Level: Supervision/Verbal cueing   Blood pressure 125/77, pulse 80, temperature 98 F (36.7 C), temperature source Oral, resp. rate 18, height 5\' 4"  (1.626 m), weight 93.7 kg, SpO2 97 %.   Medical Problem List and Plan: 1.  Impaired function secondary to B/L Knee arthroplasties due to end stage arthritis B/L of knees             -patient may  Shower if knees covered  1/12- will get shower this AM             -ELOS/Goals: 10-14 days mod I 2.  Antithrombotics: -DVT/anticoagulation:  Pharmaceutical: Xarelto--will order dopplers due to elevated D dimer/hypoxia             -antiplatelet therapy: N/a 3. Pain Management: Continue tramadol prn.  Ice prn for local measures. Also gave her 50-100 mg q6 hours prn for tramadol             --Will trial low dose vicodin and monitor for side effects  1/12- tried 1/2 Norco "just now"- no relief so far, but was also taking tramadol prn- con't regimen  1/13- Norco somewhat helpful- cannot tolerate Oxycodone- con't regimen 4. Mood: LCSW to follow for evaluation and support.              -antipsychotic agents: N/A 5. Neuropsych: This patient is capable of making decisions on her own behalf. 6. Skin/Wound Care: Monitor wounds for healing.  1/12- will remove IV- not needed; will remove surgical dressings tomorrow as long as didn't get wet/removed today.    1/13- original dressings off- knees look fantastic- con't daily nonstick dressings.  7. Fluids/Electrolytes/Nutrition: Monitor I/O. Check lytes in am. 8. PAF: Has history of intermittent palpitations (Dr. Harl Bowie) BP/HR tid.  Encourage fluid intake.  9. ABLA: Will recheck CBC in am                         --monitor for signs of bleeding.  10. Glucose intolerance: Recheck FBS in am.  1/12- BG this AM prior to food was 141- will double check to see if have HbA1c- last 1 01/18/20- is 5.1- so will just monitor 11. H/o depression: On Zoloft dialy.  12. Hypoxia: Order flutter valve for pulmonary hygiene             --wean oxygen as able.             --Albuterol MDI prn SOB/wheezing. 13. IBS-diarrhea: Last BM 01/10 Continue colace  for now.  1/13- LBM this AM- doing well 14. Mouth ulcer/sore/lesion  1/12- will start orojel prn for pt.     LOS: 2 days A FACE TO FACE EVALUATION WAS PERFORMED  Melissa Velazquez 02/01/2020, 11:12 AM

## 2020-02-02 ENCOUNTER — Inpatient Hospital Stay (HOSPITAL_COMMUNITY): Payer: Medicare Other

## 2020-02-02 ENCOUNTER — Inpatient Hospital Stay (HOSPITAL_COMMUNITY): Payer: Medicare Other | Admitting: Physical Therapy

## 2020-02-02 NOTE — Progress Notes (Signed)
[place] PHYSICAL MEDICINE & REHABILITATION PROGRESS NOTE   Subjective/Complaints: No complaints Pain is well controlled Moving bowels regularly.   ROS:  Pt denies SOB, abd pain, CP, N/V/C/D, and vision changes   Objective:   VAS [place] LOWER EXTREMITY VENOUS (DVT)  Result Date: 02/02/2020  Lower Venous DVT Study Indications: Post op bilateral knee replacements, rehabilitation.  Comparison Study: No prior Performing Technologist: Sharion Dove RVS  Examination Guidelines: A complete evaluation includes B-mode imaging, spectral Doppler, color Doppler, and power Doppler as needed of all accessible portions of each vessel. Bilateral testing is considered an integral part of a complete examination. Limited examinations for reoccurring indications may be performed as noted. The reflux portion of the exam is performed with the patient in reverse Trendelenburg.  +---------+---------------+---------+-----------+----------+--------------+ RIGHT    CompressibilityPhasicitySpontaneityPropertiesThrombus Aging +---------+---------------+---------+-----------+----------+--------------+ CFV      Full           Yes      Yes                                 +---------+---------------+---------+-----------+----------+--------------+ SFJ      Full                                                        +---------+---------------+---------+-----------+----------+--------------+ FV Prox  Full                                                        +---------+---------------+---------+-----------+----------+--------------+ FV Mid   Full                                                        +---------+---------------+---------+-----------+----------+--------------+ FV DistalFull                                                        +---------+---------------+---------+-----------+----------+--------------+ PFV      Full                                                         +---------+---------------+---------+-----------+----------+--------------+ POP      Full           Yes      Yes                                 +---------+---------------+---------+-----------+----------+--------------+ PTV      Full                                                        +---------+---------------+---------+-----------+----------+--------------+  PERO     Full                                                        +---------+---------------+---------+-----------+----------+--------------+   +---------+---------------+---------+-----------+----------+--------------+ LEFT     CompressibilityPhasicitySpontaneityPropertiesThrombus Aging +---------+---------------+---------+-----------+----------+--------------+ CFV      Full           Yes      Yes                                 +---------+---------------+---------+-----------+----------+--------------+ SFJ      Full                                                        +---------+---------------+---------+-----------+----------+--------------+ FV Prox  Full                                                        +---------+---------------+---------+-----------+----------+--------------+ FV Mid   Full                                                        +---------+---------------+---------+-----------+----------+--------------+ FV DistalFull                                                        +---------+---------------+---------+-----------+----------+--------------+ PFV      Full                                                        +---------+---------------+---------+-----------+----------+--------------+ POP      Full           Yes      Yes                                 +---------+---------------+---------+-----------+----------+--------------+ PTV      Full                                                         +---------+---------------+---------+-----------+----------+--------------+ PERO     Full                                                        +---------+---------------+---------+-----------+----------+--------------+  Summary: BILATERAL: - No evidence of deep vein thrombosis seen in the lower extremities, bilaterally. -   *See table(s) above for measurements and observations. Electronically signed by Jamelle Haring on 02/02/2020 at 3:00:18 PM.    Final    Recent Labs    01/31/20 0752  WBC 7.7  HGB 10.7*  HCT 31.7*  PLT 259   Recent Labs    01/31/20 0752  NA 136  K 3.7  CL 98  CO2 27  GLUCOSE 141*  BUN 13  CREATININE 0.97  CALCIUM 8.3*    Intake/Output Summary (Last 24 hours) at 02/02/2020 1639 Last data filed at 02/02/2020 1200 Gross per 24 hour  Intake 840 ml  Output --  Net 840 ml        Physical Exam: Vital Signs Blood pressure 109/72, pulse 83, temperature 98.6 F (37 C), temperature source Oral, resp. rate 16, height 5\' 4"  (1.626 m), weight 93.7 kg, SpO2 98 %.  Gen: no distress, normal appearing HEENT: oral mucosa pink and moist, NCAT Cardio: Reg rate Chest: normal effort, normal rate of breathing Abd: soft, non-distended Ext: no edema Psych: pleasant, normal affect Musculoskeletal:     Cervical back: Normal range of motion. No rigidity.     Comments: UEs 5/5 in biceps, triceps, WE grip and finger abd LEs- 2/5 in HF B/L; KE/KF NT due to knee surgery, and DF and PF 5/5 B/L B/L knees look great with surgical dressings off- only drainage is some sanguinous trace drainage from distal aspect of L knee- bruising decreasing as well as swelling.  Neurological:  Intact to light touch in all 4 extremities Ox3 Psychiatric: appropriate    Assessment/Plan: 1. Functional deficits which require 3+ hours per day of interdisciplinary therapy in a comprehensive inpatient rehab setting.  Physiatrist is providing close team supervision and 24 hour management of  active medical problems listed below.  Physiatrist and rehab team continue to assess barriers to discharge/monitor patient progress toward functional and medical goals  Care Tool:  Bathing    Body parts bathed by patient: Right arm,Left arm,Chest,Abdomen,Front perineal area,Buttocks,Right upper leg,Left upper leg,Right lower leg,Left lower leg,Face   Body parts bathed by helper: Buttocks,Right lower leg,Left lower leg     Bathing assist Assist Level: Supervision/Verbal cueing     Upper Body Dressing/Undressing Upper body dressing   What is the patient wearing?: Pull over shirt,Bra    Upper body assist Assist Level: Set up assist    Lower Body Dressing/Undressing Lower body dressing      What is the patient wearing?: Underwear/pull up,Pants     Lower body assist Assist for lower body dressing: Supervision/Verbal cueing     Toileting Toileting    Toileting assist Assist for toileting: Contact Guard/Touching assist     Transfers Chair/bed transfer  Transfers assist     Chair/bed transfer assist level: Minimal Assistance - Patient > 75%     Locomotion Ambulation   Ambulation assist      Assist level: Contact Guard/Touching assist Assistive device: Walker-rolling Max distance: 80 ft   Walk 10 feet activity   Assist     Assist level: Contact Guard/Touching assist Assistive device: Walker-rolling   Walk 50 feet activity   Assist    Assist level: Contact Guard/Touching assist Assistive device: Walker-rolling    Walk 150 feet activity   Assist Walk 150 feet activity did not occur: Safety/medical concerns (limited by pain/activity tolerance)    Assistive device: Walker-rolling    Walk 10 feet on  uneven surface  activity   Assist Walk 10 feet on uneven surfaces activity did not occur: Safety/medical concerns (limited by pain/activity tolerance)         Wheelchair     Assist Will patient use wheelchair at discharge?: No (patient  progressing to a functional ambulator) Type of Wheelchair: Manual    Wheelchair assist level: Supervision/Verbal cueing      Wheelchair 50 feet with 2 turns activity    Assist        Assist Level: Supervision/Verbal cueing   Wheelchair 150 feet activity     Assist      Assist Level: Supervision/Verbal cueing   Blood pressure 109/72, pulse 83, temperature 98.6 F (37 C), temperature source Oral, resp. rate 16, height 5\' 4"  (1.626 m), weight 93.7 kg, SpO2 98 %.   Medical Problem List and Plan: 1.  Impaired function secondary to B/L Knee arthroplasties due to end stage arthritis B/L of knees             -patient may  Shower if knees covered  1/12- will get shower this AM             -ELOS/Goals: 10-14 days mod I  -Continue CIR 2.  Antithrombotics: -DVT/anticoagulation:  Pharmaceutical: Xarelto--will order dopplers due to elevated D dimer/hypoxia             -antiplatelet therapy: N/a 3. Pain Management: Continue tramadol prn.  Ice prn for local measures. Also gave her 50-100 mg q6 hours prn for tramadol             --Will trial low dose vicodin and monitor for side effects  1/12- tried 1/2 Norco "just now"- no relief so far, but was also taking tramadol prn- con't regimen  1/13- Norco somewhat helpful- cannot tolerate Oxycodone- con't regimen  1/14: pain is well controlled, continue Norco prn 4. Mood: LCSW to follow for evaluation and support.              -antipsychotic agents: N/A 5. Neuropsych: This patient is capable of making decisions on her own behalf. 6. Skin/Wound Care: Monitor wounds for healing.  1/12- will remove IV- not needed; will remove surgical dressings tomorrow as long as didn't get wet/removed today.    1/13- original dressings off- knees look fantastic- con't daily nonstick dressings.  7. Fluids/Electrolytes/Nutrition: Monitor I/O. Check lytes in am. 8. PAF: Has history of intermittent palpitations (Dr. Harl Bowie) BP/HR tid. Encourage fluid  intake.  9. ABLA: Will recheck CBC in am                         --monitor for signs of bleeding.  10. Glucose intolerance: Recheck FBS in am.  1/12- BG this AM prior to food was 141- will double check to see if have HbA1c- last 1 01/18/20- is 5.1- so will just monitor 11. H/o depression: On Zoloft dialy.  12. Hypoxia: Order flutter valve for pulmonary hygiene             --wean oxygen as able.             --Albuterol MDI prn SOB/wheezing. 13. IBS-diarrhea: Last BM 01/10 Continue colace for now.  1/13- LBM this AM- doing well 14. Mouth ulcer/sore/lesion  1/12- will start orojel prn for pt.     LOS: 3 days A FACE TO FACE EVALUATION WAS PERFORMED  Izora Ribas 02/02/2020, 4:39 PM

## 2020-02-02 NOTE — Progress Notes (Signed)
Inpatient Rehabilitation Care Coordinator Assessment and Plan Patient Details  Name: Melissa Velazquez MRN: [id number] Date of Birth: 04/24/1950  Today's Date: 02/02/2020  Hospital Problems: Principal Problem:   OA (osteoarthritis) of knee  Past Medical History:  Past Medical History:  Diagnosis Date  . Allergic rhinitis   . Anal fissure    Hx of   . Anxiety    hx of  . Arthritis   . Asthma    none in last year   . Chronic kidney disease    partial nephrectomy  . Depression    hx of  . Diabetes mellitus without complication (HCC)    no meds  . Diverticular disease   . Dysrhythmia    palpitations  . Eczema   . Endometrial cancer ([place]) 2001   spread to appendix   . Hemorrhoids   . Hyperglycemia   . Hypertension   . Hypothyroidism   . Kidney cancer, primary, with metastasis from kidney to other site [facility])   . Obesity   . Sebaceous cyst   . Thyroid disease   . Thyroid nodule   . 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   Past Surgical History:  Past Surgical History:  Procedure Laterality Date  . APPENDECTOMY  2008  . BIOPSY  12/14/2016   Procedure: BIOPSY;  Surgeon: Rogene Houston, MD;  [facility];  Service: Endoscopy;;  colon  . CHOLECYSTECTOMY  2008  . COLONOSCOPY N/A 11/24/2012   Procedure: COLONOSCOPY;  Surgeon: Rogene Houston, MD;  [facility];  Service: Endoscopy;  Laterality: N/A;  830  . COLONOSCOPY N/A 12/14/2016   Procedure: COLONOSCOPY;  Surgeon: Rogene Houston, MD;  [facility];  Service: Endoscopy;  Laterality: N/A;  12:00  . POLYPECTOMY  12/14/2016   Procedure: POLYPECTOMY;  Surgeon: Rogene Houston, MD;  Location: [facility];  Service: Endoscopy;;  colon  . ROBOTIC ASSITED PARTIAL NEPHRECTOMY Left 08/21/2016   Procedure: XI ROBOTIC ASSITED PARTIAL NEPHRECTOMY;  Surgeon: Ardis Hughs, MD;  Location: [facility] ORS;  Service: Urology;  Laterality: Left;  . TOTAL ABDOMINAL HYSTERECTOMY  2001  . TOTAL KNEE  ARTHROPLASTY Bilateral 01/24/2020   Procedure: TOTAL KNEE BILATERAL;  Surgeon: Gaynelle Arabian, MD;  Location: [facility];  Service: Orthopedics;  Laterality: Bilateral;  . WISDOM TOOTH EXTRACTION     Social History:  reports that she quit smoking about 22 years ago. Her smoking use included cigarettes. She has a 20.00 pack-year smoking history. She has never used smokeless tobacco. She reports current alcohol use. She reports that she does not use drugs.  Family / Support Systems Marital Status: Single Spouse/Significant Other: N/A Children: No children Other Supports: sister Hoyle Sauer Anticipated Caregiver: sister Hoyle Sauer Ability/Limitations of Caregiver: Intermittent support Caregiver Availability: Intermittent Family Dynamics: Pt lives alone and continues to work in Biomedical engineer.  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 Psychiatric History: See  above Substance Abuse History: Pt admits she quit smoking cigarettees 20 years ago; eoth- rarely  Patient / Family Perceptions, Expectations & Goals Pt/Family understanding of illness & functional limitations: Pt has general understanding of care needs Premorbid pt/family roles/activities: Independent Anticipated  changes in roles/activities/participation: Some assistance with ADLs/IADLs Pt/family expectations/goals: Goal is to work on walking and decreasing pain  Recruitment consultant: None Premorbid Home Care/DME Agencies: None Transportation available at discharge: sister Resource referrals recommended: Neuropsychology  Discharge Planning Living Arrangements: Alone Support Systems: Other relatives Type of Residence: Private residence 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 Pt states her HCPOA is her sister Hoyle Sauer [phone]). Pt not a veteran. DME: RW, and height extender for toilet. SW called pt sister Hoyle Sauer but no answer.    1/14- SW returned phone call to pt sister Hoyle Sauer but unable to leave message as voicemail is full.   Fount Bahe A Teale Goodgame 02/02/2020, 12:08 PM

## 2020-02-02 NOTE — IPOC Note (Signed)
Overall Plan of Care [facility]) Patient Details Name: Melissa Velazquez MRN: [id number] DOB: 05/12/1950  Admitting Diagnosis: OA (osteoarthritis) of knee  Hospital Problems: Principal Problem:   OA (osteoarthritis) of knee     Functional Problem List: Nursing Medication Management,Endurance,Pain,Safety  PT Balance,Pain,Skin Integrity,Nutrition,Edema,Endurance  OT Balance,Endurance,Edema,Pain,Safety  SLP    TR         Basic ADL's: OT Grooming,Bathing,Toileting,Dressing     Advanced  ADL's: OT       Transfers: PT Bed Mobility,Bed to Chair,Car,Furniture  OT Toilet,Tub/Shower     Locomotion: PT Ambulation,Wheelchair Mobility,Stairs     Additional Impairments: OT    SLP        TR      Anticipated Outcomes Item Anticipated Outcome  Self Feeding MOD I  Swallowing      Basic self-care  MOD I  Toileting  MOD I   Bathroom Transfers MOD I  Bowel/Bladder  n/a  Transfers  mod I using LRAD  Locomotion  mod I household distances, supervision community distances using LRAD  Communication     Cognition     Pain  pain at or below level 4  Safety/Judgment  maintain safety with cues/reminders   Therapy Plan: PT Intensity: Minimum of 1-2 x/day ,45 to 90 minutes PT Frequency: 5 out of 7 days PT Duration Estimated Length of Stay: 7-10 days OT Intensity: Minimum of 1-2 x/day, 45 to 90 minutes OT Frequency: 5 out of 7 days OT Duration/Estimated Length of Stay: 7-10     Due to the current state of emergency, patients may not be receiving their 3-hours of Medicare-mandated therapy.   Team Interventions: Nursing Interventions Patient/Family Education,Pain Theme park manager Planning  PT interventions Ambulation/gait training,Discharge planning,DME/adaptive equipment instruction,Functional mobility training,Pain management,Psychosocial support,Splinting/orthotics,Therapeutic Activities,UE/LE Strength taining/ROM,Balance/vestibular training,Community  reintegration,Disease management/prevention,Functional electrical stimulation,Neuromuscular re-education,Patient/family education,Skin care/wound management,Stair training,Therapeutic Exercise,UE/LE Museum/gallery conservator propulsion/positioning  OT Interventions Balance/vestibular training,Discharge planning,Pain management,Self Care/advanced ADL retraining,Therapeutic Activities,UE/LE Coordination activities,Therapeutic Exercise,Skin care/wound managment,Patient/family education,Functional mobility training,Disease Programmer, applications re-education,Psychosocial support,UE/LE Strength taining/ROM,Wheelchair propulsion/positioning  SLP Interventions    TR Interventions    SW/CM Interventions Discharge Planning,Psychosocial Support,Patient/Family Education   Barriers to Discharge MD  Medical stability  Nursing Weight bearing restrictions bil total knee  PT Home environment access/layout,Lack of/limited family support,Decreased caregiver support has 4 STE with wide rails, plans to d/c home alone with limited PRN assist  OT Decreased caregiver support,Inaccessible home environment    SLP      SW Decreased caregiver support,Lack of/limited family support     Team Discharge Planning: Destination: PT-Home ,OT- Home , SLP-  Projected Follow-up: PT-Outpatient PT, OT-  Home health OT, SLP-  Projected Equipment Needs: PT-Rolling walker with 5" wheels,To be determined, OT- 3 in 1 bedside comode, SLP-  Equipment Details: PT- , OT-sister ordering TTB Patient/family involved in discharge planning: PT- Patient,  OT-Patient, SLP-   MD ELOS: 7-10 days Medical Rehab Prognosis:  Excellent Assessment: The patient has been admitted for CIR therapies with the diagnosis of OA bilateral knees s/p bilateral TKA's. The team will be addressing functional mobility, strength, stamina, balance, safety, adaptive techniques and  equipment, self-care, bowel and bladder mgt, patient and caregiver education, pain mgt, knee rom, LE strengthening, community reentry. Goals have been set at mod I for mobility and self-care.   Due to the current state of emergency, patients may not be receiving their 3 hours per day of Medicare-mandated therapy.    Meredith Staggers, MD, [facility]      See Team  Conference Notes for weekly updates to the plan of care

## 2020-02-02 NOTE — Plan of Care (Signed)
  Problem: Consults Goal: RH GENERAL PATIENT EDUCATION Description: See Patient Education module for education specifics. Outcome: Progressing   Problem: RH SKIN INTEGRITY Goal: RH STG ABLE TO PERFORM INCISION/WOUND CARE W/ASSISTANCE Description: STG Able To Perform Incision/Wound Care With min Assistance. Outcome: Progressing   Problem: RH SAFETY Goal: RH STG ADHERE TO SAFETY PRECAUTIONS W/ASSISTANCE/DEVICE Description: STG Adhere to Safety Precautions With  cues/reminders Assistance/Device. Outcome: Progressing   Problem: RH PAIN MANAGEMENT Goal: RH STG PAIN MANAGED AT OR BELOW PT'S PAIN GOAL Description: Pain at or below level 4  Outcome: Progressing   Problem: RH KNOWLEDGE DEFICIT GENERAL Goal: RH STG INCREASE KNOWLEDGE OF SELF CARE AFTER HOSPITALIZATION Description: Patient will be able to manage care at discharge using handouts and education independently  Outcome: Progressing

## 2020-02-02 NOTE — Progress Notes (Signed)
Physical Therapy Session Note  Patient Details  Name: Melissa Velazquez MRN: [id number] Date of Birth: Sep 06, 1950  Today's Date: 02/02/2020 PT Individual Time: 1130-1200 PT Individual Time Calculation (min): 30 min   Short Term Goals: Week 1:  PT Short Term Goal 1 (Week 1): STG=LTG due to short ELOS.  Skilled Therapeutic Interventions/Progress Updates:    Patient in supine and reports had called for assistance to bathroom.  Supine to sit with S.  Assisted to don shoes in sitting.  Patient sit to stand min A and ambulated to bathroom with RW and S/CGA.  Toileted with min A for transfer and stability while pulling up her pants.  Patient ambulated to dayroom x 100' with RW and performed 5 minutes on Nu Step at level 2, UE/LE seat at 9 and handles at 11.  Patient ambulated to mat x 20' with RW and close S.  Performed heel slides and measured knee AROM 80 on R and 85 on L.  Patient sit to supine min to mod A for LE's.  Performed SLR x 10 with assist and cues for knee control.  Quad sets with towel rolls under heels with hip adduction x 10 w/ 5 sec hold.  Supine to sit with min A.  Patient ambulated back to room with RW and close S.  Sit to supine with min A.  Left with all needs in reach and bed alarm active.   Therapy Documentation Precautions:  Precautions Precautions: Fall,Knee Precaution Comments: instructed no pillows under knee Restrictions Weight Bearing Restrictions: No RLE Weight Bearing: Weight bearing as tolerated LLE Weight Bearing: Weight bearing as tolerated Other Position/Activity Restrictions: WBAT Pain: Pain Assessment Pain Score: 5  Pain Location: Knee Pain Orientation: Right;Left Pain Descriptors / Indicators: Aching Pain Onset: With Activity Pain Intervention(s): Repositioned    Therapy/Group: Individual Therapy  Reginia Naas  Magda Kiel, PT 02/02/2020, 12:54 PM

## 2020-02-02 NOTE — Progress Notes (Signed)
Occupational Therapy Session Note  Patient Details  Name: HERMA UBALLE MRN: [id number] Date of Birth: 1950-02-26  Today's Date: 02/02/2020 OT Individual Time: 8416-6063 OT Individual Time Calculation (min): 55 min    Short Term Goals: Week 1:  OT Short Term Goal 1 (Week 1): STG=LTG d/t ELOS  Skilled Therapeutic Interventions/Progress Updates:    Pt resting in bed upon arrival and requesting to take a shower. Pt gathered supplies and amb with RW to bathroom. Bathing/dressing with sit<>stand from TTB in shower. Pt returned to room and donned socks/shoes before amb to sink and completed grooming while standing at sink. Pt completed all tasks with supervision. Pt commented that it is surprising how a shower can be exhausting. Pt returned to bed and remained in bed with all needs within reach. Bed alarm activated.   Therapy Documentation Precautions:  Precautions Precautions: Fall,Knee Precaution Comments: instructed no pillows under knee Restrictions Weight Bearing Restrictions: No RLE Weight Bearing: Weight bearing as tolerated LLE Weight Bearing: Weight bearing as tolerated Other Position/Activity Restrictions: WBAT General:   Vital Signs:   Pain:   ADL: ADL Eating: Set up Where Assessed-Eating: Bed level Grooming: Setup Where Assessed-Grooming: Edge of bed Upper Body Bathing: Supervision/safety Where Assessed-Upper Body Bathing: Shower Lower Body Bathing: Moderate assistance Where Assessed-Lower Body Bathing: Shower Upper Body Dressing: Setup Where Assessed-Upper Body Dressing: Chair Lower Body Dressing: Maximal assistance Where Assessed-Lower Body Dressing: Chair Toileting: Minimal assistance Toilet Transfer: Minimal assistance Toilet Transfer Method: Ambulating Vision   Perception    Praxis   Exercises:   Other Treatments:     Therapy/Group: Individual Therapy  Leroy Libman 02/02/2020, 9:58 AM

## 2020-02-02 NOTE — Care Management (Signed)
[place] Individual Statement of Services  Patient Name:  Melissa Velazquez  Date:  02/02/2020  Welcome to the [facility].  Our goal is to provide you with an individualized program based on your diagnosis and situation, designed to meet your specific needs.  With this comprehensive rehabilitation program, you will be expected to participate in at least 3 hours of rehabilitation therapies Monday-Friday, with modified therapy programming on the weekends.  Your rehabilitation program will include the following services:  Physical Therapy (PT), Occupational Therapy (OT), 24 hour per day rehabilitation nursing, Therapeutic Recreaction (TR), Psychology, Neuropsychology, Care Coordinator, Rehabilitation Medicine, Nutrition Services, Pharmacy Services and Other  Weekly team conferences will be held on Tuesdays to discuss your progress.  Your Inpatient Rehabilitation Care Coordinator will talk with you frequently to get your input and to update you on team discussions.  Team conferences with you and your family in attendance may also be held.  Expected length of stay: 7-10 days   Overall anticipated outcome: Independent with an Assistive Device  Depending on your progress and recovery, your program may change. Your Inpatient Rehabilitation Care Coordinator will coordinate services and will keep you informed of any changes. Your Inpatient Rehabilitation Care Coordinator's name and contact numbers are listed  below.  The following services may also be recommended but are not provided by the [facility] will be made to provide these services after discharge if needed.  Arrangements include referral to agencies that provide these services.  Your insurance has been verified to be:  Medicare A/B  Your  primary doctor is:  [name]  Pertinent information will be shared with your doctor and your insurance company.  Inpatient Rehabilitation Care Coordinator:  Cathleen Corti [phone] or ([phone]  Information discussed with and copy given to patient by: Rana Snare, 02/02/2020, 12:01 PM

## 2020-02-02 NOTE — Progress Notes (Signed)
Occupational Therapy Session Note  Patient Details  Name: SOFI BRYARS MRN: [id number] Date of Birth: Aug 07, 1950  Today's Date: 02/02/2020 OT Individual Time: 1300-1330 OT Individual Time Calculation (min): 30 min    Short Term Goals: Week 1:  OT Short Term Goal 1 (Week 1): STG=LTG d/t ELOS  Skilled Therapeutic Interventions/Progress Updates:    Pt resting in bed upon arrival. OT intervention with focus on kitchen safety, planning laundry at home and recommendations, housekeeping activities, home safety, and general safety awareness. Pt will have sister for a couple of weeks but will then be alone. Pt inquired about walking her 20# dog. Recommended to have sister and/or neighbor walk dog. Pt returned to room and remained in w/c while NT changed linen. Ice applied to B/L knees. NT present in room.  Therapy Documentation Precautions:  Precautions Precautions: Fall,Knee Precaution Comments: instructed no pillows under knee Restrictions Weight Bearing Restrictions: No RLE Weight Bearing: Weight bearing as tolerated LLE Weight Bearing: Weight bearing as tolerated Other Position/Activity Restrictions: WBAT Pain: Pain Assessment Pain Score: 5  Pain Location: Knee Pain Orientation: Right;Left Pain Descriptors / Indicators: Aching Pain Onset: With Activity Pain Intervention(s): Repositioned, ice   Therapy/Group: Individual Therapy  Leroy Libman 02/02/2020, 2:43 PM

## 2020-02-02 NOTE — Progress Notes (Signed)
Physical Therapy Session Note  Patient Details  Name: Melissa Velazquez MRN: [id number] Date of Birth: 16-Jan-1951  Today's Date: 02/02/2020 PT Individual Time: 1130-1200 PT Individual Time Calculation (min): 30 min   Short Term Goals: Week 1:  PT Short Term Goal 1 (Week 1): STG=LTG due to short ELOS.  Skilled Therapeutic Interventions/Progress Updates: Pt presented in bed agreeable to therapy. Pt c/o pain 6/10 but recently medicated and currently with ice packs on B knees. PTA assisted with removing ice packs and pt performed supine to sit EOB with supervision and use of bed features. Pt donned shoes with supervision and ambulated to bathroom CGA. Performed toilet transfers with CGA and increased time from standard toilet (+ urinary void). Pt then ambulated to sink to perform hand hygiene. Pt ambulated ~235f with RW and CGA with pt demonstrating improved B knee flexion with heel strike and toe off as compared to yesterday's session. Participated in knee flexion stretch at stairs 30sec x 3 bilaterally then ambulated to high/low mat. Pt participated in heel slides, SAQ, and QS with 3 sec hold x 10 bilaterally. PTA performed gentle grade I patella mobs to L knee due to pt c/o increased pain in L knee with SAQ. Pt also performed hamstring pulls x 10 bilaterally with level 1 resistance band. Performed ambulatory transfer to w/c with CGA and RW and pt transported back to room for time management. Performed ambulatory transfer to bed and returned to supine with supervision and bed features. Pt left in bed at end of session with bed alarm on, call bell within reach and needs met.       Therapy Documentation Precautions:  Precautions Precautions: Fall,Knee Precaution Comments: instructed no pillows under knee Restrictions Weight Bearing Restrictions: No RLE Weight Bearing: Weight bearing as tolerated LLE Weight Bearing: Weight bearing as tolerated Other Position/Activity Restrictions: WBAT General:    Vital Signs: Therapy Vitals Temp: 98.6 F (37 C) Temp Source: Oral Pulse Rate: 83 Resp: 16 BP: 109/72 Patient Position (if appropriate): Lying Oxygen Therapy SpO2: 98 % O2 Device: Room Air Pain: Pain Assessment Pain Score: 5  Pain Location: Knee Pain Orientation: Right;Left Pain Descriptors / Indicators: Aching Pain Onset: With Activity Pain Intervention(s): Repositioned    Therapy/Group: Individual Therapy  Lynore Coscia  Caelum Federici, PTA  02/02/2020, 4:16 PM

## 2020-02-03 ENCOUNTER — Inpatient Hospital Stay (HOSPITAL_COMMUNITY): Payer: Medicare Other

## 2020-02-03 ENCOUNTER — Inpatient Hospital Stay (HOSPITAL_COMMUNITY): Payer: Medicare Other | Admitting: Physical Therapy

## 2020-02-03 NOTE — Progress Notes (Signed)
Occupational Therapy Session Note  Patient Details  Name: Melissa Velazquez MRN: [id number] Date of Birth: 05-Nov-1950  Today's Date: 02/03/2020 OT Individual Time: 1345-1430 OT Individual Time Calculation (min): 45 min    Short Term Goals: Week 1:  OT Short Term Goal 1 (Week 1): STG=LTG d/t ELOS  Skilled Therapeutic Interventions/Progress Updates:    1:1 pt received in w/c with RN and sister present. Pt reporting need to go to bathroom. Pt completes ambulatory transfer to [facility] over toilet with S and completes 3/3 components of toileting with S overall. Pt returns to w/c and OT transports pt to laundry room. Pt discusses laundry set up and adaptations to transportation of clothing and positioning of items in laundry room. Pt completes loading/unloading washer/dryer and folding with set up. Edu re use of reacher PRN for getting clothing out of dryer. Pt completes functional mobility >300 feet around unity with RW for activiyt tolernace. Exited session with pt seated in bed, exit alarm on and call light in reach   Therapy Documentation Precautions:  Precautions Precautions: Fall,Knee Precaution Comments: instructed no pillows under knee Restrictions Weight Bearing Restrictions: No RLE Weight Bearing: Weight bearing as tolerated LLE Weight Bearing: Weight bearing as tolerated Other Position/Activity Restrictions: WBAT General:   Vital Signs:   Pain: Pain Assessment Pain Scale: 0-10 Pain Score: 5  Pain Type: Acute pain Pain Location: Knee Pain Orientation: Right;Left Pain Descriptors / Indicators: Aching Pain Frequency: Intermittent Pain Onset: On-going Pain Intervention(s): Medication (See eMAR) ADL: ADL Eating: Set up Where Assessed-Eating: Bed level Grooming: Setup Where Assessed-Grooming: Edge of bed Upper Body Bathing: Supervision/safety Where Assessed-Upper Body Bathing: Shower Lower Body Bathing: Moderate assistance Where Assessed-Lower Body Bathing: Shower Upper Body  Dressing: Setup Where Assessed-Upper Body Dressing: Chair Lower Body Dressing: Maximal assistance Where Assessed-Lower Body Dressing: Chair Toileting: Minimal assistance Toilet Transfer: Minimal assistance Toilet Transfer Method: Ambulating Vision   Perception    Praxis   Exercises:   Other Treatments:     Therapy/Group: Individual Therapy  Tonny Branch 02/03/2020, 12:56 PM

## 2020-02-03 NOTE — Progress Notes (Signed)
[facility] PHYSICAL MEDICINE & REHABILITATION PROGRESS NOTE   Subjective/Complaints: Complains of bilateral knee pain this morning She has been trying to use Norco sparingly- used twice today. Counseled on using 30 minutes prior to therapy. LFTs reviewed and elevated so Tylenol discontinued. CMP ordered for Monday  ROS:  Pt denies SOB, abd pain, CP, N/V/C/D, and vision changes   Objective:   No results found. No results for input(s): WBC, HGB, HCT, PLT in the last 72 hours. No results for input(s): NA, K, CL, CO2, GLUCOSE, BUN, CREATININE, CALCIUM in the last 72 hours.  Intake/Output Summary (Last 24 hours) at 02/03/2020 1946 Last data filed at 02/03/2020 1820 Gross per 24 hour  Intake 600 ml  Output --  Net 600 ml        Physical Exam: Vital Signs Blood pressure 130/70, pulse 81, temperature 98.8 F (37.1 C), resp. rate 16, height 5\' 4"  (1.626 m), weight 93.7 kg, SpO2 99 %. Gen: no distress, normal appearing HEENT: oral mucosa pink and moist, NCAT Cardio: Reg rate Chest: normal effort, normal rate of breathing Abd: soft, non-distended Ext: no edema Psych: pleasant, normal affect Skin: intact Musculoskeletal:     Cervical back: Normal range of motion. No rigidity.     Comments: UEs 5/5 in biceps, triceps, WE grip and finger abd LEs- 2/5 in HF B/L; KE/KF NT due to knee surgery, and DF and PF 5/5 B/L B/L knees look great with surgical dressings off- only drainage is some sanguinous trace drainage from distal aspect of L knee- bruising decreasing as well as swelling.  Neurological:  Intact to light touch in all 4 extremities Ox3 Psychiatric: appropriate    Assessment/Plan: 1. Functional deficits which require 3+ hours per day of interdisciplinary therapy in a comprehensive inpatient rehab setting.  Physiatrist is providing close team supervision and 24 hour management of active medical problems listed below.  Physiatrist and rehab team continue to assess barriers  to discharge/monitor patient progress toward functional and medical goals  Care Tool:  Bathing    Body parts bathed by patient: Right arm,Left arm,Chest,Abdomen,Front perineal area,Buttocks,Right upper leg,Left upper leg,Right lower leg,Left lower leg,Face   Body parts bathed by helper: Buttocks,Right lower leg,Left lower leg     Bathing assist Assist Level: Supervision/Verbal cueing     Upper Body Dressing/Undressing Upper body dressing   What is the patient wearing?: Pull over shirt,Bra    Upper body assist Assist Level: Set up assist    Lower Body Dressing/Undressing Lower body dressing      What is the patient wearing?: Underwear/pull up,Pants     Lower body assist Assist for lower body dressing: Supervision/Verbal cueing     Toileting Toileting    Toileting assist Assist for toileting: Contact Guard/Touching assist     Transfers Chair/bed transfer  Transfers assist     Chair/bed transfer assist level: Contact Guard/Touching assist Chair/bed transfer assistive device: Programmer, multimedia   Ambulation assist      Assist level: Contact Guard/Touching assist Assistive device: Walker-rolling Max distance: 195ft   Walk 10 feet activity   Assist     Assist level: Contact Guard/Touching assist Assistive device: Walker-rolling   Walk 50 feet activity   Assist    Assist level: Contact Guard/Touching assist Assistive device: Walker-rolling    Walk 150 feet activity   Assist Walk 150 feet activity did not occur: Safety/medical concerns (limited by pain/activity tolerance)  Assist level: Contact Guard/Touching assist Assistive device: Walker-rolling    Walk  10 feet on uneven surface  activity   Assist Walk 10 feet on uneven surfaces activity did not occur: Safety/medical concerns (limited by pain/activity tolerance)         Wheelchair     Assist Will patient use wheelchair at discharge?: No (patient progressing to a  functional ambulator) Type of Wheelchair: Manual    Wheelchair assist level: Supervision/Verbal cueing      Wheelchair 50 feet with 2 turns activity    Assist        Assist Level: Supervision/Verbal cueing   Wheelchair 150 feet activity     Assist      Assist Level: Supervision/Verbal cueing   Blood pressure 130/70, pulse 81, temperature 98.8 F (37.1 C), resp. rate 16, height 5\' 4"  (1.626 m), weight 93.7 kg, SpO2 99 %.   Medical Problem List and Plan: 1.  Impaired function secondary to B/L Knee arthroplasties due to end stage arthritis B/L of knees             -patient may  Shower if knees covered  1/12- will get shower this AM             -ELOS/Goals: 10-14 days mod I  -Continue CIR 2.  Antithrombotics: -DVT/anticoagulation:  Pharmaceutical: Xarelto--will order dopplers due to elevated D dimer/hypoxia             -antiplatelet therapy: N/a 3. Pain Management: Continue tramadol prn.  Ice prn for local measures. Also gave her 50-100 mg q6 hours prn for tramadol             --Will trial low dose vicodin and monitor for side effects  1/12- tried 1/2 Norco "just now"- no relief so far, but was also taking tramadol prn- con't regimen  1/13- Norco somewhat helpful- cannot tolerate Oxycodone- con't regimen  1/14: pain is well controlled, continue Norco prn  1/15: counseled regarding using Norco 30 minutes prior to therapy session to maximize participation. Continue icing.  4. Mood: LCSW to follow for evaluation and support.              -antipsychotic agents: N/A 5. Neuropsych: This patient is capable of making decisions on her own behalf. 6. Skin/Wound Care: Monitor wounds for healing.  1/12- will remove IV- not needed; will remove surgical dressings tomorrow as long as didn't get wet/removed today.    1/13- original dressings off- knees look fantastic- con't daily nonstick dressings.  7. Fluids/Electrolytes/Nutrition: Monitor I/O. Check lytes in am. 8. PAF: Has  history of intermittent palpitations (Dr. Harl Bowie) BP/HR tid. Encourage fluid intake.  9. ABLA: Hgb 10.7 on 1/12- monitor weekly                         --monitor for signs of bleeding.  10. Glucose intolerance: Recheck FBS in am.  1/12- BG this AM prior to food was 141- will double check to see if have HbA1c- last 1 01/18/20- is 5.1- so will just monitor 11. H/o depression: On Zoloft dialy.  12. Hypoxia: Order flutter valve for pulmonary hygiene             --wean oxygen as able.             --Albuterol MDI prn SOB/wheezing. 13. IBS-diarrhea: Last BM 01/10 Continue colace for now.  1/13- LBM this AM- doing well 14. Mouth ulcer/sore/lesion  1/12- will start orojel prn for pt.  15. Elevated LFTs: tylenol discontinued.     LOS: 4 days  A FACE TO FACE EVALUATION WAS PERFORMED  Izora Ribas 02/03/2020, 7:46 PM

## 2020-02-03 NOTE — Progress Notes (Signed)
Occupational Therapy Session Note  Patient Details  Name: Melissa Velazquez MRN: [id number] Date of Birth: 1950-03-28  Today's Date: 02/03/2020 OT Individual Time: 6629-4765 OT Individual Time Calculation (min): 56 min    Short Term Goals: Week 1:  OT Short Term Goal 1 (Week 1): STG=LTG d/t ELOS  Skilled Therapeutic Interventions/Progress Updates:    1:1. P trecieved in bed agreeable to Ot and bathing. Bathing completed in tub room to simulate home set up and all BADl tasks completed wit S at sit to stand level except bathing which lateral leans were utilized to wash buttocks. Pt requires CGA for exiting tup bench as pt pushes up wit BUE on RW and RW tips back with minor LOB posteriorly. Continued education on hand placement provided. Ot able to lean anteriorly to access feet but also uses reacher with supervision to doff socks (VC for pushing sock past heel)Grooming completed with set up in room. Exited session with pt seated in w/c, exit alarm on and call light in reach   Therapy Documentation Precautions:  Precautions Precautions: Fall,Knee Precaution Comments: instructed no pillows under knee Restrictions Weight Bearing Restrictions: No RLE Weight Bearing: Weight bearing as tolerated LLE Weight Bearing: Weight bearing as tolerated Other Position/Activity Restrictions: WBAT General:   Vital Signs: Therapy Vitals Temp: 97.7 F (36.5 C) Temp Source: Oral Pulse Rate: 73 Resp: 16 BP: 136/69 Patient Position (if appropriate): Lying Oxygen Therapy SpO2: 93 % O2 Device: Room Air Pain: Pain Assessment Pain Scale: 0-10 Pain Score: 2  Pain Location: Knee Pain Orientation: Right;Left ADL: ADL Eating: Set up Where Assessed-Eating: Bed level Grooming: Setup Where Assessed-Grooming: Edge of bed Upper Body Bathing: Supervision/safety Where Assessed-Upper Body Bathing: Shower Lower Body Bathing: Moderate assistance Where Assessed-Lower Body Bathing: Shower Upper Body Dressing:  Setup Where Assessed-Upper Body Dressing: Chair Lower Body Dressing: Maximal assistance Where Assessed-Lower Body Dressing: Chair Toileting: Minimal assistance Toilet Transfer: Minimal assistance Toilet Transfer Method: Ambulating Vision   Perception    Praxis   Exercises:   Other Treatments:     Therapy/Group: Individual Cathey Endow Meya Clutter 02/03/2020, 6:43 AM

## 2020-02-03 NOTE — Progress Notes (Signed)
Physical Therapy Session Note  Patient Details  Name: Melissa Velazquez MRN: [id number] Date of Birth: October 07, 1950  Today's Date: 02/03/2020 PT Individual Time: 5643-3295 and 1455-1549 PT Individual Time Calculation (min): 67 min and 54 min   Short Term Goals: Week 1:  PT Short Term Goal 1 (Week 1): STG=LTG due to short ELOS.  Skilled Therapeutic Interventions/Progress Updates:    Session 1: Pt received long sitting in bed and agreeable to therapy session. Turning to sit EOB using UEs to assist with B LE management and supervision for safety. Sitting EOB donned shoes with set-up assist. Sit<>stands to/from RW with CGA/min assist for lifting as pt demos delayed trunk/hip extension for upright posture. Gait training ~120ft to main therapy gym using RW with CGA for safety - demos decreased L LE hip/knee flexion during swing compared to R with increased  BUE support on RW though progressing towards more normalized gait mechanics as knee ROM improves. Sit>supine on mat with supervision and again pt using UEs to assist with LE management onto mat as needed.  Performed the following supine B LE exercises:  - quad sets 5sec hold x10 reps - straight leg raise 2x10reps each LE - cuing for sustained quad activation throughout range - L LE heel slides providing pt gait belt strap to allow pt to assist with increased knee flexion ROM with pt only able to tolerate 3 reps and then becoming tearful stating the pain is "excrutiating" - provided prolonged rest, relaxation and emotional support - pt becomes upset that she is unable to tolerate more, therapist provided education on her current ROM and typical post-op TKA protocol and ROM progressions with encouragement on performing more exercises on her own outside of therapy to allow fewer reps but increased sets performed more frequently during the day - also discussed pt's current pain location along hamstring and gastroc insertions behind knee with plan to perform  soft tissue mobilizations in afternoon session - therapist discussed having MD order Ice Man Machine to provide more circumferential ice application due to bruising and edema noted around posterior knee and hamstrings  - R LE heel slides using gait belt strap x5 reps with pt able to tolerate more reps on this side but still limited by pain to only 5  Supine>sitting EOM with supervision using UEs to assist with B LEs. Sitting EOM performed seated heel slides to pt's tolerance x5 reps each with 5 sec hold - education on monitoring hip elevation during this exercise to decrease compensation. Stand pivot EOM>w/c using RW with min assist as pt demos further delayed trunk/hip extension to come to upright standing now that she is experiencing increased knee pain. Provided pt with below exercises to perform outside of therapy sessions.  Access Code: [email or web address] URL: [email or web address] Date: 02/03/2020 Prepared by: Page Spiro  Exercises Supine Heel Slide with Strap - 1 x daily - 7 x weekly - 2 sets - 5 reps Seated Heel Slide - 1 x daily - 7 x weekly - 2 sets - 8 reps Supine Straight Leg Raises - 1 x daily - 7 x weekly - 2 sets - 10 reps  Transported back to room and pt educated on importance of sitting up in w/c some during the day to allow long duration-low load stretch into knee flexion with education on how to use elevating leg rests to readjust into more extended position if knee pain increases. Therapist applied ice to bilateral knees. Pt left sitting in w/c with needs in reach and seat  belt alarm on.   Session 2: Pt received sitting on EOB and agreeable to therapy session. Reports recent pain medication administration. Sit>stands EOB or w/c>RW with CGA for steadying - pt demos improved trunk/hip extension to achieve upright this session. Gait training ~130ft x2 to/from therapy gym using RW with CGA for steadying/safety - continues to demo improving gait mechanics with increased  symmetry and increased hip/knee flexion during swing - intermittent cuing for improvement on L LE. Stepped up/down on/off 1st 6" height step using B HRs with CGA/min assist for balance - cuing for increased hip/knee flexion when placing foot on/off step and increased glute and quad activation for extension when stepping up x8 reps. Attempted same task on L LE with pt unable to achieve enough muscle force in quads and glutes to step up without min assist for lifting and reports increased knee pain therefore transitioned to performing it on 3" height step x8 reps. Discussed home set-up with pt reporting STE with B HRs but they are too wide to reach simultaneously therefore educated pt on side-stepping technique using L HR stepping up with R LE - performed 3steps x1 with CGA/min assist for steadying. Gait back to room as above. Sitting on EOB doffed long pants and changed into Capris with set-up assist and increased time - sit<>stand using RW with CGA and pulled pants over hips without assist. Positioned into R sidelying and therapist performed soft tissue mobilization with effleurage for edema management on L LE as pt reports primary location of pain around L knee during flexion is near lateral gastroc head and noted to have edema and bruising in that area x10 minutes. Educated pt on ensuring she performs active ankle PF/DF pumps to assist with edema management. Positioned pt in CPM machine on L LE with 0 degree knee extension and 85degrees knee flexion - tolerated for 5 repetitions prior to therapist exiting and pt shown how to stop machine if needed. Pt left supine in bed with needs in reach and bed alarm on.   Therapy Documentation Precautions:  Precautions Precautions: Fall,Knee Precaution Comments: instructed no pillows under knee Restrictions Weight Bearing Restrictions: No RLE Weight Bearing: Weight bearing as tolerated LLE Weight Bearing: Weight bearing as tolerated Other Position/Activity  Restrictions: WBAT  Pain: Session 1: Reports pain is "excrutiating" when performing supine heel slides - additional details above - educated pt on scheduling pain medications around therapy sessions and continued use of ice.  Session 2: Reports primary pain in L knee upon attempts at stepping up onto 6" step otherwise has discomfort/sorness but tolerable with pt being premedicated.  Therapy/Group: Individual Therapy  Tawana Scale , PT, DPT, CSRS  02/03/2020, 8:00 AM

## 2020-02-03 NOTE — Plan of Care (Signed)
  Problem: Consults Goal: RH GENERAL PATIENT EDUCATION Description: See Patient Education module for education specifics. Outcome: Progressing   Problem: RH SKIN INTEGRITY Goal: RH STG ABLE TO PERFORM INCISION/WOUND CARE W/ASSISTANCE Description: STG Able To Perform Incision/Wound Care With min Assistance. Outcome: Progressing   Problem: RH SAFETY Goal: RH STG ADHERE TO SAFETY PRECAUTIONS W/ASSISTANCE/DEVICE Description: STG Adhere to Safety Precautions With  cues/reminders Assistance/Device. Outcome: Progressing   Problem: RH PAIN MANAGEMENT Goal: RH STG PAIN MANAGED AT OR BELOW PT'S PAIN GOAL Description: Pain at or below level 4  Outcome: Progressing   Problem: RH KNOWLEDGE DEFICIT GENERAL Goal: RH STG INCREASE KNOWLEDGE OF SELF CARE AFTER HOSPITALIZATION Description: Patient will be able to manage care at discharge using handouts and education independently  Outcome: Progressing   

## 2020-02-04 NOTE — Progress Notes (Signed)
[facility] PHYSICAL MEDICINE & REHABILITATION PROGRESS NOTE   Subjective/Complaints: Discussed with Mrs. Yoshino that her LFTs are elevated so we cannot schedule Tylenol as I suggested yesterday. She has been icing which helps a lot. I have emailed Anderson Malta to see if we can get her an Ice Man.  ROS:  Pt denies SOB, abd pain, CP, N/V/C/D, and vision changes, +bilateral knee pain   Objective:   No results found. No results for input(s): WBC, HGB, HCT, PLT in the last 72 hours. No results for input(s): NA, K, CL, CO2, GLUCOSE, BUN, CREATININE, CALCIUM in the last 72 hours.  Intake/Output Summary (Last 24 hours) at 02/04/2020 1639 Last data filed at 02/04/2020 1304 Gross per 24 hour  Intake 600 ml  Output --  Net 600 ml        Physical Exam: Vital Signs Blood pressure (!) 110/59, pulse 78, temperature 98.4 F (36.9 C), temperature source Oral, resp. rate 18, height 5\' 4"  (1.626 m), weight 93.7 kg, SpO2 100 %. Gen: no distress, normal appearing HEENT: oral mucosa pink and moist, NCAT Cardio: Reg rate Chest: normal effort, normal rate of breathing Abd: soft, non-distended Ext: no edema Psych: pleasant, normal affect Musculoskeletal:     Cervical back: Normal range of motion. No rigidity.     Comments: UEs 5/5 in biceps, triceps, WE grip and finger abd LEs- 2/5 in HF B/L; KE/KF NT due to knee surgery, and DF and PF 5/5 B/L B/L knees look great with surgical dressings off- only drainage is some sanguinous trace drainage from distal aspect of L knee- bruising decreasing as well as swelling.  Neurological:  Intact to light touch in all 4 extremities AOx3 Psychiatric: appropriate  Assessment/Plan: 1. Functional deficits which require 3+ hours per day of interdisciplinary therapy in a comprehensive inpatient rehab setting.  Physiatrist is providing close team supervision and 24 hour management of active medical problems listed below.  Physiatrist and rehab team continue to  assess barriers to discharge/monitor patient progress toward functional and medical goals  Care Tool:  Bathing    Body parts bathed by patient: Right arm,Left arm,Chest,Abdomen,Front perineal area,Buttocks,Right upper leg,Left upper leg,Right lower leg,Left lower leg,Face   Body parts bathed by helper: Buttocks,Right lower leg,Left lower leg     Bathing assist Assist Level: Supervision/Verbal cueing     Upper Body Dressing/Undressing Upper body dressing   What is the patient wearing?: Pull over shirt,Bra    Upper body assist Assist Level: Set up assist    Lower Body Dressing/Undressing Lower body dressing      What is the patient wearing?: Underwear/pull up,Pants     Lower body assist Assist for lower body dressing: Supervision/Verbal cueing     Toileting Toileting    Toileting assist Assist for toileting: Contact Guard/Touching assist     Transfers Chair/bed transfer  Transfers assist     Chair/bed transfer assist level: Contact Guard/Touching assist Chair/bed transfer assistive device: Programmer, multimedia   Ambulation assist      Assist level: Contact Guard/Touching assist Assistive device: Walker-rolling Max distance: 133ft   Walk 10 feet activity   Assist     Assist level: Contact Guard/Touching assist Assistive device: Walker-rolling   Walk 50 feet activity   Assist    Assist level: Contact Guard/Touching assist Assistive device: Walker-rolling    Walk 150 feet activity   Assist Walk 150 feet activity did not occur: Safety/medical concerns (limited by pain/activity tolerance)  Assist level: Contact Guard/Touching assist Assistive  device: Walker-rolling    Walk 10 feet on uneven surface  activity   Assist Walk 10 feet on uneven surfaces activity did not occur: Safety/medical concerns (limited by pain/activity tolerance)         Wheelchair     Assist Will patient use wheelchair at discharge?: No (patient  progressing to a functional ambulator) Type of Wheelchair: Manual    Wheelchair assist level: Supervision/Verbal cueing      Wheelchair 50 feet with 2 turns activity    Assist        Assist Level: Supervision/Verbal cueing   Wheelchair 150 feet activity     Assist      Assist Level: Supervision/Verbal cueing   Blood pressure (!) 110/59, pulse 78, temperature 98.4 F (36.9 C), temperature source Oral, resp. rate 18, height 5\' 4"  (1.626 m), weight 93.7 kg, SpO2 100 %.   Medical Problem List and Plan: 1.  Impaired function secondary to B/L Knee arthroplasties due to end stage arthritis B/L of knees             -patient may  Shower if knees covered  1/12- will get shower this AM             -ELOS/Goals: 10-14 days mod I  -Continue CIR 2.  Antithrombotics: -DVT/anticoagulation:  Pharmaceutical: Xarelto--will order dopplers due to elevated D dimer/hypoxia             -antiplatelet therapy: N/a 3. Pain Management: Continue tramadol prn.  Ice prn for local measures. Also gave her 50-100 mg q6 hours prn for tramadol             --Will trial low dose vicodin and monitor for side effects  1/12- tried 1/2 Norco "just now"- no relief so far, but was also taking tramadol prn- con't regimen  1/13- Norco somewhat helpful- cannot tolerate Oxycodone- con't regimen  1/14: pain is well controlled, continue Norco prn  1/15: counseled regarding using Norco 30 minutes prior to therapy session to maximize participation. Continue icing.   1/16: emailed Thedore Mins and Anderson Malta to see if we can get an IceMan for her- Thedore Mins says we may need to get approval from hospital.  4. Mood: LCSW to follow for evaluation and support.              -antipsychotic agents: N/A 5. Neuropsych: This patient is capable of making decisions on her own behalf. 6. Skin/Wound Care: Monitor wounds for healing.  1/12- will remove IV- not needed; will remove surgical dressings tomorrow as long as didn't get wet/removed  today.    1/13- original dressings off- knees look fantastic- con't daily nonstick dressings.  7. Fluids/Electrolytes/Nutrition: Monitor I/O. Electrolytes stable 1/12, repeat weekly.  8. PAF: Has history of intermittent palpitations (Dr. Harl Bowie). Well controlled, monitor HR tid. Encourage fluid intake.  9. ABLA: Hgb 10.7 on 1/12- monitor weekly                         --monitor for signs of bleeding.  10. Glucose intolerance: Recheck FBS in am.  1/12- BG this AM prior to food was 141- will double check to see if have HbA1c- last 1 01/18/20- is 5.1- so will just monitor 11. H/o depression: On Zoloft dialy.  12. Hypoxia: Order flutter valve for pulmonary hygiene             --wean oxygen as able.             --Albuterol MDI  prn SOB/wheezing. 13. IBS-diarrhea: Last BM 01/10 Continue colace for now.  1/13- LBM this AM- doing well 14. Mouth ulcer/sore/lesion  1/12- will start orojel prn for pt.  15. Elevated LFTs: tylenol discontinued. Tomorrow's labs changed to CMP    LOS: 5 days A FACE TO FACE EVALUATION WAS PERFORMED  Izora Ribas 02/04/2020, 4:39 PM

## 2020-02-04 NOTE — Plan of Care (Signed)
  Problem: Consults Goal: RH GENERAL PATIENT EDUCATION Description: See Patient Education module for education specifics. Outcome: Progressing   Problem: RH SKIN INTEGRITY Goal: RH STG ABLE TO PERFORM INCISION/WOUND CARE W/ASSISTANCE Description: STG Able To Perform Incision/Wound Care With min Assistance. Outcome: Progressing   Problem: RH PAIN MANAGEMENT Goal: RH STG PAIN MANAGED AT OR BELOW PT'S PAIN GOAL Description: Pain at or below level 4  Outcome: Progressing   Problem: RH KNOWLEDGE DEFICIT GENERAL Goal: RH STG INCREASE KNOWLEDGE OF SELF CARE AFTER HOSPITALIZATION Description: Patient will be able to manage care at discharge using handouts and education independently  Outcome: Progressing

## 2020-02-05 ENCOUNTER — Inpatient Hospital Stay (HOSPITAL_COMMUNITY): Payer: Medicare Other | Admitting: Physical Therapy

## 2020-02-05 ENCOUNTER — Inpatient Hospital Stay (HOSPITAL_COMMUNITY): Payer: Medicare Other

## 2020-02-05 LAB — COMPREHENSIVE METABOLIC PANEL
ALT: 39 U/L (ref 0–44)
AST: 25 U/L (ref 15–41)
Albumin: 3 g/dL — ABNORMAL LOW (ref 3.5–5.0)
Alkaline Phosphatase: 74 U/L (ref 38–126)
Anion gap: 13 (ref 5–15)
BUN: 11 mg/dL (ref 8–23)
CO2: 25 mmol/L (ref 22–32)
Calcium: 9.3 mg/dL (ref 8.9–10.3)
Chloride: 103 mmol/L (ref 98–111)
Creatinine, Ser: 0.9 mg/dL (ref 0.44–1.00)
GFR, Estimated: >60 mL/min (ref >60)
Glucose, Bld: 99 mg/dL (ref 70–99)
Potassium: 4.8 mmol/L (ref 3.5–5.1)
Sodium: 141 mmol/L (ref 135–145)
Total Bilirubin: 1.1 mg/dL (ref 0.3–1.2)
Total Protein: 6.2 g/dL — ABNORMAL LOW (ref 6.5–8.1)

## 2020-02-05 LAB — CBC
HCT: 34.2 % — ABNORMAL LOW (ref 36.0–46.0)
Hemoglobin: 10.6 g/dL — ABNORMAL LOW (ref 12.0–15.0)
MCH: 30.6 pg (ref 26.0–34.0)
MCHC: 31 g/dL (ref 30.0–36.0)
MCV: 98.8 fL (ref 80.0–100.0)
Platelets: 292 10*3/uL (ref 150–400)
RBC: 3.46 MIL/uL — ABNORMAL LOW (ref 3.87–5.11)
RDW: 14 % (ref 11.5–15.5)
WBC: 5.5 10*3/uL (ref 4.0–10.5)
nRBC: 0 % (ref 0.0–0.2)

## 2020-02-05 NOTE — Progress Notes (Signed)
Physical Therapy Session Note  Patient Details  Name: Melissa Velazquez MRN: [id number] Date of Birth: 11-01-1950  Today's Date: 02/05/2020 PT Individual Time: 1300-1400; 1600-1700 PT Individual Time Calculation (min): 60 min and 60 min  Short Term Goals: Week 1:  PT Short Term Goal 1 (Week 1): STG=LTG due to short ELOS.  Skilled Therapeutic Interventions/Progress Updates:    Session 1: Pt received seated in w/c in room, agreeable to PT session. Pt reports mild pain in B knees, L>R, unable to receive pain medication until later this PM. Provided ice pack at end of session for pain management. Sit to stand with CGA to RW throughout session. Ambulatory transfer into bathroom with RW and CGA. Toilet transfer with Supervision and RW, pt independent for pericare and management of clothing. Ambulation 2 x 150 ft with RW and CGA for balance, focus on heel strike and upright trunk. Standing alt 4" step-taps with RW and CGA for balance for B hip strengthening. Progression to alt L/R 3"step-ups 2 x 10 reps and backwards step-ups 3 x 10 reps with focus on increasing B knee flexion ROM. Nustep level 4 x 10 min with use of B UE/LE for global endurance as well as increasing knee ROM to pt tolerance. Pt requests to return to bed at end of session. Sit to supine Supervision with use of bedrail. Pt left semi-reclined in bed with needs in reach, bed alarm in place at end of session.  Session 2: Pt received semi-reclined in bed, agreeable to PT session. Pt reports 2/10 pain at rest in B knees. Semi-reclined to sitting EOB at Supervision level. Sit to stand with CGA to RW throughout session. Ambulation 2 x 150 ft with RW with close SBA to CGA throughout session, cues for upright trunk. Sit to/from supine on flat mat table at Supervision level. Supine BLE heel slides with use of gait belt for AAROM, 2 x 10 reps. R knee flexion 87 degrees, L knee flexion 84 degrees following heel slides. Supine SLR, bridge, and hip abd with  yellow thereband 3 x 10 reps each for BLE strengthening. Attempted to have pt perform mini-squats in standing at [facility], unable to fully complete due to knee pain. Sit to stand 2 x 10 reps to RW with close SBA for LE strengthening. Pt returned to bed at end of session, Supervision for bed mobility. Applied CPM machine to LLE at end of session, 0 degrees extension and 90 degrees flexion. Pt tolerates a few repetitions of CPM machine on LLE, agreeable to leave on. Placed ice pack on R knee for pain and edema management. Pt left semi-reclined in bed with needs in reach, bed alarm in place at end of session.  Therapy Documentation Precautions:  Precautions Precautions: Fall,Knee Precaution Comments: instructed no pillows under knee Restrictions Weight Bearing Restrictions: Yes RLE Weight Bearing: Weight bearing as tolerated LLE Weight Bearing: Weight bearing as tolerated Other Position/Activity Restrictions: WBAT    Therapy/Group: Individual Therapy   Excell Seltzer, PT, DPT  02/05/2020, 3:42 PM

## 2020-02-05 NOTE — Progress Notes (Signed)
[facility] PHYSICAL MEDICINE & REHABILITATION PROGRESS NOTE   Subjective/Complaints:  Pt reports most of her knee pain is tibial plateau- not actually IN the knee joint.  Did CPM over the weekend.   Bowels working OK.    ROS:  Pt denies SOB, abd pain, CP, N/V/C/D, and vision changes    Objective:   No results found. Recent Labs    02/05/20 0543  WBC 5.5  HGB 10.6*  HCT 34.2*  PLT 292   Recent Labs    02/05/20 0543  NA 141  K 4.8  CL 103  CO2 25  GLUCOSE 99  BUN 11  CREATININE 0.90  CALCIUM 9.3    Intake/Output Summary (Last 24 hours) at 02/05/2020 1023 Last data filed at 02/04/2020 1808 Gross per 24 hour  Intake 480 ml  Output -  Net 480 ml        Physical Exam: Vital Signs Blood pressure 138/78, pulse 78, temperature 97.9 F (36.6 C), resp. rate 16, height 5\' 4"  (1.626 m), weight 93.7 kg, SpO2 97 %. Gen: no distress, normal appearing- sitting up in bed- asking when can go home, NAD HEENT: oral mucosa pink and moist, NCAT Cardio: RRR Chest: CTA B/L- no W/R/R- good air movement Abd: Soft, NT, ND, (+)BS  Ext: no edema Psych: pleasant, normal affect- very polite Musculoskeletal:     Cervical back: Normal range of motion. No rigidity.     Comments: UEs 5/5 in biceps, triceps, WE grip and finger abd LEs- 2/5 in HF B/L; KE/KF NT due to knee surgery, and DF and PF 5/5 B/L B/L knees look fnatastic- no erythema; no drainage- ? Sutures in place? Looked WITH nurse- maybe has 3 sutures?  Neurological:  Intact to light touch in all 4 extremities AOx3   Assessment/Plan: 1. Functional deficits which require 3+ hours per day of interdisciplinary therapy in a comprehensive inpatient rehab setting.  Physiatrist is providing close team supervision and 24 hour management of active medical problems listed below.  Physiatrist and rehab team continue to assess barriers to discharge/monitor patient progress toward functional and medical goals  Care  Tool:  Bathing    Body parts bathed by patient: Right arm,Left arm,Chest,Abdomen,Front perineal area,Buttocks,Right upper leg,Left upper leg,Right lower leg,Left lower leg,Face   Body parts bathed by helper: Buttocks,Right lower leg,Left lower leg     Bathing assist Assist Level: Supervision/Verbal cueing     Upper Body Dressing/Undressing Upper body dressing   What is the patient wearing?: Pull over shirt,Bra    Upper body assist Assist Level: Independent    Lower Body Dressing/Undressing Lower body dressing      What is the patient wearing?: Underwear/pull up,Pants     Lower body assist Assist for lower body dressing: Supervision/Verbal cueing     Toileting Toileting    Toileting assist Assist for toileting: Contact Guard/Touching assist     Transfers Chair/bed transfer  Transfers assist     Chair/bed transfer assist level: Contact Guard/Touching assist Chair/bed transfer assistive device: Programmer, multimedia   Ambulation assist      Assist level: Contact Guard/Touching assist Assistive device: Walker-rolling Max distance: 151ft   Walk 10 feet activity   Assist     Assist level: Contact Guard/Touching assist Assistive device: Walker-rolling   Walk 50 feet activity   Assist    Assist level: Contact Guard/Touching assist Assistive device: Walker-rolling    Walk 150 feet activity   Assist Walk 150 feet activity did not occur: Safety/medical  concerns (limited by pain/activity tolerance)  Assist level: Contact Guard/Touching assist Assistive device: Walker-rolling    Walk 10 feet on uneven surface  activity   Assist Walk 10 feet on uneven surfaces activity did not occur: Safety/medical concerns (limited by pain/activity tolerance)         Wheelchair     Assist Will patient use wheelchair at discharge?: No (patient progressing to a functional ambulator) Type of Wheelchair: Manual    Wheelchair assist level:  Supervision/Verbal cueing      Wheelchair 50 feet with 2 turns activity    Assist        Assist Level: Supervision/Verbal cueing   Wheelchair 150 feet activity     Assist      Assist Level: Supervision/Verbal cueing   Blood pressure 138/78, pulse 78, temperature 97.9 F (36.6 C), resp. rate 16, height 5\' 4"  (1.626 m), weight 93.7 kg, SpO2 97 %.   Medical Problem List and Plan: 1.  Impaired function secondary to B/L Knee arthroplasties due to end stage arthritis B/L of knees             -patient may  Shower if knees covered  1/12- will get shower this AM             -ELOS/Goals: 10-14 days mod I  -Continue CIR 2.  Antithrombotics: -DVT/anticoagulation:  Pharmaceutical: Xarelto--will order dopplers due to elevated D dimer/hypoxia             -antiplatelet therapy: N/a 3. Pain Management: Continue tramadol prn.  Ice prn for local measures. Also gave her 50-100 mg q6 hours prn for tramadol             --Will trial low dose vicodin and monitor for side effects  1/12- tried 1/2 Norco "just now"- no relief so far, but was also taking tramadol prn- con't regimen  1/13- Norco somewhat helpful- cannot tolerate Oxycodone- con't regimen  1/14: pain is well controlled, continue Norco prn  1/15: counseled regarding using Norco 30 minutes prior to therapy session to maximize participation. Continue icing.   1/16: emailed Thedore Mins and [facility] Malta to see if we can get an IceMan for her- Thedore Mins says we may need to get approval from hospital.   1/17- don't think will get Ice man while here- maybe in future? 4. Mood: LCSW to follow for evaluation and support.              -antipsychotic agents: N/A 5. Neuropsych: This patient is capable of making decisions on her own behalf. 6. Skin/Wound Care: Monitor wounds for healing.  1/12- will remove IV- not needed; will remove surgical dressings tomorrow as long as didn't get wet/removed today.    1/13- original dressings off- knees look fantastic-  con't daily nonstick dressings.   1/17- incisions look great- sutures that are there can be removed tomorrow 7. Fluids/Electrolytes/Nutrition: Monitor I/O. Electrolytes stable 1/12, repeat weekly.  8. PAF: Has history of intermittent palpitations (Dr. Harl Bowie). Well controlled, monitor HR tid. Encourage fluid intake.  9. ABLA: Hgb 10.7 on 1/12- monitor weekly                         --monitor for signs of bleeding.  10. Glucose intolerance: Recheck FBS in am.  1/12- BG this AM prior to food was 141- will double check to see if have HbA1c- last 1 01/18/20- is 5.1- so will just monitor 11. H/o depression: On Zoloft dialy.  12. Hypoxia: Order flutter  valve for pulmonary hygiene             --wean oxygen as able.             --Albuterol MDI prn SOB/wheezing. 13. IBS-diarrhea: Last BM 01/10 Continue colace for now.  1/13- LBM this AM- doing well 14. Mouth ulcer/sore/lesion  1/12- will start orojel prn for pt.  15. Elevated LFTs: tylenol discontinued. Tomorrow's labs changed to [facility]  1/17- LFTS are back to normal- was 54/116- now 25 and 39-     LOS: 6 days A FACE TO FACE EVALUATION WAS PERFORMED  Shandy Checo 02/05/2020, 10:23 AM

## 2020-02-05 NOTE — Progress Notes (Signed)
Occupational Therapy Session Note  Patient Details  Name: Melissa Velazquez MRN: [id number] Date of Birth: [birth year]  Today's Date: 02/05/2020 OT Individual Time: 8921-1941 OT Individual Time Calculation (min): 70 min    Short Term Goals: Week 1:  OT Short Term Goal 1 (Week 1): STG=LTG d/t ELOS  Skilled Therapeutic Interventions/Progress Updates:    OT intervention with focus on functional amb with RW, bathing at shower level, dressing with sit<>stand from seat, standing at sink for grooming tasks, safety awareness, activity tolerance, and discharge planning. Pt amb with RW to bathroom and transferred to TTB in shower. Bathing and dressing at supervision level with sit<>stand. Pt able to partially accomplish figure 4 for donning socks. Standing at sink with supervision for grooming tasks. Discussed strategies for feeding her dog after discharge. Recommended that pt not walk dog on leash. Pt verbalized understanding of recommendation and agreed. Pt stated her dog had pulled her over in the past. Pt remained seated in w/c with all needs within reach. NT present.   Therapy Documentation Precautions:  Precautions Precautions: Fall,Knee Precaution Comments: instructed no pillows under knee Restrictions Weight Bearing Restrictions: No RLE Weight Bearing: Weight bearing as tolerated LLE Weight Bearing: Weight bearing as tolerated Other Position/Activity Restrictions: WBAT   Pain:  Pt c/o 4/10 B/L knee pain; meds admin at beginning of therapy; ice applied at end of session   Therapy/Group: Individual Therapy  Leroy Libman 02/05/2020, 10:27 AM

## 2020-02-06 ENCOUNTER — Inpatient Hospital Stay (HOSPITAL_COMMUNITY): Payer: Medicare Other | Admitting: Physical Therapy

## 2020-02-06 ENCOUNTER — Inpatient Hospital Stay (HOSPITAL_COMMUNITY): Payer: Medicare Other

## 2020-02-06 MED ORDER — DICLOFENAC SODIUM 1 % EX GEL
4.0000 g | Freq: Four times a day (QID) | CUTANEOUS | Status: DC
  Administered 2020-02-06 – 2020-02-11 (×16): 4 g via TOPICAL
  Filled 2020-02-06: qty 100

## 2020-02-06 NOTE — Patient Care Conference (Signed)
Inpatient RehabilitationTeam Conference and Plan of Care Update Date: 02/06/2020   Time: 11:19 AM    Patient Name: Melissa Velazquez      Medical Record Number: [id number]  Date of Birth: [birth year] Sex: Female         Room/Bed: 4W14C/4W14C-01 Payor Info: Payor: MEDICARE / Plan: MEDICARE PART A AND B / Product Type: *No Product type* /    Admit Date/Time:  01/30/2020  4:47 PM  Primary Diagnosis:  OA (osteoarthritis) of knee  Hospital Problems: Principal Problem:   OA (osteoarthritis) of knee    Expected Discharge Date: Expected Discharge Date: 02/12/20  Team Members Present: Physician leading conference: Dr. [name] Care Coodinator Present: Loralee Pacas, LCSWA;Kayceon Oki Creig Hines, RN, BSN, [place] Nurse Present: Other (comment) Michele Rockers, RN) PT Present: Excell Seltzer, PT OT Present: Roanna Epley, [place], OT PPS Coordinator present : Ileana Ladd, Burna Mortimer, SLP     Current Status/Progress Goal Weekly Team Focus  Bowel/Bladder   Pt continent of b/b, LBM 02/05/20  pt will remain continent of b/b  Q2h toileting   Swallow/Nutrition/ Hydration             ADL's   bathing/dressing with AE-supervision; functional tranfsers/toileting-supervision; home mgmt tasks-supervision  mod I overall  activity tolerance, safety awareness, educaiton   Mobility   Supervision bed mobility, Supervision to CGA transfers with RW, gait up to 150 ft Supervision to CGA with RW, CGA stairs  mod I overall, Supervision to mod I gait, CGA stairs  B knee ROM, increasing independence with transfers and mobility   Communication             Safety/Cognition/ Behavioral Observations            Pain   Pt c/o bilateral Lower ext pain. PRN tramadol effective.  Pt will be free of pain  Q2h assessment of pain   Skin   bilateral knee surgical incisions.  No further breakdown or infection  Assess skin qshift/prn     Discharge Planning:  Pt will d/c to home. Pt will need to be Mod  I as her sister can only provide intermittent support in which she can periodically check in on patient.   Team Discussion: Patient has a couple of sutures that need to be removed and MD has put in order to have removed. Patient has Tramadol and Norco for pain, continent B/B, reports of a daily BM. Plans to discharge home.  Patient on target to meet rehab goals: Supervision to contact guard overall. CPM set at 90 degrees now and using several times a day. Patient reports pain in knees with mobility but therapy is working on ROM. Patient reports she has a 19 lb. dog at home and it's not recommended that she walk her dog. She has mod I goals.  *See Care Plan and progress notes for long and short-term goals.   Revisions to Treatment Plan:  MD is working on pain and pain medications.  Teaching Needs: Family education, medication management, weight bearing precautions, wound/skin care, ROM education.  Current Barriers to Discharge: Inaccessible home environment, Decreased caregiver support, Home enviroment access/layout, Wound care, Lack of/limited family support, Weight, Weight bearing restrictions and Behavior  Possible Resolutions to Barriers: Continue current medications, provide emotional support to family and patient.     Medical Summary Current Status: Pain biggest limiter- Norco is OK with LFTs back to baseline- continent B/B- LBM daily  Barriers to Discharge: Decreased family/caregiver support;Home enviroment access/layout;Medical stability;Weight;Weight bearing restrictions;Wound care  Barriers to Discharge Comments: d/c home by self- needs to be Mod I- will have intermittent support- has dog 19 lbs at home-   d/c 1/24- Monday  since nees to be Mod I Possible Resolutions to [employer]: Super to [place]- right now- working on more ROM of knees- getting CPM daily multiple times- goals Mod I- Norco needs to be used in addition to tramadol-   Continued Need for Acute Rehabilitation  Level of Care: The patient requires daily medical management by a physician with specialized training in physical medicine and rehabilitation for the following reasons: Direction of a multidisciplinary physical rehabilitation program to maximize functional independence : Yes Medical management of patient stability for increased activity during participation in an intensive rehabilitation regime.: Yes Analysis of laboratory values and/or radiology reports with any subsequent need for medication adjustment and/or medical intervention. : Yes   I attest that I was present, lead the team conference, and concur with the assessment and plan of the team.   Cristi Loron 02/06/2020, 6:31 PM

## 2020-02-06 NOTE — Progress Notes (Signed)
Physical Therapy Session Note  Patient Details  Name: Melissa Velazquez MRN: [id number] Date of Birth: Jun 01, 1950  Today's Date: 02/06/2020 PT Individual Time: 1130-1200; 1545-1700 PT Individual Time Calculation (min): 30 min and 75 min  Short Term Goals: Week 1:  PT Short Term Goal 1 (Week 1): STG=LTG due to short ELOS.  Skilled Therapeutic Interventions/Progress Updates:    Session 1: Pt received seated in bed, agreeable to PT session. Pt reports mild pain in B knees at rest, increases with mobility. Utilized ice packs to B knees at end of session for pain management. Bed mobility Supervision. Sit to stand with Supervision to RW. Ambulation 2 x 150 ft with RW and Supervision. Ascend/descend 8 x 6" steps laterally with L handrail and CGA to simulate home environment. Ascend/descend 12 x 6" steps with 2 handrails and step-to gait pattern. Attempt to ascend with LLE first, pt unable. Pt able to ascend with RLE and descend with LLE first. Standing on 3" step with LLE taking step to 3rd step with RLE with focus on L knee control, 2 x 10 reps. Pt then able to alternate and perform exercise on RLE. Pt returned to room at end of session, left seated in w/c in room with needs in reach, ice packs in place to B knees.  Session 2: Pt received standing at sink washing hands after toileting with Supervision assist from NT. Pt returns to sitting EOB with RW and Supervision. Pt then becomes emotional and tearful regarding loss of independence and control she has experienced since her surgery as well as wish to d/c home sooner than planned d/c date. Provided emotional support to patient. Pt initially reluctant to participate in therapy session due to feeling emotional but then requesting to wash some of her clothing items. Pt able to gather clothing items from around the room reaching outside BOS and across midline as well as retrieving items from the floor with use of RW and close Supervision for balance. Pt able to  place bag of clothing items in bag attached to her walker for increased safety with transport of items. Ambulation to laundry room with RW and Supervision. Pt able to start laundry while standing with RW at Supervision level overall. Pt then ambulates 500 ft (+) in community environment navigating elevators and functional environment of [employer] and hospital atrium seating area. Pt utilizes RW at Supervision level for all mobility. Pt able to stand in line at [facility], place her order, and problem solve how to carry her order from counter to a table utilizing hanging bag on walker for transport. Pt is able to perform sit to stand to RW from several functional surfaces in hospital atrium seating area at Supervision level. Discussed energy conservation regarding outings as well as safety in the home including pulling up throw rugs as they are a tripping hazard. Pt demos good understanding of education provided. Pt returned to therapy unit for remainder of session. Standing alt L/R 4" step-ups 2 x 15 reps, step-downs 2 x 15 reps for BLE strengthening and knee flexion ROM. Pt returned to bed at end of session at Supervision level. Provided pt with ice packs to B knees at end of session, instructions to don CPM machine following dinner set to 90 degrees knee flexion. Pt left semi-reclined in bed with needs in reach, bed alarm in place at end of session.  Therapy Documentation Precautions:  Precautions Precautions: Fall,Knee Precaution Comments: instructed no pillows under knee Restrictions Weight Bearing Restrictions: Yes RLE Weight  Bearing: Weight bearing as tolerated LLE Weight Bearing: Weight bearing as tolerated Other Position/Activity Restrictions: WBAT   Therapy/Group: Individual Therapy   Excell Seltzer, PT, DPT  02/06/2020, 3:05 PM

## 2020-02-06 NOTE — Progress Notes (Signed)
Patient ID: Melissa Velazquez, female   DOB: [birth year], 70 y.o.   MRN: [id number]  SW met with pt in room to provide updates from team conference, and d/c date 02/12/20. Pt aware SW to call her sister Melissa Velazquez to provide updates.  *SW spoke with pt sister Melissa Velazquez [phone]) to update on above. She will be here tomorrow for family edu around 11am. SW instructed her to go to any therapy sessions she may have during the time she will be here tomorrow. Melissa Velazquez intends to pick up pt to transport her home on Monday.   Loralee Pacas, MSW, [place] Office: [phone] Cell: [phone] Fax: [phone]

## 2020-02-06 NOTE — Progress Notes (Signed)
Orthopedic Tech Progress Note Patient Details:  Melissa Velazquez 04/09/1950 [id number] Spoke with RN about patient needing to be on CPM.. but patient has therapy at 345 per RN. asked her to CALL once patient comes back from therapy.  Patient ID: Melissa Velazquez, female   DOB: 04/04/1950, 70 y.o.   MRN: [id number]   [name] 02/06/2020, 3:04 PM

## 2020-02-06 NOTE — Progress Notes (Signed)
Occupational Therapy Session Note  Patient Details  Name: Melissa Velazquez MRN: [id number] Date of Birth: Sep 09, 1950  Today's Date: 02/06/2020 OT Individual Time: 1345-1425 OT Individual Time Calculation (min): 40 min    Short Term Goals: Week 1:  OT Short Term Goal 1 (Week 1): STG=LTG d/t ELOS  Skilled Therapeutic Interventions/Progress Updates:    OT intervention with focus on functional amb with RW, kitchen tasks, problem solving how to feed pt's dog, and safety awareness to increase independence with BADLs and prepare for discharge on 1/24. Pt amb with RW to ADL kitchen and practiced strategies for feeding her dog. Pt retrieves canned dog food from refrigerator and places in RW bag; sit in chair beside food dish and scoops dry food onto dish and combines with canned food. Pt able to complete all tasks from seated position. Discussed strategies for transporting meals to table. Pt will be able to stand between counter and table to transfer containers. Pt also investigating purchasing tray for RW. Pt returned to room and returned to bed. Bed alarm activated. All needs within reach.   Therapy Documentation Precautions:  Precautions Precautions: Fall,Knee Precaution Comments: instructed no pillows under knee Restrictions Weight Bearing Restrictions: Yes RLE Weight Bearing: Weight bearing as tolerated LLE Weight Bearing: Weight bearing as tolerated Other Position/Activity Restrictions: WBAT   Pain:  Pt c/o 5/10 B/L knee pain; ice applied   Therapy/Group: Individual Therapy  Leroy Libman 02/06/2020, 2:42 PM

## 2020-02-06 NOTE — Progress Notes (Signed)
[facility] PHYSICAL MEDICINE & REHABILITATION PROGRESS NOTE   Subjective/Complaints:  Pt reports pain is managed when  Using tramadol, but NOT managed with PT/OT.   Asking about increased LFTs and Norco- explained her LFTs are back to baseline- I wouldn't take Norco ALL day, but 1-3x/day won't be bad for LFTs, since taking 1/2 to 1 tab, not more.   Looks like has a few sutures- will remove.   ROS:  Pt denies SOB, abd pain, CP, N/V/C/D, and vision changes   Objective:   No results found. Recent Labs    02/05/20 0543  WBC 5.5  HGB 10.6*  HCT 34.2*  PLT 292   Recent Labs    02/05/20 0543  NA 141  K 4.8  CL 103  CO2 25  GLUCOSE 99  BUN 11  CREATININE 0.90  CALCIUM 9.3    Intake/Output Summary (Last 24 hours) at 02/06/2020 1037 Last data filed at 02/05/2020 2030 Gross per 24 hour  Intake 360 ml  Output -  Net 360 ml        Physical Exam: Vital Signs Blood pressure (!) 143/73, pulse 76, temperature 98.3 F (36.8 C), resp. rate 18, height 5\' 4"  (1.626 m), weight 93.7 kg, SpO2 99 %. Gen: sitting up in bed; appropriate, a little nervous, NAD HEENT: oral mucosa pink and moist, NCAT Cardio: RRR- no JVD Chest: CTA B/L- no W/R/R- good air movement Abd: Soft, NT, ND, (+)BS   Ext: no edema Psych: pleasant, normal affect- very polite, but a little anxious Musculoskeletal:     Cervical back: Normal range of motion. No rigidity.     Comments: UEs 5/5 in biceps, triceps, WE grip and finger abd LEs- 2/5 in HF B/L; KE/KF NT due to knee surgery, and DF and PF 5/5 B/L B/L knees has clear dressing- no drainage- no erythema look great- see ~ 2-3 sutures total-  Neurological:  Intact to light touch in all 4 extremities AOx3   Assessment/Plan: 1. Functional deficits which require 3+ hours per day of interdisciplinary therapy in a comprehensive inpatient rehab setting.  Physiatrist is providing close team supervision and 24 hour management of active medical problems listed  below.  Physiatrist and rehab team continue to assess barriers to discharge/monitor patient progress toward functional and medical goals  Care Tool:  Bathing    Body parts bathed by patient: Right arm,Left arm,Chest,Abdomen,Front perineal area,Buttocks,Right upper leg,Left upper leg,Right lower leg,Left lower leg,Face   Body parts bathed by helper: Buttocks,Right lower leg,Left lower leg     Bathing assist Assist Level: Set up assist     Upper Body Dressing/Undressing Upper body dressing   What is the patient wearing?: Pull over shirt,Bra    Upper body assist Assist Level: Independent    Lower Body Dressing/Undressing Lower body dressing      What is the patient wearing?: Underwear/pull up,Pants     Lower body assist Assist for lower body dressing: Supervision/Verbal cueing     Toileting Toileting    Toileting assist Assist for toileting: Contact Guard/Touching assist     Transfers Chair/bed transfer  Transfers assist     Chair/bed transfer assist level: Contact Guard/Touching assist Chair/bed transfer assistive device: Programmer, multimedia   Ambulation assist      Assist level: Contact Guard/Touching assist Assistive device: Walker-rolling Max distance: 169ft   Walk 10 feet activity   Assist     Assist level: Contact Guard/Touching assist Assistive device: Walker-rolling   Walk 50 feet activity  Assist    Assist level: Contact Guard/Touching assist Assistive device: Walker-rolling    Walk 150 feet activity   Assist Walk 150 feet activity did not occur: Safety/medical concerns (limited by pain/activity tolerance)  Assist level: Contact Guard/Touching assist Assistive device: Walker-rolling    Walk 10 feet on uneven surface  activity   Assist Walk 10 feet on uneven surfaces activity did not occur: Safety/medical concerns (limited by pain/activity tolerance)         Wheelchair     Assist Will patient use  wheelchair at discharge?: No (patient progressing to a functional ambulator) Type of Wheelchair: Manual    Wheelchair assist level: Supervision/Verbal cueing      Wheelchair 50 feet with 2 turns activity    Assist        Assist Level: Supervision/Verbal cueing   Wheelchair 150 feet activity     Assist      Assist Level: Supervision/Verbal cueing   Blood pressure (!) 143/73, pulse 76, temperature 98.3 F (36.8 C), resp. rate 18, height 5\' 4"  (1.626 m), weight 93.7 kg, SpO2 99 %.   Medical Problem List and Plan: 1.  Impaired function secondary to B/L Knee arthroplasties due to end stage arthritis B/L of knees             -patient may  Shower if knees covered  1/12- will get shower this AM             -ELOS/Goals: 10-14 days mod I  -Continue CIR 2.  Antithrombotics: -DVT/anticoagulation:  Pharmaceutical: Xarelto--will order dopplers due to elevated D dimer/hypoxia             -antiplatelet therapy: N/a 3. Pain Management: Continue tramadol prn.  Ice prn for local measures. Also gave her 50-100 mg q6 hours prn for tramadol             --Will trial low dose vicodin and monitor for side effects  1/12- tried 1/2 Norco "just now"- no relief so far, but was also taking tramadol prn- con't regimen  1/15: counseled regarding using Norco 30 minutes prior to therapy session to maximize participation. Continue icing.   1/18- will do Norco 1/2-1 tab - suggested 2-3x/day for therapy times only- then use tramadol otherwise. I think it will be OK since LFTs back to complete normal baseline.  4. Mood: LCSW to follow for evaluation and support.              -antipsychotic agents: N/A 5. Neuropsych: This patient is capable of making decisions on her own behalf. 6. Skin/Wound Care: Monitor wounds for healing.  1/12- will remove IV- not needed; will remove surgical dressings tomorrow as long as didn't get wet/removed today.    1/13- original dressings off- knees look fantastic- con't  daily nonstick dressings.   1/17- incisions look great- sutures that are there can be removed tomorrow  1/18- wrote to remove sutures- has 2-3.  7. Fluids/Electrolytes/Nutrition: Monitor I/O. Electrolytes stable 1/12, repeat weekly.  8. PAF: Has history of intermittent palpitations (Dr. Harl Bowie). Well controlled, monitor HR tid. Encourage fluid intake.  9. ABLA: Hgb 10.7 on 1/12- monitor weekly                         --monitor for signs of bleeding.  10. Glucose intolerance: Recheck FBS in am.  1/12- BG this AM prior to food was 141- will double check to see if have HbA1c- last 1 01/18/20- is 5.1- so will  just monitor 11. H/o depression: On Zoloft dialy.  12. Hypoxia: Order flutter valve for pulmonary hygiene             --wean oxygen as able.             --Albuterol MDI prn SOB/wheezing. 13. IBS-diarrhea: Last BM 01/10 Continue colace for now.  1/13- LBM this AM- doing well  1/18- bowels working well per pt 14. Mouth ulcer/sore/lesion  1/12- will start orojel prn for pt.  15. Elevated LFTs: tylenol discontinued. Tomorrow's labs changed to [facility]  1/17- LFTS are back to normal- was 54/116- now 25 and 39-   1/18- will allow low dose Norco since LFTs back to baseline.     LOS: 7 days A FACE TO FACE EVALUATION WAS PERFORMED  Denario Bagot 02/06/2020, 10:37 AM

## 2020-02-06 NOTE — Progress Notes (Signed)
Occupational Therapy Session Note  Patient Details  Name: CHRISSIE DACQUISTO MRN: [id number] Date of Birth: October 28, 1950  Today's Date: 02/06/2020 OT Individual Time: 4742-5956 OT Individual Time Calculation (min): 55 min    Short Term Goals: Week 1:  OT Short Term Goal 1 (Week 1): STG=LTG d/t ELOS  Skilled Therapeutic Interventions/Progress Updates:    Pt resting in bed upon arrival. OT intervention with focus on BADL training, funcitonal amb with RW, standing balance, discharge planning, and safety awareness to increase independence with BADLs. Pt amb with RW into bathroom and transferred to TTB. Pt completed bathing and dressing with sit<>stand from seat with supervision. Pt used long handle sponge to assist with bathing tasks. Pt donned clothing without use of AE. Pt stood at sink to complete grooming tasks. Pt amb with RW to day room and stood for [employer]. Pt amb with RW and used reacher to retrieve bags from floor. Pt also donned gloves and stood at table to clean bags before returning to room. Pt returned to bed. Bed alarm activated and all needs within reach.   Therapy Documentation Precautions:  Precautions Precautions: Fall,Knee Precaution Comments: instructed no pillows under knee Restrictions Weight Bearing Restrictions: Yes RLE Weight Bearing: Weight bearing as tolerated LLE Weight Bearing: Weight bearing as tolerated Other Position/Activity Restrictions: WBAT  Pain: Pain Assessment Pain Scale: 0-10 Pain Score: 6  Pain Location: Knee Pain Orientation: Right;Left Pain Intervention(s): Meds admin prior to therapy; repositioned  Therapy/Group: Individual Therapy  Leroy Libman 02/06/2020, 10:03 AM

## 2020-02-06 NOTE — Progress Notes (Addendum)
Occupational Therapy Discharge Summary  Patient Details  Name: MANILA ROMMEL MRN: [id number] Date of Birth: 22-May-1950  Patient has met 12 of 12 long term goals due to improved activity tolerance, improved balance, ability to compensate for deficits and improved coordination.  Patient to discharge at overall Modified Independent level.  Patient's sister is independent to provide the necessary  assistance at discharge and has attended an OT family training session.    Addendum: filing error with signing date of discharge summary  All goals met.   Recommendation:  Patient will benefit from ongoing skilled OT services in home health setting to continue to advance functional skills in the area of iADL.  Equipment: No equipment provided  Reasons for discharge: treatment goals met and discharge from hospital  Patient/family agrees with progress made and goals achieved: Yes  OT Discharge Vision Baseline Vision/History: Wears glasses Wears Glasses: Reading only Patient Visual Report: No change from baseline Vision Assessment?: No apparent visual deficits Perception  Perception: Within Functional Limits Praxis Praxis: Intact Cognition Overall Cognitive Status: Within Functional Limits for tasks assessed Arousal/Alertness: Awake/alert Orientation Level: Oriented X4 Attention: Selective Selective Attention: Appears intact Memory: Appears intact Awareness: Appears intact Problem Solving: Appears intact Safety/Judgment: Appears intact Sensation Sensation Light Touch: Appears Intact Hot/Cold: Appears Intact Proprioception: Appears Intact Stereognosis: Not tested Coordination Gross Motor Movements are Fluid and Coordinated: Yes Fine Motor Movements are Fluid and Coordinated: Yes Motor  Motor Motor - Skilled Clinical Observations: Lower extremity weakness limted by pain in surgical sites Trunk/Postural Assessment  Cervical Assessment Cervical Assessment: Within Functional  Limits Thoracic Assessment Thoracic Assessment: Within Functional Limits Lumbar Assessment Lumbar Assessment: Within Functional Limits  Balance Dynamic Sitting Balance Dynamic Sitting - Balance Support: During functional activity Dynamic Sitting - Level of Assistance: 6: Modified independent (Device/Increase time) Extremity/Trunk Assessment RUE Assessment RUE Assessment: Within Functional Limits LUE Assessment LUE Assessment: Within Functional Limits   Leroy Libman 02/06/2020, 7:36 AM

## 2020-02-06 NOTE — Progress Notes (Signed)
Occupational Therapy Weekly Progress Note  Patient Details  Name: Melissa Velazquez MRN: [id number] Date of Birth: Nov 10, 1950  Beginning of progress report period: January 31, 2020 End of progress report period: February 06, 2020  Pt making steady progress with BADLs and IADLs. Bathing at shower and dressing with sit<>stand with supervision. Toilet transfers and toileting with supervsion. Pt practiced kitchen tasks and simple home management with supervision. Discharge planning ongoing.   Patient continues to demonstrate the following deficits: muscle weakness and muscle joint tightness, decreased cardiorespiratoy endurance, decreased coordination and decreased standing balance and decreased balance strategies and therefore will continue to benefit from skilled OT intervention to enhance overall performance with BADL and iADL.  Patient progressing toward long term goals..  Continue plan of care.  OT Short Term Goals Week 1:  OT Short Term Goal 1 (Week 1): STG=LTG d/t ELOS OT Short Term Goal 1 - Progress (Week 1): Progressing toward goal Week 2:  OT Short Term Goal 2 (Week 2): STG=LTG 2/2 ELOS      Leroy Libman 02/06/2020, 2:52 PM

## 2020-02-07 ENCOUNTER — Inpatient Hospital Stay (HOSPITAL_COMMUNITY): Payer: Medicare Other

## 2020-02-07 ENCOUNTER — Inpatient Hospital Stay (HOSPITAL_COMMUNITY): Payer: Medicare Other | Admitting: Physical Therapy

## 2020-02-07 NOTE — Progress Notes (Signed)
[facility] PHYSICAL MEDICINE & REHABILITATION PROGRESS NOTE   Subjective/Complaints:  Pt thinks Norco helps, but getting nausea/indigestion with it- wondering how to treat- explained needs to take it with some food EVERY time.   Voltaren gel helped knee pain some LBM this AM Getting overwhelmed slightly with the pain, etc- is more than she thought it would be.    ROS: Pt denies SOB, abd pain, CP, N/V/C/D, and vision changes   Objective:   No results found. Recent Labs    02/05/20 0543  WBC 5.5  HGB 10.6*  HCT 34.2*  PLT 292   Recent Labs    02/05/20 0543  NA 141  K 4.8  CL 103  CO2 25  GLUCOSE 99  BUN 11  CREATININE 0.90  CALCIUM 9.3    Intake/Output Summary (Last 24 hours) at 02/07/2020 0927 Last data filed at 02/07/2020 0730 Gross per 24 hour  Intake 838 ml  Output -  Net 838 ml        Physical Exam: Vital Signs Blood pressure (!) 142/86, pulse 71, temperature 99.5 F (37.5 C), temperature source Oral, resp. rate 16, height 5\' 4"  (1.626 m), weight 93.7 kg, SpO2 95 %. Gen: laying in bed; supine, appropriate, says a little overwhelmed with pain issues, NAD HEENT: oral mucosa pink and moist, NCAT Cardio: RRR_ no JVD Chest: CTA B/L- no W/R/R- good air movement Abd: Soft, NT, ND, (+)BS    Ext: no edema Psych: cordial but a little anxious Musculoskeletal:     Cervical back: Normal range of motion. No rigidity.     Comments: UEs 5/5 in biceps, triceps, WE grip and finger abd LEs- 2/5 in HF B/L; KE/KF NT due to knee surgery, and DF and PF 5/5 B/L B/L knees look fantastic- clear tegaderm over B/L knees-  No erythema; no drainage- less swelling; no fingernails on fingers Neurological:  Intact to light touch in all 4 extremities AOx3   Assessment/Plan: 1. Functional deficits which require 3+ hours per day of interdisciplinary therapy in a comprehensive inpatient rehab setting.  Physiatrist is providing close team supervision and 24 hour management of  active medical problems listed below.  Physiatrist and rehab team continue to assess barriers to discharge/monitor patient progress toward functional and medical goals  Care Tool:  Bathing    Body parts bathed by patient: Right arm,Left arm,Chest,Abdomen,Front perineal area,Buttocks,Right upper leg,Left upper leg,Right lower leg,Left lower leg,Face   Body parts bathed by helper: Buttocks,Right lower leg,Left lower leg     Bathing assist Assist Level: Set up assist     Upper Body Dressing/Undressing Upper body dressing   What is the patient wearing?: Pull over shirt,Bra    Upper body assist Assist Level: Independent    Lower Body Dressing/Undressing Lower body dressing      What is the patient wearing?: Underwear/pull up,Pants     Lower body assist Assist for lower body dressing: Supervision/Verbal cueing     Toileting Toileting    Toileting assist Assist for toileting: Contact Guard/Touching assist     Transfers Chair/bed transfer  Transfers assist     Chair/bed transfer assist level: Supervision/Verbal cueing Chair/bed transfer assistive device: Programmer, multimedia   Ambulation assist      Assist level: Supervision/Verbal cueing Assistive device: Walker-rolling Max distance: 175ft   Walk 10 feet activity   Assist     Assist level: Supervision/Verbal cueing Assistive device: Walker-rolling   Walk 50 feet activity   Assist    Assist  level: Supervision/Verbal cueing Assistive device: Walker-rolling    Walk 150 feet activity   Assist Walk 150 feet activity did not occur: Safety/medical concerns (limited by pain/activity tolerance)  Assist level: Supervision/Verbal cueing Assistive device: Walker-rolling    Walk 10 feet on uneven surface  activity   Assist Walk 10 feet on uneven surfaces activity did not occur: Safety/medical concerns (limited by pain/activity tolerance)         Wheelchair     Assist Will  patient use wheelchair at discharge?: No (patient progressing to a functional ambulator) Type of Wheelchair: Manual    Wheelchair assist level: Supervision/Verbal cueing      Wheelchair 50 feet with 2 turns activity    Assist        Assist Level: Supervision/Verbal cueing   Wheelchair 150 feet activity     Assist      Assist Level: Supervision/Verbal cueing   Blood pressure (!) 142/86, pulse 71, temperature 99.5 F (37.5 C), temperature source Oral, resp. rate 16, height 5\' 4"  (1.626 m), weight 93.7 kg, SpO2 95 %.   Medical Problem List and Plan: 1.  Impaired function secondary to B/L Knee arthroplasties due to end stage arthritis B/L of knees             -patient may  Shower if knees covered  1/12- will get shower this AM             -ELOS/Goals: 10-14 days mod I  -Continue CIR 2.  Antithrombotics: -DVT/anticoagulation:  Pharmaceutical: Xarelto--will order dopplers due to elevated D dimer/hypoxia             -antiplatelet therapy: N/a 3. Pain Management: Continue tramadol prn.  Ice prn for local measures. Also gave her 50-100 mg q6 hours prn for tramadol             --Will trial low dose vicodin and monitor for side effects  1/18- will do Norco 1/2-1 tab - suggested 2-3x/day for therapy times only- then use tramadol otherwise. I think it will be OK since LFTs back to complete normal baseline.  1/19- started voltaren gel QID- and then con't norco- needs ot take with food for indigestion/nausea.   4. Mood: LCSW to follow for evaluation and support.              -antipsychotic agents: N/A 5. Neuropsych: This patient is capable of making decisions on her own behalf. 6. Skin/Wound Care: Monitor wounds for healing.  1/12- will remove IV- not needed; will remove surgical dressings tomorrow as long as didn't get wet/removed today.    1/13- original dressings off- knees look fantastic- con't daily nonstick dressings.   1/18- wrote to remove sutures- has 2-3.   1/19-  incisions look great 7. Fluids/Electrolytes/Nutrition: Monitor I/O. Electrolytes stable 1/12, repeat weekly.  8. PAF: Has history of intermittent palpitations (Dr. Harl Bowie). Well controlled, monitor HR tid. Encourage fluid intake.  9. ABLA: Hgb 10.7 on 1/12- monitor weekly                         --monitor for signs of bleeding.  10. Glucose intolerance: Recheck FBS in am.  1/12- BG this AM prior to food was 141- will double check to see if have HbA1c- last 1 01/18/20- is 5.1- so will just monitor 11. H/o depression: On Zoloft dialy.  12. Hypoxia: Order flutter valve for pulmonary hygiene             --wean oxygen  as able.             --Albuterol MDI prn SOB/wheezing. 13. IBS-diarrhea: Last BM 01/10 Continue colace for now.  1/19- LBM this AM- con't regimen 14. Mouth ulcer/sore/lesion  1/12- will start orojel prn for pt.  15. Elevated LFTs: tylenol discontinued. Tomorrow's labs changed to [facility]  1/17- LFTS are back to normal- was 54/116- now 25 and 39-   1/18- will allow low dose Norco since LFTs back to baseline.     LOS: 8 days A FACE TO FACE EVALUATION WAS PERFORMED  Melissa Velazquez 02/07/2020, 9:27 AM

## 2020-02-07 NOTE — Progress Notes (Signed)
Occupational Therapy Session Note  Patient Details  Name: Melissa Velazquez MRN: [id number] Date of Birth: 1950/07/06  Today's Date: 02/07/2020 OT Individual Time: 1100-1200 OT Individual Time Calculation (min): 60 min    Short Term Goals: Week 2:  OT Short Term Goal 2 (Week 2): STG=LTG 2/2 ELOS  Skilled Therapeutic Interventions/Progress Updates:   Pt received sitting in w/c with no c/o pain. Pt agreeable to OT session focused on shower level ADLs. Sister not present for session initially but arriving later in session. Discussed use of tub shower to simulate home set up, pt agreeable. Pt doffed all clothing sit > stand with min cueing for seated level LB distal doffing, CGA overall. Pt stood with CGA with RW and completed stand pivot transfer with supervision. Pt completed transfer to the TTB and into shower with CGA. Pt completed all bathing seated with use of LH sponge with supervision. Pt returned to the w/c with supervision. Good RW management throughout session. Pt donned UB clothing with mod I, LB with close supervision. Pt then completed 3 sets of reciprocal stepping activity without UE support to challenge dynamic standing balance and hip stability. At close of session pt in w/c with all needs met, sister present.   Therapy Documentation Precautions:  Precautions Precautions: Fall,Knee Precaution Comments: instructed no pillows under knee Restrictions Weight Bearing Restrictions: Yes RLE Weight Bearing: Weight bearing as tolerated LLE Weight Bearing: Weight bearing as tolerated Other Position/Activity Restrictions: WBAT  Therapy/Group: Individual Therapy  Curtis Sites 02/07/2020, 6:51 AM

## 2020-02-07 NOTE — Progress Notes (Signed)
Physical Therapy Weekly Progress Note  Patient Details  Name: Melissa Velazquez MRN: [id number] Date of Birth: 1950-07-28  Beginning of progress report period: January 30, 2020 End of progress report period: February 07, 2020  Today's Date: 02/07/2020 PT Individual Time: 1330-1440, 4627-0350 PT Individual Time Calculation (min): 70 min, 45 min  Patient has met 4 of 5 short term goals.  Pt is supervision to independent with bed mobility, transfers, and gait. Pt is able to ambulate 500 with supervision and RW.  Min A with dynamic balance tasks without an AD, ROM is improving, measure at L=104 deg, R= 94 deg. Pt underwent family education with sister to prepare for discharge home.   Patient continues to demonstrate the following deficits muscle weakness and muscle joint tightness, decreased cardiorespiratoy endurance and decreased standing balance and therefore will continue to benefit from skilled PT intervention to increase functional independence with mobility.  Patient progressing toward long term goals..  Continue plan of care.  PT Short Term Goals Week 1:  PT Short Term Goal 1 (Week 1): STG=LTG due to short ELOS. PT Short Term Goal 1 - Progress (Week 1): Progressing toward goal Week 2:  PT Short Term Goal 1 (Week 2): =LTG due to ELOS  Skilled Therapeutic Interventions/Progress Updates:  Ambulation/gait training;Discharge planning;DME/adaptive equipment instruction;Functional mobility training;Pain management;Psychosocial support;Splinting/orthotics;Therapeutic Activities;UE/LE Strength taining/ROM;Balance/vestibular training;Community reintegration;Disease management/prevention;Functional electrical stimulation;Neuromuscular re-education;Patient/family education;Skin care/wound management;Stair training;Therapeutic Exercise;UE/LE Coordination activities;Wheelchair propulsion/positioning   AM Session:  Pt received in bed. Supervision with lower body dressing, requiring infrequent verbal cues  to use her walker/walker bag when carrying items. Pt ambulated 200 ft with supervision and RW. Performed step ups with knee drive and bosu lunges in parallel bars. Pt tolerated manual stretching into knee flexion, gaining new range measured at L=104 deg, R= 94 deg. Pt returned to bed after session and was left with ice packs in place, all needs in reach, and bed alarm active.   PM session: Pt received in bed, states she feels "impinged upon" and is having difficulty with having so little privacy. Pt ambulated 150 ft with RW, supervision level. She demonstrated improving dynamic balance, with airex cone taps CGA, airex SLS with foot on ball and UUE CGA. She navigated an obstacle course  RW and supervision to CGA  Consisting of low obstacles to step over, cones, airex pad, 3" step, bending to pick up cups, and kicking a soccer ball.  Pt engaged in kicking a soccer ball x10 min and volley ball x 10 min with min A and no UE support in standing. Pt propelled WC with her legs 20 ft x 4 to race SPT for lower extremity strength. Pt states that she enjoyed session and appeared to have lifted spirits. Pt returned to room to rest in bed with ice packs in place, all needs in reach, and bed alarm active.   Therapy Documentation Precautions:  Precautions Precautions: Fall,Knee Precaution Comments: instructed no pillows under knee Restrictions Weight Bearing Restrictions: Yes RLE Weight Bearing: Weight bearing as tolerated LLE Weight Bearing: Weight bearing as tolerated Other Position/Activity Restrictions: WBAT   Therapy/Group: Individual Therapy  Sharen Counter 02/07/2020, 7:59 AM

## 2020-02-07 NOTE — Progress Notes (Signed)
Physical Therapy Session Note  Patient Details  Name: Melissa Velazquez MRN: [id number] Date of Birth: 1950-12-29  Today's Date: 02/07/2020 PT Individual Time: 1303-1330 PT Individual Time Calculation (min): 27 min   Short Term Goals: Week 1:  PT Short Term Goal 1 (Week 1): STG=LTG due to short ELOS. PT Short Term Goal 1 - Progress (Week 1): Progressing toward goal Week 2:  PT Short Term Goal 1 (Week 2): =LTG due to ELOS  Skilled Therapeutic Interventions/Progress Updates: Pt presented in w/c agreeable to therapy. Pt states 4/10 pain in B knees. Pt noted that she was unable to find shower caddy and thinks may have left it in shower room. Pt propelled to shower room for general conditioning. Pt and PTA was able to locate shower caddy once at shower room. Pt then ambulated to rehab gym with overall supervision and participated in ascending/descending x 4 steps with supervision. PTA explained that sister would obtain RW prior to pt ascending stairs and place at top of stairs. Pt then ambulated to 3 in steps and participated in forward and lateral step ups x 10 bilaterally with seated rest between bouts. After seated rest pt ambulated back to room with RW and supervision. Pt requesting to return to bed for break until next session. Performed sit to supine with supervision and PTA placed ice packs on B knees for pain management. Pt left in bed at end of session with bed alarm on, call bell within reach and needs met.      Therapy Documentation Precautions:  Precautions Precautions: Fall,Knee Precaution Comments: instructed no pillows under knee Restrictions Weight Bearing Restrictions: Yes RLE Weight Bearing: Weight bearing as tolerated LLE Weight Bearing: Weight bearing as tolerated Other Position/Activity Restrictions: WBAT General:   Vital Signs: Therapy Vitals Temp: 98.6 F (37 C) Pulse Rate: 78 Resp: 17 BP: 123/63 Patient Position (if appropriate): Lying Oxygen Therapy SpO2: 100  % O2 Device: Room Air Pain:   Mobility:   Locomotion :    Trunk/Postural Assessment :    Balance:   Exercises:   Other Treatments:      Therapy/Group: Individual Therapy  Briley Bumgarner 02/07/2020, 4:11 PM

## 2020-02-07 NOTE — Progress Notes (Signed)
Patient ID: Melissa Velazquez, female   DOB: 03-30-1950, 71 y.o.   MRN: [id number]  SW met with pt in room to provide [facility] list. SW to follow-up with pt to discuss preference.   Loralee Pacas, MSW, [place] Office: [phone] Cell: [phone] Fax: [phone]

## 2020-02-08 ENCOUNTER — Inpatient Hospital Stay (HOSPITAL_COMMUNITY): Payer: Medicare Other | Admitting: Physical Therapy

## 2020-02-08 ENCOUNTER — Inpatient Hospital Stay (HOSPITAL_COMMUNITY): Payer: Medicare Other

## 2020-02-08 ENCOUNTER — Inpatient Hospital Stay (HOSPITAL_COMMUNITY): Payer: Medicare Other | Admitting: Occupational Therapy

## 2020-02-08 MED ORDER — PANTOPRAZOLE SODIUM 20 MG PO TBEC
20.0000 mg | DELAYED_RELEASE_TABLET | Freq: Every day | ORAL | Status: DC
  Administered 2020-02-08 – 2020-02-11 (×4): 20 mg via ORAL
  Filled 2020-02-08 (×4): qty 1

## 2020-02-08 NOTE — Progress Notes (Signed)
Physical Therapy Session Note  Patient Details  Name: Melissa Velazquez MRN: [id number] Date of Birth: [birth year]  Today's Date: 02/08/2020 PT Individual Time: 6283-6629 PT Individual Time Calculation (min): 58 min   Short Term Goals: Week 2:  PT Short Term Goal 1 (Week 2): =LTG due to ELOS  Skilled Therapeutic Interventions/Progress Updates:   Pt received supine in bed and agreeable to PT. Supine>sit transfer without assist or cues from PT. Pt able to don shoes sitting EOB with set up assist only. Pt reports mild pain in Bil knees, but does not provide number. Gait training with RW x 9ft, 132ft, 69ft, and 75ft. Distant supervision A throughout gait training with only occasional cues for AD management prior to performing stand>sit tranfers. PT instructed pt in dynamic balance training with use of Nintendo Wii. Gross motor task of Wii bowling x 10 frames in standing with supervision assist. 3 near posterior LOB, but pt able to correct with cues. Wii fit table tilt and pengui slide 2 with min assist and moderate cues for use of ankle strategy to correct LOB and improve COM control. Pt attempting to use only hip strategy throughout unless min facilitation provided by PT Pt returned to room and performed stand pivot transfer to bed with no AD and supervision assist. Sit>supine completed without assist, and left supine in bed with call bell in reach and all needs met.        Therapy Documentation Precautions:  Precautions Precautions: Fall,Knee Precaution Comments: instructed no pillows under knee Restrictions Weight Bearing Restrictions: Yes RLE Weight Bearing: Weight bearing as tolerated LLE Weight Bearing: Weight bearing as tolerated Other Position/Activity Restrictions: WBAT  Vital Signs: Therapy Vitals Temp: 98.4 F (36.9 C) Temp Source: Oral Pulse Rate: 84 Resp: 18 BP: 101/88 Patient Position (if appropriate): Lying Oxygen Therapy SpO2: 100 % O2 Device: Room  Air    Therapy/Group: Individual Therapy  Lorie Phenix 02/08/2020, 4:27 PM

## 2020-02-08 NOTE — Progress Notes (Signed)
Patient ID: Melissa Velazquez, female   DOB: 08/09/1950, 70 y.o.   MRN: [id number]  SW met with pt in room to provide updates on change from [facility] to Outpatient. Pt prefers to have home health therapies as she is not able to drive, lives alone, and her sister is unable to drive her to/from these appointment since she lives 30 minutes away. SW explained unsure on how long she will have home health therapies due to her current progression her in CIR, however will put in request for [facility] services. Pt states she has a RW already.   SW sent HHPT/OT referral to [facility]. SW waiting on follow-up.   Loralee Pacas, MSW, [place] Office: [phone] Cell: [phone] Fax: [phone]

## 2020-02-08 NOTE — Progress Notes (Signed)
[place] PHYSICAL MEDICINE & REHABILITATION PROGRESS NOTE   Subjective/Complaints:  Pt reports the indigestion is an issue- discussed using PPI for prevention vs Mylanta if has sx's- decided will do low dose Protonix.    Always having pain 3-4/10- describes at tibial plateau B/L    ROS:  Pt denies SOB, abd pain, CP, N/V/C/D, and vision changes   Objective:   No results found. No results for input(s): WBC, HGB, HCT, PLT in the last 72 hours. No results for input(s): NA, K, CL, CO2, GLUCOSE, BUN, CREATININE, CALCIUM in the last 72 hours.  Intake/Output Summary (Last 24 hours) at 02/08/2020 0913 Last data filed at 02/08/2020 0838 Gross per 24 hour  Intake 735 ml  Output -  Net 735 ml        Physical Exam: Vital Signs Blood pressure 128/77, pulse 73, temperature (!) 97.5 F (36.4 C), temperature source Oral, resp. rate 18, height 5\' 4"  (1.626 m), weight 93.7 kg, SpO2 98 %. Gen: laying in bed- appropriate, OT in room, NAD HEENT: oral mucosa pink and moist, NCAT Cardio: RRR Chest: CTA B/L- no W/R/R- good air movement Abd: Soft, NT, ND, (+)BS - slightly TTP   Ext: no edema Psych: a little stressed, but not upset Musculoskeletal:     Cervical back: Normal range of motion. No rigidity.     Comments: UEs 5/5 in biceps, triceps, WE grip and finger abd LEs- 2/5 in HF B/L; KE/KF NT due to knee surgery, and DF and PF 5/5 B/L B/L knees look fantastic- clear tegaderm over B/L knees-  No erythema; no drainage- less swelling- looks even better daily; no fingernails on fingers Neurological:  Intact to light touch in all 4 extremities AOx3   Assessment/Plan: 1. Functional deficits which require 3+ hours per day of interdisciplinary therapy in a comprehensive inpatient rehab setting.  Physiatrist is providing close team supervision and 24 hour management of active medical problems listed below.  Physiatrist and rehab team continue to assess barriers to discharge/monitor patient  progress toward functional and medical goals  Care Tool:  Bathing    Body parts bathed by patient: Right arm,Left arm,Chest,Abdomen,Front perineal area,Buttocks,Right upper leg,Left upper leg,Right lower leg,Left lower leg,Face   Body parts bathed by helper: Buttocks,Right lower leg,Left lower leg     Bathing assist Assist Level: Set up assist     Upper Body Dressing/Undressing Upper body dressing   What is the patient wearing?: Pull over shirt,Bra    Upper body assist Assist Level: Independent    Lower Body Dressing/Undressing Lower body dressing      What is the patient wearing?: Underwear/pull up,Pants     Lower body assist Assist for lower body dressing: Supervision/Verbal cueing     Toileting Toileting    Toileting assist Assist for toileting: Contact Guard/Touching assist     Transfers Chair/bed transfer  Transfers assist     Chair/bed transfer assist level: Supervision/Verbal cueing Chair/bed transfer assistive device: Programmer, multimedia   Ambulation assist      Assist level: Supervision/Verbal cueing Assistive device: Walker-rolling Max distance: 128ft   Walk 10 feet activity   Assist     Assist level: Supervision/Verbal cueing Assistive device: Walker-rolling   Walk 50 feet activity   Assist    Assist level: Supervision/Verbal cueing Assistive device: Walker-rolling    Walk 150 feet activity   Assist Walk 150 feet activity did not occur: Safety/medical concerns (limited by pain/activity tolerance)  Assist level: Supervision/Verbal cueing Assistive device: Walker-rolling  Walk 10 feet on uneven surface  activity   Assist Walk 10 feet on uneven surfaces activity did not occur: Safety/medical concerns (limited by pain/activity tolerance)         Wheelchair     Assist Will patient use wheelchair at discharge?: No (patient progressing to a functional ambulator) Type of Wheelchair: Manual     Wheelchair assist level: Supervision/Verbal cueing      Wheelchair 50 feet with 2 turns activity    Assist        Assist Level: Supervision/Verbal cueing   Wheelchair 150 feet activity     Assist      Assist Level: Supervision/Verbal cueing   Blood pressure 128/77, pulse 73, temperature (!) 97.5 F (36.4 C), temperature source Oral, resp. rate 18, height 5\' 4"  (1.626 m), weight 93.7 kg, SpO2 98 %.   Medical Problem List and Plan: 1.  Impaired function secondary to B/L Knee arthroplasties due to end stage arthritis B/L of knees             -patient may  Shower if knees covered  1/12- will get shower this AM             -ELOS/Goals: 10-14 days mod I  -Continue CIR 2.  Antithrombotics: -DVT/anticoagulation:  Pharmaceutical: Xarelto--will order dopplers due to elevated D dimer/hypoxia             -antiplatelet therapy: N/a 3. Pain Management: Continue tramadol prn.  Ice prn for local measures. Also gave her 50-100 mg q6 hours prn for tramadol             --Will trial low dose vicodin and monitor for side effects  1/18- will do Norco 1/2-1 tab - suggested 2-3x/day for therapy times only- then use tramadol otherwise. I think it will be OK since LFTs back to complete normal baseline.  1/19- started voltaren gel QID- and then con't norco- needs ot take with food for indigestion/nausea.   1/20- con't regimen- pain 3-4/10 currently  4. Mood: LCSW to follow for evaluation and support.              -antipsychotic agents: N/A 5. Neuropsych: This patient is capable of making decisions on her own behalf. 6. Skin/Wound Care: Monitor wounds for healing.  1/12- will remove IV- not needed; will remove surgical dressings tomorrow as long as didn't get wet/removed today.    1/13- original dressings off- knees look fantastic- con't daily nonstick dressings.   1/18- wrote to remove sutures- has 2-3.   1/20- incisions look fantastic- con't dressings 7. Fluids/Electrolytes/Nutrition:  Monitor I/O. Electrolytes stable 1/12, repeat weekly.  8. PAF: Has history of intermittent palpitations (Dr. Harl Bowie). Well controlled, monitor HR tid. Encourage fluid intake.  9. ABLA: Hgb 10.7 on 1/12- monitor weekly                         --monitor for signs of bleeding.  10. Glucose intolerance: Recheck FBS in am.  1/12- BG this AM prior to food was 141- will double check to see if have HbA1c- last 1 01/18/20- is 5.1- so will just monitor 11. H/o depression: On Zoloft dialy.  12. Hypoxia: Order flutter valve for pulmonary hygiene             --wean oxygen as able.             --Albuterol MDI prn SOB/wheezing. 13. IBS-diarrhea: Last BM 01/10 Continue colace for now.  1/19- LBM this  AM- con't regimen 14. Mouth ulcer/sore/lesion  1/12- will start orojel prn for pt.  15. Elevated LFTs: tylenol discontinued. Tomorrow's labs changed to [facility]  1/17- LFTS are back to normal- was 54/116- now 25 and 39-   1/18- will allow low dose Norco since LFTs back to baseline.  16/ Indigestion/Reflux  1/20- will start Protonix 20 mg daily and con't mylanta prn- discussed concerns about PPI - like osteoporosis- only an issue long term.     LOS: 9 days A FACE TO FACE EVALUATION WAS PERFORMED  Melissa Velazquez 02/08/2020, 9:13 AM

## 2020-02-08 NOTE — Progress Notes (Signed)
Physical Therapy Session Note  Patient Details  Name: Melissa Velazquez MRN: [id number] Date of Birth: 06/04/1950  Today's Date: 02/08/2020 PT Individual Time: 1330-1430 PT Individual Time Calculation (min): 60 min   Short Term Goals: Week 2:  PT Short Term Goal 1 (Week 2): =LTG due to ELOS  Skilled Therapeutic Interventions/Progress Updates:   Patient received sitting up in wc, agreeable to PT. She denies pain, but reports "stiffness" in B knees, R >L. She was able to ambulate to bathroom with RW and supervision. Decreased B knee flexion noted with B trendelenburg as a result. Patient ambulating to therapy gym with with RW and supervision. Seated B heel slides to encourage increased ROM into flexion. Patient able to achieve ~20* greater flexion L knee vs R knee. In the parallel bars, patient completing hamstring curls with 3# ankle weights 3x12 B. Intermittent seated rest breaks due to fatigue/increasing soreness in B knees. PT providing education on TKR recovery and appropriate home modifications for safety. Patient ambulating back to room with RW and supervision transferring to bed. Bed alarm on, call light within reach.    Therapy Documentation Precautions:  Precautions Precautions: Fall,Knee Precaution Comments: instructed no pillows under knee Restrictions Weight Bearing Restrictions: Yes RLE Weight Bearing: Weight bearing as tolerated LLE Weight Bearing: Weight bearing as tolerated Other Position/Activity Restrictions: WBAT    Therapy/Group: Individual Therapy  Karoline Caldwell, PT, DPT, CBIS  02/08/2020, 7:39 AM

## 2020-02-08 NOTE — Progress Notes (Signed)
Occupational Therapy Session Note  Patient Details  Name: Melissa Velazquez MRN: [id number] Date of Birth: 1950/11/28  Today's Date: 02/08/2020 OT Individual Time: 1000-1045 OT Individual Time Calculation (min): 45 min    Short Term Goals: Week 1:  OT Short Term Goal 1 (Week 1): STG=LTG d/t ELOS OT Short Term Goal 1 - Progress (Week 1): Progressing toward goal Week 2:  OT Short Term Goal 2 (Week 2): STG=LTG 2/2 ELOS  Skilled Therapeutic Interventions/Progress Updates:    Met with pt to discuss d/c plans and things to think about as she lives alone with intermittent A from her sister.  Discussed handling the trash, transporting laundry, lifting dog food.    Pt then completed oral care standing at sink, bathing and dressing all with S. Ambulated with RW to bathroom with S and then used toilet with mod I.    Pt is doing extremely well with self care and will need to work on dynamic balance for more Ind with IADLs.  Pt resting in wc with all needs met.  Therapy Documentation Precautions:  Precautions Precautions: Fall,Knee Precaution Comments: instructed no pillows under knee Restrictions Weight Bearing Restrictions: Yes RLE Weight Bearing: Weight bearing as tolerated LLE Weight Bearing: Weight bearing as tolerated Other Position/Activity Restrictions: WBAT      Pain: no c/o pain during OT session      Therapy/Group: Individual Therapy  [place] 02/08/2020, 1:15 PM

## 2020-02-08 NOTE — Progress Notes (Signed)
Physical Therapy Session Note  Patient Details  Name: Melissa Velazquez MRN: [id number] Date of Birth: September 19, 1950  Today's Date: 02/08/2020 PT Individual Time: 0805-0850 PT Individual Time Calculation (min): 45 min   Short Term Goals: Week 2:  PT Short Term Goal 1 (Week 2): =LTG due to ELOS  Skilled Therapeutic Interventions/Progress Updates: Pt presented in bed agreeable to therapy. Pt c/o pain 3-4/10 in B knees. MD present for am assessment. Pt then performed bed mobiltiy with supervision and use of bed features. Pt abulated to day room and participated in Nu Step L3 x 5:30 for warm up and knee ROM. Pt then ambulated to high/low mat and performed seated and supine therex as follows. LAQ with 3 sec hold x 15 bilaterally, quad set in long sit with biofeedback behind knee with 3 sec hold x 10 bilaterally, SLR 2 x 10 bilaterally with pt demonstrating mild extension lag, sidelying hip abd and clamshells 2 x 10 bilaterally. Pt then ambulated back to room and agreeable to sit in w/c. Pt left in w/c at end of session with call bell within reach, ice pack on B knees, and needs met.      Therapy Documentation Precautions:  Precautions Precautions: Fall,Knee Precaution Comments: instructed no pillows under knee Restrictions Weight Bearing Restrictions: Yes RLE Weight Bearing: Weight bearing as tolerated LLE Weight Bearing: Weight bearing as tolerated Other Position/Activity Restrictions: WBAT General:   Vital Signs: Therapy Vitals Temp: 98.4 F (36.9 C) Temp Source: Oral Pulse Rate: 84 Resp: 18 BP: 101/88 Patient Position (if appropriate): Lying Oxygen Therapy SpO2: 100 % O2 Device: Room Air  Therapy/Group: Individual Therapy  Vanna Sailer  Keela Rubert, PTA  02/08/2020, 3:42 PM

## 2020-02-09 ENCOUNTER — Inpatient Hospital Stay (HOSPITAL_COMMUNITY): Payer: Medicare Other

## 2020-02-09 ENCOUNTER — Inpatient Hospital Stay (HOSPITAL_COMMUNITY): Payer: Medicare Other | Admitting: Physical Therapy

## 2020-02-09 ENCOUNTER — Inpatient Hospital Stay (HOSPITAL_COMMUNITY): Payer: Medicare Other | Admitting: Occupational Therapy

## 2020-02-09 DIAGNOSIS — D62 Acute posthemorrhagic anemia: Secondary | ICD-10-CM

## 2020-02-09 NOTE — Progress Notes (Signed)
Physical Therapy Session Note  Patient Details  Name: Melissa Velazquez MRN: [id number] Date of Birth: 1950-10-11  Today's Date: 02/09/2020 PT Individual Time: 0805-0900 PT Individual Time Calculation (min): 55 min   Short Term Goals: Week 2:  PT Short Term Goal 1 (Week 2): =LTG due to ELOS  Skilled Therapeutic Interventions/Progress Updates: Pt presented in bed agreeable to therapy. Pt states mild pain in knees with AROM during therapy. Premedicated and rest breaks provided as needed. Performed bed mobility mod I with light use of bed features. Pt donned clothes at EOB with supervision and performed STS with supervision to pull pants over hips. MD present for daily assessment.  Pt then ambulated to sink with supervision and performed oral hygiene and washed face mod I. Pt then ambulated to day room with RW and supervision and participated in NuStel L3 x 6 min for warm up and ROM. Pt then ambulated to day room and participated in set up and creation of HEP. Pt participated in following exercises, LAQ, SAQ in sitting, standing hamstring curls, side stepping with level 3 resistance band (above knees), and knee flexion stretch. After stretch pt checked for ROM L: AROM 100, PROM 109, R: AROM 95, PROM 105. Pt ambulated back to room at end of session and agreeable to sit in w/c. Pt left in w/c with call bell within reach and needs met.   Access Code: [email or web address] URL: [email or web address] Date: 02/09/2020 Prepared by: Karlyne Greenspan Jamonte Curfman  Exercises Supine Quad Set - 1 x daily - 7 x weekly - 3 sets - 10 reps Seated Long Arc Quad - 1 x daily - 7 x weekly - 3 sets - 10 reps Straight Leg Raise - 1 x daily - 7 x weekly - 3 sets - 10 reps Sidelying Hip Abduction - 1 x daily - 7 x weekly - 3 sets - 10 reps Seated Short Arc Quad - 1 x daily - 7 x weekly - 3 sets - 10 reps Standing Knee Flexion AROM with Chair Support - 1 x daily - 7 x weekly - 3 sets - 10 reps Side Stepping with Resistance at Thighs -  1 x daily - 7 x weekly - 3 sets - 10 reps      Therapy Documentation Precautions:  Precautions Precautions: Fall,Knee Precaution Comments: instructed no pillows under knee Restrictions Weight Bearing Restrictions: No RLE Weight Bearing: Weight bearing as tolerated LLE Weight Bearing: Weight bearing as tolerated Other Position/Activity Restrictions: WBAT General:   Vital Signs: Therapy Vitals Temp: 98.2 F (36.8 C) Pulse Rate: 76 Resp: 17 BP: 116/82 Patient Position (if appropriate): Sitting Oxygen Therapy SpO2: 98 % O2 Device: Room Air Pain:     Therapy/Group: Individual Therapy  Shandell Giovanni  Fumio Vandam, PTA  02/09/2020, 4:15 PM

## 2020-02-09 NOTE — Progress Notes (Signed)
Patient ID: Melissa Velazquez, female   DOB: 04-17-1950, 70 y.o.   MRN: [id number]   SW received updates from [facility] reporting referral for HHPT/OT accepted. SW faxed order to [facility] [phone].  SW met with pt in room to provide above updates.   Loralee Pacas, MSW, [place] Office: [phone] Cell: [phone] Fax: [phone]

## 2020-02-09 NOTE — Progress Notes (Signed)
Physical Therapy Session Note  Patient Details  Name: Melissa Velazquez MRN: [id number] Date of Birth: 04-28-1950  Today's Date: 02/09/2020 PT Individual Time: 1435-1530 PT Individual Time Calculation (min): 55 min   Short Term Goals: Week 2:  PT Short Term Goal 1 (Week 2): =LTG due to ELOS  Skilled Therapeutic Interventions/Progress Updates:     Pt received seated in [facility] and agrees to therapy, rates pain 2/10. WC transport to gym for time management. Pt performs sit to stand with mod(I) and RW. Pt ambulates 100' with RW and close supervision with cues for upright gaze to improve posture and balance. Pt then performs NMR for standing balance and Bilateral lower extremity strength, performing toe taps 3x10 with alternating legs laterally, postero-laterally, and posteriorly. Following seated rest break pt performs again with cues to keep knee slightly flexed to engage leg musculature. Pt has increased difficulty performing during stance phase on the L, requiring minA from PT for balance. PT educates pt on ambulating with decreased WB through RW for energy conservation and increased strengthening in legs. Pt ambulates 250' with RW and supervision back to room. Left seated in WC with alarm intact and all needs within reach.  Therapy Documentation Precautions:  Precautions Precautions: Fall,Knee Precaution Comments: instructed no pillows under knee Restrictions Weight Bearing Restrictions: No RLE Weight Bearing: Weight bearing as tolerated LLE Weight Bearing: Weight bearing as tolerated Other Position/Activity Restrictions: WBAT    Therapy/Group: Individual Therapy  Breck Coons, PT, DPT 02/09/2020, 3:47 PM

## 2020-02-09 NOTE — Progress Notes (Signed)
Occupational Therapy Session Note  Patient Details  Name: Melissa Velazquez MRN: [id number] Date of Birth: [birth year]  Today's Date: 02/09/2020 OT Individual Time: 4098-1191 OT Individual Time Calculation (min): 60 min    Short Term Goals: Week 1:  OT Short Term Goal 1 (Week 1): STG=LTG d/t ELOS OT Short Term Goal 1 - Progress (Week 1): Progressing toward goal Week 2:  OT Short Term Goal 2 (Week 2): STG=LTG 2/2 ELOS  Skilled Therapeutic Interventions/Progress Updates:      Pt seen for BADL retraining of toileting, bathing at shower level, and dressing with a focus on balance and activity tolerance. Pt was able to complete all self care at a mod I level with distant S for ambulating in and out of bathroom with RW.  Pt then ambulated all the way to ADL apartment with RW. She was able to sit down and stand up from low leather couch. In kitchen, practiced reaching for pots and pans from low cabinets with and without use of RW.  Without RW, pt did really well just using the cabinets for support.   Discussed strategies for easy to prepare meals.   At kitchen sink, reviewed daily standing exercises which pt practiced (squats, heel lifts and heel raises).  Pt then ambulated back to her room, demonstrating good endurance.   Pt resting in wc with all needs met.    Therapy Documentation Precautions:  Precautions Precautions: Fall,Knee Precaution Comments: instructed no pillows under knee Restrictions Weight Bearing Restrictions: No RLE Weight Bearing: Weight bearing as tolerated LLE Weight Bearing: Weight bearing as tolerated Other Position/Activity Restrictions: WBAT    Pain: Pain Assessment Pain Score: 2  - B knees - using ice packs ADL: ADL Eating: Independent Where Assessed-Eating: Chair Grooming: Independent Where Assessed-Grooming: Standing at sink Upper Body Bathing: Independent Where Assessed-Upper Body Bathing: Shower Lower Body Bathing: Modified independent Where  Assessed-Lower Body Bathing: Shower Upper Body Dressing: Independent Where Assessed-Upper Body Dressing: Chair Lower Body Dressing: Modified independent Where Assessed-Lower Body Dressing: Chair Toileting: Modified independent Where Assessed-Toileting: Glass blower/designer: Distant supervision Armed forces technical officer Method: Magazine features editor: Distant supervision Social research officer, government Method: Heritage manager: Transfer tub bench,Grab bars   Therapy/Group: Individual Therapy  [place] 02/09/2020, 12:30 PM

## 2020-02-09 NOTE — Discharge Summary (Signed)
Physician Discharge Summary  Patient ID: Melissa Velazquez MRN: [id number] DOB/AGE: [birth year] 70 y.o.  Admit date: 01/30/2020 Discharge date: 02/11/2020  Discharge Diagnoses:  Principal Problem:   OA (osteoarthritis) of knee Active Problems:   GERD   Hypothyroidism   Acute blood loss anemia   Discharged Condition: Stable  Significant Diagnostic Studies:  VAS [place] LOWER EXTREMITY VENOUS (DVT)  Result Date: 02/02/2020  Lower Venous DVT Study Indications: Post op bilateral knee replacements, rehabilitation.  Comparison Study: No prior Performing Technologist: Sharion Dove RVS  Examination Guidelines: A complete evaluation includes B-mode imaging, spectral Doppler, color Doppler, and power Doppler as needed of all accessible portions of each vessel. Bilateral testing is considered an integral part of a complete examination. Limited examinations for reoccurring indications may be performed as noted. The reflux portion of the exam is performed with the patient in reverse Trendelenburg.  +---------+---------------+---------+-----------+----------+--------------+ RIGHT    CompressibilityPhasicitySpontaneityPropertiesThrombus Aging +---------+---------------+---------+-----------+----------+--------------+ CFV      Full           Yes      Yes                                 +---------+---------------+---------+-----------+----------+--------------+ SFJ      Full                                                        +---------+---------------+---------+-----------+----------+--------------+ FV Prox  Full                                                        +---------+---------------+---------+-----------+----------+--------------+ FV Mid   Full                                                        +---------+---------------+---------+-----------+----------+--------------+ FV DistalFull                                                         +---------+---------------+---------+-----------+----------+--------------+ PFV      Full                                                        +---------+---------------+---------+-----------+----------+--------------+ POP      Full           Yes      Yes                                 +---------+---------------+---------+-----------+----------+--------------+ PTV      Full                                                        +---------+---------------+---------+-----------+----------+--------------+  PERO     Full                                                        +---------+---------------+---------+-----------+----------+--------------+   +---------+---------------+---------+-----------+----------+--------------+ LEFT     CompressibilityPhasicitySpontaneityPropertiesThrombus Aging +---------+---------------+---------+-----------+----------+--------------+ CFV      Full           Yes      Yes                                 +---------+---------------+---------+-----------+----------+--------------+ SFJ      Full                                                        +---------+---------------+---------+-----------+----------+--------------+ FV Prox  Full                                                        +---------+---------------+---------+-----------+----------+--------------+ FV Mid   Full                                                        +---------+---------------+---------+-----------+----------+--------------+ FV DistalFull                                                        +---------+---------------+---------+-----------+----------+--------------+ PFV      Full                                                        +---------+---------------+---------+-----------+----------+--------------+ POP      Full           Yes      Yes                                  +---------+---------------+---------+-----------+----------+--------------+ PTV      Full                                                        +---------+---------------+---------+-----------+----------+--------------+ PERO     Full                                                        +---------+---------------+---------+-----------+----------+--------------+  Summary: BILATERAL: - No evidence of deep vein thrombosis seen in the lower extremities, bilaterally. -   *See table(s) above for measurements and observations. Electronically signed by Jamelle Haring on 02/02/2020 at 3:00:18 PM.    Final     Labs:  Basic Metabolic Panel: BMP Latest Ref Rng & Units 02/05/2020 01/31/2020 01/26/2020  Glucose 70 - 99 mg/dL 99 141(H) 108(H)  BUN 8 - 23 mg/dL 11 13 16   Creatinine 0.44 - 1.00 mg/dL 0.90 0.97 0.87  Sodium 135 - 145 mmol/L 141 136 141  Potassium 3.5 - 5.1 mmol/L 4.8 3.7 4.1  Chloride 98 - 111 mmol/L 103 98 107  CO2 22 - 32 mmol/L 25 27 26   Calcium 8.9 - 10.3 mg/dL 9.3 8.3(L) 8.6(L)    CBC: CBC Latest Ref Rng & Units 02/05/2020 01/31/2020 01/29/2020  WBC 4.0 - 10.5 K/uL 5.5 7.7 8.4  Hemoglobin 12.0 - 15.0 g/dL 10.6(L) 10.7(L) 10.1(L)  Hematocrit 36.0 - 46.0 % 34.2(L) 31.7(L) 31.2(L)  Platelets 150 - 400 K/uL 292 259 161    CBG: No results for input(s): GLUCAP in the last 168 hours.  Brief HPI:   Melissa Velazquez is a 70 y.o. female with history of HTN, mild asthma, IBS-diarrhea, bilateral knee OA with failure of conservative therapy.  She elected to undergo bilateral total knee replacement on 01/055 Dr. Reynaldo Minium.  Postop care was significant for acute blood loss anemia as well as transient thrombocytopenia.  She did develop an episode of A. fib with RVR and converted to NSR with IV metoprolol.  Hospitalist following for input and recommended continuing Xarelto due to CHA2DS2-VASc score 4 and to follow-up with cardiology for further input.  Therapy was ongoing and patient was noted to  be limited by weakness as well as pain which was affecting mobility and ADLs.  CIR was recommended due to functional decline.   Hospital Course: [name] was admitted to rehab 01/30/2020 for inpatient therapies to consist of PT and OT at least three hours five days a week. Past admission physiatrist, therapy team and rehab RN have worked together to provide customized collaborative inpatient rehab.  Blood pressures have been stable and heart rate has been controlled.  She was educated on importance of pulmonary hygiene and was weaned off oxygen.  Respiratory status is stable.  Colace was added to help manage OIC.  Bilateral knee pain was managed with use of hydrocodone for severe pain and tramadol as needed for moderate pain.  As pain control improved hydrocodone was discontinued and pain is currently managed on tramadol alone. Protonix was added to help manage GERD symptoms.    BLE Dopplers done past admission and were negative for DVT.  She was maintained on Xarelto however expressed concerns about cost of this and wanted an alternative anticoagulation.  This was changed to Lovenox once a day for a week to be followed by ASA x3 weeks per Ortho protocol.  Follow-up check of labs showed abnormal LFTs which have resolved.  Follow-up CBC shows acute blood loss anemia to be slowly improving.  Check of electrolytes within normal limits.  Bilateral knee incisions C/D/I healing well without any signs or symptoms of infection.  She has made good gains during her stay in rehab and is currently at modified independent level.  She will continue to receive further follow-up home health PT by advanced home care past discharge.   Rehab course: During patient's stay in rehab weekly team conferences were held to monitor patient's progress, set  goals and discuss barriers to discharge. At admission, patient required min assist with ADL tasks and min assist with mobility.  She  has had improvement in activity tolerance,  balance, postural control as well as ability to compensate for deficits.  She is able to complete ADL tasks at modified independent level. She is modified independent for transfers and to ambulate   Disposition: Home  Diet: Regular.   Special Instructions: 1. No driving or strenuous activity till cleared by MD. 2.  Follow up with cardiology for input on AC/PAF.   Allergies as of 02/11/2020      Reactions   Triple Antibiotic [bacitracin-neomycin-polymyxin] Anaphylaxis   Levofloxacin Other (See Comments)   Stomach upset   Oxycodone    Tremors and brain fog   Capsaicin Rash   Elastic Bandages & [zinc] Rash   Nickel Itching, Rash      Medication List    STOP taking these medications   azelastine 0.1 % nasal spray Commonly known as: ASTELIN   levocetirizine 5 MG tablet Commonly known as: XYZAL   methocarbamol 500 MG tablet Commonly known as: ROBAXIN   oxyCODONE 5 MG immediate release tablet Commonly known as: Oxy IR/ROXICODONE   rivaroxaban 10 MG Tabs tablet Commonly known as: XARELTO     TAKE these medications   albuterol 108 (90 Base) MCG/ACT inhaler Commonly known as: VENTOLIN HFA Inhale 2 puffs into the lungs every 6 (six) hours as needed for wheezing or shortness of breath.   aspirin 81 MG EC tablet Take 1 tablet (81 mg total) by mouth daily. Start on Jan 30th and use for 21 days Start taking on: February 18, 2020 Notes to patient: Need to purchase this over the counter--for use after lovenox completed to prevent blood clots   cetirizine 10 MG tablet Commonly known as: ZYRTEC Take 10 mg by mouth daily as needed for allergies.   diclofenac Sodium 1 % Gel Commonly known as: VOLTAREN Apply 4 g topically 4 (four) times daily.   docusate sodium 100 MG capsule Commonly known as: COLACE Take 1 capsule (100 mg total) by mouth 2 (two) times daily. Notes to patient: Purchase over the counter--stool softner   enoxaparin 40 MG/0.4ML injection Commonly known as:  LOVENOX Inject 0.4 mLs (40 mg total) into the skin daily. Notes to patient: Use this daily till gone.   fluticasone 50 MCG/ACT nasal spray Commonly known as: FLONASE Place 1 spray into both nostrils 2 (two) times daily as needed for allergies or rhinitis.   gabapentin 300 MG capsule Commonly known as: NEURONTIN Take a 300 mg capsule three times a day till 01/26. Then decrease to 300 mg capsule two times a day for two weeks. Then take a 300 mg capsule once a day for two weeks. Then discontinue. What changed: additional instructions   levothyroxine 75 MCG tablet Commonly known as: SYNTHROID Take 75 mcg by mouth daily before breakfast.   nystatin 100000 UNIT/ML suspension Commonly known as: MYCOSTATIN Take 5 mLs (500,000 Units total) by mouth 4 (four) times daily. Swish and swallow Notes to patient: For thrush/yeast   ondansetron 4 MG tablet Commonly known as: ZOFRAN Take 1 tablet (4 mg total) by mouth every 6 (six) hours as needed for nausea.   pantoprazole 20 MG tablet Commonly known as: PROTONIX Take 1 tablet (20 mg total) by mouth daily.   polyethylene glycol 17 g packet Commonly known as: MIRALAX / GLYCOLAX Take 17 g by mouth daily as needed for mild constipation.  sertraline 100 MG tablet Commonly known as: ZOLOFT Take 150 mg by mouth daily with breakfast.   traMADol 50 MG tablet Commonly known as: ULTRAM Take 1-2 tablets (50-100 mg total) by mouth every 6 (six) hours as needed for severe pain. What changed: reasons to take this Notes to patient: Was called in to pharmacy.    triamcinolone 0.1 % Commonly known as: KENALOG Apply 1 application topically daily as needed (rash).       Follow-up Information    Lovorn, Jinny Blossom, MD Follow up.   Specialty: Physical Medicine and Rehabilitation Why: as needed Contact information: [id number] [place] [address] [phone]        [name], MD. Call.   Specialty: Family Medicine Why: for  post hospital follow up Contact information: [address] [place] [id number] [phone]        Gaynelle Arabian, MD. Call.   Specialty: Orthopedic Surgery Why: for post op appointment Contact information: [address] [place] [address] [phone]               Signed: Bary Leriche 02/12/2020, 4:46 PM

## 2020-02-09 NOTE — Progress Notes (Signed)
[facility] PHYSICAL MEDICINE & REHABILITATION PROGRESS NOTE   Subjective/Complaints:  Ppt reports lips are bothering her still- wasn't sure if stil had sores- used to be upper lips, now B/L.   Nose running a lot.   Pain the same.  Concerned that insurance/pharmacy told her Xarelto would be almost $400.     ROS: Pt denies SOB, abd pain, CP, N/V/C/D, and vision changes   Objective:   No results found. No results for input(s): WBC, HGB, HCT, PLT in the last 72 hours. No results for input(s): NA, K, CL, CO2, GLUCOSE, BUN, CREATININE, CALCIUM in the last 72 hours.  Intake/Output Summary (Last 24 hours) at 02/09/2020 0842 Last data filed at 02/09/2020 0724 Gross per 24 hour  Intake 480 ml  Output --  Net 480 ml        Physical Exam: Vital Signs Blood pressure 137/83, pulse 67, temperature 98.1 F (36.7 C), temperature source Oral, resp. rate 18, height 5\' 4"  (1.626 m), weight 93.7 kg, SpO2 97 %. Gen: awake, alert, sitting EOB, appropriate, NAD HEENT: no abscesses/lesions on inner lips B/L- but lips appear somewhat swollen upper and lower Cardio: RRR Chest: CTA B/L- no W/R/R- good air movement Abd: Soft, NT, ND, (+)BS   Ext: no edema Psych: stressed over Xarelto Musculoskeletal:     Cervical back: Normal range of motion. No rigidity.     Comments: UEs 5/5 in biceps, triceps, WE grip and finger abd LEs- 2/5 in HF B/L; KE/KF NT due to knee surgery, and DF and PF 5/5 B/L B/L knees look fantastic- clear tegaderm over B/L knees-  No erythema; no drainage- less swelling- looks even better daily; no fingernails on fingers Neurological:  Intact to light touch in all 4 extremities AOx3   Assessment/Plan: 1. Functional deficits which require 3+ hours per day of interdisciplinary therapy in a comprehensive inpatient rehab setting.  Physiatrist is providing close team supervision and 24 hour management of active medical problems listed below.  Physiatrist and rehab team  continue to assess barriers to discharge/monitor patient progress toward functional and medical goals  Care Tool:  Bathing    Body parts bathed by patient: Right arm,Left arm,Chest,Abdomen,Front perineal area,Buttocks,Right upper leg,Left upper leg,Right lower leg,Left lower leg,Face   Body parts bathed by helper: Buttocks,Right lower leg,Left lower leg     Bathing assist Assist Level: Set up assist     Upper Body Dressing/Undressing Upper body dressing   What is the patient wearing?: Pull over shirt,Bra    Upper body assist Assist Level: Independent    Lower Body Dressing/Undressing Lower body dressing      What is the patient wearing?: Underwear/pull up,Pants     Lower body assist Assist for lower body dressing: Supervision/Verbal cueing     Toileting Toileting    Toileting assist Assist for toileting: Independent with assistive device     Transfers Chair/bed transfer  Transfers assist     Chair/bed transfer assist level: Supervision/Verbal cueing Chair/bed transfer assistive device: Programmer, multimedia   Ambulation assist      Assist level: Supervision/Verbal cueing Assistive device: Walker-rolling Max distance: 186ft   Walk 10 feet activity   Assist     Assist level: Supervision/Verbal cueing Assistive device: Walker-rolling   Walk 50 feet activity   Assist    Assist level: Supervision/Verbal cueing Assistive device: Walker-rolling    Walk 150 feet activity   Assist Walk 150 feet activity did not occur: Safety/medical concerns (limited by pain/activity tolerance)  Assist level: Supervision/Verbal cueing Assistive device: Walker-rolling    Walk 10 feet on uneven surface  activity   Assist Walk 10 feet on uneven surfaces activity did not occur: Safety/medical concerns (limited by pain/activity tolerance)         Wheelchair     Assist Will patient use wheelchair at discharge?: No (patient progressing to a  functional ambulator) Type of Wheelchair: Manual    Wheelchair assist level: Supervision/Verbal cueing      Wheelchair 50 feet with 2 turns activity    Assist        Assist Level: Supervision/Verbal cueing   Wheelchair 150 feet activity     Assist      Assist Level: Supervision/Verbal cueing   Blood pressure 137/83, pulse 67, temperature 98.1 F (36.7 C), temperature source Oral, resp. rate 18, height 5\' 4"  (1.626 m), weight 93.7 kg, SpO2 97 %.   Medical Problem List and Plan: 1.  Impaired function secondary to B/L Knee arthroplasties due to end stage arthritis B/L of knees             -patient may  Shower if knees covered  1/12- will get shower this AM             -ELOS/Goals: 10-14 days mod I  -Continue CIR 2.  Antithrombotics: -DVT/anticoagulation:  Pharmaceutical: Xarelto--will order dopplers due to elevated D dimer/hypoxia 1/21- will see if have ability to put on Lovenox since she said Xarelto costs too much- will check on that AND if can do lovenox?             -antiplatelet therapy: N/a 3. Pain Management: Continue tramadol prn.  Ice prn for local measures. Also gave her 50-100 mg q6 hours prn for tramadol             --Will trial low dose vicodin and monitor for side effects  1/18- will do Norco 1/2-1 tab - suggested 2-3x/day for therapy times only- then use tramadol otherwise. I think it will be OK since LFTs back to complete normal baseline.  1/19- started voltaren gel QID- and then con't norco- needs ot take with food for indigestion/nausea.   1/20- con't regimen- pain 3-4/10 currently  4. Mood: LCSW to follow for evaluation and support.              -antipsychotic agents: N/A 5. Neuropsych: This patient is capable of making decisions on her own behalf. 6. Skin/Wound Care: Monitor wounds for healing.  1/12- will remove IV- not needed; will remove surgical dressings tomorrow as long as didn't get wet/removed today.    1/13- original dressings off-  knees look fantastic- con't daily nonstick dressings.   1/21- incisions look great- con't dressings 7. Fluids/Electrolytes/Nutrition: Monitor I/O. Electrolytes stable 1/12, repeat weekly.  8. PAF: Has history of intermittent palpitations (Dr. Harl Bowie). Well controlled, monitor HR tid. Encourage fluid intake.  9. ABLA: Hgb 10.7 on 1/12- monitor weekly                         --monitor for signs of bleeding.  10. Glucose intolerance: Recheck FBS in am.  1/12- BG this AM prior to food was 141- will double check to see if have HbA1c- last 1 01/18/20- is 5.1- so will just monitor 11. H/o depression: On Zoloft dialy.  12. Hypoxia: Order flutter valve for pulmonary hygiene             --wean oxygen as able.             --  Albuterol MDI prn SOB/wheezing. 13. IBS-diarrhea: Last BM 01/10 Continue colace for now.  1/19- LBM this AM- con't regimen 14. Mouth ulcer/sore/lesion  1/12- will start orojel prn for pt.   1/21- mouth lesions gone, but lips a little swollen- con't orojel 15. Elevated LFTs: tylenol discontinued. Tomorrow's labs changed to [facility]  1/17- LFTS are back to normal- was 54/116- now 25 and 39-   1/18- will allow low dose Norco since LFTs back to baseline.  16/ Indigestion/Reflux  1/20- will start Protonix 20 mg daily and con't mylanta prn- discussed concerns about PPI - like osteoporosis- only an issue long term.   1/21- doing as little better- con't regimen  17. Nasal congestion  1/21- will order flonase- has a bottle from [facility] 1 spray each nostril daily.   LOS: 10 days A FACE TO FACE EVALUATION WAS PERFORMED  Jacek Colson 02/09/2020, 8:42 AM

## 2020-02-10 ENCOUNTER — Inpatient Hospital Stay (HOSPITAL_COMMUNITY): Payer: Medicare Other | Admitting: Physical Therapy

## 2020-02-10 ENCOUNTER — Inpatient Hospital Stay (HOSPITAL_COMMUNITY): Payer: Medicare Other | Admitting: Occupational Therapy

## 2020-02-10 MED ORDER — TRAMADOL HCL 50 MG PO TABS: 50.0000 mg | ORAL_TABLET | Freq: Four times a day (QID) | ORAL | 0 refills | Status: DC | PRN

## 2020-02-10 MED ORDER — ENOXAPARIN (LOVENOX) PATIENT EDUCATION KIT
PACK | Freq: Once | Status: AC
  Filled 2020-02-10 (×2): qty 1

## 2020-02-10 MED ORDER — NYSTATIN 100000 UNIT/ML MT SUSP
5.0000 mL | Freq: Four times a day (QID) | OROMUCOSAL | Status: DC
  Administered 2020-02-10 – 2020-02-11 (×4): 500000 [IU] via ORAL
  Filled 2020-02-10 (×4): qty 5

## 2020-02-10 MED ORDER — GABAPENTIN 300 MG PO CAPS: ORAL_CAPSULE | ORAL | 0 refills | Status: DC

## 2020-02-10 MED ORDER — ENOXAPARIN SODIUM 40 MG/0.4ML ~~LOC~~ SOLN
40.0000 mg | SUBCUTANEOUS | Status: DC
  Administered 2020-02-11: 40 mg via SUBCUTANEOUS
  Filled 2020-02-10: qty 0.4

## 2020-02-10 MED ORDER — ASPIRIN 81 MG PO TBEC: 81.0000 mg | DELAYED_RELEASE_TABLET | Freq: Every day | ORAL | 11 refills | Status: DC

## 2020-02-10 MED ORDER — ENOXAPARIN SODIUM 40 MG/0.4ML ~~LOC~~ SOLN: 40.0000 mg | SUBCUTANEOUS | 0 refills | Status: DC

## 2020-02-10 MED ORDER — PANTOPRAZOLE SODIUM 20 MG PO TBEC: 20.0000 mg | DELAYED_RELEASE_TABLET | Freq: Every day | ORAL | 0 refills | Status: DC

## 2020-02-10 MED ORDER — DOCUSATE SODIUM 100 MG PO CAPS: 100.0000 mg | ORAL_CAPSULE | Freq: Two times a day (BID) | ORAL | 0 refills | Status: DC

## 2020-02-10 MED ORDER — DICLOFENAC SODIUM 1 % EX GEL: 4.0000 g | Freq: Four times a day (QID) | CUTANEOUS | 0 refills | Status: DC

## 2020-02-10 NOTE — Progress Notes (Signed)
Occupational Therapy Session Note  Patient Details  Name: Melissa Velazquez MRN: [id number] Date of Birth: [birth year]  Today's Date: 02/10/2020 OT Individual Time: 1001-1059 OT Individual Time Calculation (min): 58 min   Short Term Goals: Week 1:  OT Short Term Goal 1 (Week 1): STG=LTG d/t ELOS OT Short Term Goal 1 - Progress (Week 1): Progressing toward goal  Skilled Therapeutic Interventions/Progress Updates:    Pt greeted via PT handoff, no c/o pain and agreeable to shower. Pt gathered her needed ADLs items using RW in the room with cue to initiate. She bathed using the TTB with modified independence and then proceeded with dressing tasks sit<stand using device. Discussed IADL roles and responsibilities at home. She reports she already practiced laundry and meal prep tasks with OT previously, felt comfortable engaging in these tasks at Mod I level at home and had no other questions. Pt then engaged in homemaking task of stripping linen from her bed and applying clean linen, pt using RW for standing support and reaching outside of base of support for higher level balance challenges. Pt remained seated EOB at time of departure, Mod I sign above bed.    Therapy Documentation Precautions:  Precautions Precautions: Fall,Knee Precaution Comments: instructed no pillows under knee Restrictions Weight Bearing Restrictions: No RLE Weight Bearing: Weight bearing as tolerated LLE Weight Bearing: Weight bearing as tolerated Other Position/Activity Restrictions: WBAT Pain: Pain Assessment Pain Score: 3  ADL: ADL Eating: Independent Where Assessed-Eating: Chair Grooming: Independent Where Assessed-Grooming: Standing at sink Upper Body Bathing: Independent Where Assessed-Upper Body Bathing: Shower Lower Body Bathing: Modified independent Where Assessed-Lower Body Bathing: Shower Upper Body Dressing: Independent Where Assessed-Upper Body Dressing: Chair Lower Body Dressing: Modified  independent Where Assessed-Lower Body Dressing: Chair Toileting: Modified independent Where Assessed-Toileting: Glass blower/designer: Distant supervision Armed forces technical officer Method: Magazine features editor: Distant supervision Social research officer, government Method: Heritage manager: Transfer tub bench,Grab bars :     Therapy/Group: Individual Therapy  Bevely Hackbart A Eileen Kangas 02/10/2020, 12:17 PM

## 2020-02-10 NOTE — Progress Notes (Signed)
Physical Therapy Discharge Summary  Patient Details  Name: Melissa Velazquez MRN: [id number] Date of Birth: Jul 09, 1950  Today's Date: 02/10/2020 PT Individual Time: 0918-1000 PT Individual Time Calculation (min): 42 min    Patient has met 10 of 10 long term goals due to improved activity tolerance, improved balance, improved postural control, increased strength, increased range of motion, decreased pain, ability to compensate for deficits, functional use of  right lower extremity and left lower extremity, improved awareness and improved coordination.  Patient to discharge at an ambulatory level Modified Independent using RW and requiring supervision for stair navigation.  Patient's care partner is independent to provide the necessary physical assistance at discharge.  All goals met.  Recommendation:  Patient will benefit from ongoing skilled PT services in home health setting to continue to advance safe functional mobility, address ongoing impairments in B LE ROM and strength, dynamic standing balance, gait training with LRAD, stair navigation, and minimize fall risk.  Equipment: No equipment provided  - pt has all necessary DME (RW)  Reasons for discharge: treatment goals met and discharge from hospital  Patient/family agrees with progress made and goals achieved: Yes  Skilled Therapeutic Interventions/Progress Updates:  Pt received long sitting in bed and agreeable to therapy session. Pt verbalizes her preference is to D/C home tomorrow as opposed to on Monday. Per pt and chart review pt already has RW and SW has already arranged for [facility] follow-up therapies. Discussed with Dr. Dagoberto Ligas and based on chart review and pt's presentation during session feel pt is safe to D/C home at mod-I level tomorrow. Pt performed all mobility tasks at mod-I level using RW including: sit<>stands, stand pivot transfers, ambulating >246ft, ambulating ~23ft up/down ramp. Pt ascended/descended 12 steps using L HR via  side step technique to simulate home environment with distant supervision and no cuing needed for proper technique. Discussed having her sister assist with AD management up/down stairs. Ambulated ~19ft over mulch and up/down curb step using RW with supervision for safety. Simulated car transfer (sedan height) using RW mod-I. At end of session pt left sitting EOB as hand-off to OT. Pt cleared to be mod-I in room using RW.  PT Discharge Precautions/Restrictions Precautions Precautions: Fall;Other (comment) Precaution Comments: B TKA Restrictions Weight Bearing Restrictions: Yes RLE Weight Bearing: Weight bearing as tolerated LLE Weight Bearing: Weight bearing as tolerated Pain Reports knees are "sore" but no specific pain number provided. Premedicated. Perception  Perception Perception: Within Functional Limits Praxis Praxis: Intact  Cognition Overall Cognitive Status: Within Functional Limits for tasks assessed Arousal/Alertness: Awake/alert Orientation Level: Oriented X4 Attention: Focused;Selective;Sustained Focused Attention: Appears intact Sustained Attention: Appears intact Selective Attention: Appears intact Memory: Appears intact Awareness: Appears intact Problem Solving: Appears intact Safety/Judgment: Appears intact Sensation Sensation Light Touch: Appears Intact Hot/Cold: Appears Intact Proprioception: Appears Intact Coordination Gross Motor Movements are Fluid and Coordinated: Yes Coordination and Movement Description: Gross motor movements are coordinated with improvement in B LE ROM and strength though still progressing as expected post-op Motor  Motor Motor: Within Functional Limits Motor - Discharge Observations: WFL as expected post-op B TKA   Mobility Bed Mobility Bed Mobility: Supine to Sit;Sit to Supine Supine to Sit: Independent Sit to Supine: Independent Transfers Transfers: Sit to Stand;Stand to [employer] Transfers Sit to Stand: Independent  with assistive device Stand to Sit: Independent with assistive device Stand Pivot Transfers: Independent with assistive device Transfer (Assistive device): Rolling walker Locomotion  Gait Ambulation: Yes Gait Assistance: Independent with assistive device Gait Distance (Feet):  200 Feet Assistive device: Rolling walker Gait Gait: Yes Gait Pattern: Impaired Gait Pattern: Decreased hip/knee flexion - right;Decreased hip/knee flexion - left (still some decreased knee flexion during swing as expected at this stage post-op B TKA) Gait velocity: decreased Stairs / Additional Locomotion Stairs: Yes Stairs Assistance: Supervision/Verbal cueing Stair Management Technique: One rail Left Number of Stairs: 12 Height of Stairs: 6 Ramp: Independent with assistive device (using RW) Curb: Supervision/Verbal cueing (using RW) Wheelchair Mobility Wheelchair Mobility: No  Trunk/Postural Assessment  Cervical Assessment Cervical Assessment: Within Functional Limits Thoracic Assessment Thoracic Assessment: Within Functional Limits Lumbar Assessment Lumbar Assessment: Within Functional Limits Postural Control Postural Control: Within Functional Limits (using RW)  Balance Balance Balance Assessed: Yes Static Sitting Balance Static Sitting - Level of Assistance: 7: Independent Dynamic Sitting Balance Dynamic Sitting - Balance Support: During functional activity Dynamic Sitting - Level of Assistance: 6: Modified independent (Device/Increase time) Static Standing Balance Static Standing - Balance Support: During functional activity Static Standing - Level of Assistance: 6: Modified independent (Device/Increase time) Dynamic Standing Balance Dynamic Standing - Balance Support: During functional activity;Bilateral upper extremity supported Dynamic Standing - Level of Assistance: 6: Modified independent (Device/Increase time) (using RW) Extremity Assessment      RLE Assessment RLE Assessment:  Exceptions to [facility] Passive Range of Motion (PROM) Comments: R knee flexion PROM 105 degrees Active Range of Motion (AROM) Comments: R knee flexion AROM 95degrees General Strength Comments: demonstrates at least 4/5 functionally LLE Assessment Passive Range of Motion (PROM) Comments: L knee flexion PROM 109 degrees Active Range of Motion (AROM) Comments: L knee flexion AROM 100 degrees General Strength Comments: demonstrates at least 4/5 functionally    Tawana Scale , PT, DPT, CSRS  02/10/2020, 7:31 PM

## 2020-02-10 NOTE — Progress Notes (Signed)
PDMP [place] reviewed for the past year. Ultram 50 mg #52 tabs/1-2 po q 6 hrs prn severe pain called in to [employer] dr/[place].
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
## 2020-02-10 NOTE — Progress Notes (Signed)
Lovenox education kit provided to patient. Materials and instructions discussed. No questions at this time.

## 2020-02-10 NOTE — Plan of Care (Signed)
  Problem: Consults Goal: RH GENERAL PATIENT EDUCATION Description: See Patient Education module for education specifics. Outcome: Progressing   Problem: RH SKIN INTEGRITY Goal: RH STG ABLE TO PERFORM INCISION/WOUND CARE W/ASSISTANCE Description: STG Able To Perform Incision/Wound Care With min Assistance. Outcome: Progressing   Problem: RH PAIN MANAGEMENT Goal: RH STG PAIN MANAGED AT OR BELOW PT'S PAIN GOAL Description: Pain at or below level 4  Outcome: Progressing   Problem: RH KNOWLEDGE DEFICIT GENERAL Goal: RH STG INCREASE KNOWLEDGE OF SELF CARE AFTER HOSPITALIZATION Description: Patient will be able to manage care at discharge using handouts and education independently  Outcome: Progressing   

## 2020-02-11 ENCOUNTER — Inpatient Hospital Stay (HOSPITAL_COMMUNITY): Payer: Medicare Other | Admitting: Occupational Therapy

## 2020-02-11 ENCOUNTER — Inpatient Hospital Stay (HOSPITAL_COMMUNITY): Payer: Medicare Other | Admitting: Physical Therapy

## 2020-02-11 MED ORDER — NYSTATIN 100000 UNIT/ML MT SUSP: 5.0000 mL | Freq: Four times a day (QID) | OROMUCOSAL | 0 refills | Status: DC

## 2020-02-11 NOTE — Plan of Care (Signed)
  Problem: RH Balance Goal: LTG: Patient will maintain dynamic sitting balance (OT) Description: LTG:  Patient will maintain dynamic sitting balance with assistance during activities of daily living (OT) Outcome: Completed/Met Goal: LTG Patient will maintain dynamic standing with ADLs (OT) Description: LTG:  Patient will maintain dynamic standing balance with assist during activities of daily living (OT)  Outcome: Completed/Met   Problem: RH Grooming Goal: LTG Patient will perform grooming w/assist,cues/equip (OT) Description: LTG: Patient will perform grooming with assist, with/without cues using equipment (OT) Outcome: Completed/Met   Problem: RH Bathing Goal: LTG Patient will bathe all body parts with assist levels (OT) Description: LTG: Patient will bathe all body parts with assist levels (OT) Outcome: Completed/Met   Problem: RH Dressing Goal: LTG Patient will perform upper body dressing (OT) Description: LTG Patient will perform upper body dressing with assist, with/without cues (OT). Outcome: Completed/Met Goal: LTG Patient will perform lower body dressing w/assist (OT) Description: LTG: Patient will perform lower body dressing with assist, with/without cues in positioning using equipment (OT) Outcome: Completed/Met   Problem: RH Toileting Goal: LTG Patient will perform toileting task (3/3 steps) with assistance level (OT) Description: LTG: Patient will perform toileting task (3/3 steps) with assistance level (OT)  Outcome: Completed/Met   Problem: RH Simple Meal Prep Goal: LTG Patient will perform simple meal prep w/assist (OT) Description: LTG: Patient will perform simple meal prep with assistance, with/without cues (OT). Outcome: Completed/Met   Problem: RH Laundry Goal: LTG Patient will perform laundry w/assist, cues (OT) Description: LTG: Patient will perform laundry with assistance, with/without cues (OT). Outcome: Completed/Met   Problem: RH Light  Housekeeping Goal: LTG Patient will perform light housekeeping w/assist (OT) Description: LTG: Patient will perform light housekeeping with assistance, with/without cues (OT). Outcome: Completed/Met   Problem: RH Toilet Transfers Goal: LTG Patient will perform toilet transfers w/assist (OT) Description: LTG: Patient will perform toilet transfers with assist, with/without cues using equipment (OT) Outcome: Completed/Met   Problem: RH Tub/Shower Transfers Goal: LTG Patient will perform tub/shower transfers w/assist (OT) Description: LTG: Patient will perform tub/shower transfers with assist, with/without cues using equipment (OT) Outcome: Completed/Met

## 2020-02-11 NOTE — Plan of Care (Signed)
  Problem: Consults Goal: RH GENERAL PATIENT EDUCATION Description: See Patient Education module for education specifics. Outcome: Completed/Met   Problem: RH SKIN INTEGRITY Goal: RH STG ABLE TO PERFORM INCISION/WOUND CARE W/ASSISTANCE Description: STG Able To Perform Incision/Wound Care With min Assistance. Outcome: Completed/Met   Problem: RH PAIN MANAGEMENT Goal: RH STG PAIN MANAGED AT OR BELOW PT'S PAIN GOAL Description: Pain at or below level 4  Outcome: Completed/Met   Problem: RH KNOWLEDGE DEFICIT GENERAL Goal: RH STG INCREASE KNOWLEDGE OF SELF CARE AFTER HOSPITALIZATION Description: Patient will be able to manage care at discharge using handouts and education independently  Outcome: Completed/Met

## 2020-02-11 NOTE — Progress Notes (Signed)
Patient able to self administer lovenox injection without difficulty. Reviewed discharge instructions with patient. All questions answered. All belongings packed at bedside. Awaiting patient's transport.

## 2020-02-11 NOTE — Progress Notes (Signed)
Patient discharged at 74 with all belongings.

## 2020-02-12 ENCOUNTER — Telehealth: Payer: Self-pay

## 2020-02-12 DIAGNOSIS — Z96653 Presence of artificial knee joint, bilateral: Secondary | ICD-10-CM | POA: Diagnosis not present

## 2020-02-12 DIAGNOSIS — Z79891 Long term (current) use of opiate analgesic: Secondary | ICD-10-CM | POA: Diagnosis not present

## 2020-02-12 DIAGNOSIS — Z6833 Body mass index (BMI) 33.0-33.9, adult: Secondary | ICD-10-CM | POA: Diagnosis not present

## 2020-02-12 DIAGNOSIS — I129 Hypertensive chronic kidney disease with stage 1 through stage 4 chronic kidney disease, or unspecified chronic kidney disease: Secondary | ICD-10-CM | POA: Diagnosis not present

## 2020-02-12 DIAGNOSIS — Z471 Aftercare following joint replacement surgery: Secondary | ICD-10-CM | POA: Diagnosis not present

## 2020-02-12 DIAGNOSIS — E039 Hypothyroidism, unspecified: Secondary | ICD-10-CM | POA: Diagnosis not present

## 2020-02-12 DIAGNOSIS — J4520 Mild intermittent asthma, uncomplicated: Secondary | ICD-10-CM | POA: Diagnosis not present

## 2020-02-12 DIAGNOSIS — I480 Paroxysmal atrial fibrillation: Secondary | ICD-10-CM | POA: Diagnosis not present

## 2020-02-12 DIAGNOSIS — K580 Irritable bowel syndrome with diarrhea: Secondary | ICD-10-CM | POA: Diagnosis not present

## 2020-02-12 DIAGNOSIS — E669 Obesity, unspecified: Secondary | ICD-10-CM | POA: Diagnosis not present

## 2020-02-12 DIAGNOSIS — K219 Gastro-esophageal reflux disease without esophagitis: Secondary | ICD-10-CM | POA: Diagnosis not present

## 2020-02-12 DIAGNOSIS — N189 Chronic kidney disease, unspecified: Secondary | ICD-10-CM | POA: Diagnosis not present

## 2020-02-12 DIAGNOSIS — Z7982 Long term (current) use of aspirin: Secondary | ICD-10-CM | POA: Diagnosis not present

## 2020-02-12 DIAGNOSIS — D62 Acute posthemorrhagic anemia: Secondary | ICD-10-CM | POA: Diagnosis not present

## 2020-02-12 DIAGNOSIS — D696 Thrombocytopenia, unspecified: Secondary | ICD-10-CM | POA: Diagnosis not present

## 2020-02-12 DIAGNOSIS — F419 Anxiety disorder, unspecified: Secondary | ICD-10-CM | POA: Diagnosis not present

## 2020-02-12 NOTE — Progress Notes (Signed)
Contacted patient and advised her to follow up with Dr. Harl Bowie for input on anticoagulation (due to episode of PAF post surgery).

## 2020-02-12 NOTE — Telephone Encounter (Signed)
Verbal orders okayed for Physical therapy. Three times per week for one week, then twice a week for one week.. Per protocol discharge summary was reviewed.   ([facility]- Phone contact [phone].

## 2020-02-12 NOTE — Progress Notes (Addendum)
Inpatient Rehabilitation Care Coordinator Discharge Note  The overall goal for the admission was met for:   Discharge location: Yes. D/c to home with.   Length of Stay: Yes. 11 days.  Discharge activity level: Yes. Mod I with RW and supervision with stairs.   Home/community participation: Yes  Services provided included: MD, RD, PT, OT, RN, CM, TR, Pharmacy, Neuropsych and SW  Financial Services: Medicare and Private Insurance: [employer] offered to/list presented [email or web address] HHA list  Follow-up services arranged: Home Health: [facility] for HHPT/OT/SLP and DME: RW (has already)  Comments (or additional information): contact pt # [phone]   Patient/Family verbalized understanding of follow-up arrangements: Yes  Individual responsible for coordination of the follow-up plan: Pt to have assistance with coordinating d/c needs.   Confirmed correct DME delivered: Rana Snare 02/12/2020    Rana Snare

## 2020-02-14 DIAGNOSIS — Z471 Aftercare following joint replacement surgery: Secondary | ICD-10-CM | POA: Diagnosis not present

## 2020-02-14 DIAGNOSIS — D62 Acute posthemorrhagic anemia: Secondary | ICD-10-CM | POA: Diagnosis not present

## 2020-02-14 DIAGNOSIS — J4520 Mild intermittent asthma, uncomplicated: Secondary | ICD-10-CM | POA: Diagnosis not present

## 2020-02-14 DIAGNOSIS — I129 Hypertensive chronic kidney disease with stage 1 through stage 4 chronic kidney disease, or unspecified chronic kidney disease: Secondary | ICD-10-CM | POA: Diagnosis not present

## 2020-02-14 DIAGNOSIS — I480 Paroxysmal atrial fibrillation: Secondary | ICD-10-CM | POA: Diagnosis not present

## 2020-02-14 DIAGNOSIS — Z96653 Presence of artificial knee joint, bilateral: Secondary | ICD-10-CM | POA: Diagnosis not present

## 2020-02-15 ENCOUNTER — Telehealth: Payer: Self-pay | Admitting: Cardiology

## 2020-02-15 DIAGNOSIS — I4891 Unspecified atrial fibrillation: Secondary | ICD-10-CM | POA: Diagnosis not present

## 2020-02-15 DIAGNOSIS — Z681 Body mass index (BMI) 19 or less, adult: Secondary | ICD-10-CM | POA: Diagnosis not present

## 2020-02-15 DIAGNOSIS — M1991 Primary osteoarthritis, unspecified site: Secondary | ICD-10-CM | POA: Diagnosis not present

## 2020-02-15 MED ORDER — APIXABAN 5 MG PO TABS: 5.0000 mg | ORAL_TABLET | Freq: Two times a day (BID) | ORAL | 6 refills | Status: DC

## 2020-02-15 NOTE — Telephone Encounter (Signed)
New message     Patient just had knee surgery and after having the surgery she went into AFIB the sent her home with Lovenox and then told her to take aspirin regimen afterwards, she spoke with per PCP and they said to contact Dr Harl Bowie to find out how to proceed

## 2020-02-15 NOTE — Telephone Encounter (Signed)
Per Dr.Branch: For this patient can we give her samples of eliquis to cover her until she is seen 02/20/20, stop lovenox once starts eliquis    Patient took her daily dose of Lovenox already today. Will start Eliquis 5 mg twice a day tomorrow, 02/16/19 and Stop Aspirin.   Samples Eliquis 5 mg given, # 14 lot [id number], exp 02/2022,  plus 30 day free trial card.    Patient stated she does not want to take Eliquis due to cost but will discuss at visit on 02/20/20 10:15 am [facility] office with Richardson Dopp, PA-C. Dr.Branch in agreement.

## 2020-02-19 DIAGNOSIS — Z471 Aftercare following joint replacement surgery: Secondary | ICD-10-CM | POA: Diagnosis not present

## 2020-02-19 DIAGNOSIS — D62 Acute posthemorrhagic anemia: Secondary | ICD-10-CM | POA: Diagnosis not present

## 2020-02-19 DIAGNOSIS — J4520 Mild intermittent asthma, uncomplicated: Secondary | ICD-10-CM | POA: Diagnosis not present

## 2020-02-19 DIAGNOSIS — Z96653 Presence of artificial knee joint, bilateral: Secondary | ICD-10-CM | POA: Diagnosis not present

## 2020-02-19 DIAGNOSIS — I480 Paroxysmal atrial fibrillation: Secondary | ICD-10-CM | POA: Diagnosis not present

## 2020-02-19 DIAGNOSIS — I129 Hypertensive chronic kidney disease with stage 1 through stage 4 chronic kidney disease, or unspecified chronic kidney disease: Secondary | ICD-10-CM | POA: Diagnosis not present

## 2020-02-19 NOTE — Progress Notes (Unsigned)
Cardiology Office Note:    Date:  02/20/2020   ID:  Melissa Velazquez, DOB April 12, 1950, MRN [id number]  PCP:  [name], MD  [facility] Cardiologist:  Carlyle Dolly, MD  [facility] Electrophysiologist:  None   Referring MD: [name], MD   Chief Complaint:  Hospitalization Follow-up (Atrial fibrillation)    Patient Profile:    Melissa Velazquez is a 70 y.o. female with:   Diabetes mellitus   Paroxysmal atrial fibrillation   AF w RVR post b/l TKR in 01/2020 CHA2DS2-VASc Score = 4 [CHF History: No, HTN History: Yes, Diabetes History: Yes, Stroke History: No, Vascular Disease History: No, Age Score: 1, Gender Score: 1].    Hypertension   Hypothyroidism   Asthma   Depression   Obesity   Prior CV studies: Echocardiogram 01/29/20 EF 60-65, no RWMA, mild conc LVH, mod basal septal hypertrophy, mild reduced RVSF, RVSP 48.2 mmHg, Lg pleural effusion left lateral region (CXR w/o pleural eff), mild AV sclerosis without AS, mean AV 4 mmHg/VMax 151 cm/s, mild to mod TR, RA 15 mmHg  History of Present Illness:    Ms. Colclasure was last seen by Dr. Harl Bowie in 6/21.  She underwent bilateral total knee replacements in 01/2020.  Her post op course was c/b AFib w RVR.  She converted to normal sinus rhythm with IV Metoprolol.  An echocardiogram demonstrated normal EF.  She was DCd on Rivaroxaban for anticoagulation.  She went to inpt rehab post DC and was changed to DVT prophylaxis dose Enoxaparin for a week, then ASA 81 mg once daily for 3 weeks.  She contacted our office post DC and was started on Apixaban with samples.  she is seen for f/u.  She is here alone.  She is working with physical therapy at home now to recover from her knee replacements.  She has not had any palpitations, chest pain, shortness of breath, syncope, orthopnea or leg edema.      Past Medical History:  Diagnosis Date  . Allergic rhinitis   . Anal fissure    Hx of   . Anxiety    hx of  . Arthritis   .  Asthma    none in last year   . Chronic kidney disease    partial nephrectomy  . Depression    hx of  . Diabetes mellitus without complication (HCC)    no meds  . Diverticular disease   . Dysrhythmia    palpitations  . Eczema   . Endometrial cancer ([place]) 2001   spread to appendix   . Hemorrhoids   . Hyperglycemia   . Hypertension   . Hypothyroidism   . Kidney cancer, primary, with metastasis from kidney to other site [facility])   . Obesity   . Sebaceous cyst   . Thyroid disease   . Thyroid nodule   . 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    Current Medications: Current Meds  Medication Sig  . albuterol (PROVENTIL HFA;VENTOLIN HFA) 108 (90 Base) MCG/ACT inhaler Inhale 2 puffs into the lungs every 6 (six) hours as needed for wheezing or shortness of breath.  Marland Kitchen apixaban (ELIQUIS) 5 MG TABS tablet Take 1 tablet (5 mg total) by mouth 2 (two) times daily.  Marland Kitchen apixaban (ELIQUIS) 5 MG TABS tablet Take 1 tablet (5 mg total) by mouth 2 (two) times daily.  . cetirizine (ZYRTEC) 10 MG tablet Take 10 mg by mouth daily as needed for allergies.  Marland Kitchen diclofenac Sodium (VOLTAREN) 1 %  GEL Apply 4 g topically 4 (four) times daily.  . fluconazole (DIFLUCAN) 150 MG tablet SMARTSIG:1 Tablet(s) By Mouth Every 4 Days  . fluticasone (FLONASE) 50 MCG/ACT nasal spray Place 1 spray into both nostrils 2 (two) times daily as needed for allergies or rhinitis.  Marland Kitchen gabapentin (NEURONTIN) 300 MG capsule Take a 300 mg capsule three times a day till 01/26. Then decrease to 300 mg capsule two times a day for two weeks. Then take a 300 mg capsule once a day for two weeks. Then discontinue.  . levothyroxine (SYNTHROID, LEVOTHROID) 75 MCG tablet Take 75 mcg by mouth daily before breakfast.  . metoprolol tartrate (LOPRESSOR) 25 MG tablet Take 0.5 tablets (12.5 mg total) by mouth as needed (for palpitations).  . nystatin (MYCOSTATIN) 100000 UNIT/ML suspension Take 5 mLs (500,000 Units total) by mouth 4 (four) times daily.  Swish and swallow  . pantoprazole (PROTONIX) 20 MG tablet Take 1 tablet (20 mg total) by mouth daily.  . sertraline (ZOLOFT) 100 MG tablet Take 150 mg by mouth daily with breakfast.   . traMADol (ULTRAM) 50 MG tablet Take 1-2 tablets (50-100 mg total) by mouth every 6 (six) hours as needed for severe pain.  Marland Kitchen triamcinolone cream (KENALOG) 0.1 % Apply 1 application topically daily as needed (rash).  . [DISCONTINUED] apixaban (ELIQUIS) 5 MG TABS tablet Take 1 tablet (5 mg total) by mouth 2 (two) times daily.     Allergies:   Triple antibiotic [bacitracin-neomycin-polymyxin], Levofloxacin, Oxycodone, Capsaicin, Elastic bandages & [zinc], and Nickel   Social History   Tobacco Use  . Smoking status: Former Smoker    Packs/day: 1.00    Years: 20.00    Pack years: 20.00    Types: Cigarettes    Quit date: 01/19/1998    Years since quitting: 22.1  . Smokeless tobacco: Never Used  Vaping Use  . Vaping Use: Never used  Substance Use Topics  . Alcohol use: Yes    Alcohol/week: 0.0 standard drinks    Comment: rare  . Drug use: No     Family Hx: The patient's family history includes Asthma in her sister; Breast cancer in her maternal aunt and mother; COPD in her father; Diabetes in her mother; Eczema in her sister; Obesity in her sister; Prostate cancer in her father; Urticaria in her maternal grandfather. There is no history of Colon cancer.  Review of Systems  Gastrointestinal: Negative for hematochezia.  Genitourinary: Negative for hematuria.     EKGs/Labs/Other Test Reviewed:    EKG:  EKG is  ordered today.  The ekg ordered today demonstrates probable ectopic atrial rhythm, normal axis, no ST-T wave changes, QTC 420  Recent Labs: 01/29/2020: TSH 2.872 02/05/2020: ALT 39; BUN 11; Creatinine, Ser 0.90; Hemoglobin 10.6; Platelets 292; Potassium 4.8; Sodium 141   Recent Lipid Panel Lab Results  Component Value Date/Time   CHOL 101 03/19/2008 08:53 PM   TRIG 33 03/19/2008 08:53 PM    HDL 40 03/19/2008 08:53 PM   CHOLHDL 2.5 Ratio 03/19/2008 08:53 PM   LDLCALC 54 03/19/2008 08:53 PM     Risk Assessment/Calculations:    CHA2DS2-VASc Score = 4  This indicates a 4.8% annual risk of stroke. The patient's score is based upon: CHF History: No HTN History: Yes Diabetes History: Yes Stroke History: No Vascular Disease History: No Age Score: 1 Gender Score: 1     Physical Exam:    VS:  BP 128/78   Pulse 81   Ht 5\' 4"  (1.626 m)  Wt 187 lb 9.6 oz (85.1 kg)   SpO2 99%   BMI 32.20 kg/m     Wt Readings from Last 3 Encounters:  02/20/20 187 lb 9.6 oz (85.1 kg)  01/30/20 206 lb 8 oz (93.7 kg)  01/24/20 194 lb 7.1 oz (88.2 kg)     Constitutional:      Appearance: Healthy appearance. Not in distress.  Neck:     Vascular: No JVR.  Pulmonary:     Effort: Pulmonary effort is normal.     Breath sounds: No wheezing. No rales.  Cardiovascular:     Normal rate. Regular rhythm. Normal S1. Normal S2.     Murmurs: There is no murmur.  Edema:    Peripheral edema absent.  Abdominal:     Palpations: Abdomen is soft.  Skin:    General: Skin is warm and dry.  Neurological:     Mental Status: Alert and oriented to person, place and time.     Cranial Nerves: Cranial nerves are intact.      ASSESSMENT & PLAN:    1. Paroxysmal atrial fibrillation (HCC) She is somewhat discouraged that she developed atrial fibrillation after surgery.  I did explain to her that her palpitations in the past were likely atrial fibrillation that had not yet been detected.  Given her risk for stroke, she will need long-term anticoagulation.  Continue Apixaban.  She is not sure if she will be able to afford this.  I have asked her to go ahead and try to get it filled and if it is too expensive, she should call us.  At that point, we will need to transition her to Coumadin.  She has not had palpitations.  Her electrocardiogram today demonstrates an ectopic atrial rhythm but no atrial  fibrillation.  For now, I will give her metoprolol tartrate to take 12.5 mg as needed for palpitations.  Follow-up with Dr. Harl Bowie in 6 to 8 weeks.  As long as she remains on Apixaban, she will need a CBC, BMET at that time.  2. Essential hypertension The patient's blood pressure is controlled on her current regimen.  Continue current therapy.   3. Hypothyroidism, unspecified type Recent TSH normal       Dispo:  Return in about 6 weeks (around 04/02/2020) for Routine Follow Up with Dr. Harl Bowie.   Medication Adjustments/Labs and Tests Ordered: Current medicines are reviewed at length with the patient today.  Concerns regarding medicines are outlined above.  Tests Ordered: Orders Placed This Encounter  Procedures  . Basic metabolic panel  . CBC  . EKG 12-Lead   Medication Changes: Meds ordered this encounter  Medications  . metoprolol tartrate (LOPRESSOR) 25 MG tablet    Sig: Take 0.5 tablets (12.5 mg total) by mouth as needed (for palpitations).    Dispense:  45 tablet    Refill:  3  . apixaban (ELIQUIS) 5 MG TABS tablet    Sig: Take 1 tablet (5 mg total) by mouth 2 (two) times daily.    Dispense:  60 tablet    Refill:  11  . apixaban (ELIQUIS) 5 MG TABS tablet    Sig: Take 1 tablet (5 mg total) by mouth 2 (two) times daily.    Dispense:  28 tablet    Refill:  0    Lot [id number] Exp 04/2021    Signed, Richardson Dopp, PA-C  02/20/2020 5:28 PM    [facility] [address], [place], [place]  [address] Phone: [phone]; Fax: [phone]

## 2020-02-20 ENCOUNTER — Encounter: Payer: Self-pay | Admitting: Physician Assistant

## 2020-02-20 ENCOUNTER — Ambulatory Visit (INDEPENDENT_AMBULATORY_CARE_PROVIDER_SITE_OTHER): Payer: Medicare Other | Admitting: Physician Assistant

## 2020-02-20 VITALS — BP 128/78 | HR 81 | Ht 64.0 in | Wt 187.6 lb

## 2020-02-20 DIAGNOSIS — E039 Hypothyroidism, unspecified: Secondary | ICD-10-CM

## 2020-02-20 DIAGNOSIS — I10 Essential (primary) hypertension: Secondary | ICD-10-CM

## 2020-02-20 DIAGNOSIS — I480 Paroxysmal atrial fibrillation: Secondary | ICD-10-CM

## 2020-02-20 DIAGNOSIS — E119 Type 2 diabetes mellitus without complications: Secondary | ICD-10-CM

## 2020-02-20 MED ORDER — METOPROLOL TARTRATE 25 MG PO TABS: 12.5000 mg | ORAL_TABLET | ORAL | 3 refills | Status: AC | PRN

## 2020-02-20 MED ORDER — APIXABAN 5 MG PO TABS: 5.0000 mg | ORAL_TABLET | Freq: Two times a day (BID) | ORAL | 0 refills | Status: DC

## 2020-02-20 MED ORDER — APIXABAN 5 MG PO TABS: 5.0000 mg | ORAL_TABLET | Freq: Two times a day (BID) | ORAL | 11 refills | Status: DC

## 2020-02-20 NOTE — Patient Instructions (Addendum)
Medication Instructions:  1.Start metoprolol tartrate (Lopressor), take one half tablet (12.5 mg) by mouth as needed for palpitations.  *If you need a refill on your cardiac medications before your next appointment, please call your pharmacy*   Lab Work: BMET and CBC prior to your next visit If you have labs (blood work) drawn today and your tests are completely normal, you will receive your results only by: Marland Kitchen MyChart Message (if you have MyChart) OR . A paper copy in the mail If you have any lab test that is abnormal or we need to change your treatment, we will call you to review the results.   Testing/Procedures: None   Follow-Up: At [facility], you and your health needs are our priority.  As part of our continuing mission to provide you with exceptional heart care, we have created designated Provider Care Teams.  These Care Teams include your primary Cardiologist (physician) and Advanced Practice Providers (APPs -  Physician Assistants and Nurse Practitioners) who all work together to provide you with the care you need, when you need it.   Your next appointment:   6-8 week(s) with a CBC and BMET  The format for your next appointment:   In Person  Provider:   Carlyle Dolly, MD   Other Instructions Call our office to discuss switching from eliquis to warfarin if the eliquis is unaffordable.

## 2020-02-22 ENCOUNTER — Telehealth: Payer: Self-pay | Admitting: Pharmacist

## 2020-02-22 DIAGNOSIS — Z471 Aftercare following joint replacement surgery: Secondary | ICD-10-CM | POA: Diagnosis not present

## 2020-02-22 DIAGNOSIS — Z96653 Presence of artificial knee joint, bilateral: Secondary | ICD-10-CM | POA: Diagnosis not present

## 2020-02-22 DIAGNOSIS — D62 Acute posthemorrhagic anemia: Secondary | ICD-10-CM | POA: Diagnosis not present

## 2020-02-22 DIAGNOSIS — I129 Hypertensive chronic kidney disease with stage 1 through stage 4 chronic kidney disease, or unspecified chronic kidney disease: Secondary | ICD-10-CM | POA: Diagnosis not present

## 2020-02-22 DIAGNOSIS — I480 Paroxysmal atrial fibrillation: Secondary | ICD-10-CM | POA: Diagnosis not present

## 2020-02-22 DIAGNOSIS — J4520 Mild intermittent asthma, uncomplicated: Secondary | ICD-10-CM | POA: Diagnosis not present

## 2020-02-22 MED ORDER — RIVAROXABAN 20 MG PO TABS: 20.0000 mg | ORAL_TABLET | Freq: Every day | ORAL | 5 refills | Status: DC

## 2020-02-22 NOTE — Telephone Encounter (Signed)
Key [id number] PA for Eliquis submitted Stated during the PA that Xarelto was a formulary alternative/preferred. Will be cheaper to switch to Xarelto, because even if approved they will place on a higher tier. Scott- do you want me to switch her to Xarelto to see cost. Im pretty sure it will be $40/month

## 2020-02-22 NOTE — Telephone Encounter (Signed)
She was on that originally and then had meds changed in Rehab.  Yes, please change from Apixaban to Rivaroxaban. Estimated Creatinine Clearance: 63.2 mL/min (by C-G formula based on SCr of 0.9 mg/dL).  Rivaroxaban 20 mg once daily. Thanks [employer], [place]    02/22/2020 3:09 PM

## 2020-02-22 NOTE — Telephone Encounter (Signed)
Rx for Xarelto 20mg  sent to pharmacy. I have called pt and left message to review the change. I anticipate the cost being 40/month. Will need to discuss if pt can afford this.

## 2020-02-23 NOTE — Telephone Encounter (Signed)
Patient returned call. I advised that Xarelto is preferred. She stated that Eliquis was the preferred and that's why she was switched in rehab. Her PA for Eliquis was approved, but it says non formulary request. I advised I will call and figure out which one is cheaper.  I called pt insurance. Patient has a $310 deductible. After that her Xarelto copay would be ~42/month or $120/90 days. She is not restricted to a certain pharmacy. eliquis would be 223.93 per month bc it is a tier 4 and she would have to pay 25% of cost.  First fill Xarelto $344.75 (~$42/month) $120/90- First fill cost is high because of deductible   Eliquis $223.93 tier 4 (25% of cost)  Left VM for pt to call back to review

## 2020-02-29 NOTE — Telephone Encounter (Signed)
Left a detailed message per DPR on patient machine with the information below

## 2020-03-05 DIAGNOSIS — Z96653 Presence of artificial knee joint, bilateral: Secondary | ICD-10-CM | POA: Diagnosis not present

## 2020-03-05 DIAGNOSIS — Z96651 Presence of right artificial knee joint: Secondary | ICD-10-CM | POA: Diagnosis not present

## 2020-03-05 DIAGNOSIS — Z96652 Presence of left artificial knee joint: Secondary | ICD-10-CM | POA: Diagnosis not present

## 2020-03-05 DIAGNOSIS — Z471 Aftercare following joint replacement surgery: Secondary | ICD-10-CM | POA: Diagnosis not present

## 2020-03-12 DIAGNOSIS — Z4789 Encounter for other orthopedic aftercare: Secondary | ICD-10-CM | POA: Diagnosis not present

## 2020-03-12 DIAGNOSIS — R2689 Other abnormalities of gait and mobility: Secondary | ICD-10-CM | POA: Diagnosis not present

## 2020-03-12 DIAGNOSIS — M25662 Stiffness of left knee, not elsewhere classified: Secondary | ICD-10-CM | POA: Diagnosis not present

## 2020-03-12 DIAGNOSIS — R531 Weakness: Secondary | ICD-10-CM | POA: Diagnosis not present

## 2020-03-12 DIAGNOSIS — M25561 Pain in right knee: Secondary | ICD-10-CM | POA: Diagnosis not present

## 2020-03-12 DIAGNOSIS — M25661 Stiffness of right knee, not elsewhere classified: Secondary | ICD-10-CM | POA: Diagnosis not present

## 2020-03-12 DIAGNOSIS — M25562 Pain in left knee: Secondary | ICD-10-CM | POA: Diagnosis not present

## 2020-03-14 DIAGNOSIS — R2689 Other abnormalities of gait and mobility: Secondary | ICD-10-CM | POA: Diagnosis not present

## 2020-03-14 DIAGNOSIS — M25662 Stiffness of left knee, not elsewhere classified: Secondary | ICD-10-CM | POA: Diagnosis not present

## 2020-03-14 DIAGNOSIS — R531 Weakness: Secondary | ICD-10-CM | POA: Diagnosis not present

## 2020-03-14 DIAGNOSIS — M25661 Stiffness of right knee, not elsewhere classified: Secondary | ICD-10-CM | POA: Diagnosis not present

## 2020-03-14 DIAGNOSIS — M25561 Pain in right knee: Secondary | ICD-10-CM | POA: Diagnosis not present

## 2020-03-14 DIAGNOSIS — Z4789 Encounter for other orthopedic aftercare: Secondary | ICD-10-CM | POA: Diagnosis not present

## 2020-03-14 DIAGNOSIS — M25562 Pain in left knee: Secondary | ICD-10-CM | POA: Diagnosis not present

## 2020-03-15 DIAGNOSIS — Z4789 Encounter for other orthopedic aftercare: Secondary | ICD-10-CM | POA: Diagnosis not present

## 2020-03-15 DIAGNOSIS — R2689 Other abnormalities of gait and mobility: Secondary | ICD-10-CM | POA: Diagnosis not present

## 2020-03-15 DIAGNOSIS — M25561 Pain in right knee: Secondary | ICD-10-CM | POA: Diagnosis not present

## 2020-03-15 DIAGNOSIS — M25661 Stiffness of right knee, not elsewhere classified: Secondary | ICD-10-CM | POA: Diagnosis not present

## 2020-03-15 DIAGNOSIS — M25662 Stiffness of left knee, not elsewhere classified: Secondary | ICD-10-CM | POA: Diagnosis not present

## 2020-03-15 DIAGNOSIS — R531 Weakness: Secondary | ICD-10-CM | POA: Diagnosis not present

## 2020-03-15 DIAGNOSIS — M25562 Pain in left knee: Secondary | ICD-10-CM | POA: Diagnosis not present

## 2020-03-18 DIAGNOSIS — I10 Essential (primary) hypertension: Secondary | ICD-10-CM | POA: Diagnosis not present

## 2020-03-18 DIAGNOSIS — E039 Hypothyroidism, unspecified: Secondary | ICD-10-CM | POA: Diagnosis not present

## 2020-03-18 DIAGNOSIS — M1991 Primary osteoarthritis, unspecified site: Secondary | ICD-10-CM | POA: Diagnosis not present

## 2020-03-19 DIAGNOSIS — M25561 Pain in right knee: Secondary | ICD-10-CM | POA: Diagnosis not present

## 2020-03-19 DIAGNOSIS — R2689 Other abnormalities of gait and mobility: Secondary | ICD-10-CM | POA: Diagnosis not present

## 2020-03-19 DIAGNOSIS — R531 Weakness: Secondary | ICD-10-CM | POA: Diagnosis not present

## 2020-03-19 DIAGNOSIS — Z4789 Encounter for other orthopedic aftercare: Secondary | ICD-10-CM | POA: Diagnosis not present

## 2020-03-19 DIAGNOSIS — M25562 Pain in left knee: Secondary | ICD-10-CM | POA: Diagnosis not present

## 2020-03-19 DIAGNOSIS — M25662 Stiffness of left knee, not elsewhere classified: Secondary | ICD-10-CM | POA: Diagnosis not present

## 2020-03-19 DIAGNOSIS — M25661 Stiffness of right knee, not elsewhere classified: Secondary | ICD-10-CM | POA: Diagnosis not present

## 2020-03-20 DIAGNOSIS — M25661 Stiffness of right knee, not elsewhere classified: Secondary | ICD-10-CM | POA: Diagnosis not present

## 2020-03-20 DIAGNOSIS — Z4789 Encounter for other orthopedic aftercare: Secondary | ICD-10-CM | POA: Diagnosis not present

## 2020-03-20 DIAGNOSIS — R2689 Other abnormalities of gait and mobility: Secondary | ICD-10-CM | POA: Diagnosis not present

## 2020-03-20 DIAGNOSIS — M25662 Stiffness of left knee, not elsewhere classified: Secondary | ICD-10-CM | POA: Diagnosis not present

## 2020-03-20 DIAGNOSIS — M25562 Pain in left knee: Secondary | ICD-10-CM | POA: Diagnosis not present

## 2020-03-20 DIAGNOSIS — M25561 Pain in right knee: Secondary | ICD-10-CM | POA: Diagnosis not present

## 2020-03-20 DIAGNOSIS — R531 Weakness: Secondary | ICD-10-CM | POA: Diagnosis not present

## 2020-03-22 DIAGNOSIS — M25661 Stiffness of right knee, not elsewhere classified: Secondary | ICD-10-CM | POA: Diagnosis not present

## 2020-03-22 DIAGNOSIS — R531 Weakness: Secondary | ICD-10-CM | POA: Diagnosis not present

## 2020-03-22 DIAGNOSIS — M25561 Pain in right knee: Secondary | ICD-10-CM | POA: Diagnosis not present

## 2020-03-22 DIAGNOSIS — M25662 Stiffness of left knee, not elsewhere classified: Secondary | ICD-10-CM | POA: Diagnosis not present

## 2020-03-22 DIAGNOSIS — M25562 Pain in left knee: Secondary | ICD-10-CM | POA: Diagnosis not present

## 2020-03-22 DIAGNOSIS — R2689 Other abnormalities of gait and mobility: Secondary | ICD-10-CM | POA: Diagnosis not present

## 2020-03-22 DIAGNOSIS — Z4789 Encounter for other orthopedic aftercare: Secondary | ICD-10-CM | POA: Diagnosis not present

## 2020-03-25 ENCOUNTER — Telehealth: Payer: Self-pay | Admitting: Cardiology

## 2020-03-25 NOTE — Telephone Encounter (Signed)
Ok to change to coumadin, please be sure she has enough eliquis until her appt with Nat Math BrancH MD

## 2020-03-25 NOTE — Telephone Encounter (Signed)
Are you okay with her changing to Warfarin ?

## 2020-03-25 NOTE — Telephone Encounter (Signed)
Left message to return call 

## 2020-03-25 NOTE — Telephone Encounter (Signed)
Pt c/o medication issue:  1. Name of Medication: Eliquis or Xarelto 20mg    2. How are you currently taking this medication (dosage and times per day)? Take 1 tablet (20 mg total) by mouth daily with supper.  3. Are you having a reaction (difficulty breathing--STAT)? no  4. What is your medication issue? Patient would like to switch her Medication to Warfrin due to the cost being too high for her

## 2020-03-25 NOTE — Telephone Encounter (Signed)
Patient notified and verbalized understanding.   New coumadin appointment scheduled with Edrick Oh, RN (anticoagulation nurse) for Monday. 04/01/2020 at 10:00 am.

## 2020-03-26 DIAGNOSIS — R531 Weakness: Secondary | ICD-10-CM | POA: Diagnosis not present

## 2020-03-26 DIAGNOSIS — M25561 Pain in right knee: Secondary | ICD-10-CM | POA: Diagnosis not present

## 2020-03-26 DIAGNOSIS — M25562 Pain in left knee: Secondary | ICD-10-CM | POA: Diagnosis not present

## 2020-03-26 DIAGNOSIS — Z4789 Encounter for other orthopedic aftercare: Secondary | ICD-10-CM | POA: Diagnosis not present

## 2020-03-26 DIAGNOSIS — R2689 Other abnormalities of gait and mobility: Secondary | ICD-10-CM | POA: Diagnosis not present

## 2020-03-26 DIAGNOSIS — M25661 Stiffness of right knee, not elsewhere classified: Secondary | ICD-10-CM | POA: Diagnosis not present

## 2020-03-26 DIAGNOSIS — M25662 Stiffness of left knee, not elsewhere classified: Secondary | ICD-10-CM | POA: Diagnosis not present

## 2020-03-28 DIAGNOSIS — M25661 Stiffness of right knee, not elsewhere classified: Secondary | ICD-10-CM | POA: Diagnosis not present

## 2020-03-28 DIAGNOSIS — M25662 Stiffness of left knee, not elsewhere classified: Secondary | ICD-10-CM | POA: Diagnosis not present

## 2020-03-28 DIAGNOSIS — M25562 Pain in left knee: Secondary | ICD-10-CM | POA: Diagnosis not present

## 2020-03-28 DIAGNOSIS — M25561 Pain in right knee: Secondary | ICD-10-CM | POA: Diagnosis not present

## 2020-03-28 DIAGNOSIS — R2689 Other abnormalities of gait and mobility: Secondary | ICD-10-CM | POA: Diagnosis not present

## 2020-03-28 DIAGNOSIS — R531 Weakness: Secondary | ICD-10-CM | POA: Diagnosis not present

## 2020-03-28 DIAGNOSIS — Z4789 Encounter for other orthopedic aftercare: Secondary | ICD-10-CM | POA: Diagnosis not present

## 2020-04-01 ENCOUNTER — Ambulatory Visit (INDEPENDENT_AMBULATORY_CARE_PROVIDER_SITE_OTHER): Payer: Medicare Other | Admitting: *Deleted

## 2020-04-01 ENCOUNTER — Other Ambulatory Visit: Payer: Self-pay

## 2020-04-01 DIAGNOSIS — I480 Paroxysmal atrial fibrillation: Secondary | ICD-10-CM

## 2020-04-01 DIAGNOSIS — Z5181 Encounter for therapeutic drug level monitoring: Secondary | ICD-10-CM | POA: Insufficient documentation

## 2020-04-01 DIAGNOSIS — I4891 Unspecified atrial fibrillation: Secondary | ICD-10-CM | POA: Insufficient documentation

## 2020-04-01 LAB — POCT INR: INR: 1.1 — AB (ref 2.0–3.0)

## 2020-04-01 MED ORDER — WARFARIN SODIUM 5 MG PO TABS: 5.0000 mg | ORAL_TABLET | Freq: Every day | ORAL | 3 refills | Status: DC

## 2020-04-01 NOTE — Patient Instructions (Signed)
Here to change from Eliquis to warfarin due to cost. Start warfarin 5mg  daily.  Overlap with Eliquis x 3 days (Today,Tuesday,Wednesday) then stop Eliquis and continue warfarin alone. Recheck in 1 week

## 2020-04-02 DIAGNOSIS — M25562 Pain in left knee: Secondary | ICD-10-CM | POA: Diagnosis not present

## 2020-04-02 DIAGNOSIS — Z4789 Encounter for other orthopedic aftercare: Secondary | ICD-10-CM | POA: Diagnosis not present

## 2020-04-02 DIAGNOSIS — R531 Weakness: Secondary | ICD-10-CM | POA: Diagnosis not present

## 2020-04-02 DIAGNOSIS — R2689 Other abnormalities of gait and mobility: Secondary | ICD-10-CM | POA: Diagnosis not present

## 2020-04-02 DIAGNOSIS — M25661 Stiffness of right knee, not elsewhere classified: Secondary | ICD-10-CM | POA: Diagnosis not present

## 2020-04-02 DIAGNOSIS — M25662 Stiffness of left knee, not elsewhere classified: Secondary | ICD-10-CM | POA: Diagnosis not present

## 2020-04-02 DIAGNOSIS — M25561 Pain in right knee: Secondary | ICD-10-CM | POA: Diagnosis not present

## 2020-04-04 DIAGNOSIS — Z4789 Encounter for other orthopedic aftercare: Secondary | ICD-10-CM | POA: Diagnosis not present

## 2020-04-04 DIAGNOSIS — R531 Weakness: Secondary | ICD-10-CM | POA: Diagnosis not present

## 2020-04-04 DIAGNOSIS — M25561 Pain in right knee: Secondary | ICD-10-CM | POA: Diagnosis not present

## 2020-04-04 DIAGNOSIS — M25562 Pain in left knee: Secondary | ICD-10-CM | POA: Diagnosis not present

## 2020-04-04 DIAGNOSIS — R2689 Other abnormalities of gait and mobility: Secondary | ICD-10-CM | POA: Diagnosis not present

## 2020-04-04 DIAGNOSIS — M25661 Stiffness of right knee, not elsewhere classified: Secondary | ICD-10-CM | POA: Diagnosis not present

## 2020-04-04 DIAGNOSIS — M25662 Stiffness of left knee, not elsewhere classified: Secondary | ICD-10-CM | POA: Diagnosis not present

## 2020-04-05 DIAGNOSIS — R2689 Other abnormalities of gait and mobility: Secondary | ICD-10-CM | POA: Diagnosis not present

## 2020-04-05 DIAGNOSIS — Z4789 Encounter for other orthopedic aftercare: Secondary | ICD-10-CM | POA: Diagnosis not present

## 2020-04-05 DIAGNOSIS — R531 Weakness: Secondary | ICD-10-CM | POA: Diagnosis not present

## 2020-04-05 DIAGNOSIS — M25662 Stiffness of left knee, not elsewhere classified: Secondary | ICD-10-CM | POA: Diagnosis not present

## 2020-04-05 DIAGNOSIS — M25562 Pain in left knee: Secondary | ICD-10-CM | POA: Diagnosis not present

## 2020-04-05 DIAGNOSIS — M25661 Stiffness of right knee, not elsewhere classified: Secondary | ICD-10-CM | POA: Diagnosis not present

## 2020-04-05 DIAGNOSIS — M25561 Pain in right knee: Secondary | ICD-10-CM | POA: Diagnosis not present

## 2020-04-08 ENCOUNTER — Ambulatory Visit (INDEPENDENT_AMBULATORY_CARE_PROVIDER_SITE_OTHER): Payer: Medicare Other | Admitting: *Deleted

## 2020-04-08 ENCOUNTER — Other Ambulatory Visit: Payer: Self-pay | Admitting: Physician Assistant

## 2020-04-08 DIAGNOSIS — I480 Paroxysmal atrial fibrillation: Secondary | ICD-10-CM | POA: Diagnosis not present

## 2020-04-08 DIAGNOSIS — Z5181 Encounter for therapeutic drug level monitoring: Secondary | ICD-10-CM

## 2020-04-08 DIAGNOSIS — I10 Essential (primary) hypertension: Secondary | ICD-10-CM | POA: Diagnosis not present

## 2020-04-08 DIAGNOSIS — E039 Hypothyroidism, unspecified: Secondary | ICD-10-CM | POA: Diagnosis not present

## 2020-04-08 LAB — POCT INR: INR: 1.5 — AB (ref 2.0–3.0)

## 2020-04-08 NOTE — Patient Instructions (Signed)
Increase warfarin to 1 tablet daily except 1 1/2 tablets on Mondays and Thursdays Recheck in 1 week

## 2020-04-09 DIAGNOSIS — Z4789 Encounter for other orthopedic aftercare: Secondary | ICD-10-CM | POA: Diagnosis not present

## 2020-04-09 DIAGNOSIS — M25561 Pain in right knee: Secondary | ICD-10-CM | POA: Diagnosis not present

## 2020-04-09 DIAGNOSIS — M25662 Stiffness of left knee, not elsewhere classified: Secondary | ICD-10-CM | POA: Diagnosis not present

## 2020-04-09 DIAGNOSIS — R531 Weakness: Secondary | ICD-10-CM | POA: Diagnosis not present

## 2020-04-09 DIAGNOSIS — R2689 Other abnormalities of gait and mobility: Secondary | ICD-10-CM | POA: Diagnosis not present

## 2020-04-09 DIAGNOSIS — M25562 Pain in left knee: Secondary | ICD-10-CM | POA: Diagnosis not present

## 2020-04-09 DIAGNOSIS — M25661 Stiffness of right knee, not elsewhere classified: Secondary | ICD-10-CM | POA: Diagnosis not present

## 2020-04-09 LAB — BASIC METABOLIC PANEL WITH GFR
BUN: 11 mg/dL (ref 7–25)
CO2: 30 mmol/L (ref 20–32)
Calcium: 9.1 mg/dL (ref 8.6–10.4)
Chloride: 105 mmol/L (ref 98–110)
Creat: 0.87 mg/dL (ref 0.50–0.99)
GFR, Est African American: 79 mL/min/{1.73_m2} (ref >=60)
GFR, Est Non African American: 68 mL/min/{1.73_m2} (ref >=60)
Glucose, Bld: 90 mg/dL (ref 65–139)
Potassium: 4.1 mmol/L (ref 3.5–5.3)
Sodium: 141 mmol/L (ref 135–146)

## 2020-04-09 LAB — CBC
HCT: 40 % (ref 35.0–45.0)
Hemoglobin: 13.5 g/dL (ref 11.7–15.5)
MCH: 30.5 pg (ref 27.0–33.0)
MCHC: 33.8 g/dL (ref 32.0–36.0)
MCV: 90.5 fL (ref 80.0–100.0)
MPV: 10.8 fL (ref 7.5–12.5)
Platelets: 226 10*3/uL (ref 140–400)
RBC: 4.42 10*6/uL (ref 3.80–5.10)
RDW: 12.8 % (ref 11.0–15.0)
WBC: 4.4 10*3/uL (ref 3.8–10.8)

## 2020-04-11 DIAGNOSIS — M25562 Pain in left knee: Secondary | ICD-10-CM | POA: Diagnosis not present

## 2020-04-11 DIAGNOSIS — R2689 Other abnormalities of gait and mobility: Secondary | ICD-10-CM | POA: Diagnosis not present

## 2020-04-11 DIAGNOSIS — R531 Weakness: Secondary | ICD-10-CM | POA: Diagnosis not present

## 2020-04-11 DIAGNOSIS — M25561 Pain in right knee: Secondary | ICD-10-CM | POA: Diagnosis not present

## 2020-04-11 DIAGNOSIS — M25662 Stiffness of left knee, not elsewhere classified: Secondary | ICD-10-CM | POA: Diagnosis not present

## 2020-04-11 DIAGNOSIS — Z4789 Encounter for other orthopedic aftercare: Secondary | ICD-10-CM | POA: Diagnosis not present

## 2020-04-11 DIAGNOSIS — M25661 Stiffness of right knee, not elsewhere classified: Secondary | ICD-10-CM | POA: Diagnosis not present

## 2020-04-12 ENCOUNTER — Encounter: Payer: Self-pay | Admitting: Cardiology

## 2020-04-12 ENCOUNTER — Other Ambulatory Visit: Payer: Self-pay

## 2020-04-12 ENCOUNTER — Ambulatory Visit (INDEPENDENT_AMBULATORY_CARE_PROVIDER_SITE_OTHER): Payer: Medicare Other | Admitting: Cardiology

## 2020-04-12 VITALS — BP 130/80 | HR 84 | Ht 64.0 in | Wt 187.0 lb

## 2020-04-12 DIAGNOSIS — I480 Paroxysmal atrial fibrillation: Secondary | ICD-10-CM | POA: Diagnosis not present

## 2020-04-12 DIAGNOSIS — R531 Weakness: Secondary | ICD-10-CM | POA: Diagnosis not present

## 2020-04-12 DIAGNOSIS — R2689 Other abnormalities of gait and mobility: Secondary | ICD-10-CM | POA: Diagnosis not present

## 2020-04-12 DIAGNOSIS — M25562 Pain in left knee: Secondary | ICD-10-CM | POA: Diagnosis not present

## 2020-04-12 DIAGNOSIS — Z4789 Encounter for other orthopedic aftercare: Secondary | ICD-10-CM | POA: Diagnosis not present

## 2020-04-12 DIAGNOSIS — M25561 Pain in right knee: Secondary | ICD-10-CM | POA: Diagnosis not present

## 2020-04-12 DIAGNOSIS — M25662 Stiffness of left knee, not elsewhere classified: Secondary | ICD-10-CM | POA: Diagnosis not present

## 2020-04-12 DIAGNOSIS — M25661 Stiffness of right knee, not elsewhere classified: Secondary | ICD-10-CM | POA: Diagnosis not present

## 2020-04-12 NOTE — Patient Instructions (Signed)
Your physician recommends that you schedule a follow-up appointment in: 6 MONTHS WITH DR BRANCH  Your physician recommends that you continue on your current medications as directed. Please refer to the Current Medication list given to you today.  Thank you for choosing [facility]!!    

## 2020-04-12 NOTE — Progress Notes (Signed)
Clinical Summary Melissa Velazquez is a 70 y.o.female seen today for follow up of the following medical problems.   1. PAF - She underwent bilateral total knee replacements in 01/2020.  Her post op course was c/b AFib w RVR. Jan 29 2020 EKG review confirms afib - wanted to chagne from NOAC to coumadin due to cost.   - no recent palpitations - no bleeding on coumadin.     Works as Engineer, water, has phD.    Past Medical History:  Diagnosis Date  . Allergic rhinitis   . Anal fissure    Hx of   . Anxiety    hx of  . Arthritis   . Asthma    none in last year   . Chronic kidney disease    partial nephrectomy  . Depression    hx of  . Diabetes mellitus without complication (HCC)    no meds  . Diverticular disease   . Dysrhythmia    palpitations  . Eczema   . Endometrial cancer ([place]) 2001   spread to appendix   . Hemorrhoids   . Hyperglycemia   . Hypertension   . Hypothyroidism   . Kidney cancer, primary, with metastasis from kidney to other site [facility])   . Obesity   . Sebaceous cyst   . Thyroid disease   . Thyroid nodule   . 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 Triple Antibiotic [Bacitracin-Neomycin-Polymyxin] Anaphylaxis  . Levofloxacin Other (See Comments)    Stomach upset  . Oxycodone     Tremors and brain fog  . Capsaicin Rash  . Elastic Bandages & [Zinc] Rash  . Nickel Itching and Rash     Current Outpatient Medications  Medication Sig Dispense Refill  . albuterol (PROVENTIL HFA;VENTOLIN HFA) 108 (90 Base) MCG/ACT inhaler Inhale 2 puffs into the lungs every 6 (six) hours as needed for wheezing or shortness of breath.    . cetirizine (ZYRTEC) 10 MG tablet Take 10 mg by mouth daily as needed for allergies.    Marland Kitchen diclofenac Sodium (VOLTAREN) 1 % GEL Apply 4 g topically 4 (four) times daily. 350 g 0  . fluconazole (DIFLUCAN) 150 MG tablet SMARTSIG:1 Tablet(s) By Mouth Every 4 Days    . fluticasone (FLONASE)  50 MCG/ACT nasal spray Place 1 spray into both nostrils 2 (two) times daily as needed for allergies or rhinitis. 16 g 5  . gabapentin (NEURONTIN) 300 MG capsule Take a 300 mg capsule three times a day till 01/26. Then decrease to 300 mg capsule two times a day for two weeks. Then take a 300 mg capsule once a day for two weeks. Then discontinue. 52 capsule 0  . levothyroxine (SYNTHROID, LEVOTHROID) 75 MCG tablet Take 75 mcg by mouth daily before breakfast.    . metoprolol tartrate (LOPRESSOR) 25 MG tablet Take 0.5 tablets (12.5 mg total) by mouth as needed (for palpitations). 45 tablet 3  . nystatin (MYCOSTATIN) 100000 UNIT/ML suspension Take 5 mLs (500,000 Units total) by mouth 4 (four) times daily. Swish and swallow 473 mL 0  . pantoprazole (PROTONIX) 20 MG tablet Take 1 tablet (20 mg total) by mouth daily. 30 tablet 0  . sertraline (ZOLOFT) 100 MG tablet Take 150 mg by mouth daily with breakfast.     . traMADol (ULTRAM) 50 MG tablet Take 1-2 tablets (50-100 mg total) by mouth every 6 (six) hours as needed for severe pain. 52 tablet 0  .  triamcinolone cream (KENALOG) 0.1 % Apply 1 application topically daily as needed (rash).    . warfarin (COUMADIN) 5 MG tablet Take 1 tablet (5 mg total) by mouth daily. 45 tablet 3   No current facility-administered medications for this visit.     Past Surgical History:  Procedure Laterality Date  . APPENDECTOMY  2008  . BIOPSY  12/14/2016   Procedure: BIOPSY;  Surgeon: Rogene Houston, MD;  Location: [facility];  Service: Endoscopy;;  colon  . CHOLECYSTECTOMY  2008  . COLONOSCOPY N/A 11/24/2012   Procedure: COLONOSCOPY;  Surgeon: Rogene Houston, MD;  Location: [facility];  Service: Endoscopy;  Laterality: N/A;  830  . COLONOSCOPY N/A 12/14/2016   Procedure: COLONOSCOPY;  Surgeon: Rogene Houston, MD;  Location: [facility];  Service: Endoscopy;  Laterality: N/A;  12:00  . POLYPECTOMY  12/14/2016   Procedure: POLYPECTOMY;  Surgeon: Rogene Houston, MD;  Location: [facility];  Service: Endoscopy;;  colon  . ROBOTIC ASSITED PARTIAL NEPHRECTOMY Left 08/21/2016   Procedure: XI ROBOTIC ASSITED PARTIAL NEPHRECTOMY;  Surgeon: Ardis Hughs, MD;  Location: [facility];  Service: Urology;  Laterality: Left;  . TOTAL ABDOMINAL HYSTERECTOMY  2001  . TOTAL KNEE ARTHROPLASTY Bilateral 01/24/2020   Procedure: TOTAL KNEE BILATERAL;  Surgeon: Gaynelle Arabian, MD;  Location: [facility];  Service: Orthopedics;  Laterality: Bilateral;  . WISDOM TOOTH EXTRACTION       Allergies  Allergen Reactions  . Triple Antibiotic [Bacitracin-Neomycin-Polymyxin] Anaphylaxis  . Levofloxacin Other (See Comments)    Stomach upset  . Oxycodone     Tremors and brain fog  . Capsaicin Rash  . Elastic Bandages & [Zinc] Rash  . Nickel Itching and Rash      Family History  Problem Relation Age of Onset  . Breast cancer Mother   . Diabetes Mother   . Obesity Sister   . Asthma Sister   . Eczema Sister   . COPD Father        smoked  . Prostate cancer Father   . Breast cancer Maternal Aunt   . Urticaria Maternal Grandfather   . Colon cancer Neg Hx      Social History Melissa Velazquez reports that she quit smoking about 22 years ago. Her smoking use included cigarettes. She has a 20.00 pack-year smoking history. She has never used smokeless tobacco. Melissa Velazquez reports current alcohol use.   Review of Systems CONSTITUTIONAL: No weight loss, fever, chills, weakness or fatigue.  HEENT: Eyes: No visual loss, blurred vision, double vision or yellow sclerae.No hearing loss, sneezing, congestion, runny nose or sore throat.  SKIN: No rash or itching.  CARDIOVASCULAR: per hpi RESPIRATORY: No shortness of breath, cough or sputum.  GASTROINTESTINAL: No anorexia, nausea, vomiting or diarrhea. No abdominal pain or blood.  GENITOURINARY: No burning on urination, no polyuria NEUROLOGICAL: No headache, dizziness, syncope, paralysis, ataxia, numbness or tingling in the  extremities. No change in bowel or bladder control.  MUSCULOSKELETAL: No muscle, back pain, joint pain or stiffness.  LYMPHATICS: No enlarged nodes. No history of splenectomy.  PSYCHIATRIC: No history of depression or anxiety.  ENDOCRINOLOGIC: No reports of sweating, cold or heat intolerance. No polyuria or polydipsia.  Marland Kitchen   Physical Examination Today's Vitals   04/12/20 1449  BP: 130/80  Pulse: 84  SpO2: 95%  Weight: 187 lb (84.8 kg)  Height: 5\' 4"  (1.626 m)   Body mass index is 32.1 kg/m.  Gen: resting comfortably, no acute  distress HEENT: no scleral icterus, pupils equal round and reactive, no palptable cervical adenopathy,  CV: RRR, no m/rg, no jvd Resp: Clear to auscultation bilaterally GI: abdomen is soft, non-tender, non-distended, normal bowel sounds, no hepatosplenomegaly MSK: extremities are warm, no edema.  Skin: warm, no rash Neuro:  no focal deficits Psych: appropriate affect   Diagnostic Studies  Jan 2022 echo  IMPRESSIONS    1. Left ventricular ejection fraction, by estimation, is 60 to 65%. The  left ventricle has normal function. The left ventricle has no regional  wall motion abnormalities. There is mild concentric left ventricular  hypertrophy and moderate basal septal  hypertrophy.  2. Right ventricular systolic function is mildly reduced. The right  ventricular size is normal. There is moderately elevated pulmonary artery  systolic pressure. The estimated right ventricular systolic pressure is  01.0 mmHg.  3. Large pleural effusion in the left lateral region.  4. The mitral valve is normal in structure. No evidence of mitral valve  regurgitation. No evidence of mitral stenosis.  5. The aortic valve is tricuspid. Aortic valve regurgitation is not  visualized. Mild aortic valve sclerosis is present, with no evidence of  aortic valve stenosis. Aortic valve area, by VTI measures 2.41 cm. Aortic  valve mean gradient measures 4.0 mmHg.   Aortic valve Vmax measures 1.51 m/s.  6. The inferior vena cava is dilated in size with <50% respiratory  variability, suggesting right atrial pressure of 15 mmHg.  7. Tricuspid valve regurgitation is mild to moderate.    Assessment and Plan  1. PAF - fairly new diagnosis during Jan 2022 admission - denies any significant recent symptoms, currently on low dose lopressor prn - NOACs were expensive, has transitioned to coumadin. Will continue     Arnoldo Lenis, M.D.

## 2020-04-15 ENCOUNTER — Ambulatory Visit (INDEPENDENT_AMBULATORY_CARE_PROVIDER_SITE_OTHER): Payer: Medicare Other | Admitting: *Deleted

## 2020-04-15 ENCOUNTER — Other Ambulatory Visit: Payer: Self-pay

## 2020-04-15 DIAGNOSIS — Z5181 Encounter for therapeutic drug level monitoring: Secondary | ICD-10-CM

## 2020-04-15 DIAGNOSIS — I480 Paroxysmal atrial fibrillation: Secondary | ICD-10-CM

## 2020-04-15 LAB — POCT INR: INR: 1.5 — AB (ref 2.0–3.0)

## 2020-04-15 NOTE — Patient Instructions (Signed)
Increase warfarin to 1 1/2 tablet daily except 1 tablet on Sundays and Thursdays Recheck in 1 week

## 2020-04-16 ENCOUNTER — Ambulatory Visit (INDEPENDENT_AMBULATORY_CARE_PROVIDER_SITE_OTHER): Payer: Medicare Other | Admitting: Internal Medicine

## 2020-04-16 ENCOUNTER — Encounter (INDEPENDENT_AMBULATORY_CARE_PROVIDER_SITE_OTHER): Payer: Self-pay | Admitting: Internal Medicine

## 2020-04-16 VITALS — BP 126/79 | HR 86 | Temp 98.9°F | Ht 64.0 in | Wt 187.1 lb

## 2020-04-16 DIAGNOSIS — K580 Irritable bowel syndrome with diarrhea: Secondary | ICD-10-CM

## 2020-04-16 DIAGNOSIS — M25562 Pain in left knee: Secondary | ICD-10-CM | POA: Diagnosis not present

## 2020-04-16 DIAGNOSIS — M25661 Stiffness of right knee, not elsewhere classified: Secondary | ICD-10-CM | POA: Diagnosis not present

## 2020-04-16 DIAGNOSIS — M25561 Pain in right knee: Secondary | ICD-10-CM | POA: Diagnosis not present

## 2020-04-16 DIAGNOSIS — R2689 Other abnormalities of gait and mobility: Secondary | ICD-10-CM | POA: Diagnosis not present

## 2020-04-16 DIAGNOSIS — R531 Weakness: Secondary | ICD-10-CM | POA: Diagnosis not present

## 2020-04-16 DIAGNOSIS — Z4789 Encounter for other orthopedic aftercare: Secondary | ICD-10-CM | POA: Diagnosis not present

## 2020-04-16 DIAGNOSIS — Z86010 Personal history of colonic polyps: Secondary | ICD-10-CM | POA: Diagnosis not present

## 2020-04-16 DIAGNOSIS — M25662 Stiffness of left knee, not elsewhere classified: Secondary | ICD-10-CM | POA: Diagnosis not present

## 2020-04-16 NOTE — Progress Notes (Signed)
Presenting complaint;  Follow-up for IBS.  Database and subjective:  Patient is 70 year old Caucasian female who has a history of irritable bowel syndrome with diarrhea as well as history of colonic adenomas who is here for scheduled visit.  She was last seen 1 year ago. Since her last visit she has lost 37 pounds.  She states most of this weight loss is voluntary which she was able to do prior to her bilateral knee replacement on 01/24/2020.  She developed atrial fibrillation during hospitalization and was begun on apixaban and now switched to warfarin as insurance would not pay for apixaban.   Patient says she has formed stools on most days.  She may have a couple of episodes of diarrhea in a month and she may rarely feel constipated.  She has never needed any laxative.  She denies melena or rectal bleeding.  She has not experienced accidents or incontinence.  She also denies nocturnal diarrhea.  She is not taking any medication for IBS.  She also stopped her pantoprazole 2 months ago.  She was only on low-dose.  Review of her recent hospitalization reveals that she had mild bump in her transaminases on 01/31/2020 but transaminases are normal 5 days later. She is receiving physical therapy 3 times a week. Now she is working an average of 15 hours/week.  Her last colonoscopy was November 2018 with removal of tubular adenoma from cecum.  She has history of adenomas.  Current Medications: Outpatient Encounter Medications as of 04/16/2020  Medication Sig  . acetaminophen (TYLENOL) 500 MG tablet Take 500 mg by mouth every 6 (six) hours as needed.  Marland Kitchen albuterol (PROVENTIL HFA;VENTOLIN HFA) 108 (90 Base) MCG/ACT inhaler Inhale 2 puffs into the lungs every 6 (six) hours as needed for wheezing or shortness of breath.  . cetirizine (ZYRTEC) 10 MG tablet Take 10 mg by mouth daily as needed for allergies.  Marland Kitchen diclofenac Sodium (VOLTAREN) 1 % GEL Apply 4 g topically 4 (four) times daily.  . fluticasone  (FLONASE) 50 MCG/ACT nasal spray Place 1 spray into both nostrils 2 (two) times daily as needed for allergies or rhinitis.  Marland Kitchen levothyroxine (SYNTHROID, LEVOTHROID) 75 MCG tablet Take 75 mcg by mouth daily before breakfast.  . metoprolol tartrate (LOPRESSOR) 25 MG tablet Take 0.5 tablets (12.5 mg total) by mouth as needed (for palpitations).  . sertraline (ZOLOFT) 100 MG tablet Take 150 mg by mouth daily with breakfast.  . triamcinolone cream (KENALOG) 0.1 % Apply 1 application topically daily as needed (rash).  . warfarin (COUMADIN) 5 MG tablet Take 1 tablet (5 mg total) by mouth daily.  . pantoprazole (PROTONIX) 20 MG tablet Take 1 tablet (20 mg total) by mouth daily. (Patient not taking: Reported on 04/16/2020)  . [DISCONTINUED] fluconazole (DIFLUCAN) 150 MG tablet SMARTSIG:1 Tablet(s) By Mouth Every 4 Days (Patient not taking: Reported on 04/16/2020)  . [DISCONTINUED] nystatin (MYCOSTATIN) 100000 UNIT/ML suspension Take 5 mLs (500,000 Units total) by mouth 4 (four) times daily. Swish and swallow (Patient not taking: Reported on 04/16/2020)   No facility-administered encounter medications on file as of 04/16/2020.     Objective: Blood pressure 126/79, pulse 86, temperature 98.9 F (37.2 C), temperature source Oral, height $RemoveBefo'5\' 4"'MNJODwtCMIx$  (1.626 m), weight 187 lb 1.6 oz (84.9 kg). Patient is alert and in no acute distress. Conjunctiva is pink. Sclera is nonicteric Oropharyngeal mucosa is normal. No neck masses or thyromegaly noted. Cardiac exam with regular rhythm normal S1 and S2. No murmur or gallop noted. Lungs are  clear to auscultation. Abdomen is symmetrical.  She has low midline scar.  Bowel sounds are normal.  On palpation abdomen is soft and nontender with organomegaly or masses.  Liver edge is palpable is soft and nontender. No LE edema or clubbing noted.  Labs/studies Results:  CBC Latest Ref Rng & Units 04/08/2020 02/05/2020 01/31/2020  WBC 3.8 - 10.8 Thousand/uL 4.4 5.5 7.7  Hemoglobin 11.7  - 15.5 g/dL 13.5 10.6(L) 10.7(L)  Hematocrit 35.0 - 45.0 % 40.0 34.2(L) 31.7(L)  Platelets 140 - 400 Thousand/uL 226 292 259    CMP Latest Ref Rng & Units 04/08/2020 02/05/2020 01/31/2020  Glucose 65 - 139 mg/dL 90 99 141(H)  BUN 7 - 25 mg/dL $Remove'11 11 13  'RevLyYX$ Creatinine 0.50 - 0.99 mg/dL 0.87 0.90 0.97  Sodium 135 - 146 mmol/L 141 141 136  Potassium 3.5 - 5.3 mmol/L 4.1 4.8 3.7  Chloride 98 - 110 mmol/L 105 103 98  CO2 20 - 32 mmol/L $RemoveB'30 25 27  'ZyDCCIGX$ Calcium 8.6 - 10.4 mg/dL 9.1 9.3 8.3(L)  Total Protein 6.5 - 8.1 g/dL - 6.2(L) 5.9(L)  Total Bilirubin 0.3 - 1.2 mg/dL - 1.1 1.0  Alkaline Phos 38 - 126 U/L - 74 96  AST 15 - 41 U/L - 25 54(H)  ALT 0 - 44 U/L - 39 116(H)    Hepatic Function Latest Ref Rng & Units 02/05/2020 01/31/2020 01/18/2020  Total Protein 6.5 - 8.1 g/dL 6.2(L) 5.9(L) 6.8  Albumin 3.5 - 5.0 g/dL 3.0(L) 2.7(L) 4.0  AST 15 - 41 U/L 25 54(H) 23  ALT 0 - 44 U/L 39 116(H) 18  Alk Phosphatase 38 - 126 U/L 74 96 59  Total Bilirubin 0.3 - 1.2 mg/dL 1.1 1.0 0.6      Assessment:  #1.  Stable bowel syndrome with diarrhea.  She appears to be back in remission.  She is not requiring antispasmodic or antidiarrheal.  #2.  Mild increase in transaminases during hospitalization in January this year for bilateral knee replacement.  Review of CT from November 2021 revealed no abnormality to liver or bile duct.  No further work-up other than repeat LFTs by Dr. Hilma Favors in 3 to 6 months.  #3.  History of colonic adenomas.  She will be due for surveillance colonoscopy in November 2023.  Plan:  Medication list updated. Patient advised not to take OTC laxatives in case she becomes constipated but she can use MiraLAX and/or Dulcolax suppository on as-needed basis. He can use Pepcid OTC 20 mg daily as needed for heartburn. Office visit in 1 year.

## 2020-04-16 NOTE — Patient Instructions (Signed)
Can use Pepcid OTC 20 mg on as-needed basis for heartburn. Can use MiraLAX or Dulcolax suppository on as-needed basis for constipation. Please asked Dr. Hilma Favors to check her LFTs with your next blood work in 3 to 6 months.

## 2020-04-18 DIAGNOSIS — Z4789 Encounter for other orthopedic aftercare: Secondary | ICD-10-CM | POA: Diagnosis not present

## 2020-04-18 DIAGNOSIS — R531 Weakness: Secondary | ICD-10-CM | POA: Diagnosis not present

## 2020-04-18 DIAGNOSIS — M25662 Stiffness of left knee, not elsewhere classified: Secondary | ICD-10-CM | POA: Diagnosis not present

## 2020-04-18 DIAGNOSIS — M25562 Pain in left knee: Secondary | ICD-10-CM | POA: Diagnosis not present

## 2020-04-18 DIAGNOSIS — R2689 Other abnormalities of gait and mobility: Secondary | ICD-10-CM | POA: Diagnosis not present

## 2020-04-18 DIAGNOSIS — M25561 Pain in right knee: Secondary | ICD-10-CM | POA: Diagnosis not present

## 2020-04-18 DIAGNOSIS — M25661 Stiffness of right knee, not elsewhere classified: Secondary | ICD-10-CM | POA: Diagnosis not present

## 2020-04-19 ENCOUNTER — Ambulatory Visit: Payer: Medicare Other | Admitting: Allergy & Immunology

## 2020-04-19 DIAGNOSIS — M25561 Pain in right knee: Secondary | ICD-10-CM | POA: Diagnosis not present

## 2020-04-19 DIAGNOSIS — M25562 Pain in left knee: Secondary | ICD-10-CM | POA: Diagnosis not present

## 2020-04-19 DIAGNOSIS — Z4789 Encounter for other orthopedic aftercare: Secondary | ICD-10-CM | POA: Diagnosis not present

## 2020-04-19 DIAGNOSIS — M25661 Stiffness of right knee, not elsewhere classified: Secondary | ICD-10-CM | POA: Diagnosis not present

## 2020-04-19 DIAGNOSIS — M25662 Stiffness of left knee, not elsewhere classified: Secondary | ICD-10-CM | POA: Diagnosis not present

## 2020-04-19 DIAGNOSIS — R2689 Other abnormalities of gait and mobility: Secondary | ICD-10-CM | POA: Diagnosis not present

## 2020-04-19 DIAGNOSIS — R531 Weakness: Secondary | ICD-10-CM | POA: Diagnosis not present

## 2020-04-22 ENCOUNTER — Ambulatory Visit (INDEPENDENT_AMBULATORY_CARE_PROVIDER_SITE_OTHER): Payer: Medicare Other | Admitting: *Deleted

## 2020-04-22 ENCOUNTER — Other Ambulatory Visit: Payer: Self-pay

## 2020-04-22 DIAGNOSIS — I480 Paroxysmal atrial fibrillation: Secondary | ICD-10-CM

## 2020-04-22 DIAGNOSIS — Z5181 Encounter for therapeutic drug level monitoring: Secondary | ICD-10-CM | POA: Diagnosis not present

## 2020-04-22 LAB — POCT INR: INR: 1.8 — AB (ref 2.0–3.0)

## 2020-04-22 NOTE — Patient Instructions (Signed)
Increase warfarin to 1 1/2 tablet daily  Recheck in 1 week

## 2020-04-24 DIAGNOSIS — M25661 Stiffness of right knee, not elsewhere classified: Secondary | ICD-10-CM | POA: Diagnosis not present

## 2020-04-24 DIAGNOSIS — R2689 Other abnormalities of gait and mobility: Secondary | ICD-10-CM | POA: Diagnosis not present

## 2020-04-24 DIAGNOSIS — M25561 Pain in right knee: Secondary | ICD-10-CM | POA: Diagnosis not present

## 2020-04-24 DIAGNOSIS — R531 Weakness: Secondary | ICD-10-CM | POA: Diagnosis not present

## 2020-04-24 DIAGNOSIS — Z4789 Encounter for other orthopedic aftercare: Secondary | ICD-10-CM | POA: Diagnosis not present

## 2020-04-24 DIAGNOSIS — M25562 Pain in left knee: Secondary | ICD-10-CM | POA: Diagnosis not present

## 2020-04-24 DIAGNOSIS — M25662 Stiffness of left knee, not elsewhere classified: Secondary | ICD-10-CM | POA: Diagnosis not present

## 2020-04-26 DIAGNOSIS — Z4789 Encounter for other orthopedic aftercare: Secondary | ICD-10-CM | POA: Diagnosis not present

## 2020-04-26 DIAGNOSIS — R531 Weakness: Secondary | ICD-10-CM | POA: Diagnosis not present

## 2020-04-26 DIAGNOSIS — R2689 Other abnormalities of gait and mobility: Secondary | ICD-10-CM | POA: Diagnosis not present

## 2020-04-26 DIAGNOSIS — M25562 Pain in left knee: Secondary | ICD-10-CM | POA: Diagnosis not present

## 2020-04-26 DIAGNOSIS — M25661 Stiffness of right knee, not elsewhere classified: Secondary | ICD-10-CM | POA: Diagnosis not present

## 2020-04-26 DIAGNOSIS — M25662 Stiffness of left knee, not elsewhere classified: Secondary | ICD-10-CM | POA: Diagnosis not present

## 2020-04-26 DIAGNOSIS — M25561 Pain in right knee: Secondary | ICD-10-CM | POA: Diagnosis not present

## 2020-04-30 ENCOUNTER — Ambulatory Visit (INDEPENDENT_AMBULATORY_CARE_PROVIDER_SITE_OTHER): Payer: Medicare Other | Admitting: *Deleted

## 2020-04-30 DIAGNOSIS — Z5181 Encounter for therapeutic drug level monitoring: Secondary | ICD-10-CM | POA: Diagnosis not present

## 2020-04-30 DIAGNOSIS — M25662 Stiffness of left knee, not elsewhere classified: Secondary | ICD-10-CM | POA: Diagnosis not present

## 2020-04-30 DIAGNOSIS — I480 Paroxysmal atrial fibrillation: Secondary | ICD-10-CM

## 2020-04-30 DIAGNOSIS — R2689 Other abnormalities of gait and mobility: Secondary | ICD-10-CM | POA: Diagnosis not present

## 2020-04-30 DIAGNOSIS — M25561 Pain in right knee: Secondary | ICD-10-CM | POA: Diagnosis not present

## 2020-04-30 DIAGNOSIS — M25661 Stiffness of right knee, not elsewhere classified: Secondary | ICD-10-CM | POA: Diagnosis not present

## 2020-04-30 DIAGNOSIS — R531 Weakness: Secondary | ICD-10-CM | POA: Diagnosis not present

## 2020-04-30 DIAGNOSIS — Z4789 Encounter for other orthopedic aftercare: Secondary | ICD-10-CM | POA: Diagnosis not present

## 2020-04-30 DIAGNOSIS — M25562 Pain in left knee: Secondary | ICD-10-CM | POA: Diagnosis not present

## 2020-04-30 LAB — POCT INR: INR: 2.3 (ref 2.0–3.0)

## 2020-04-30 NOTE — Patient Instructions (Signed)
Continue warfarin 1 1/2 tablet daily  Recheck in 2 weeks

## 2020-05-02 DIAGNOSIS — R2689 Other abnormalities of gait and mobility: Secondary | ICD-10-CM | POA: Diagnosis not present

## 2020-05-02 DIAGNOSIS — M25661 Stiffness of right knee, not elsewhere classified: Secondary | ICD-10-CM | POA: Diagnosis not present

## 2020-05-02 DIAGNOSIS — M25662 Stiffness of left knee, not elsewhere classified: Secondary | ICD-10-CM | POA: Diagnosis not present

## 2020-05-02 DIAGNOSIS — R531 Weakness: Secondary | ICD-10-CM | POA: Diagnosis not present

## 2020-05-02 DIAGNOSIS — M25561 Pain in right knee: Secondary | ICD-10-CM | POA: Diagnosis not present

## 2020-05-02 DIAGNOSIS — M25562 Pain in left knee: Secondary | ICD-10-CM | POA: Diagnosis not present

## 2020-05-02 DIAGNOSIS — Z4789 Encounter for other orthopedic aftercare: Secondary | ICD-10-CM | POA: Diagnosis not present

## 2020-05-10 DIAGNOSIS — Z23 Encounter for immunization: Secondary | ICD-10-CM | POA: Diagnosis not present

## 2020-05-14 ENCOUNTER — Ambulatory Visit (INDEPENDENT_AMBULATORY_CARE_PROVIDER_SITE_OTHER): Payer: Medicare Other | Admitting: *Deleted

## 2020-05-14 DIAGNOSIS — I480 Paroxysmal atrial fibrillation: Secondary | ICD-10-CM

## 2020-05-14 DIAGNOSIS — Z5181 Encounter for therapeutic drug level monitoring: Secondary | ICD-10-CM

## 2020-05-14 LAB — POCT INR: INR: 2.2 (ref 2.0–3.0)

## 2020-05-14 NOTE — Patient Instructions (Signed)
Continue warfarin 1 1/2 tablet daily  Recheck in 3 weeks

## 2020-05-23 DIAGNOSIS — L720 Epidermal cyst: Secondary | ICD-10-CM | POA: Diagnosis not present

## 2020-05-23 DIAGNOSIS — L814 Other melanin hyperpigmentation: Secondary | ICD-10-CM | POA: Diagnosis not present

## 2020-05-23 DIAGNOSIS — D2239 Melanocytic nevi of other parts of face: Secondary | ICD-10-CM | POA: Diagnosis not present

## 2020-05-23 DIAGNOSIS — L821 Other seborrheic keratosis: Secondary | ICD-10-CM | POA: Diagnosis not present

## 2020-05-23 DIAGNOSIS — D225 Melanocytic nevi of trunk: Secondary | ICD-10-CM | POA: Diagnosis not present

## 2020-05-28 ENCOUNTER — Telehealth: Payer: Self-pay | Admitting: Cardiology

## 2020-05-28 MED ORDER — WARFARIN SODIUM 5 MG PO TABS: ORAL_TABLET | ORAL | 3 refills | Status: DC

## 2020-05-28 NOTE — Telephone Encounter (Signed)
    1. Which medications need to be refilled? (please list name of each medication and dose if know  WARFARIN  Changed to 7.5 patient is running out of medication   2. Which pharmacy/location (including street and city if local pharmacy) is medication to be sent to?  [facility], [place]   3. Do they need a 30 day or 90 day supply?  [place]

## 2020-06-04 ENCOUNTER — Ambulatory Visit (INDEPENDENT_AMBULATORY_CARE_PROVIDER_SITE_OTHER): Payer: Medicare Other | Admitting: *Deleted

## 2020-06-04 DIAGNOSIS — I480 Paroxysmal atrial fibrillation: Secondary | ICD-10-CM

## 2020-06-04 DIAGNOSIS — I4891 Unspecified atrial fibrillation: Secondary | ICD-10-CM | POA: Diagnosis not present

## 2020-06-04 DIAGNOSIS — Z5181 Encounter for therapeutic drug level monitoring: Secondary | ICD-10-CM | POA: Diagnosis not present

## 2020-06-04 LAB — POCT INR: INR: 3.5 — AB (ref 2.0–3.0)

## 2020-06-04 NOTE — Patient Instructions (Signed)
Hold warfarin tonight then decrease dose to 1 1/2 tablets daily except 1 tablet on Tuesdays and Fridays  Recheck in 3 weeks

## 2020-06-18 DIAGNOSIS — M1991 Primary osteoarthritis, unspecified site: Secondary | ICD-10-CM | POA: Diagnosis not present

## 2020-06-18 DIAGNOSIS — E039 Hypothyroidism, unspecified: Secondary | ICD-10-CM | POA: Diagnosis not present

## 2020-06-18 DIAGNOSIS — I10 Essential (primary) hypertension: Secondary | ICD-10-CM | POA: Diagnosis not present

## 2020-06-24 DIAGNOSIS — Z1331 Encounter for screening for depression: Secondary | ICD-10-CM | POA: Diagnosis not present

## 2020-06-24 DIAGNOSIS — Z6834 Body mass index (BMI) 34.0-34.9, adult: Secondary | ICD-10-CM | POA: Diagnosis not present

## 2020-06-24 DIAGNOSIS — R7309 Other abnormal glucose: Secondary | ICD-10-CM | POA: Diagnosis not present

## 2020-06-24 DIAGNOSIS — M1991 Primary osteoarthritis, unspecified site: Secondary | ICD-10-CM | POA: Diagnosis not present

## 2020-06-24 DIAGNOSIS — R0683 Snoring: Secondary | ICD-10-CM | POA: Diagnosis not present

## 2020-06-24 DIAGNOSIS — E559 Vitamin D deficiency, unspecified: Secondary | ICD-10-CM | POA: Diagnosis not present

## 2020-06-24 DIAGNOSIS — R5383 Other fatigue: Secondary | ICD-10-CM | POA: Diagnosis not present

## 2020-06-24 DIAGNOSIS — E6609 Other obesity due to excess calories: Secondary | ICD-10-CM | POA: Diagnosis not present

## 2020-06-25 ENCOUNTER — Ambulatory Visit (INDEPENDENT_AMBULATORY_CARE_PROVIDER_SITE_OTHER): Payer: Medicare Other | Admitting: *Deleted

## 2020-06-25 DIAGNOSIS — I480 Paroxysmal atrial fibrillation: Secondary | ICD-10-CM

## 2020-06-25 DIAGNOSIS — Z5181 Encounter for therapeutic drug level monitoring: Secondary | ICD-10-CM

## 2020-06-25 LAB — POCT INR: INR: 1.6 — AB (ref 2.0–3.0)

## 2020-06-25 NOTE — Patient Instructions (Signed)
Increase warfarin to 1 1/2 tablets daily Recheck in 2 weeks

## 2020-06-27 DIAGNOSIS — E119 Type 2 diabetes mellitus without complications: Secondary | ICD-10-CM | POA: Diagnosis not present

## 2020-07-11 DIAGNOSIS — Z96653 Presence of artificial knee joint, bilateral: Secondary | ICD-10-CM | POA: Diagnosis not present

## 2020-07-12 ENCOUNTER — Other Ambulatory Visit: Payer: Self-pay

## 2020-07-12 ENCOUNTER — Ambulatory Visit
Admission: RE | Admit: 2020-07-12 | Discharge: 2020-07-12 | Disposition: A | Discharge status: Home / Self Care | Payer: Medicare Other | Source: Ambulatory Visit | Attending: Emergency Medicine | Admitting: Emergency Medicine

## 2020-07-12 VITALS — BP 151/97 | HR 60 | Temp 97.3°F | Resp 18

## 2020-07-12 DIAGNOSIS — R109 Unspecified abdominal pain: Secondary | ICD-10-CM

## 2020-07-12 LAB — POCT URINALYSIS DIP (MANUAL ENTRY)
Bilirubin, UA: NEGATIVE
Blood, UA: NEGATIVE
Glucose, UA: NEGATIVE mg/dL
Ketones, POC UA: NEGATIVE mg/dL
Leukocytes, UA: NEGATIVE
Nitrite, UA: NEGATIVE
Protein Ur, POC: NEGATIVE mg/dL
Spec Grav, UA: 1.015 (ref 1.010–1.025)
Urobilinogen, UA: 0.2 E.U./dL
pH, UA: 7 (ref 5.0–8.0)

## 2020-07-12 MED ORDER — AMOXICILLIN-POT CLAVULANATE 875-125 MG PO TABS: 1.0000 | ORAL_TABLET | Freq: Two times a day (BID) | ORAL | 0 refills | Status: AC

## 2020-07-12 MED ORDER — DICYCLOMINE HCL 20 MG PO TABS: 20.0000 mg | ORAL_TABLET | Freq: Two times a day (BID) | ORAL | 0 refills | Status: DC

## 2020-07-12 MED ORDER — ONDANSETRON HCL 4 MG PO TABS: 4.0000 mg | ORAL_TABLET | Freq: Four times a day (QID) | ORAL | 0 refills | Status: DC

## 2020-07-12 NOTE — ED Triage Notes (Addendum)
All over abd soreness and gas since Monday after eating some shrimp.  Pt has hx of diverticulitis and ibs.   Having frequent bowel movements. Does report some urinary frequency.

## 2020-07-12 NOTE — Discharge Instructions (Addendum)
Get rest and drink fluids Zofran prescribed for nausea.  Take as directed.   Given history of diverticulitis will cover for that Augmentin prescribed Bentyl for cramping Follow up with PCP for recheck this week to ensure symptoms are improving If you experience new or worsening symptoms return or go to ER such as fever, chills, nausea, vomiting, diarrhea, bloody or dark tarry stools, constipation, urinary symptoms, worsening abdominal discomfort, symptoms that do not improve with medications, inability to keep fluids down, etc..Marland Kitchen

## 2020-07-12 NOTE — ED Provider Notes (Signed)
[place]   [id number] 07/12/20 Arrival Time: 2585  CC: ABDOMINAL DISCOMFORT  SUBJECTIVE:  JANICA ELDRED is a 70 y.o. female who presents with complaint of abdominal discomfort that began 3 days ago.  Denies a precipitating event, trauma, close contacts with similar symptoms, recent travel or antibiotic use.  Symptoms began after eating shrimp.  Pain is generalized.  Describes as intermittent and achy in character.  Has tried metamucil without relief Worse with eating.  Reports hx of diverticulitis in the past, but patient also has IBS. Last BM this morning with loose stools.  5-6 episodes of loose stools/ diarrhea per day. Complains of associated nausea, urinary frequency.    Denies fever, chills, vomiting, chest pain, SOB, diarrhea, constipation, hematochezia, melena, dysuria, difficulty urinating, increased frequency or urgency, flank pain, loss of bowel or bladder function.   No LMP recorded. Patient has had a hysterectomy.  ROS: As per HPI.  All other pertinent ROS negative.     Past Medical History:  Diagnosis Date   Allergic rhinitis    Anal fissure    Hx of    Anxiety    hx of   Arthritis    Asthma    none in last year    Chronic kidney disease    partial nephrectomy   Depression    hx of   Diabetes mellitus without complication ([place])    no meds   Diverticular disease    Dysrhythmia    palpitations   Eczema    Endometrial cancer ([place]) 2001   spread to appendix    Hemorrhoids    Hyperglycemia    Hypertension    Hypothyroidism    Kidney cancer, primary, with metastasis from kidney to other site [facility])    Obesity    Sebaceous cyst    Thyroid disease    Thyroid nodule    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   Past Surgical History:  Procedure Laterality Date   APPENDECTOMY  2008   BIOPSY  12/14/2016   Procedure: BIOPSY;  Surgeon: Rogene Houston, MD;  Location: [facility];  Service: Endoscopy;;  colon   CHOLECYSTECTOMY  2008    COLONOSCOPY N/A 11/24/2012   Procedure: COLONOSCOPY;  Surgeon: Rogene Houston, MD;  Location: [facility];  Service: Endoscopy;  Laterality: N/A;  830   COLONOSCOPY N/A 12/14/2016   Procedure: COLONOSCOPY;  Surgeon: Rogene Houston, MD;  Location: [facility];  Service: Endoscopy;  Laterality: N/A;  12:00   POLYPECTOMY  12/14/2016   Procedure: POLYPECTOMY;  Surgeon: Rogene Houston, MD;  Location: [facility];  Service: Endoscopy;;  colon   ROBOTIC ASSITED PARTIAL NEPHRECTOMY Left 08/21/2016   Procedure: XI ROBOTIC ASSITED PARTIAL NEPHRECTOMY;  Surgeon: Ardis Hughs, MD;  Location: [facility];  Service: Urology;  Laterality: Left;   TOTAL ABDOMINAL HYSTERECTOMY  2001   TOTAL KNEE ARTHROPLASTY Bilateral 01/24/2020   Procedure: TOTAL KNEE BILATERAL;  Surgeon: Gaynelle Arabian, MD;  Location: [facility];  Service: Orthopedics;  Laterality: Bilateral;   WISDOM TOOTH EXTRACTION     Allergies  Allergen Reactions   Triple Antibiotic [Bacitracin-Neomycin-Polymyxin] Anaphylaxis   Levofloxacin Other (See Comments)    Stomach upset   Oxycodone     Tremors and brain fog   Capsaicin Rash   Elastic Bandages & [Zinc] Rash   Nickel Itching and Rash   No current facility-administered medications on file prior to encounter.   Current Outpatient Medications on File Prior to Encounter  Medication Sig Dispense Refill   acetaminophen (TYLENOL) 500 MG tablet Take 500 mg by mouth every 6 (six) hours as needed.     albuterol (PROVENTIL HFA;VENTOLIN HFA) 108 (90 Base) MCG/ACT inhaler Inhale 2 puffs into the lungs every 6 (six) hours as needed for wheezing or shortness of breath.     cetirizine (ZYRTEC) 10 MG tablet Take 10 mg by mouth daily as needed for allergies.     diclofenac Sodium (VOLTAREN) 1 % GEL Apply 4 g topically 4 (four) times daily. 350 g 0   fluticasone (FLONASE) 50 MCG/ACT nasal spray Place 1 spray into both nostrils 2 (two) times daily as needed for allergies or rhinitis. 16 g 5    levothyroxine (SYNTHROID, LEVOTHROID) 75 MCG tablet Take 75 mcg by mouth daily before breakfast.     metoprolol tartrate (LOPRESSOR) 25 MG tablet Take 0.5 tablets (12.5 mg total) by mouth as needed (for palpitations). 45 tablet 3   sertraline (ZOLOFT) 100 MG tablet Take 150 mg by mouth daily with breakfast.     triamcinolone cream (KENALOG) 0.1 % Apply 1 application topically daily as needed (rash).     warfarin (COUMADIN) 5 MG tablet Take 1 1/2 tablets daily or as directed 150 tablet 3   Social History   Socioeconomic History   Marital status: Single    Spouse name: Not on file   Number of children: 0   Years of education: Not on file   Highest education level: Not on file  Occupational History   Occupation: Psychologist  Tobacco Use   Smoking status: Former    Packs/day: 1.00    Years: 20.00    Pack years: 20.00    Types: Cigarettes    Quit date: 01/19/1998    Years since quitting: 22.4   Smokeless tobacco: Never  Vaping Use   Vaping Use: Never used  Substance and Sexual Activity   Alcohol use: Yes    Alcohol/week: 0.0 standard drinks    Comment: rare   Drug use: No   Sexual activity: Not Currently  Other Topics Concern   Not on file  Social History Narrative   2 cups of caffeine daily    Social Determinants of Health   Financial Resource Strain: Not on file  Food Insecurity: Not on file  Transportation Needs: Not on file  Physical Activity: Not on file  Stress: Not on file  Social Connections: Not on file  Intimate Partner Violence: Not on file   Family History  Problem Relation Age of Onset   Breast cancer Mother    Diabetes Mother    Obesity Sister    Asthma Sister    Eczema Sister    COPD Father        smoked   Prostate cancer Father    Breast cancer Maternal Aunt    Urticaria Maternal Grandfather    Colon cancer Neg Hx      OBJECTIVE:  Vitals:   07/12/20 1315 07/12/20 1318  BP:  (!) 151/97  Pulse: 60 60  Resp: 19 18  Temp: (!) 97.3 F (36.3  C)   TempSrc: Oral   SpO2: 96% 96%    General appearance: Alert; NAD HEENT: NCAT.  Oropharynx clear.  Lungs: clear to auscultation bilaterally without adventitious breath sounds Heart: regular rate and rhythm.  Radial pulses 2+ symmetrical bilaterally Abdomen: soft, non-distended; normal active bowel sounds; non-tender to light and deep palpation; nontender at McBurney's point; negative Murphy's sign; negative rebound; no guarding Back: no  CVA tenderness Extremities: no edema; symmetrical with no gross deformities Skin: warm and dry Neurologic: normal gait Psychological: alert and cooperative; normal mood and affect  LABS: Results for orders placed or performed during the hospital encounter of 07/12/20 (from the past 24 hour(s))  POCT urinalysis dipstick     Status: None   Collection Time: 07/12/20  1:23 PM  Result Value Ref Range   Color, UA yellow yellow   Clarity, UA clear clear   Glucose, UA negative negative mg/dL   Bilirubin, UA negative negative   Ketones, POC UA negative negative mg/dL   Spec Grav, UA 1.015 1.010 - 1.025   Blood, UA negative negative   pH, UA 7.0 5.0 - 8.0   Protein Ur, POC negative negative mg/dL   Urobilinogen, UA 0.2 0.2 or 1.0 E.U./dL   Nitrite, UA Negative Negative   Leukocytes, UA Negative Negative    ASSESSMENT & PLAN:  1. Abdominal discomfort     Meds ordered this encounter  Medications   amoxicillin-clavulanate (AUGMENTIN) 875-125 MG tablet    Sig: Take 1 tablet by mouth every 12 (twelve) hours for 10 days.    Dispense:  20 tablet    Refill:  0    Order Specific Question:   Supervising Provider    Answer:   Raylene Everts [[id number]]   dicyclomine (BENTYL) 20 MG tablet    Sig: Take 1 tablet (20 mg total) by mouth 2 (two) times daily.    Dispense:  10 tablet    Refill:  0    Order Specific Question:   Supervising Provider    Answer:   Raylene Everts [[id number]]   ondansetron (ZOFRAN) 4 MG tablet    Sig: Take 1 tablet (4 mg  total) by mouth every 6 (six) hours.    Dispense:  12 tablet    Refill:  0    Order Specific Question:   Supervising Provider    Answer:   Raylene Everts [[id number]]   Get rest and drink fluids Zofran prescribed for nausea.  Take as directed.   Given history of diverticulitis will cover for that Augmentin prescribed Bentyl for cramping Follow up with PCP for recheck this week to ensure symptoms are improving If you experience new or worsening symptoms return or go to ER such as fever, chills, nausea, vomiting, diarrhea, bloody or dark tarry stools, constipation, urinary symptoms, worsening abdominal discomfort, symptoms that do not improve with medications, inability to keep fluids down, etc...  Reviewed expectations re: course of current medical issues. Questions answered. Outlined signs and symptoms indicating need for more acute intervention. Patient verbalized understanding. After Visit Summary given.   Lestine Box, PA-C 07/12/20 1343

## 2020-07-15 ENCOUNTER — Other Ambulatory Visit: Payer: Self-pay

## 2020-07-15 ENCOUNTER — Ambulatory Visit (INDEPENDENT_AMBULATORY_CARE_PROVIDER_SITE_OTHER): Payer: Medicare Other | Admitting: *Deleted

## 2020-07-15 DIAGNOSIS — I480 Paroxysmal atrial fibrillation: Secondary | ICD-10-CM

## 2020-07-15 DIAGNOSIS — Z5181 Encounter for therapeutic drug level monitoring: Secondary | ICD-10-CM

## 2020-07-15 LAB — POCT INR: INR: 1.6 — AB (ref 2.0–3.0)

## 2020-07-15 NOTE — Patient Instructions (Signed)
Take 2 tablets tonight then increase dose to 1 1/2 tablets daily except 2 tablet on Tuesdays, Thursdays and Saturdays Recheck in 1 weeks

## 2020-07-19 ENCOUNTER — Other Ambulatory Visit: Payer: Self-pay

## 2020-07-19 ENCOUNTER — Ambulatory Visit (HOSPITAL_COMMUNITY): Payer: Medicare Other | Attending: Internal Medicine | Admitting: Physical Therapy

## 2020-07-19 ENCOUNTER — Encounter (HOSPITAL_COMMUNITY): Payer: Self-pay | Admitting: Physical Therapy

## 2020-07-19 DIAGNOSIS — G8929 Other chronic pain: Secondary | ICD-10-CM | POA: Diagnosis not present

## 2020-07-19 DIAGNOSIS — M25561 Pain in right knee: Secondary | ICD-10-CM | POA: Insufficient documentation

## 2020-07-19 DIAGNOSIS — M6281 Muscle weakness (generalized): Secondary | ICD-10-CM | POA: Insufficient documentation

## 2020-07-19 DIAGNOSIS — M25562 Pain in left knee: Secondary | ICD-10-CM | POA: Diagnosis not present

## 2020-07-19 DIAGNOSIS — R262 Difficulty in walking, not elsewhere classified: Secondary | ICD-10-CM | POA: Insufficient documentation

## 2020-07-19 NOTE — Therapy (Signed)
[place], [place], [address] Phone: [phone]   Fax:  [phone]  Physical Therapy Evaluation  Patient Details  Name: Melissa Velazquez MRN: [id number] Date of Birth: [birth year] Referring Provider (PT): lawrence fusco, MD   Encounter Date: 07/19/2020   PT End of Session - 07/19/20 0917     Visit Number 1    Number of Visits 6    Date for PT Re-Evaluation 09/27/20    Authorization Type medicare primary adn [employer] secondary - no Auth or VL    Progress Note Due on Visit 6    PT Start Time 0917    PT Stop Time 0950    PT Time Calculation (min) 33 min             Past Medical History:  Diagnosis Date   Allergic rhinitis    Anal fissure    Hx of    Anxiety    hx of   Arthritis    Asthma    none in last year    Chronic kidney disease    partial nephrectomy   Depression    hx of   Diabetes mellitus without complication ([place])    no meds   Diverticular disease    Dysrhythmia    palpitations   Eczema    Endometrial cancer ([place]) 2001   spread to appendix    Hemorrhoids    Hyperglycemia    Hypertension    Hypothyroidism    Kidney cancer, primary, with metastasis from kidney to other site [facility])    Obesity    Sebaceous cyst    Thyroid disease    Thyroid nodule    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    Past Surgical History:  Procedure Laterality Date   APPENDECTOMY  2008   BIOPSY  12/14/2016   Procedure: BIOPSY;  Surgeon: Rogene Houston, MD;  [facility];  Service: Endoscopy;;  colon   CHOLECYSTECTOMY  2008   COLONOSCOPY N/A 11/24/2012   Procedure: COLONOSCOPY;  Surgeon: Rogene Houston, MD;  [facility];  Service: Endoscopy;  Laterality: N/A;  830   COLONOSCOPY N/A 12/14/2016   Procedure: COLONOSCOPY;  Surgeon: Rogene Houston, MD;  [facility];  Service: Endoscopy;  Laterality: N/A;  12:00   POLYPECTOMY  12/14/2016   Procedure: POLYPECTOMY;   Surgeon: Rogene Houston, MD;  [facility];  Service: Endoscopy;;  colon   ROBOTIC ASSITED PARTIAL NEPHRECTOMY Left 08/21/2016   Procedure: XI ROBOTIC ASSITED PARTIAL NEPHRECTOMY;  Surgeon: Ardis Hughs, MD;  [facility];  Service: Urology;  Laterality: Left;   TOTAL ABDOMINAL HYSTERECTOMY  2001   TOTAL KNEE ARTHROPLASTY Bilateral 01/24/2020   Procedure: TOTAL KNEE BILATERAL;  Surgeon: Gaynelle Arabian, MD;  [facility];  Service: Orthopedics;  Laterality: Bilateral;   WISDOM TOOTH EXTRACTION      There were no vitals filed for this visit.    Subjective Assessment - 07/19/20 0924     Subjective States that her biggest challenge is going up and down stairs with alternating her steps. States that she had both her knees replaced on 01/24/20. Reports she has pain getting up in her back and knees with pain reported to be about 5/10 in both knees. States she has a stationary bike and she rides it but in the last month she hasn't ridden it as much. States she walks daily.    Pertinent History  afib, bilateral knee replacement    Limitations Walking;Standing;House hold activities    Patient Stated Goals wants exercises to get up/down and to have motivation for her exercises    Currently in Pain? Yes    Pain Score 5     Pain Location Knee    Pain Orientation Right;Left    Pain Descriptors / Indicators Aching;Tightness    Pain Type Chronic pain    Pain Frequency Intermittent    Aggravating Factors  with movmenet    Pain Relieving Factors rest                [facility] PT Assessment - 07/19/20 0001       Assessment   Medical Diagnosis hx of bilateral knee replacement    Referring Provider (PT) lawrence fusco, MD    Onset Date/Surgical Date 01/24/20    Prior Therapy yes for her knees      Balance Screen   Has the patient fallen in the past 6 months No      Prior Function   Level of Independence Independent      ROM / Strength   AROM / PROM / Strength  AROM;Strength      AROM   AROM Assessment Site Knee    Right/Left Knee Left;Right    Right Knee Extension 12   lacking   Right Knee Flexion 110    Left Knee Extension 15    Left Knee Flexion 110   painful along outside of knee.     Transfers   Five time sit to stand comments  18.35   shifts to the right no UE assist     Ambulation/Gait   Ambulation/Gait Yes    Ambulation Distance (Feet) 345 Feet    Assistive device None    Gait Pattern Decreased stride length;Decreased hip/knee flexion - right;Decreased hip/knee flexion - left;Right circumduction;Antalgic;Trunk flexed    Gait velocity decreased    Gait Comments 2MW                        Objective measurements completed on examination: See above findings.       [facility] Adult PT Treatment/Exercise - 07/19/20 0001       Exercises   Exercises Knee/Hip      Knee/Hip Exercises: Supine   Heel Slides 10 reps;Both   5" holds                   PT Education - 07/19/20 0947     Education Details on importance of adherence to HEP, FOTO score, POC    Person(s) Educated Patient    Methods Explanation    Comprehension Verbalized understanding              PT Short Term Goals - 07/19/20 0954       PT SHORT TERM GOAL #1   Title Patient will be able to ambulate 2 minutes without any circumduction of right leg or left knee "acting funny" per patient.    Time 5    Period Weeks    Status New    Target Date 08/23/20      PT SHORT TERM GOAL #2   Title Patient will demonstrate at least 8-115 degrees of knee ROM bilaterally    Time 5    Period Weeks    Status New    Target Date 08/23/20      PT SHORT TERM GOAL #3   Title Patient will be independent  in self management strategies to improve quality of life and functional outcomes.    Time 5    Period Weeks    Status New    Target Date 08/23/20               PT Long Term Goals - 07/19/20 0955       PT LONG TERM GOAL #1   Title Patient  will be able to go up and down steps with reciprocal gait pattern with railing as needed to improve ability to climb up and down steps    Time 10    Period Weeks    Status New    Target Date 09/27/20      PT LONG TERM GOAL #2   Title Patient will improve on FOTO score to meet predicted outcomes to demonstrate improved functional mobility.    Time 10    Period Weeks    Status New    Target Date 09/27/20      PT LONG TERM GOAL #3   Title Patient will report at least 50% improvement in overall symptoms and/or function to demonstrate improved functional mobility    Time 10    Period Weeks    Status New    Target Date 09/27/20                    Plan - 07/19/20 0958     Clinical Impression Statement Patient is a 70 y.o. female who presents to physical therapy with complaint of bilateral knee pain. Patient demonstrates decreased strength, ROM restriction, balance deficits and gait abnormalities which are likely contributing to symptoms of pain and are negatively impacting patient ability to perform ADLs and functional mobility tasks. Patient had a bilateral TKA on 01/24/20 and over the last month she has not been adherent to her home exercise program. Patient will benefit from skilled physical therapy services to address these deficits to reduce pain, improve level of function with ADLs, functional mobility tasks, and reduce risk for falls.    Personal Factors and Comorbidities Comorbidity 1;Comorbidity 2    Comorbidities history of multiple cancers, bilateral TKA 01/24/20    Examination-Activity Limitations Squat;Stairs;Stand;Transfers;Locomotion Level;Lift;Carry;Bend;Bed Mobility    Examination-Participation Restrictions Meal Prep;Laundry;Community Activity;Cleaning    Stability/Clinical Decision Making Stable/Uncomplicated    Clinical Decision Making Low    Rehab Potential Fair    PT Frequency Other (comment)   total of 6 visits over 10 week certification with max of 1 visit/week    PT Duration Other (comment)   10 weeks   PT Treatment/Interventions ADLs/Self Care Home Management;Cryotherapy;Therapeutic exercise;Therapeutic activities;Manual techniques;Gait training;Neuromuscular re-education;Patient/family education;Dry needling    PT Next Visit Plan focus on developing strong HEP and work on adherence strategies to HEP, knee ROM and functional strengthening of hips and knees    PT Home Exercise Plan heels slides with stretching in both extension and flexion             Patient will benefit from skilled therapeutic intervention in order to improve the following deficits and impairments:  Pain, Abnormal gait, Increased edema, Decreased activity tolerance, Decreased mobility, Decreased balance, Decreased range of motion, Difficulty walking  Visit Diagnosis: Muscle weakness (generalized)  Difficulty in walking, not elsewhere classified  Chronic pain of left knee  Chronic pain of right knee     Problem List Patient Active Problem List   Diagnosis Date Noted   Atrial fibrillation ([place]) 04/01/2020   Encounter for therapeutic drug monitoring 04/01/2020   Acute  blood loss anemia 02/09/2020   Atrial fibrillation with rapid ventricular response ([place]) 01/29/2020   Hypoxia 01/29/2020   Hypothyroidism 01/29/2020   OA (osteoarthritis) of knee 01/24/2020   Bilateral primary osteoarthritis of knee 01/24/2020   Seasonal and perennial allergic rhinitis 12/24/2019   Allergic contact dermatitis 12/24/2019   Mild intermittent asthma, uncomplicated 50/09/3816   IBS (irritable bowel syndrome) 04/11/2019   History of colonic polyps 11/25/2016   Renal mass 08/21/2016   Upper airway cough syndrome 07/06/2014   Essential hypertension 07/06/2014   Personal history of colonic polyps 09/17/2010   BACK PAIN 09/14/2008   HIRSUTISM 06/15/2008   GLUCOSE INTOLERANCE 01/09/2008   NEOPLASM, MALIGNANT, UTERUS 12/27/2006   THYROID NODULE 12/27/2006   DIVERTICULAR DISEASE  06/03/2006   OBESITY NOS 03/09/2006   DEPRESSION 03/09/2006   ALLERGIC RHINITIS 03/09/2006   ASTHMA 03/09/2006   GERD 03/09/2006   ELEVATED BLOOD PRESSURE WITHOUT DIAGNOSIS OF HYPERTENSION 03/09/2006    Eliezer Champagne 07/19/2020, 10:01 AM  [place], [place], [address] Phone: [phone]   Fax:  [phone]  Name: Melissa Velazquez MRN: [id number] Date of Birth: 06-08-1950

## 2020-07-26 ENCOUNTER — Ambulatory Visit (INDEPENDENT_AMBULATORY_CARE_PROVIDER_SITE_OTHER): Payer: Medicare Other | Admitting: *Deleted

## 2020-07-26 ENCOUNTER — Other Ambulatory Visit: Payer: Self-pay

## 2020-07-26 ENCOUNTER — Ambulatory Visit (HOSPITAL_COMMUNITY): Payer: Medicare Other | Admitting: Physical Therapy

## 2020-07-26 ENCOUNTER — Encounter (HOSPITAL_COMMUNITY): Payer: Self-pay | Admitting: Physical Therapy

## 2020-07-26 DIAGNOSIS — I480 Paroxysmal atrial fibrillation: Secondary | ICD-10-CM

## 2020-07-26 DIAGNOSIS — R262 Difficulty in walking, not elsewhere classified: Secondary | ICD-10-CM | POA: Diagnosis not present

## 2020-07-26 DIAGNOSIS — Z5181 Encounter for therapeutic drug level monitoring: Secondary | ICD-10-CM | POA: Diagnosis not present

## 2020-07-26 DIAGNOSIS — M6281 Muscle weakness (generalized): Secondary | ICD-10-CM

## 2020-07-26 DIAGNOSIS — G8929 Other chronic pain: Secondary | ICD-10-CM

## 2020-07-26 DIAGNOSIS — M25561 Pain in right knee: Secondary | ICD-10-CM

## 2020-07-26 DIAGNOSIS — M25562 Pain in left knee: Secondary | ICD-10-CM

## 2020-07-26 LAB — POCT INR: INR: 2.2 (ref 2.0–3.0)

## 2020-07-26 NOTE — Therapy (Signed)
[place], [place], [address] Phone: [phone]   Fax:  [phone]  Physical Therapy Treatment  Patient Details  Name: VALDA CHRISTENSON MRN: [id number] Date of Birth: [birth year] Referring Provider (PT): lawrence fusco, MD   Encounter Date: 07/26/2020   PT End of Session - 07/26/20 1529     Visit Number 2    Number of Visits 6    Date for PT Re-Evaluation 09/27/20    Authorization Type medicare primary adn [employer] secondary - no Auth or VL    Progress Note Due on Visit 6    PT Start Time 1530    PT Stop Time 1610    PT Time Calculation (min) 40 min             Past Medical History:  Diagnosis Date   Allergic rhinitis    Anal fissure    Hx of    Anxiety    hx of   Arthritis    Asthma    none in last year    Chronic kidney disease    partial nephrectomy   Depression    hx of   Diabetes mellitus without complication ([place])    no meds   Diverticular disease    Dysrhythmia    palpitations   Eczema    Endometrial cancer ([place]) 2001   spread to appendix    Hemorrhoids    Hyperglycemia    Hypertension    Hypothyroidism    Kidney cancer, primary, with metastasis from kidney to other site 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    Past Surgical History:  Procedure Laterality Date   APPENDECTOMY  2008   BIOPSY  12/14/2016   Procedure: BIOPSY;  Surgeon: Rogene Houston, MD;  [facility];  Service: Endoscopy;;  colon   CHOLECYSTECTOMY  2008   COLONOSCOPY N/A 11/24/2012   Procedure: COLONOSCOPY;  Surgeon: Rogene Houston, MD;  [facility];  Service: Endoscopy;  Laterality: N/A;  830   COLONOSCOPY N/A 12/14/2016   Procedure: COLONOSCOPY;  Surgeon: Rogene Houston, MD;  [facility];  Service: Endoscopy;  Laterality: N/A;  12:00   POLYPECTOMY  12/14/2016   Procedure: POLYPECTOMY;   Surgeon: Rogene Houston, MD;  [facility];  Service: Endoscopy;;  colon   ROBOTIC ASSITED PARTIAL NEPHRECTOMY Left 08/21/2016   Procedure: XI ROBOTIC ASSITED PARTIAL NEPHRECTOMY;  Surgeon: Ardis Hughs, MD;  [facility];  Service: Urology;  Laterality: Left;   TOTAL ABDOMINAL HYSTERECTOMY  2001   TOTAL KNEE ARTHROPLASTY Bilateral 01/24/2020   Procedure: TOTAL KNEE BILATERAL;  Surgeon: Gaynelle Arabian, MD;  [facility];  Service: Orthopedics;  Laterality: Bilateral;   WISDOM TOOTH EXTRACTION      There were no vitals filed for this visit.   Subjective Assessment - 07/26/20 1535     Subjective Reports no real pain. She has been doing some exercises. States it's hard to motivate herself sometimes.    Pertinent History afib, bilateral knee replacement    Limitations Walking;Standing;House hold activities    Patient Stated Goals wants exercises to get up/down and to have motivation for her exercises    Currently in Pain? No/denies                [facility] PT Assessment -  07/26/20 0001       Assessment   Medical Diagnosis hx of bilateral knee replacement    Referring Provider (PT) lawrence fusco, MD    Onset Date/Surgical Date 01/24/20                           [facility] Adult PT Treatment/Exercise - 07/26/20 0001       Knee/Hip Exercises: Aerobic   Stationary Bike level 3 - 5 minutes      Knee/Hip Exercises: Standing   Step Down Step Height: 4";Hand Hold: 1;Both;20 reps   verbal and tactile cues     Knee/Hip Exercises: Seated   Other Seated Knee/Hip Exercises patella mobilization both x20 5" holds      Knee/Hip Exercises: Prone   Hamstring Curl 20 reps;5 seconds   both   Prone Knee Hang --   10 seconds holds x20 both                   PT Education - 07/26/20 1614     Education Details on importance of performing exercises daily, on anatomy and rationale for exercises.    Person(s) Educated Patient    Methods Explanation     Comprehension Verbalized understanding              PT Short Term Goals - 07/19/20 0954       PT SHORT TERM GOAL #1   Title Patient will be able to ambulate 2 minutes without any circumduction of right leg or left knee "acting funny" per patient.    Time 5    Period Weeks    Status New    Target Date 08/23/20      PT SHORT TERM GOAL #2   Title Patient will demonstrate at least 8-115 degrees of knee ROM bilaterally    Time 5    Period Weeks    Status New    Target Date 08/23/20      PT SHORT TERM GOAL #3   Title Patient will be independent in self management strategies to improve quality of life and functional outcomes.    Time 5    Period Weeks    Status New    Target Date 08/23/20               PT Long Term Goals - 07/19/20 0955       PT LONG TERM GOAL #1   Title Patient will be able to go up and down steps with reciprocal gait pattern with railing as needed to improve ability to climb up and down steps    Time 10    Period Weeks    Status New    Target Date 09/27/20      PT LONG TERM GOAL #2   Title Patient will improve on FOTO score to meet predicted outcomes to demonstrate improved functional mobility.    Time 10    Period Weeks    Status New    Target Date 09/27/20      PT LONG TERM GOAL #3   Title Patient will report at least 50% improvement in overall symptoms and/or function to demonstrate improved functional mobility    Time 10    Period Weeks    Status New    Target Date 09/27/20                   Plan - 07/26/20 1619     Clinical Impression  Statement Patient with continued frustration about not being able to be completely functional following recent bilateral TKA earlier this year. Explained how healing takes a whole year and that it is very important to continue to work on knee ROM and strength throughout this time period. Educated patient on importance of frequency of performing knee ROM exercises to improve knee motion and  strength. Eccentric weakness noted with steps and compensatory strategies utilized for 4 inch step. Able to improve mechanics with verbal and tactile cues and one hand assist. Will continue with current POC as tolerated. Patient required reassurance throughout session.    Personal Factors and Comorbidities Comorbidity 1;Comorbidity 2    Comorbidities history of multiple cancers, bilateral TKA 01/24/20    Examination-Activity Limitations Squat;Stairs;Stand;Transfers;Locomotion Level;Lift;Carry;Bend;Bed Mobility    Examination-Participation Restrictions Meal Prep;Laundry;Community Activity;Cleaning    Stability/Clinical Decision Making Stable/Uncomplicated    Rehab Potential Fair    PT Frequency Other (comment)   total of 6 visits over 10 week certification with max of 1 visit/week   PT Duration Other (comment)   10 weeks   PT Treatment/Interventions ADLs/Self Care Home Management;Cryotherapy;Therapeutic exercise;Therapeutic activities;Manual techniques;Gait training;Neuromuscular re-education;Patient/family education;Dry needling    PT Next Visit Plan focus on developing strong HEP and work on adherence strategies to HEP, knee ROM and functional strengthening of hips and knees    PT Home Exercise Plan heels slides with stretching in both extension and flexion; patella mob, prone knee hang and prone knee flexion stretch, step down             Patient will benefit from skilled therapeutic intervention in order to improve the following deficits and impairments:  Pain, Abnormal gait, Increased edema, Decreased activity tolerance, Decreased mobility, Decreased balance, Decreased range of motion, Difficulty walking  Visit Diagnosis: Muscle weakness (generalized)  Difficulty in walking, not elsewhere classified  Chronic pain of left knee  Chronic pain of right knee     Problem List Patient Active Problem List   Diagnosis Date Noted   Atrial fibrillation ([place]) 04/01/2020   Encounter for  therapeutic drug monitoring 04/01/2020   Acute blood loss anemia 02/09/2020   Atrial fibrillation with rapid ventricular response ([place]) 01/29/2020   Hypoxia 01/29/2020   Hypothyroidism 01/29/2020   OA (osteoarthritis) of knee 01/24/2020   Bilateral primary osteoarthritis of knee 01/24/2020   Seasonal and perennial allergic rhinitis 12/24/2019   Allergic contact dermatitis 12/24/2019   Mild intermittent asthma, uncomplicated 23/36/1224   IBS (irritable bowel syndrome) 04/11/2019   History of colonic polyps 11/25/2016   Renal mass 08/21/2016   Upper airway cough syndrome 07/06/2014   Essential hypertension 07/06/2014   Personal history of colonic polyps 09/17/2010   BACK PAIN 09/14/2008   HIRSUTISM 06/15/2008   GLUCOSE INTOLERANCE 01/09/2008   NEOPLASM, MALIGNANT, UTERUS 12/27/2006   THYROID NODULE 12/27/2006   DIVERTICULAR DISEASE 06/03/2006   OBESITY NOS 03/09/2006   DEPRESSION 03/09/2006   ALLERGIC RHINITIS 03/09/2006   ASTHMA 03/09/2006   GERD 03/09/2006   ELEVATED BLOOD PRESSURE WITHOUT DIAGNOSIS OF HYPERTENSION 03/09/2006   4:19 PM, 07/26/20 Jerene Pitch, DPT Physical Therapy with [facility]  [phone] office   [place] [address] [place], [place], [address] Phone: [phone]   Fax:  [phone]  Name: ELAIJAH MUNOZ MRN: [id number] Date of Birth: February 12, 1950

## 2020-07-26 NOTE — Patient Instructions (Signed)
Continue warfarin 1 1/2 tablets daily except 2 tablet on Tuesdays, Thursdays and Saturdays Recheck in 2 weeks

## 2020-07-29 DIAGNOSIS — Z1231 Encounter for screening mammogram for malignant neoplasm of breast: Secondary | ICD-10-CM | POA: Diagnosis not present

## 2020-08-09 ENCOUNTER — Ambulatory Visit (HOSPITAL_COMMUNITY): Payer: Medicare Other | Admitting: Physical Therapy

## 2020-08-09 ENCOUNTER — Other Ambulatory Visit: Payer: Self-pay

## 2020-08-09 ENCOUNTER — Encounter (HOSPITAL_COMMUNITY): Payer: Self-pay | Admitting: Physical Therapy

## 2020-08-09 DIAGNOSIS — M25562 Pain in left knee: Secondary | ICD-10-CM

## 2020-08-09 DIAGNOSIS — G8929 Other chronic pain: Secondary | ICD-10-CM

## 2020-08-09 DIAGNOSIS — M25561 Pain in right knee: Secondary | ICD-10-CM | POA: Diagnosis not present

## 2020-08-09 DIAGNOSIS — R262 Difficulty in walking, not elsewhere classified: Secondary | ICD-10-CM

## 2020-08-09 DIAGNOSIS — M6281 Muscle weakness (generalized): Secondary | ICD-10-CM

## 2020-08-09 NOTE — Therapy (Signed)
[place], [place], [address] Phone: [phone]   Fax:  [phone]  Physical Therapy Treatment  Patient Details  Name: Melissa Velazquez MRN: [id number] Date of Birth: 1950/05/06 Referring Provider (PT): lawrence fusco, MD   Encounter Date: 08/09/2020   PT End of Session - 08/09/20 1128     Visit Number 3    Number of Visits 6    Date for PT Re-Evaluation 09/27/20    Authorization Type medicare primary adn [employer] secondary - no Auth or VL    Progress Note Due on Visit 6    PT Start Time 1129    PT Stop Time 1207    PT Time Calculation (min) 38 min             Past Medical History:  Diagnosis Date   Allergic rhinitis    Anal fissure    Hx of    Anxiety    hx of   Arthritis    Asthma    none in last year    Chronic kidney disease    partial nephrectomy   Depression    hx of   Diabetes mellitus without complication ([place])    no meds   Diverticular disease    Dysrhythmia    palpitations   Eczema    Endometrial cancer ([place]) 2001   spread to appendix    Hemorrhoids    Hyperglycemia    Hypertension    Hypothyroidism    Kidney cancer, primary, with metastasis from kidney to other site 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    Past Surgical History:  Procedure Laterality Date   APPENDECTOMY  2008   BIOPSY  12/14/2016   Procedure: BIOPSY;  Surgeon: Rogene Houston, MD;  [facility];  Service: Endoscopy;;  colon   CHOLECYSTECTOMY  2008   COLONOSCOPY N/A 11/24/2012   Procedure: COLONOSCOPY;  Surgeon: Rogene Houston, MD;  [facility];  Service: Endoscopy;  Laterality: N/A;  830   COLONOSCOPY N/A 12/14/2016   Procedure: COLONOSCOPY;  Surgeon: Rogene Houston, MD;  [facility];  Service: Endoscopy;  Laterality: N/A;  12:00   POLYPECTOMY  12/14/2016   Procedure: POLYPECTOMY;   Surgeon: Rogene Houston, MD;  [facility];  Service: Endoscopy;;  colon   ROBOTIC ASSITED PARTIAL NEPHRECTOMY Left 08/21/2016   Procedure: XI ROBOTIC ASSITED PARTIAL NEPHRECTOMY;  Surgeon: Ardis Hughs, MD;  [facility];  Service: Urology;  Laterality: Left;   TOTAL ABDOMINAL HYSTERECTOMY  2001   TOTAL KNEE ARTHROPLASTY Bilateral 01/24/2020   Procedure: TOTAL KNEE BILATERAL;  Surgeon: Gaynelle Arabian, MD;  [facility];  Service: Orthopedics;  Laterality: Bilateral;   WISDOM TOOTH EXTRACTION      There were no vitals filed for this visit.   Subjective Assessment - 08/09/20 1133     Subjective States that the laying down exercises are easier for her to do but the step exercise is difficult for her to do safely at home.    Pertinent History afib, bilateral knee replacement    Limitations Walking;Standing;House hold activities    Patient Stated Goals wants exercises to get up/down and to have motivation for her exercises    Currently in Pain? No/denies  [facility] PT Assessment - 08/09/20 0001       Assessment   Medical Diagnosis hx of bilateral knee replacement    Referring Provider (PT) lawrence fusco, MD    Onset Date/Surgical Date 01/24/20                           [facility] Adult PT Treatment/Exercise - 08/09/20 0001       Knee/Hip Exercises: Stretches   Active Hamstring Stretch Both;3 reps;30 seconds      Knee/Hip Exercises: Standing   Lateral Step Up Both;Hand Hold: 0;Step Height: 4"   5 minutes   Step Down Hand Hold: 1;Hand Hold: 2;Step Height: 4"   10 minutes total - switching between legs - cues to bend knee and to not reach with descending leg     Knee/Hip Exercises: Seated   Sit to Sand without UE support   pauses, slow lower- 2x20- hands out in front - chest up tall                   PT Education - 08/09/20 1157     Education Details on ways to safely perform step down exercise at home.     Person(s) Educated Patient    Methods Explanation    Comprehension Verbalized understanding              PT Short Term Goals - 07/19/20 0954       PT SHORT TERM GOAL #1   Title Patient will be able to ambulate 2 minutes without any circumduction of right leg or left knee "acting funny" per patient.    Time 5    Period Weeks    Status New    Target Date 08/23/20      PT SHORT TERM GOAL #2   Title Patient will demonstrate at least 8-115 degrees of knee ROM bilaterally    Time 5    Period Weeks    Status New    Target Date 08/23/20      PT SHORT TERM GOAL #3   Title Patient will be independent in self management strategies to improve quality of life and functional outcomes.    Time 5    Period Weeks    Status New    Target Date 08/23/20               PT Long Term Goals - 07/19/20 0955       PT LONG TERM GOAL #1   Title Patient will be able to go up and down steps with reciprocal gait pattern with railing as needed to improve ability to climb up and down steps    Time 10    Period Weeks    Status New    Target Date 09/27/20      PT LONG TERM GOAL #2   Title Patient will improve on FOTO score to meet predicted outcomes to demonstrate improved functional mobility.    Time 10    Period Weeks    Status New    Target Date 09/27/20      PT LONG TERM GOAL #3   Title Patient will report at least 50% improvement in overall symptoms and/or function to demonstrate improved functional mobility    Time 10    Period Weeks    Status New    Target Date 09/27/20  Plan - 08/09/20 1204     Clinical Impression Statement Session focused on educating patient and brainstorming ways to mimic step down exercise at home to improve ability to go stairs and improve eccentric strength of legs. Focused on 3 exercises and having patient perform these to improve knee bend and functional knee strength. Fatigue noted end of session.    Personal Factors and  Comorbidities Comorbidity 1;Comorbidity 2    Comorbidities history of multiple cancers, bilateral TKA 01/24/20    Examination-Activity Limitations Squat;Stairs;Stand;Transfers;Locomotion Level;Lift;Carry;Bend;Bed Mobility    Examination-Participation Restrictions Meal Prep;Laundry;Community Activity;Cleaning    Stability/Clinical Decision Making Stable/Uncomplicated    Rehab Potential Fair    PT Frequency Other (comment)   total of 6 visits over 10 week certification with max of 1 visit/week   PT Duration Other (comment)   10 weeks   PT Treatment/Interventions ADLs/Self Care Home Management;Cryotherapy;Therapeutic exercise;Therapeutic activities;Manual techniques;Gait training;Neuromuscular re-education;Patient/family education;Dry needling    PT Next Visit Plan focus on developing strong HEP and work on adherence strategies to HEP, knee ROM and functional strengthening of hips and knees    PT Home Exercise Plan heels slides with stretching in both extension and flexion; patella mob, prone knee hang and prone knee flexion stretch, step down             Patient will benefit from skilled therapeutic intervention in order to improve the following deficits and impairments:  Pain, Abnormal gait, Increased edema, Decreased activity tolerance, Decreased mobility, Decreased balance, Decreased range of motion, Difficulty walking  Visit Diagnosis: Muscle weakness (generalized)  Difficulty in walking, not elsewhere classified  Chronic pain of left knee  Chronic pain of right knee     Problem List Patient Active Problem List   Diagnosis Date Noted   Atrial fibrillation ([place]) 04/01/2020   Encounter for therapeutic drug monitoring 04/01/2020   Acute blood loss anemia 02/09/2020   Atrial fibrillation with rapid ventricular response ([place]) 01/29/2020   Hypoxia 01/29/2020   Hypothyroidism 01/29/2020   OA (osteoarthritis) of knee 01/24/2020   Bilateral primary osteoarthritis of knee 01/24/2020    Seasonal and perennial allergic rhinitis 12/24/2019   Allergic contact dermatitis 12/24/2019   Mild intermittent asthma, uncomplicated [id number]   IBS (irritable bowel syndrome) 04/11/2019   History of colonic polyps 11/25/2016   Renal mass 08/21/2016   Upper airway cough syndrome 07/06/2014   Essential hypertension 07/06/2014   Personal history of colonic polyps 09/17/2010   BACK PAIN 09/14/2008   HIRSUTISM 06/15/2008   GLUCOSE INTOLERANCE 01/09/2008   NEOPLASM, MALIGNANT, UTERUS 12/27/2006   THYROID NODULE 12/27/2006   DIVERTICULAR DISEASE 06/03/2006   OBESITY NOS 03/09/2006   DEPRESSION 03/09/2006   ALLERGIC RHINITIS 03/09/2006   ASTHMA 03/09/2006   GERD 03/09/2006   ELEVATED BLOOD PRESSURE WITHOUT DIAGNOSIS OF HYPERTENSION 03/09/2006   12:09 PM, 08/09/20 Jerene Pitch, DPT Physical Therapy with [facility]  [phone] office   [place] [address] [place], [place], [address] Phone: [phone]   Fax:  [phone]  Name: Melissa Velazquez MRN: [id number] Date of Birth: [birth year]

## 2020-08-12 ENCOUNTER — Other Ambulatory Visit: Payer: Self-pay

## 2020-08-12 ENCOUNTER — Ambulatory Visit (INDEPENDENT_AMBULATORY_CARE_PROVIDER_SITE_OTHER): Payer: Medicare Other | Admitting: *Deleted

## 2020-08-12 DIAGNOSIS — I480 Paroxysmal atrial fibrillation: Secondary | ICD-10-CM

## 2020-08-12 DIAGNOSIS — Z5181 Encounter for therapeutic drug level monitoring: Secondary | ICD-10-CM | POA: Diagnosis not present

## 2020-08-12 LAB — POCT INR: INR: 2.2 (ref 2.0–3.0)

## 2020-08-12 MED ORDER — WARFARIN SODIUM 5 MG PO TABS: ORAL_TABLET | ORAL | 1 refills | Status: DC

## 2020-08-12 NOTE — Patient Instructions (Signed)
Continue warfarin 1 1/2 tablets daily except 2 tablet on Tuesdays, Thursdays and Saturdays Recheck in 3 weeks

## 2020-08-16 ENCOUNTER — Encounter (HOSPITAL_COMMUNITY): Payer: Medicare Other | Admitting: Physical Therapy

## 2020-08-23 ENCOUNTER — Other Ambulatory Visit: Payer: Self-pay

## 2020-08-23 ENCOUNTER — Ambulatory Visit (HOSPITAL_COMMUNITY): Payer: Medicare Other | Attending: Internal Medicine | Admitting: Physical Therapy

## 2020-08-23 DIAGNOSIS — G8929 Other chronic pain: Secondary | ICD-10-CM | POA: Diagnosis not present

## 2020-08-23 DIAGNOSIS — R2689 Other abnormalities of gait and mobility: Secondary | ICD-10-CM

## 2020-08-23 DIAGNOSIS — R29898 Other symptoms and signs involving the musculoskeletal system: Secondary | ICD-10-CM | POA: Diagnosis not present

## 2020-08-23 DIAGNOSIS — M6281 Muscle weakness (generalized): Secondary | ICD-10-CM | POA: Diagnosis not present

## 2020-08-23 DIAGNOSIS — M25561 Pain in right knee: Secondary | ICD-10-CM | POA: Insufficient documentation

## 2020-08-23 DIAGNOSIS — R262 Difficulty in walking, not elsewhere classified: Secondary | ICD-10-CM

## 2020-08-23 DIAGNOSIS — M25562 Pain in left knee: Secondary | ICD-10-CM | POA: Diagnosis not present

## 2020-08-23 NOTE — Therapy (Signed)
[place], [place], [address] Phone: [phone]   Fax:  [phone]  Physical Therapy Treatment  Patient Details  Name: Melissa Velazquez MRN: [id number] Date of Birth: 1950-01-21 Referring Provider (PT): lawrence fusco, MD   Encounter Date: 08/23/2020   PT End of Session - 08/23/20 1037     Visit Number 4    Number of Visits 6    Date for PT Re-Evaluation 09/27/20    Authorization Type medicare primary adn A[employer] secondary - no Auth or VL    Progress Note Due on Visit 6    PT Start Time 1006    PT Stop Time 1045    PT Time Calculation (min) 39 min             Past Medical History:  Diagnosis Date   Allergic rhinitis    Anal fissure    Hx of    Anxiety    hx of   Arthritis    Asthma    none in last year    Chronic kidney disease    partial nephrectomy   Depression    hx of   Diabetes mellitus without complication ([place])    no meds   Diverticular disease    Dysrhythmia    palpitations   Eczema    Endometrial cancer ([place]) 2001   spread to appendix    Hemorrhoids    Hyperglycemia    Hypertension    Hypothyroidism    Kidney cancer, primary, with metastasis from kidney to other site [facility])    Obesity    Sebaceous cyst    Thyroid disease    Thyroid nodule    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    Past Surgical History:  Procedure Laterality Date   APPENDECTOMY  2008   BIOPSY  12/14/2016   Procedure: BIOPSY;  Surgeon: Rogene Houston, MD;  [facility];  Service: Endoscopy;;  colon   CHOLECYSTECTOMY  2008   COLONOSCOPY N/A 11/24/2012   Procedure: COLONOSCOPY;  Surgeon: Rogene Houston, MD;  [facility];  Service: Endoscopy;  Laterality: N/A;  830   COLONOSCOPY N/A 12/14/2016   Procedure: COLONOSCOPY;  Surgeon: Rogene Houston, MD;  [facility];  Service: Endoscopy;  Laterality: N/A;  12:00   POLYPECTOMY  12/14/2016   Procedure: POLYPECTOMY;   Surgeon: Rogene Houston, MD;  [facility];  Service: Endoscopy;;  colon   ROBOTIC ASSITED PARTIAL NEPHRECTOMY Left 08/21/2016   Procedure: XI ROBOTIC ASSITED PARTIAL NEPHRECTOMY;  Surgeon: Ardis Hughs, MD;  [facility];  Service: Urology;  Laterality: Left;   TOTAL ABDOMINAL HYSTERECTOMY  2001   TOTAL KNEE ARTHROPLASTY Bilateral 01/24/2020   Procedure: TOTAL KNEE BILATERAL;  Surgeon: Gaynelle Arabian, MD;  [facility];  Service: Orthopedics;  Laterality: Bilateral;   WISDOM TOOTH EXTRACTION      There were no vitals filed for this visit.   Subjective Assessment - 08/23/20 1024     Subjective Pt states that she is still having difficulty relying on left knee.    Pertinent History afib, bilateral knee replacement    Limitations Walking;Standing;House hold activities    Patient Stated Goals wants exercises to get up/down and to have motivation for her exercises    Currently in Pain? Yes    Pain Score 3     Pain Location Knee    Pain Orientation Right;Left    Pain Descriptors /  Indicators Aching    Pain Type Chronic pain    Pain Onset More than a month ago    Aggravating Factors  steps                               [facility] Adult PT Treatment/Exercise - 08/23/20 0001       Exercises   Exercises Knee/Hip      Knee/Hip Exercises: Stretches   Knee: Self-Stretch to increase Flexion Both;3 reps;20 seconds    Knee: Self-Stretch Limitations 12" stool    Gastroc Stretch Both;3 reps;20 seconds    Gastroc Stretch Limitations slant board      Knee/Hip Exercises: Standing   Heel Raises Both;10 reps    Knee Flexion Both;10 reps;15 reps    Knee Flexion Limitations 4#    Terminal Knee Extension Strengthening;Both;10 reps    Forward Step Up Both;10 reps;Hand Hold: 2;Step Height: 4"   Lt with 2" step up   Step Down Both;10 reps;Hand Hold: 2;Step Height: 4"   Lt 2"   Functional Squat 10 reps      Knee/Hip Exercises: Seated   Long Arc Quad Both;10  reps;Weights    Long Arc Quad Weight 4 lbs.                      PT Short Term Goals - 07/19/20 0954       PT SHORT TERM GOAL #1   Title Patient will be able to ambulate 2 minutes without any circumduction of right leg or left knee "acting funny" per patient.    Time 5    Period Weeks    Status New    Target Date 08/23/20      PT SHORT TERM GOAL #2   Title Patient will demonstrate at least 8-115 degrees of knee ROM bilaterally    Time 5    Period Weeks    Status New    Target Date 08/23/20      PT SHORT TERM GOAL #3   Title Patient will be independent in self management strategies to improve quality of life and functional outcomes.    Time 5    Period Weeks    Status New    Target Date 08/23/20               PT Long Term Goals - 07/19/20 0955       PT LONG TERM GOAL #1   Title Patient will be able to go up and down steps with reciprocal gait pattern with railing as needed to improve ability to climb up and down steps    Time 10    Period Weeks    Status New    Target Date 09/27/20      PT LONG TERM GOAL #2   Title Patient will improve on FOTO score to meet predicted outcomes to demonstrate improved functional mobility.    Time 10    Period Weeks    Status New    Target Date 09/27/20      PT LONG TERM GOAL #3   Title Patient will report at least 50% improvement in overall symptoms and/or function to demonstrate improved functional mobility    Time 10    Period Weeks    Status New    Target Date 09/27/20                   Plan - 08/23/20  1044     Clinical Impression Statement Treatment focused on strengthening LE, especially terminal extension.  PT instructed to complete 100 quad sets a day.  Decreased to 2' step up on LT to improve control    Personal Factors and Comorbidities Comorbidity 1;Comorbidity 2    Comorbidities history of multiple cancers, bilateral TKA 01/24/20    Examination-Activity Limitations  Squat;Stairs;Stand;Transfers;Locomotion Level;Lift;Carry;Bend;Bed Mobility    Examination-Participation Restrictions Meal Prep;Laundry;Community Activity;Cleaning    Stability/Clinical Decision Making Stable/Uncomplicated    Rehab Potential Fair    PT Frequency Other (comment)   total of 6 visits over 10 week certification with max of 1 visit/week   PT Duration Other (comment)   10 weeks   PT Treatment/Interventions ADLs/Self Care Home Management;Cryotherapy;Therapeutic exercise;Therapeutic activities;Manual techniques;Gait training;Neuromuscular re-education;Patient/family education;Dry needling    PT Next Visit Plan focus on developing strong HEP and work on adherence strategies to HEP, knee ROM and functional strengthening of hips and knees    PT Home Exercise Plan heels slides with stretching in both extension and flexion; patella mob, prone knee hang and prone knee flexion stretch, step down; 8/5 quad set. SAQ, terminal standing extension, functional squat and heel raises             Patient will benefit from skilled therapeutic intervention in order to improve the following deficits and impairments:  Pain, Abnormal gait, Increased edema, Decreased activity tolerance, Decreased mobility, Decreased balance, Decreased range of motion, Difficulty walking  Visit Diagnosis: Muscle weakness (generalized)  Difficulty in walking, not elsewhere classified  Chronic pain of left knee  Chronic pain of right knee  Other symptoms and signs involving the musculoskeletal system  Other abnormalities of gait and mobility     Problem List Patient Active Problem List   Diagnosis Date Noted   Atrial fibrillation ([place]) 04/01/2020   Encounter for therapeutic drug monitoring 04/01/2020   Acute blood loss anemia 02/09/2020   Atrial fibrillation with rapid ventricular response ([place]) 01/29/2020   Hypoxia 01/29/2020   Hypothyroidism 01/29/2020   OA (osteoarthritis) of knee 01/24/2020   Bilateral  primary osteoarthritis of knee 01/24/2020   Seasonal and perennial allergic rhinitis 12/24/2019   Allergic contact dermatitis 12/24/2019   Mild intermittent asthma, uncomplicated [id number]   IBS (irritable bowel syndrome) 04/11/2019   History of colonic polyps 11/25/2016   Renal mass 08/21/2016   Upper airway cough syndrome 07/06/2014   Essential hypertension 07/06/2014   Personal history of colonic polyps 09/17/2010   BACK PAIN 09/14/2008   HIRSUTISM 06/15/2008   GLUCOSE INTOLERANCE 01/09/2008   NEOPLASM, MALIGNANT, UTERUS 12/27/2006   THYROID NODULE 12/27/2006   DIVERTICULAR DISEASE 06/03/2006   OBESITY NOS 03/09/2006   DEPRESSION 03/09/2006   ALLERGIC RHINITIS 03/09/2006   ASTHMA 03/09/2006   GERD 03/09/2006   ELEVATED BLOOD PRESSURE WITHOUT DIAGNOSIS OF HYPERTENSION 03/09/2006   Rayetta Humphrey, PT CLT [phone]  08/23/2020, 10:50 AM  [facility] [address] [place], [place], [address] Phone: [phone]   Fax:  [phone]  Name: BRYAHNA ROATH MRN: [id number] Date of Birth: Jul 20, 1950

## 2020-08-30 ENCOUNTER — Ambulatory Visit (HOSPITAL_COMMUNITY): Payer: Medicare Other | Admitting: Physical Therapy

## 2020-08-30 ENCOUNTER — Other Ambulatory Visit: Payer: Self-pay

## 2020-08-30 ENCOUNTER — Encounter (HOSPITAL_COMMUNITY): Payer: Self-pay | Admitting: Physical Therapy

## 2020-08-30 DIAGNOSIS — R262 Difficulty in walking, not elsewhere classified: Secondary | ICD-10-CM | POA: Diagnosis not present

## 2020-08-30 DIAGNOSIS — M25562 Pain in left knee: Secondary | ICD-10-CM | POA: Diagnosis not present

## 2020-08-30 DIAGNOSIS — M6281 Muscle weakness (generalized): Secondary | ICD-10-CM

## 2020-08-30 DIAGNOSIS — G8929 Other chronic pain: Secondary | ICD-10-CM

## 2020-08-30 DIAGNOSIS — M25561 Pain in right knee: Secondary | ICD-10-CM | POA: Diagnosis not present

## 2020-08-30 DIAGNOSIS — R29898 Other symptoms and signs involving the musculoskeletal system: Secondary | ICD-10-CM | POA: Diagnosis not present

## 2020-08-30 NOTE — Patient Instructions (Signed)
Sunday  Monday Tuesday Wednesday Thursday Friday Saturday  8/14        8/21        8/28        9/4          X on days 30 minutes of exercises are done which includes feet together squat aka chair pose - holding for total of 3 minutes (not continuous) one week of exercises done 4x/week AND the 3 minute chair pose - you get a book! whole month you get to go out to eat with a friend

## 2020-08-30 NOTE — Therapy (Signed)
[place], [place], [address] Phone: [phone]   Fax:  [phone]  Physical Therapy Treatment  Patient Details  Name: Melissa Velazquez MRN: [id number] Date of Birth: 03-29-1950 Referring Provider (PT): lawrence fusco, MD   Encounter Date: 08/30/2020   PT End of Session - 08/30/20 0918     Visit Number 5    Number of Visits 6    Date for PT Re-Evaluation 09/27/20    Authorization Type medicare primary adn A[employer] secondary - no Auth or VL    Progress Note Due on Visit 6    PT Start Time 0918    PT Stop Time 0959    PT Time Calculation (min) 41 min             Past Medical History:  Diagnosis Date   Allergic rhinitis    Anal fissure    Hx of    Anxiety    hx of   Arthritis    Asthma    none in last year    Chronic kidney disease    partial nephrectomy   Depression    hx of   Diabetes mellitus without complication ([place])    no meds   Diverticular disease    Dysrhythmia    palpitations   Eczema    Endometrial cancer ([place]) 2001   spread to appendix    Hemorrhoids    Hyperglycemia    Hypertension    Hypothyroidism    Kidney cancer, primary, with metastasis from kidney to other site [facility])    Obesity    Sebaceous cyst    Thyroid disease    Thyroid nodule    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    Past Surgical History:  Procedure Laterality Date   APPENDECTOMY  2008   BIOPSY  12/14/2016   Procedure: BIOPSY;  Surgeon: Rogene Houston, MD;  [facility];  Service: Endoscopy;;  colon   CHOLECYSTECTOMY  2008   COLONOSCOPY N/A 11/24/2012   Procedure: COLONOSCOPY;  Surgeon: Rogene Houston, MD;  [facility];  Service: Endoscopy;  Laterality: N/A;  830   COLONOSCOPY N/A 12/14/2016   Procedure: COLONOSCOPY;  Surgeon: Rogene Houston, MD;  [facility];  Service: Endoscopy;  Laterality: N/A;  12:00   POLYPECTOMY  12/14/2016   Procedure: POLYPECTOMY;   Surgeon: Rogene Houston, MD;  [facility];  Service: Endoscopy;;  colon   ROBOTIC ASSITED PARTIAL NEPHRECTOMY Left 08/21/2016   Procedure: XI ROBOTIC ASSITED PARTIAL NEPHRECTOMY;  Surgeon: Ardis Hughs, MD;  [facility]: [facility];  Service: Urology;  Laterality: Left;   TOTAL ABDOMINAL HYSTERECTOMY  2001   TOTAL KNEE ARTHROPLASTY Bilateral 01/24/2020   Procedure: TOTAL KNEE BILATERAL;  Surgeon: Gaynelle Arabian, MD;  [facility]: [facility];  Service: Orthopedics;  Laterality: Bilateral;   WISDOM TOOTH EXTRACTION      There were no vitals filed for this visit.   Subjective Assessment - 08/30/20 0922     Subjective States that she did about 20 quad sets a day. States that she has had a lot of house things happen. Current pain is 3/10 in knees.  States she did her other exercises every couple of days. States that she knows what it is going to take she just needs to get into the habit.    Pertinent History afib, bilateral knee replacement    Limitations Walking;Standing;House hold activities    Patient Stated  Goals wants exercises to get up/down and to have motivation for her exercises    Currently in Pain? Yes    Pain Score 3     Pain Location Knee    Pain Orientation Right;Left    Pain Descriptors / Indicators Aching    Pain Onset More than a month ago                [facility] PT Assessment - 08/30/20 0001       Assessment   Medical Diagnosis hx of bilateral knee replacement    Referring Provider (PT) lawrence fusco, MD    Onset Date/Surgical Date 01/24/20                           [facility] Adult PT Treatment/Exercise - 08/30/20 0001       Knee/Hip Exercises: Standing   Other Standing Knee Exercises steps 2" - left leg very difficult - stopped after 5 minutes, fwd leg slides - too difficult; chair pose - 5 minutes total with rests      Knee/Hip Exercises: Seated   Sit to Sand without UE support;2 sets;15 reps   with yellow weighted ball                    PT Education - 08/30/20 0927     Education Details brainstormed ideas to help improve adherence to HEP. - rewards, accountability, tracking, classes, personal trainer. on how to make a change, on strategies to make change    Person(s) Educated Patient    Methods Explanation    Comprehension Verbalized understanding              PT Short Term Goals - 07/19/20 0954       PT SHORT TERM GOAL #1   Title Patient will be able to ambulate 2 minutes without any circumduction of right leg or left knee "acting funny" per patient.    Time 5    Period Weeks    Status New    Target Date 08/23/20      PT SHORT TERM GOAL #2   Title Patient will demonstrate at least 8-115 degrees of knee ROM bilaterally    Time 5    Period Weeks    Status New    Target Date 08/23/20      PT SHORT TERM GOAL #3   Title Patient will be independent in self management strategies to improve quality of life and functional outcomes.    Time 5    Period Weeks    Status New    Target Date 08/23/20               PT Long Term Goals - 07/19/20 0955       PT LONG TERM GOAL #1   Title Patient will be able to go up and down steps with reciprocal gait pattern with railing as needed to improve ability to climb up and down steps    Time 10    Period Weeks    Status New    Target Date 09/27/20      PT LONG TERM GOAL #2   Title Patient will improve on FOTO score to meet predicted outcomes to demonstrate improved functional mobility.    Time 10    Period Weeks    Status New    Target Date 09/27/20      PT LONG TERM GOAL #3   Title Patient will report  at least 50% improvement in overall symptoms and/or function to demonstrate improved functional mobility    Time 10    Period Weeks    Status New    Target Date 09/27/20                   Plan - 08/30/20 0953     Clinical Impression Statement Patient continues to have difficulties motivating herself at home to complete her  exercises. Session focused on adherence to HEP and strategies to improve this. Made calendar for patient to keep track with specific goals and developed weekly and monthly reward system to help motivate patient. Answered all questions. Will follow up with patient about adherence to   HEP during next visit.    Personal Factors and Comorbidities Comorbidity 1;Comorbidity 2    Comorbidities history of multiple cancers, bilateral TKA 01/24/20    Examination-Activity Limitations Squat;Stairs;Stand;Transfers;Locomotion Level;Lift;Carry;Bend;Bed Mobility    Examination-Participation Restrictions Meal Prep;Laundry;Community Activity;Cleaning    Stability/Clinical Decision Making Stable/Uncomplicated    Rehab Potential Fair    PT Frequency Other (comment)   total of 6 visits over 10 week certification with max of 1 visit/week   PT Duration Other (comment)   10 weeks   PT Treatment/Interventions ADLs/Self Care Home Management;Cryotherapy;Therapeutic exercise;Therapeutic activities;Manual techniques;Gait training;Neuromuscular re-education;Patient/family education;Dry needling    PT Next Visit Plan f/u with adherence and tracking. focus on developing strong HEP and work on adherence strategies to HEP, knee ROM and functional strengthening of hips and knees    PT Home Exercise Plan heels slides with stretching in both extension and flexion; patella mob, prone knee hang and prone knee flexion stretch, step down; 8/5 quad set. SAQ, terminal standing extension, functional squat and heel raises;  8/12 chair pose             Patient will benefit from skilled therapeutic intervention in order to improve the following deficits and impairments:  Pain, Abnormal gait, Increased edema, Decreased activity tolerance, Decreased mobility, Decreased balance, Decreased range of motion, Difficulty walking  Visit Diagnosis: Muscle weakness (generalized)  Difficulty in walking, not elsewhere classified  Chronic pain of left  knee  Chronic pain of right knee     Problem List Patient Active Problem List   Diagnosis Date Noted   Atrial fibrillation ([place]) 04/01/2020   Encounter for therapeutic drug monitoring 04/01/2020   Acute blood loss anemia 02/09/2020   Atrial fibrillation with rapid ventricular response ([place]) 01/29/2020   Hypoxia 01/29/2020   Hypothyroidism 01/29/2020   OA (osteoarthritis) of knee 01/24/2020   Bilateral primary osteoarthritis of knee 01/24/2020   Seasonal and perennial allergic rhinitis 12/24/2019   Allergic contact dermatitis 12/24/2019   Mild intermittent asthma, uncomplicated [id number]   IBS (irritable bowel syndrome) 04/11/2019   History of colonic polyps 11/25/2016   Renal mass 08/21/2016   Upper airway cough syndrome 07/06/2014   Essential hypertension 07/06/2014   Personal history of colonic polyps 09/17/2010   BACK PAIN 09/14/2008   HIRSUTISM 06/15/2008   GLUCOSE INTOLERANCE 01/09/2008   NEOPLASM, MALIGNANT, UTERUS 12/27/2006   THYROID NODULE 12/27/2006   DIVERTICULAR DISEASE 06/03/2006   OBESITY NOS 03/09/2006   DEPRESSION 03/09/2006   ALLERGIC RHINITIS 03/09/2006   ASTHMA 03/09/2006   GERD 03/09/2006   ELEVATED BLOOD PRESSURE WITHOUT DIAGNOSIS OF HYPERTENSION 03/09/2006   10:18 AM, 08/30/20 Jerene Pitch, DPT Physical Therapy with [facility]  [phone] office   [place], [place], [address] Phone:  [phone]   Fax:  [phone]  Name: Melissa Velazquez MRN: [id number] Date of Birth: 10/04/1950

## 2020-09-02 ENCOUNTER — Ambulatory Visit (INDEPENDENT_AMBULATORY_CARE_PROVIDER_SITE_OTHER): Payer: Medicare Other | Admitting: *Deleted

## 2020-09-02 DIAGNOSIS — Z5181 Encounter for therapeutic drug level monitoring: Secondary | ICD-10-CM | POA: Diagnosis not present

## 2020-09-02 DIAGNOSIS — I480 Paroxysmal atrial fibrillation: Secondary | ICD-10-CM | POA: Diagnosis not present

## 2020-09-02 LAB — POCT INR: INR: 1.7 — AB (ref 2.0–3.0)

## 2020-09-02 NOTE — Patient Instructions (Signed)
Increase warfarin to 2 tablets daily except 1 1/2 tablets on Fridays Recheck in 3 weeks

## 2020-09-06 ENCOUNTER — Ambulatory Visit (HOSPITAL_COMMUNITY): Payer: Medicare Other | Admitting: Physical Therapy

## 2020-09-06 ENCOUNTER — Other Ambulatory Visit: Payer: Self-pay

## 2020-09-06 DIAGNOSIS — R262 Difficulty in walking, not elsewhere classified: Secondary | ICD-10-CM

## 2020-09-06 DIAGNOSIS — M25561 Pain in right knee: Secondary | ICD-10-CM

## 2020-09-06 DIAGNOSIS — M6281 Muscle weakness (generalized): Secondary | ICD-10-CM | POA: Diagnosis not present

## 2020-09-06 DIAGNOSIS — G8929 Other chronic pain: Secondary | ICD-10-CM | POA: Diagnosis not present

## 2020-09-06 DIAGNOSIS — R29898 Other symptoms and signs involving the musculoskeletal system: Secondary | ICD-10-CM | POA: Diagnosis not present

## 2020-09-06 DIAGNOSIS — M25562 Pain in left knee: Secondary | ICD-10-CM

## 2020-09-06 NOTE — Therapy (Signed)
[facility] [address] [place], [place], [address] Phone: [phone]   Fax:  [phone]  Physical Therapy Treatment  Patient Details  Name: Melissa Velazquez MRN: [id number] Date of Birth: 1950/06/07 Referring Provider (PT): lawrence fusco, MD Progress Note Reporting Period 07/19/2020 to 09/06/2020  See note below for Objective Data and Assessment of Progress/Goals.      Encounter Date: 09/06/2020   PT End of Session - 09/06/20 1024     Visit Number 6    Number of Visits 10    Date for PT Re-Evaluation 10/04/20    Authorization Type medicare primary adn AARP secondary - no Auth or VL    Progress Note Due on Visit 10    PT Start Time 1000    PT Stop Time 1103    PT Time Calculation (min) 63 min    Activity Tolerance Patient tolerated treatment well    Behavior During Therapy WFL for tasks assessed/performed             Past Medical History:  Diagnosis Date   Allergic rhinitis    Anal fissure    Hx of    Anxiety    hx of   Arthritis    Asthma    none in last year    Chronic kidney disease    partial nephrectomy   Depression    hx of   Diabetes mellitus without complication ([place])    no meds   Diverticular disease    Dysrhythmia    palpitations   Eczema    Endometrial cancer ([place]) 2001   spread to appendix    Hemorrhoids    Hyperglycemia    Hypertension    Hypothyroidism    Kidney cancer, primary, with metastasis from kidney to other site [facility])    Obesity    Sebaceous cyst    Thyroid disease    Thyroid nodule    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    Past Surgical History:  Procedure Laterality Date   APPENDECTOMY  2008   BIOPSY  12/14/2016   Procedure: BIOPSY;  Surgeon: Rogene Houston, MD;  [facility];  Service: Endoscopy;;  colon   CHOLECYSTECTOMY  2008   COLONOSCOPY N/A 11/24/2012   Procedure: COLONOSCOPY;  Surgeon: Rogene Houston, MD;  [facility];  Service:  Endoscopy;  Laterality: N/A;  830   COLONOSCOPY N/A 12/14/2016   Procedure: COLONOSCOPY;  Surgeon: Rogene Houston, MD;  [facility];  Service: Endoscopy;  Laterality: N/A;  12:00   POLYPECTOMY  12/14/2016   Procedure: POLYPECTOMY;  Surgeon: Rogene Houston, MD;  [facility];  Service: Endoscopy;;  colon   ROBOTIC ASSITED PARTIAL NEPHRECTOMY Left 08/21/2016   Procedure: XI ROBOTIC ASSITED PARTIAL NEPHRECTOMY;  Surgeon: Ardis Hughs, MD;  [facility];  Service: Urology;  Laterality: Left;   TOTAL ABDOMINAL HYSTERECTOMY  2001   TOTAL KNEE ARTHROPLASTY Bilateral 01/24/2020   Procedure: TOTAL KNEE BILATERAL;  Surgeon: Gaynelle Arabian, MD;  [facility];  Service: Orthopedics;  Laterality: Bilateral;   WISDOM TOOTH EXTRACTION      There were no vitals filed for this visit.   Subjective Assessment - 09/06/20 1018     Subjective Pt states that her pain varies, she is at a 2-3 most the time.  Both knees hurt but sometimes it might be the RT hurting and the Lt is pain free.  She realizes that she  is not doing her exercises as often as she should be.    Pertinent History afib, bilateral knee replacement    Limitations Walking;Standing;House hold activities    How long can you walk comfortably? able to walk for 30 mintutes    Patient Stated Goals wants exercises to get up/down and to have motivation for her exercises    Currently in Pain? Yes    Pain Score 2     Pain Location Knee    Pain Orientation Right;Left    Pain Descriptors / Indicators Aching    Pain Type Chronic pain    Pain Onset More than a month ago    Pain Frequency Intermittent    Aggravating Factors  steps    Pain Relieving Factors rest                [facility] PT Assessment - 09/06/20 0001       Assessment   Medical Diagnosis hx of bilateral knee replacement    Referring Provider (PT) lawrence fusco, MD    Onset Date/Surgical Date 01/24/20    Prior Therapy yes for her knees       Prior Function   Level of Independence Independent      Functional Tests   Functional tests Single leg stance      Single Leg Stance   Comments 9" max on both      AROM   Right Knee Extension 8   was lacking12 degrees   Right Knee Flexion 115    Left Knee Extension 2   was lacking 15   Left Knee Flexion 117   was 110     Strength   Strength Assessment Site Knee;Hip    Right/Left Hip Right;Left    Right Hip Flexion 4+/5    Right Hip External Rotation  4/5    Right Hip ABduction 5/5    Left Hip Flexion 2+/5    Left Hip Extension 5/5    Left Hip ABduction 5/5    Right/Left Knee Right;Left    Right Knee Flexion 5/5    Right Knee Extension 5/5    Left Knee Flexion 5/5    Left Knee Extension 4/5      Ambulation/Gait   Ambulation/Gait Yes    Ambulation Distance (Feet) 454 Feet   was 345   Assistive device None    Gait Pattern Decreased stride length;Decreased hip/knee flexion - right;Decreased hip/knee flexion - left;Right circumduction;Antalgic;Trunk flexed    Gait velocity decreased    Gait Comments 2MW                           [facility] Adult PT Treatment/Exercise - 09/06/20 0001       Transfers   Five time sit to stand comments  12.97   was 18.35     Ambulation/Gait   Stairs Yes    Stairs Assistance 6: Modified independent (Device/Increase time)    Stair Management Technique One rail Right;Alternating pattern   on 12" step pt had to pull herself up when lifting Rt leg to next step ; 4" step able to power up with LE     Exercises   Exercises Knee/Hip      Knee/Hip Exercises: Aerobic   Stationary Bike level 3 x 7 minutes      Knee/Hip Exercises: Standing   SLS x 3 B with 9 seconds max on both      Knee/Hip Exercises: Seated   Long  Arc Automotive engineer;Other (comment)    Long Arc Quad Limitations green tband      Knee/Hip Exercises: Supine   Straight Leg Raises Limitations 10 on Lt  knee bent as pt is unable to complete with knee  straight.                      PT Short Term Goals - 09/06/20 1020       PT SHORT TERM GOAL #1   Title Patient will be able to ambulate 2 minutes without any circumduction of right leg or left knee "acting funny" per patient.    Time 5    Period Weeks    Status On-going      PT SHORT TERM GOAL #2   Title Patient will demonstrate at least 8-115 degrees of knee ROM bilaterally    Time 5    Period Weeks    Status Achieved      PT SHORT TERM GOAL #3   Title Patient will be independent in self management strategies to improve quality of life and functional outcomes.    Time 5    Period Weeks    Status Achieved               PT Long Term Goals - 09/06/20 1112       PT LONG TERM GOAL #1   Title Patient will be able to go up and down steps with reciprocal gait pattern with railing as needed to improve ability to climb up and down steps    Baseline able to do, however pt states that she is holding on very tightly to the railing.; when therapist observes pt she is actually pulling herself up with the hand rails.    Time 10    Period Weeks    Status On-going      PT LONG TERM GOAL #2   Status Deferred   no foto taken     PT LONG TERM GOAL #3   Title Patient will report at least 50% improvement in overall symptoms and/or function to demonstrate improved functional mobility    Baseline pt fells that she is 32.5-40% improved    Time 10    Period Weeks    Status On-going      PT LONG TERM GOAL #4   Title Patient will increase gait speed to at least 1.2 m/s in order to be able to cross the street safely.    Baseline 1 m/s    Time 6    Period Weeks    Status Achieved                   Plan - 09/06/20 1108     Clinical Impression Statement PT reassessed with noted improvement in all aspects measured at initial evaluation.  PT still demonstrates weakness of Lt hip flexors and knee extension which is limiting pt ability to climb steps.  PT also has  noted deficits in balance.  Ms. Ketcham will benefit from continuing skilled PT to improve her strength and balance to be able to safely nagavate her steps.    Personal Factors and Comorbidities Comorbidity 1;Comorbidity 2    Comorbidities history of multiple cancers, bilateral TKA 01/24/20    Examination-Activity Limitations Squat;Stairs;Stand;Transfers;Locomotion Level;Lift;Carry;Bend;Bed Mobility    Examination-Participation Restrictions Meal Prep;Laundry;Community Activity;Cleaning    Stability/Clinical Decision Making Stable/Uncomplicated    Rehab Potential Fair    PT Frequency Other (comment)   total of 6 visits over  10 week certification with max of 1 visit/week   PT Duration Other (comment)   10 weeks   PT Treatment/Interventions ADLs/Self Care Home Management;Cryotherapy;Therapeutic exercise;Therapeutic activities;Manual techniques;Gait training;Neuromuscular re-education;Patient/family education;Dry needling    PT Next Visit Plan focus on functional strength and balance.    PT Home Exercise Plan heels slides with stretching in both extension and flexion; patella mob, prone knee hang and prone knee flexion stretch, step down; 8/5 quad set. SAQ, terminal standing extension, functional squat and heel raises;  8/12 chair pose; 8/19:  LAQ with theraband, emphasised quad sets again and bent knee raise for hip flexion strength.             Patient will benefit from skilled therapeutic intervention in order to improve the following deficits and impairments:  Pain, Abnormal gait, Increased edema, Decreased activity tolerance, Decreased mobility, Decreased balance, Decreased range of motion, Difficulty walking  Visit Diagnosis: Difficulty in walking, not elsewhere classified - Plan: PT plan of care cert/re-cert  Muscle weakness (generalized) - Plan: PT plan of care cert/re-cert  Chronic pain of left knee - Plan: PT plan of care cert/re-cert  Chronic pain of right knee - Plan: PT plan of  care cert/re-cert     Problem List Patient Active Problem List   Diagnosis Date Noted   Atrial fibrillation ([place]) 04/01/2020   Encounter for therapeutic drug monitoring 04/01/2020   Acute blood loss anemia 02/09/2020   Atrial fibrillation with rapid ventricular response ([place]) 01/29/2020   Hypoxia 01/29/2020   Hypothyroidism 01/29/2020   OA (osteoarthritis) of knee 01/24/2020   Bilateral primary osteoarthritis of knee 01/24/2020   Seasonal and perennial allergic rhinitis 12/24/2019   Allergic contact dermatitis 12/24/2019   Mild intermittent asthma, uncomplicated [id number]   IBS (irritable bowel syndrome) 04/11/2019   History of colonic polyps 11/25/2016   Renal mass 08/21/2016   Upper airway cough syndrome 07/06/2014   Essential hypertension 07/06/2014   Personal history of colonic polyps 09/17/2010   BACK PAIN 09/14/2008   HIRSUTISM 06/15/2008   GLUCOSE INTOLERANCE 01/09/2008   NEOPLASM, MALIGNANT, UTERUS 12/27/2006   THYROID NODULE 12/27/2006   DIVERTICULAR DISEASE 06/03/2006   OBESITY NOS 03/09/2006   DEPRESSION 03/09/2006   ALLERGIC RHINITIS 03/09/2006   ASTHMA 03/09/2006   GERD 03/09/2006   ELEVATED BLOOD PRESSURE WITHOUT DIAGNOSIS OF HYPERTENSION 03/09/2006   Rayetta Humphrey, PT CLT [phone]  09/06/2020, 11:16 AM  [facility] [address] [place], [place], [address] Phone: [phone]   Fax:  [phone]  Name: Melissa Velazquez MRN: [id number] Date of Birth: 09/20/1950

## 2020-09-09 ENCOUNTER — Ambulatory Visit (HOSPITAL_COMMUNITY): Payer: Medicare Other

## 2020-09-17 ENCOUNTER — Telehealth (HOSPITAL_COMMUNITY): Payer: Self-pay | Admitting: Physical Therapy

## 2020-09-17 ENCOUNTER — Ambulatory Visit (HOSPITAL_COMMUNITY): Payer: Medicare Other | Admitting: Physical Therapy

## 2020-09-17 NOTE — Telephone Encounter (Signed)
No Show. Called patient and she felt she had too much going on today to come in. Scheduled patient for tomorrow afternoon at 2 pm to recapture visit.   8:12 AM, 09/17/20 Jerene Pitch, DPT Physical Therapy with [facility]  [phone] office

## 2020-09-18 ENCOUNTER — Other Ambulatory Visit: Payer: Self-pay

## 2020-09-18 ENCOUNTER — Encounter (HOSPITAL_COMMUNITY): Payer: Self-pay | Admitting: Physical Therapy

## 2020-09-18 ENCOUNTER — Ambulatory Visit (HOSPITAL_COMMUNITY): Payer: Medicare Other | Admitting: Physical Therapy

## 2020-09-18 DIAGNOSIS — R262 Difficulty in walking, not elsewhere classified: Secondary | ICD-10-CM

## 2020-09-18 DIAGNOSIS — M6281 Muscle weakness (generalized): Secondary | ICD-10-CM

## 2020-09-18 DIAGNOSIS — I10 Essential (primary) hypertension: Secondary | ICD-10-CM | POA: Diagnosis not present

## 2020-09-18 DIAGNOSIS — R29898 Other symptoms and signs involving the musculoskeletal system: Secondary | ICD-10-CM | POA: Diagnosis not present

## 2020-09-18 DIAGNOSIS — K589 Irritable bowel syndrome without diarrhea: Secondary | ICD-10-CM | POA: Diagnosis not present

## 2020-09-18 DIAGNOSIS — G8929 Other chronic pain: Secondary | ICD-10-CM

## 2020-09-18 DIAGNOSIS — M25562 Pain in left knee: Secondary | ICD-10-CM | POA: Diagnosis not present

## 2020-09-18 DIAGNOSIS — I4891 Unspecified atrial fibrillation: Secondary | ICD-10-CM | POA: Diagnosis not present

## 2020-09-18 DIAGNOSIS — M1991 Primary osteoarthritis, unspecified site: Secondary | ICD-10-CM | POA: Diagnosis not present

## 2020-09-18 DIAGNOSIS — E039 Hypothyroidism, unspecified: Secondary | ICD-10-CM | POA: Diagnosis not present

## 2020-09-18 DIAGNOSIS — M25561 Pain in right knee: Secondary | ICD-10-CM | POA: Diagnosis not present

## 2020-09-18 NOTE — Therapy (Signed)
[place], [place], [address] Phone: [phone]   Fax:  [phone]  Physical Therapy Treatment  Patient Details  Name: Melissa Velazquez MRN: [id number] Date of Birth: 06/30/1950 Referring Provider (PT): lawrence fusco, MD   Encounter Date: 09/18/2020   PT End of Session - 09/18/20 1405     Visit Number 7    Number of Visits 10    Date for PT Re-Evaluation 10/04/20    Authorization Type medicare primary adn A[employer] secondary - no Auth or VL    Progress Note Due on Visit 10    PT Start Time 1403    PT Stop Time 1441    PT Time Calculation (min) 38 min    Activity Tolerance Patient tolerated treatment well    Behavior During Therapy [facility] for tasks assessed/performed             Past Medical History:  Diagnosis Date   Allergic rhinitis    Anal fissure    Hx of    Anxiety    hx of   Arthritis    Asthma    none in last year    Chronic kidney disease    partial nephrectomy   Depression    hx of   Diabetes mellitus without complication ([place])    no meds   Diverticular disease    Dysrhythmia    palpitations   Eczema    Endometrial cancer ([place]) 2001   spread to appendix    Hemorrhoids    Hyperglycemia    Hypertension    Hypothyroidism    Kidney cancer, primary, with metastasis from kidney to other site [facility])    Obesity    Sebaceous cyst    Thyroid disease    Thyroid nodule    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    Past Surgical History:  Procedure Laterality Date   APPENDECTOMY  2008   BIOPSY  12/14/2016   Procedure: BIOPSY;  Surgeon: Rogene Houston, MD;  [facility];  Service: Endoscopy;;  colon   CHOLECYSTECTOMY  2008   COLONOSCOPY N/A 11/24/2012   Procedure: COLONOSCOPY;  Surgeon: Rogene Houston, MD;  [facility];  Service: Endoscopy;  Laterality: N/A;  830   COLONOSCOPY N/A 12/14/2016   Procedure: COLONOSCOPY;  Surgeon: Rogene Houston, MD;  [facility];  Service: Endoscopy;  Laterality: N/A;  12:00   POLYPECTOMY  12/14/2016   Procedure: POLYPECTOMY;  Surgeon: Rogene Houston, MD;  [facility];  Service: Endoscopy;;  colon   ROBOTIC ASSITED PARTIAL NEPHRECTOMY Left 08/21/2016   Procedure: XI ROBOTIC ASSITED PARTIAL NEPHRECTOMY;  Surgeon: Ardis Hughs, MD;  [facility];  Service: Urology;  Laterality: Left;   TOTAL ABDOMINAL HYSTERECTOMY  2001   TOTAL KNEE ARTHROPLASTY Bilateral 01/24/2020   Procedure: TOTAL KNEE BILATERAL;  Surgeon: Gaynelle Arabian, MD;  [facility];  Service: Orthopedics;  Laterality: Bilateral;   WISDOM TOOTH EXTRACTION      There were no vitals filed for this visit.   Subjective Assessment - 09/18/20 1407     Subjective States that her pain is still only when she gets up. States she has been doing her exercises but feels like she could do them more. States her glutes are sore as she has been practicing walking heel to toe.    Pertinent History afib, bilateral knee replacement    Limitations Walking;Standing;House hold activities  How long can you walk comfortably? able to walk for 30 mintutes    Patient Stated Goals wants exercises to get up/down and to have motivation for her exercises    Currently in Pain? Yes    Pain Score 5     Pain Location Knee    Pain Orientation Right;Left    Pain Descriptors / Indicators Aching    Pain Onset More than a month ago    Aggravating Factors  with movement                [facility] PT Assessment - 09/18/20 0001       Assessment   Medical Diagnosis hx of bilateral knee replacement    Referring Provider (PT) lawrence fusco, MD    Onset Date/Surgical Date 01/24/20                           [facility] Adult PT Treatment/Exercise - 09/18/20 0001       Knee/Hip Exercises: Standing   Forward Step Up Both;10 reps;Hand Hold: 2;Step Height: 4"   2 sets   Other Standing Knee Exercises chair pose x5 15" holds - pain in right  knee    Other Standing Knee Exercises TG  level 12 x1 1 minutes total B  - single leg leg press, then level 14 (16 too challenging/painful) 3x10 B                    PT Education - 09/18/20 1437     Education Details on aquatic therapy, on reduced body weight environment, on current presentation and focus moving forward    Person(s) Educated Patient    Methods Explanation    Comprehension Verbalized understanding              PT Short Term Goals - 09/06/20 1020       PT SHORT TERM GOAL #1   Title Patient will be able to ambulate 2 minutes without any circumduction of right leg or left knee "acting funny" per patient.    Time 5    Period Weeks    Status On-going      PT SHORT TERM GOAL #2   Title Patient will demonstrate at least 8-115 degrees of knee ROM bilaterally    Time 5    Period Weeks    Status Achieved      PT SHORT TERM GOAL #3   Title Patient will be independent in self management strategies to improve quality of life and functional outcomes.    Time 5    Period Weeks    Status Achieved               PT Long Term Goals - 09/06/20 1112       PT LONG TERM GOAL #1   Title Patient will be able to go up and down steps with reciprocal gait pattern with railing as needed to improve ability to climb up and down steps    Baseline able to do, however pt states that she is holding on very tightly to the railing.; when therapist observes pt she is actually pulling herself up with the hand rails.    Time 10    Period Weeks    Status On-going      PT LONG TERM GOAL #2   Status Deferred   no foto taken     PT LONG TERM GOAL #3   Title Patient will report at  least 50% improvement in overall symptoms and/or function to demonstrate improved functional mobility    Baseline pt fells that she is 32.5-40% improved    Time 10    Period Weeks    Status On-going      PT LONG TERM GOAL #4   Title Patient will increase gait speed to at least 1.2 m/s in  order to be able to cross the street safely.    Baseline 1 m/s    Time 6    Period Weeks    Status Achieved                   Plan - 09/18/20 1419     Clinical Impression Statement Session focused on continued strengthening but standing exercises initially caused increased in right knee pain. Transitioned to total gym for reduced body weight and patient tolerated this well. Fatigue in leg noted. Transitioned back to step ups which was challenging as patient has tendency to lock out her knee prior to stepping up, when cued patient to bend her knee and push through her leg, this was very challenging but overall movement improved. Will follow up with this next session.    Personal Factors and Comorbidities Comorbidity 1;Comorbidity 2    Comorbidities history of multiple cancers, bilateral TKA 01/24/20    Examination-Activity Limitations Squat;Stairs;Stand;Transfers;Locomotion Level;Lift;Carry;Bend;Bed Mobility    Examination-Participation Restrictions Meal Prep;Laundry;Community Activity;Cleaning    Stability/Clinical Decision Making Stable/Uncomplicated    Rehab Potential Fair    PT Frequency Other (comment)   total of 6 visits over 10 week certification with max of 1 visit/week   PT Duration Other (comment)   10 weeks   PT Treatment/Interventions ADLs/Self Care Home Management;Cryotherapy;Therapeutic exercise;Therapeutic activities;Manual techniques;Gait training;Neuromuscular re-education;Patient/family education;Dry needling    PT Next Visit Plan Total gym, follow up with bending knee to step up step (locks out and shifts weight forward. ) focus on functional strength and balance.    PT Home Exercise Plan heels slides with stretching in both extension and flexion; patella mob, prone knee hang and prone knee flexion stretch, step down; 8/5 quad set. SAQ, terminal standing extension, functional squat and heel raises;  8/12 chair pose; 8/19:  LAQ with theraband, emphasised quad sets again  and bent knee raise for hip flexion strength.             Patient will benefit from skilled therapeutic intervention in order to improve the following deficits and impairments:  Pain, Abnormal gait, Increased edema, Decreased activity tolerance, Decreased mobility, Decreased balance, Decreased range of motion, Difficulty walking  Visit Diagnosis: Difficulty in walking, not elsewhere classified  Muscle weakness (generalized)  Chronic pain of left knee  Chronic pain of right knee     Problem List Patient Active Problem List   Diagnosis Date Noted   Atrial fibrillation ([place]) 04/01/2020   Encounter for therapeutic drug monitoring 04/01/2020   Acute blood loss anemia 02/09/2020   Atrial fibrillation with rapid ventricular response ([place]) 01/29/2020   Hypoxia 01/29/2020   Hypothyroidism 01/29/2020   OA (osteoarthritis) of knee 01/24/2020   Bilateral primary osteoarthritis of knee 01/24/2020   Seasonal and perennial allergic rhinitis 12/24/2019   Allergic contact dermatitis 12/24/2019   Mild intermittent asthma, uncomplicated [id number]   IBS (irritable bowel syndrome) 04/11/2019   History of colonic polyps 11/25/2016   Renal mass 08/21/2016   Upper airway cough syndrome 07/06/2014   Essential hypertension 07/06/2014   Personal history of colonic polyps 09/17/2010   BACK PAIN 09/14/2008  HIRSUTISM 06/15/2008   GLUCOSE INTOLERANCE 01/09/2008   NEOPLASM, MALIGNANT, UTERUS 12/27/2006   THYROID NODULE 12/27/2006   DIVERTICULAR DISEASE 06/03/2006   OBESITY NOS 03/09/2006   DEPRESSION 03/09/2006   ALLERGIC RHINITIS 03/09/2006   ASTHMA 03/09/2006   GERD 03/09/2006   ELEVATED BLOOD PRESSURE WITHOUT DIAGNOSIS OF HYPERTENSION 03/09/2006   2:47 PM, 09/18/20 Jerene Pitch, DPT Physical Therapy with [facility]  [phone] office   [place] [address] [place], [place], [address] Phone: [phone]    Fax:  [phone]  Name: BERETTA WETHERBY MRN: [id number] Date of Birth: 09-Jul-1950

## 2020-09-25 ENCOUNTER — Ambulatory Visit (INDEPENDENT_AMBULATORY_CARE_PROVIDER_SITE_OTHER): Payer: Medicare Other | Admitting: *Deleted

## 2020-09-25 ENCOUNTER — Other Ambulatory Visit: Payer: Self-pay

## 2020-09-25 ENCOUNTER — Ambulatory Visit (HOSPITAL_COMMUNITY): Payer: Medicare Other | Attending: Internal Medicine | Admitting: Physical Therapy

## 2020-09-25 DIAGNOSIS — M25562 Pain in left knee: Secondary | ICD-10-CM | POA: Diagnosis not present

## 2020-09-25 DIAGNOSIS — R262 Difficulty in walking, not elsewhere classified: Secondary | ICD-10-CM | POA: Insufficient documentation

## 2020-09-25 DIAGNOSIS — M25561 Pain in right knee: Secondary | ICD-10-CM | POA: Diagnosis not present

## 2020-09-25 DIAGNOSIS — G8929 Other chronic pain: Secondary | ICD-10-CM | POA: Insufficient documentation

## 2020-09-25 DIAGNOSIS — Z5181 Encounter for therapeutic drug level monitoring: Secondary | ICD-10-CM

## 2020-09-25 DIAGNOSIS — M6281 Muscle weakness (generalized): Secondary | ICD-10-CM | POA: Diagnosis not present

## 2020-09-25 DIAGNOSIS — I480 Paroxysmal atrial fibrillation: Secondary | ICD-10-CM | POA: Diagnosis not present

## 2020-09-25 LAB — POCT INR: INR: 3.2 — AB (ref 2.0–3.0)

## 2020-09-25 NOTE — Therapy (Signed)
[name], [place], [address] Phone: [phone]   Fax:  [phone]  Physical Therapy Treatment  Patient Details  Name: Melissa Velazquez MRN: [id number] Date of Birth: 04-16-1950 Referring Provider (PT): lawrence fusco, MD   Encounter Date: 09/25/2020   PT End of Session - 09/25/20 1039     Visit Number 8    Number of Visits 10    Date for PT Re-Evaluation 10/04/20    Authorization Type medicare primary adn AARP secondary - no Auth or VL    Progress Note Due on Visit 10    PT Start Time 1032    PT Stop Time 1112    PT Time Calculation (min) 40 min    Activity Tolerance Patient tolerated treatment well    Behavior During Therapy [facility] for tasks assessed/performed             Past Medical History:  Diagnosis Date   Allergic rhinitis    Anal fissure    Hx of    Anxiety    hx of   Arthritis    Asthma    none in last year    Chronic kidney disease    partial nephrectomy   Depression    hx of   Diabetes mellitus without complication ([place])    no meds   Diverticular disease    Dysrhythmia    palpitations   Eczema    Endometrial cancer ([place]) 2001   spread to appendix    Hemorrhoids    Hyperglycemia    Hypertension    Hypothyroidism    Kidney cancer, primary, with metastasis from kidney to other site [facility])    Obesity    Sebaceous cyst    Thyroid disease    Thyroid nodule    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    Past Surgical History:  Procedure Laterality Date   APPENDECTOMY  2008   BIOPSY  12/14/2016   Procedure: BIOPSY;  Surgeon: Rogene Houston, MD;  [facility];  Service: Endoscopy;;  colon   CHOLECYSTECTOMY  2008   COLONOSCOPY N/A 11/24/2012   Procedure: COLONOSCOPY;  Surgeon: Rogene Houston, MD;  [facility];  Service: Endoscopy;  Laterality: N/A;  830   COLONOSCOPY N/A 12/14/2016   Procedure: COLONOSCOPY;  Surgeon: Rogene Houston, MD;  [facility];  Service: Endoscopy;  Laterality: N/A;  12:00   POLYPECTOMY  12/14/2016   Procedure: POLYPECTOMY;  Surgeon: Rogene Houston, MD;  [facility];  Service: Endoscopy;;  colon   ROBOTIC ASSITED PARTIAL NEPHRECTOMY Left 08/21/2016   Procedure: XI ROBOTIC ASSITED PARTIAL NEPHRECTOMY;  Surgeon: Ardis Hughs, MD;  [facility];  Service: Urology;  Laterality: Left;   TOTAL ABDOMINAL HYSTERECTOMY  2001   TOTAL KNEE ARTHROPLASTY Bilateral 01/24/2020   Procedure: TOTAL KNEE BILATERAL;  Surgeon: Gaynelle Arabian, MD;  [facility];  Service: Orthopedics;  Laterality: Bilateral;   WISDOM TOOTH EXTRACTION      There were no vitals filed for this visit.   Subjective Assessment - 09/25/20 1037     Subjective Patient reports some pain in SI joint area today. States her knees are about a 2-3, but feels things are slowly improving.    Pertinent History afib, bilateral knee replacement    Limitations Walking;Standing;House hold activities    How long can you walk comfortably? able to walk for 30 mintutes    Patient Stated  Goals wants exercises to get up/down and to have motivation for her exercises    Currently in Pain? Yes    Pain Score 3     Pain Location Knee    Pain Orientation Right;Left    Pain Descriptors / Indicators Aching    Pain Type Chronic pain    Pain Onset More than a month ago                               [facility] Adult PT Treatment/Exercise - 09/25/20 0001       Knee/Hip Exercises: Aerobic   Stationary Bike 4 min dynamic warmup LV 2      Knee/Hip Exercises: Standing   Heel Raises Both;2 sets;10 reps    Forward Step Up Both;Step Height: 4";Hand Hold: 2;15 reps      Knee/Hip Exercises: Seated   Long Arc Quad Left;1 set;10 reps    Sit to [employer] 2 sets;10 reps;without UE support   eccentric lowering     Knee/Hip Exercises: Supine   Bridges 10 reps    Other Supine Knee/Hip Exercises iso hip abduction/ adduction 5 x 5"                   Upper Extremity Functional Index Score :   /80     PT Short Term Goals - 09/06/20 1020       PT SHORT TERM GOAL #1   Title Patient will be able to ambulate 2 minutes without any circumduction of right leg or left knee "acting funny" per patient.    Time 5    Period Weeks    Status On-going      PT SHORT TERM GOAL #2   Title Patient will demonstrate at least 8-115 degrees of knee ROM bilaterally    Time 5    Period Weeks    Status Achieved      PT SHORT TERM GOAL #3   Title Patient will be independent in self management strategies to improve quality of life and functional outcomes.    Time 5    Period Weeks    Status Achieved               PT Long Term Goals - 09/06/20 1112       PT LONG TERM GOAL #1   Title Patient will be able to go up and down steps with reciprocal gait pattern with railing as needed to improve ability to climb up and down steps    Baseline able to do, however pt states that she is holding on very tightly to the railing.; when therapist observes pt she is actually pulling herself up with the hand rails.    Time 10    Period Weeks    Status On-going      PT LONG TERM GOAL #2   Status Deferred   no foto taken     PT LONG TERM GOAL #3   Title Patient will report at least 50% improvement in overall symptoms and/or function to demonstrate improved functional mobility    Baseline pt fells that she is 32.5-40% improved    Time 10    Period Weeks    Status On-going      PT LONG TERM GOAL #4   Title Patient will increase gait speed to at least 1.2 m/s in order to be able to cross the street safely.    Baseline 1 m/s  Time 6    Period Weeks    Status Achieved                   Plan - 09/25/20 1557     Clinical Impression Statement Patient continues to demo LT quad weakness with functional activity, namely stair ambulation. Discussed importance of focus on quad strengthening and possible lumbar contribution to  weakness. Reviewed exercise from [facility] that focuses on quad specific strengthening and printed highlighted handout for patient. Patient will continue to benefit from LE and quad strength progressions to reduce knee pain and improve functional mobility.    Personal Factors and Comorbidities Comorbidity 1;Comorbidity 2    Comorbidities history of multiple cancers, bilateral TKA 01/24/20    Examination-Activity Limitations Squat;Stairs;Stand;Transfers;Locomotion Level;Lift;Carry;Bend;Bed Mobility    Examination-Participation Restrictions Meal Prep;Laundry;Community Activity;Cleaning    Stability/Clinical Decision Making Stable/Uncomplicated    Rehab Potential Fair    PT Frequency Other (comment)   total of 6 visits over 10 week certification with max of 1 visit/week   PT Duration Other (comment)   10 weeks   PT Treatment/Interventions ADLs/Self Care Home Management;Cryotherapy;Therapeutic exercise;Therapeutic activities;Manual techniques;Gait training;Neuromuscular re-education;Patient/family education;Dry needling    PT Next Visit Plan Total gym, follow up with bending knee to step up step (locks out and shifts weight forward. focus on functional strength and balance. Consider lumbar component if quad specific exercise yields no functional benefit.    PT Home Exercise Plan heels slides with stretching in both extension and flexion; patella mob, prone knee hang and prone knee flexion stretch, step down; 8/5 quad set. SAQ, terminal standing extension, functional squat and heel raises;  8/12 chair pose; 8/19:  LAQ with theraband, emphasised quad sets again and bent knee raise for hip flexion strength.             Patient will benefit from skilled therapeutic intervention in order to improve the following deficits and impairments:  Pain, Abnormal gait, Increased edema, Decreased activity tolerance, Decreased mobility, Decreased balance, Decreased range of motion, Difficulty walking  Visit  Diagnosis: Difficulty in walking, not elsewhere classified  Muscle weakness (generalized)  Chronic pain of left knee  Chronic pain of right knee     Problem List Patient Active Problem List   Diagnosis Date Noted   Atrial fibrillation ([place]) 04/01/2020   Encounter for therapeutic drug monitoring 04/01/2020   Acute blood loss anemia 02/09/2020   Atrial fibrillation with rapid ventricular response ([place]) 01/29/2020   Hypoxia 01/29/2020   Hypothyroidism 01/29/2020   OA (osteoarthritis) of knee 01/24/2020   Bilateral primary osteoarthritis of knee 01/24/2020   Seasonal and perennial allergic rhinitis 12/24/2019   Allergic contact dermatitis 12/24/2019   Mild intermittent asthma, uncomplicated [id number]   IBS (irritable bowel syndrome) 04/11/2019   History of colonic polyps 11/25/2016   Renal mass 08/21/2016   Upper airway cough syndrome 07/06/2014   Essential hypertension 07/06/2014   Personal history of colonic polyps 09/17/2010   BACK PAIN 09/14/2008   HIRSUTISM 06/15/2008   GLUCOSE INTOLERANCE 01/09/2008   NEOPLASM, MALIGNANT, UTERUS 12/27/2006   THYROID NODULE 12/27/2006   DIVERTICULAR DISEASE 06/03/2006   OBESITY NOS 03/09/2006   DEPRESSION 03/09/2006   ALLERGIC RHINITIS 03/09/2006   ASTHMA 03/09/2006   GERD 03/09/2006   ELEVATED BLOOD PRESSURE WITHOUT DIAGNOSIS OF HYPERTENSION 03/09/2006   4:02 PM, 09/25/20 Josue Hector PT DPT  Physical Therapist with [facility]  [phone] [place]  [place], [place], [address] Phone: [phone]   Fax:  [phone]  Name: MORGHAN HELF MRN: [id number] Date of Birth: 09/11/1950

## 2020-09-25 NOTE — Patient Instructions (Addendum)
Take warfarin 1 tablet tonight then decrease dose to 2 tablets daily except 1 1/2 tablets on Tuesdays and Fridays Recheck in 4 weeks

## 2020-09-25 NOTE — Patient Instructions (Signed)
Access Code: [facility] URL: [email or web address] Date: 09/25/2020 Prepared by: Josue Hector  Exercises Supine Quad Set - 2 x daily - 7 x weekly - 2 sets - 10 reps - 3 second hold Supine Bridge - 2 x daily - 7 x weekly - 2 sets - 10 reps - second hold Seated Long Arc Quad - 2 x daily - 7 x weekly - 2 sets - 10 reps - second hold Eccentric Squat - 2 x daily - 7 x weekly - 2 sets - 10 reps - second hold

## 2020-09-30 ENCOUNTER — Encounter (HOSPITAL_COMMUNITY): Payer: Self-pay | Admitting: Physical Therapy

## 2020-09-30 ENCOUNTER — Ambulatory Visit (HOSPITAL_COMMUNITY): Payer: Medicare Other | Admitting: Physical Therapy

## 2020-09-30 ENCOUNTER — Other Ambulatory Visit: Payer: Self-pay

## 2020-09-30 DIAGNOSIS — M6281 Muscle weakness (generalized): Secondary | ICD-10-CM | POA: Diagnosis not present

## 2020-09-30 DIAGNOSIS — M25561 Pain in right knee: Secondary | ICD-10-CM | POA: Diagnosis not present

## 2020-09-30 DIAGNOSIS — G8929 Other chronic pain: Secondary | ICD-10-CM

## 2020-09-30 DIAGNOSIS — M25562 Pain in left knee: Secondary | ICD-10-CM

## 2020-09-30 DIAGNOSIS — R262 Difficulty in walking, not elsewhere classified: Secondary | ICD-10-CM | POA: Diagnosis not present

## 2020-09-30 NOTE — Therapy (Signed)
[place], [place], [address] Phone: [phone]   Fax:  [phone]  Physical Therapy Treatment  Patient Details  Name: Melissa Velazquez MRN: [id number] Date of Birth: [birth year] Referring Provider (PT): lawrence fusco, MD   Encounter Date: 09/30/2020   PT End of Session - 09/30/20 1549     Visit Number 9    Number of Visits 10    Date for PT Re-Evaluation 10/04/20    Authorization Type medicare primary adn [employer] secondary - no Auth or VL    Progress Note Due on Visit 10    PT Start Time 1403    PT Stop Time 1443    PT Time Calculation (min) 40 min    Activity Tolerance Patient tolerated treatment well    Behavior During Therapy [facility] for tasks assessed/performed             Past Medical History:  Diagnosis Date   Allergic rhinitis    Anal fissure    Hx of    Anxiety    hx of   Arthritis    Asthma    none in last year    Chronic kidney disease    partial nephrectomy   Depression    hx of   Diabetes mellitus without complication ([place])    no meds   Diverticular disease    Dysrhythmia    palpitations   Eczema    Endometrial cancer ([place]) 2001   spread to appendix    Hemorrhoids    Hyperglycemia    Hypertension    Hypothyroidism    Kidney cancer, primary, with metastasis from kidney to other site [facility])    Obesity    Sebaceous cyst    Thyroid disease    Thyroid nodule    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    Past Surgical History:  Procedure Laterality Date   APPENDECTOMY  2008   BIOPSY  12/14/2016   Procedure: BIOPSY;  Surgeon: Rogene Houston, MD;  [facility];  Service: Endoscopy;;  colon   CHOLECYSTECTOMY  2008   COLONOSCOPY N/A 11/24/2012   Procedure: COLONOSCOPY;  Surgeon: Rogene Houston, MD;  [facility];  Service: Endoscopy;  Laterality: N/A;  830   COLONOSCOPY N/A 12/14/2016   Procedure: COLONOSCOPY;  Surgeon: Rogene Houston, MD;  [facility];  Service: Endoscopy;  Laterality: N/A;  12:00   POLYPECTOMY  12/14/2016   Procedure: POLYPECTOMY;  Surgeon: Rogene Houston, MD;  [facility];  Service: Endoscopy;;  colon   ROBOTIC ASSITED PARTIAL NEPHRECTOMY Left 08/21/2016   Procedure: XI ROBOTIC ASSITED PARTIAL NEPHRECTOMY;  Surgeon: Ardis Hughs, MD;  [facility]: [facility];  Service: Urology;  Laterality: Left;   TOTAL ABDOMINAL HYSTERECTOMY  2001   TOTAL KNEE ARTHROPLASTY Bilateral 01/24/2020   Procedure: TOTAL KNEE BILATERAL;  Surgeon: Gaynelle Arabian, MD;  [facility];  Service: Orthopedics;  Laterality: Bilateral;   WISDOM TOOTH EXTRACTION      There were no vitals filed for this visit.   Subjective Assessment - 09/30/20 1544     Subjective Patient states she is having some sciatic nerve pain. She has taken some acetaminophen and this was helpful. Knees are still somewhat aching, about a 2-3. Has done HEP exercise about 2 or 3 times since last visit due to back hurting.    Pertinent History afib, bilateral knee replacement    Limitations Walking;Standing;House hold activities  How long can you walk comfortably? able to walk for 30 mintutes    Patient Stated Goals wants exercises to get up/down and to have motivation for her exercises    Currently in Pain? Yes    Pain Score 3     Pain Location Knee    Pain Orientation Right;Left    Pain Descriptors / Indicators Aching    Pain Type Chronic pain    Pain Onset More than a month ago                           Adult Aquatic Therapy - 09/30/20 1717       Treatment   Gait dynamic warmup: pool walking 4 RT, sidestepping 4RT    Exercises heel raise 3 x 10, standing hip abduction 3 x 10, hip extension 3 x 10, knee drive strecthing at step 5 x 10" holds each, mini squat 3 x 10, tandem stance 2 x 30" each                           PT Short Term Goals - 09/06/20 1020       PT SHORT TERM GOAL #1   Title Patient will  be able to ambulate 2 minutes without any circumduction of right leg or left knee "acting funny" per patient.    Time 5    Period Weeks    Status On-going      PT SHORT TERM GOAL #2   Title Patient will demonstrate at least 8-115 degrees of knee ROM bilaterally    Time 5    Period Weeks    Status Achieved      PT SHORT TERM GOAL #3   Title Patient will be independent in self management strategies to improve quality of life and functional outcomes.    Time 5    Period Weeks    Status Achieved               PT Long Term Goals - 09/06/20 1112       PT LONG TERM GOAL #1   Title Patient will be able to go up and down steps with reciprocal gait pattern with railing as needed to improve ability to climb up and down steps    Baseline able to do, however pt states that she is holding on very tightly to the railing.; when therapist observes pt she is actually pulling herself up with the hand rails.    Time 10    Period Weeks    Status On-going      PT LONG TERM GOAL #2   Status Deferred   no foto taken     PT LONG TERM GOAL #3   Title Patient will report at least 50% improvement in overall symptoms and/or function to demonstrate improved functional mobility    Baseline pt fells that she is 32.5-40% improved    Time 10    Period Weeks    Status On-going      PT LONG TERM GOAL #4   Title Patient will increase gait speed to at least 1.2 m/s in order to be able to cross the street safely.    Baseline 1 m/s    Time 6    Period Weeks    Status Achieved                   Plan - 09/30/20 1550  Clinical Impression Statement Initiated aqua therapy. Patient tolerated session well overall today. Noting some discomfort in LT knee during standing hip abduction but improved with knee flexion stretching. Patient able to perform all other standing exercises and squatting with decreased pain in gravity minimized setting. Patient cued on proper knee and leg position during  squats. Will continue to progress activity as tolerated. Reassess next visit per end of cert.    Personal Factors and Comorbidities Comorbidity 1;Comorbidity 2    Comorbidities history of multiple cancers, bilateral TKA 01/24/20    Examination-Activity Limitations Squat;Stairs;Stand;Transfers;Locomotion Level;Lift;Carry;Bend;Bed Mobility    Examination-Participation Restrictions Meal Prep;Laundry;Community Activity;Cleaning    Stability/Clinical Decision Making Stable/Uncomplicated    Rehab Potential Fair    PT Frequency Other (comment)   total of 6 visits over 10 week certification with max of 1 visit/week   PT Duration Other (comment)   10 weeks   PT Treatment/Interventions ADLs/Self Care Home Management;Cryotherapy;Therapeutic exercise;Therapeutic activities;Manual techniques;Gait training;Neuromuscular re-education;Patient/family education;Dry needling    PT Next Visit Plan Total gym, follow up with bending knee to step up step (locks out and shifts weight forward. focus on functional strength and balance. Consider lumbar component if quad specific exercise yields no functional benefit.    PT Home Exercise Plan heels slides with stretching in both extension and flexion; patella mob, prone knee hang and prone knee flexion stretch, step down; 8/5 quad set. SAQ, terminal standing extension, functional squat and heel raises;  8/12 chair pose; 8/19:  LAQ with theraband, emphasised quad sets again and bent knee raise for hip flexion strength.    Consulted and Agree with Plan of Care Patient             Patient will benefit from skilled therapeutic intervention in order to improve the following deficits and impairments:  Pain, Abnormal gait, Increased edema, Decreased activity tolerance, Decreased mobility, Decreased balance, Decreased range of motion, Difficulty walking  Visit Diagnosis: Difficulty in walking, not elsewhere classified  Muscle weakness (generalized)  Chronic pain of left  knee  Chronic pain of right knee     Problem List Patient Active Problem List   Diagnosis Date Noted   Atrial fibrillation ([place]) 04/01/2020   Encounter for therapeutic drug monitoring 04/01/2020   Acute blood loss anemia 02/09/2020   Atrial fibrillation with rapid ventricular response ([place]) 01/29/2020   Hypoxia 01/29/2020   Hypothyroidism 01/29/2020   OA (osteoarthritis) of knee 01/24/2020   Bilateral primary osteoarthritis of knee 01/24/2020   Seasonal and perennial allergic rhinitis 12/24/2019   Allergic contact dermatitis 12/24/2019   Mild intermittent asthma, uncomplicated [id number]   IBS (irritable bowel syndrome) 04/11/2019   History of colonic polyps 11/25/2016   Renal mass 08/21/2016   Upper airway cough syndrome 07/06/2014   Essential hypertension 07/06/2014   Personal history of colonic polyps 09/17/2010   BACK PAIN 09/14/2008   HIRSUTISM 06/15/2008   GLUCOSE INTOLERANCE 01/09/2008   NEOPLASM, MALIGNANT, UTERUS 12/27/2006   THYROID NODULE 12/27/2006   DIVERTICULAR DISEASE 06/03/2006   OBESITY NOS 03/09/2006   DEPRESSION 03/09/2006   ALLERGIC RHINITIS 03/09/2006   ASTHMA 03/09/2006   GERD 03/09/2006   ELEVATED BLOOD PRESSURE WITHOUT DIAGNOSIS OF HYPERTENSION 03/09/2006   5:20 PM, 09/30/20 Josue Hector PT DPT  Physical Therapist with [facility]  [phone] [place] [address] [place], [place], [address] Phone: [phone]   Fax:  [phone]  Name: Melissa Velazquez MRN: [id number] Date of Birth: 07/30/1950

## 2020-10-04 ENCOUNTER — Encounter (HOSPITAL_COMMUNITY): Payer: Medicare Other | Admitting: Physical Therapy

## 2020-10-07 ENCOUNTER — Other Ambulatory Visit: Payer: Self-pay

## 2020-10-07 ENCOUNTER — Encounter (HOSPITAL_COMMUNITY): Payer: Self-pay | Admitting: Physical Therapy

## 2020-10-07 ENCOUNTER — Ambulatory Visit (HOSPITAL_COMMUNITY): Payer: Medicare Other | Admitting: Physical Therapy

## 2020-10-07 DIAGNOSIS — M6281 Muscle weakness (generalized): Secondary | ICD-10-CM

## 2020-10-07 DIAGNOSIS — G8929 Other chronic pain: Secondary | ICD-10-CM | POA: Diagnosis not present

## 2020-10-07 DIAGNOSIS — R262 Difficulty in walking, not elsewhere classified: Secondary | ICD-10-CM | POA: Diagnosis not present

## 2020-10-07 DIAGNOSIS — M25562 Pain in left knee: Secondary | ICD-10-CM | POA: Diagnosis not present

## 2020-10-07 DIAGNOSIS — M25561 Pain in right knee: Secondary | ICD-10-CM | POA: Diagnosis not present

## 2020-10-07 NOTE — Therapy (Signed)
[place] [address] [place], [place], [address] Phone: [phone]   Fax:  [phone]  Physical Therapy Treatment  Patient Details  Name: Melissa Velazquez MRN: [id number] Date of Birth: 01-Nov-1950 Referring Provider (PT): lawrence fusco, MD  PHYSICAL THERAPY DISCHARGE SUMMARY  Visits from Start of Care: 10  Current functional level related to goals / functional outcomes: See below    Remaining deficits: See below    Education / Equipment: See assessment    Patient agrees to discharge. Patient goals were partially met. Patient is being discharged due to lack of progress.   Encounter Date: 10/07/2020   PT End of Session - 10/07/20 1354     Visit Number 10    Number of Visits 10    Date for PT Re-Evaluation 10/07/20    Authorization Type medicare primary adn AARP secondary - no Auth or VL    Progress Note Due on Visit 10    PT Start Time 1346    PT Stop Time 1428    PT Time Calculation (min) 42 min    Activity Tolerance Patient tolerated treatment well    Behavior During Therapy W[facility] for tasks assessed/performed             Past Medical History:  Diagnosis Date   Allergic rhinitis    Anal fissure    Hx of    Anxiety    hx of   Arthritis    Asthma    none in last year    Chronic kidney disease    partial nephrectomy   Depression    hx of   Diabetes mellitus without complication ([place])    no meds   Diverticular disease    Dysrhythmia    palpitations   Eczema    Endometrial cancer ([place]) 2001   spread to appendix    Hemorrhoids    Hyperglycemia    Hypertension    Hypothyroidism    Kidney cancer, primary, with metastasis from kidney to other site [facility])    Obesity    Sebaceous cyst    Thyroid disease    Thyroid nodule    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    Past Surgical History:  Procedure Laterality Date   APPENDECTOMY  2008   BIOPSY  12/14/2016   Procedure: BIOPSY;  Surgeon: Rogene Houston, MD;  [facility];  Service: Endoscopy;;  colon   CHOLECYSTECTOMY  2008   COLONOSCOPY N/A 11/24/2012   Procedure: COLONOSCOPY;  Surgeon: Rogene Houston, MD;  [facility];  Service: Endoscopy;  Laterality: N/A;  830   COLONOSCOPY N/A 12/14/2016   Procedure: COLONOSCOPY;  Surgeon: Rogene Houston, MD;  [facility];  Service: Endoscopy;  Laterality: N/A;  12:00   POLYPECTOMY  12/14/2016   Procedure: POLYPECTOMY;  Surgeon: Rogene Houston, MD;  [facility];  Service: Endoscopy;;  colon   ROBOTIC ASSITED PARTIAL NEPHRECTOMY Left 08/21/2016   Procedure: XI ROBOTIC ASSITED PARTIAL NEPHRECTOMY;  Surgeon: Ardis Hughs, MD;  [facility];  Service: Urology;  Laterality: Left;   TOTAL ABDOMINAL HYSTERECTOMY  2001   TOTAL KNEE ARTHROPLASTY Bilateral 01/24/2020   Procedure: TOTAL KNEE BILATERAL;  Surgeon: Gaynelle Arabian, MD;  [facility];  Service: Orthopedics;  Laterality: Bilateral;   WISDOM TOOTH EXTRACTION      There were no vitals filed for this visit.   Subjective Assessment - 10/07/20 1354  Subjective Patient reports about 50% improvement since starting therapy. She does note some ongoing deficits relative to weakness but voices that its possible she has not been active enough with HEP.    Pertinent History afib, bilateral knee replacement    Limitations Walking;Standing;House hold activities    How long can you walk comfortably? able to walk for 30 mintutes    Patient Stated Goals wants exercises to get up/down and to have motivation for her exercises    Currently in Pain? Yes    Pain Score 3     Pain Location Knee    Pain Orientation Right;Left    Pain Descriptors / Indicators Aching    Pain Type Chronic pain    Pain Onset More than a month ago    Pain Frequency Intermittent                [facility] PT Assessment - 10/07/20 0001       Assessment   Medical Diagnosis hx of bilateral knee replacement     Referring Provider (PT) lawrence fusco, MD    Onset Date/Surgical Date 01/24/20      AROM   Right Knee Extension 4    Right Knee Flexion 115    Left Knee Extension 1    Left Knee Flexion 115      Strength   Right Knee Extension 5/5    Left Knee Extension 4+/5   in available ROM (demos extension lag at end range seated)     Ambulation/Gait   Ambulation/Gait Yes    Ambulation Distance (Feet) 475 Feet    Assistive device None    Gait Pattern Step-through pattern    Ambulation Surface Level;Indoor    Stairs Yes    Stairs Assistance 6: Modified independent (Device/Increase time)    Stair Management Technique One rail Left;Step to pattern    Number of Stairs 8    Height of Stairs 7    Gait Comments 2MW                                      PT Short Term Goals - 10/07/20 1412       PT SHORT TERM GOAL #1   Title Patient will be able to ambulate 2 minutes without any circumduction of right leg or left knee "acting funny" per patient.    Baseline See ambulation    Time 5    Period Weeks    Status Achieved      PT SHORT TERM GOAL #2   Title Patient will demonstrate at least 8-115 degrees of knee ROM bilaterally    Baseline See AROM    Time 5    Period Weeks    Status Achieved      PT SHORT TERM GOAL #3   Title Patient will be independent in self management strategies to improve quality of life and functional outcomes.    Time 5    Period Weeks    Status Achieved               PT Long Term Goals - 10/07/20 1412       PT LONG TERM GOAL #1   Title Patient will be able to go up and down steps with reciprocal gait pattern with railing as needed to improve ability to climb up and down steps    Baseline See stairs comment    Time 10  Period Weeks    Status Not Met      PT LONG TERM GOAL #2   Status Deferred   no foto taken     PT LONG TERM GOAL #3   Title Patient will report at least 50% improvement in overall symptoms and/or function  to demonstrate improved functional mobility    Baseline Reports 50% improved    Time 10    Period Weeks    Status On-going      PT LONG TERM GOAL #4   Title Patient will increase gait speed to at least 1.2 m/s in order to be able to cross the street safely.    Baseline 1.206 m/s current    Time 6    Period Weeks    Status Achieved                   Plan - 10/07/20 1429     Clinical Impression Statement Patient shows minimal change in function since last reassessment. She is showing improved knee ROM, as well as gait mechanics and speed. Patient remains limited by functional weakness about LLE. This continues to impact her ability to ambulate stairs efficiently. Patient admits she has not been compliant with HEP. Reiterated importance of HEP and exercise specific to improving knee extensor strength for improved functional mobility on stairs. At this point, patient will be DC to HEP to continue working strength exercise and possible return to therapy at a later date if able to breach current plateaus with improved adherence to home program. Patient encouraged to follow up with therapy service a needed.    Personal Factors and Comorbidities Comorbidity 1;Comorbidity 2    Comorbidities history of multiple cancers, bilateral TKA 01/24/20    Examination-Activity Limitations Squat;Stairs;Stand;Transfers;Locomotion Level;Lift;Carry;Bend;Bed Mobility    Examination-Participation Restrictions Meal Prep;Laundry;Community Activity;Cleaning    Stability/Clinical Decision Making Stable/Uncomplicated    Rehab Potential Fair    PT Frequency --   total of 6 visits over 10 week certification with max of 1 visit/week   PT Duration --   10 weeks   PT Treatment/Interventions ADLs/Self Care Home Management;Cryotherapy;Therapeutic exercise;Therapeutic activities;Manual techniques;Gait training;Neuromuscular re-education;Patient/family education;Dry needling    PT Next Visit Plan DC to HEP    PT Home  Exercise Plan heels slides with stretching in both extension and flexion; patella mob, prone knee hang and prone knee flexion stretch, step down; 8/5 quad set. SAQ, terminal standing extension, functional squat and heel raises;  8/12 chair pose; 8/19:  LAQ with theraband, emphasised quad sets again and bent knee raise for hip flexion strength.    Consulted and Agree with Plan of Care Patient             Patient will benefit from skilled therapeutic intervention in order to improve the following deficits and impairments:  Pain, Abnormal gait, Increased edema, Decreased activity tolerance, Decreased mobility, Decreased balance, Decreased range of motion, Difficulty walking  Visit Diagnosis: Difficulty in walking, not elsewhere classified  Muscle weakness (generalized)  Chronic pain of left knee  Chronic pain of right knee     Problem List Patient Active Problem List   Diagnosis Date Noted   Atrial fibrillation ([place]) 04/01/2020   Encounter for therapeutic drug monitoring 04/01/2020   Acute blood loss anemia 02/09/2020   Atrial fibrillation with rapid ventricular response ([place]) 01/29/2020   Hypoxia 01/29/2020   Hypothyroidism 01/29/2020   OA (osteoarthritis) of knee 01/24/2020   Bilateral primary osteoarthritis of knee 01/24/2020   Seasonal and perennial allergic  rhinitis 12/24/2019   Allergic contact dermatitis 12/24/2019   Mild intermittent asthma, uncomplicated 00/76/2263   IBS (irritable bowel syndrome) 04/11/2019   History of colonic polyps 11/25/2016   Renal mass 08/21/2016   Upper airway cough syndrome 07/06/2014   Essential hypertension 07/06/2014   Personal history of colonic polyps 09/17/2010   BACK PAIN 09/14/2008   HIRSUTISM 06/15/2008   GLUCOSE INTOLERANCE 01/09/2008   NEOPLASM, MALIGNANT, UTERUS 12/27/2006   THYROID NODULE 12/27/2006   DIVERTICULAR DISEASE 06/03/2006   OBESITY NOS 03/09/2006   DEPRESSION 03/09/2006   ALLERGIC RHINITIS 03/09/2006   ASTHMA  03/09/2006   GERD 03/09/2006   ELEVATED BLOOD PRESSURE WITHOUT DIAGNOSIS OF HYPERTENSION 03/09/2006   2:31 PM, 10/07/20 Josue Hector PT DPT  Physical Therapist with [facility]  [phone] [place] [address] [place], [place], [address] Phone: [phone]   Fax:  [phone]  Name: Melissa Velazquez MRN: [id number] Date of Birth: 1950-04-18

## 2020-10-16 ENCOUNTER — Ambulatory Visit: Payer: Medicare Other | Admitting: Cardiology

## 2020-10-23 ENCOUNTER — Ambulatory Visit (INDEPENDENT_AMBULATORY_CARE_PROVIDER_SITE_OTHER): Payer: Medicare Other | Admitting: Cardiology

## 2020-10-23 ENCOUNTER — Ambulatory Visit (INDEPENDENT_AMBULATORY_CARE_PROVIDER_SITE_OTHER): Payer: Medicare Other | Admitting: *Deleted

## 2020-10-23 ENCOUNTER — Encounter: Payer: Self-pay | Admitting: Cardiology

## 2020-10-23 ENCOUNTER — Encounter: Payer: Self-pay | Admitting: *Deleted

## 2020-10-23 VITALS — BP 124/80 | HR 56 | Ht 64.0 in | Wt 208.2 lb

## 2020-10-23 DIAGNOSIS — Z5181 Encounter for therapeutic drug level monitoring: Secondary | ICD-10-CM

## 2020-10-23 DIAGNOSIS — I480 Paroxysmal atrial fibrillation: Secondary | ICD-10-CM

## 2020-10-23 LAB — POCT INR: INR: 3.6 — AB (ref 2.0–3.0)

## 2020-10-23 NOTE — Patient Instructions (Signed)
Hold warfarin tonight then decrease dose to 1 1/2 tablets daily except 2 tablets on Mondays and Fridays Recheck in 3 weeks

## 2020-10-23 NOTE — Patient Instructions (Signed)

## 2020-10-23 NOTE — Progress Notes (Signed)
Clinical Summary Melissa Velazquez is a 70 y.o.female seen today for follow up of the following medical problems.    1. PAF - She underwent bilateral total knee replacements in 01/2020.  Her post op course was c/b AFib w RVR. Jan 29 2020 EKG review confirms afib - wanted to chagne from NOAC to coumadin due to cost.    - occasional palpitations, occurs few times week. Lasts just a few seconds.  - uses prn metoprolol.  - no bleeding on coumadin.    Works as Engineer, water, has phD.   Past Medical History:  Diagnosis Date   Allergic rhinitis    Anal fissure    Hx of    Anxiety    hx of   Arthritis    Asthma    none in last year    Chronic kidney disease    partial nephrectomy   Depression    hx of   Diabetes mellitus without complication (HCC)    no meds   Diverticular disease    Dysrhythmia    palpitations   Eczema    Endometrial cancer (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     albuterol (PROVENTIL HFA;VENTOLIN HFA) 108 (90 Base) MCG/ACT inhaler Inhale 2 puffs into the lungs every 6 (six) hours as needed for wheezing or shortness of breath.     cetirizine (ZYRTEC) 10 MG tablet Take 10 mg by mouth daily as needed for allergies.     diclofenac Sodium (VOLTAREN) 1 % GEL Apply 4 g topically 4 (four)  times daily. 350 g 0   dicyclomine (BENTYL) 20 MG tablet Take 1 tablet (20 mg total) by mouth 2 (two) times daily. 10 tablet 0   fluticasone (FLONASE) 50 MCG/ACT nasal spray Place 1 spray into both nostrils 2 (two) times daily as needed for allergies or rhinitis. 16 g 5   levothyroxine (SYNTHROID, LEVOTHROID) 75 MCG tablet Take 75 mcg by mouth daily before breakfast.     metoprolol tartrate (LOPRESSOR) 25 MG tablet Take 0.5 tablets (12.5 mg total) by mouth as needed (for palpitations). 45 tablet 3   ondansetron (ZOFRAN) 4 MG tablet Take 1 tablet (4 mg total) by mouth every 6 (six) hours. 12 tablet 0   sertraline (ZOLOFT) 100 MG tablet Take 150 mg by mouth daily with breakfast.     triamcinolone cream (KENALOG) 0.1 % Apply 1 application topically daily as needed (rash).     warfarin (COUMADIN) 5 MG tablet Take 1 1/2 tablets daily except 2 tablets on Tuesdays, Thursdays and Saturdays or as directed 180 tablet 1   No current facility-administered medications for this visit.     Past Surgical  History:  Procedure Laterality Date   APPENDECTOMY  2008   BIOPSY  12/14/2016   Procedure: BIOPSY;  Surgeon: Rogene Houston, MD;  [facility];  Service: Endoscopy;;  colon   CHOLECYSTECTOMY  2008   COLONOSCOPY N/A 11/24/2012   Procedure: COLONOSCOPY;  Surgeon: Rogene Houston, MD;  [facility];  Service: Endoscopy;  Laterality: N/A;  830   COLONOSCOPY N/A 12/14/2016   Procedure: COLONOSCOPY;  Surgeon: Rogene Houston, MD;  [facility];  Service: Endoscopy;  Laterality: N/A;  12:00   POLYPECTOMY  12/14/2016   Procedure: POLYPECTOMY;  Surgeon: Rogene Houston, MD;  [facility];  Service: Endoscopy;;  colon   ROBOTIC ASSITED PARTIAL NEPHRECTOMY Left 08/21/2016   Procedure: XI ROBOTIC ASSITED PARTIAL NEPHRECTOMY;  Surgeon: Ardis Hughs, MD;  [facility];  Service: Urology;  Laterality: Left;   TOTAL ABDOMINAL HYSTERECTOMY  2001   TOTAL KNEE  ARTHROPLASTY Bilateral 01/24/2020   Procedure: TOTAL KNEE BILATERAL;  Surgeon: Gaynelle Arabian, MD;  [facility];  Service: Orthopedics;  Laterality: Bilateral;   WISDOM TOOTH EXTRACTION       Allergies  Allergen Reactions   Triple Antibiotic [Bacitracin-Neomycin-Polymyxin] Anaphylaxis   Levofloxacin Other (See Comments)    Stomach upset   Oxycodone     Tremors and brain fog   Capsaicin Rash   Elastic Bandages & [Zinc] Rash   Nickel Itching and Rash      Family History  Problem Relation Age of Onset   Breast cancer Mother    Diabetes Mother    Obesity Sister    Asthma Sister    Eczema Sister    COPD Father        smoked   Prostate cancer Father    Breast cancer Maternal Aunt    Urticaria Maternal Grandfather    Colon cancer Neg Hx      Social History Melissa Velazquez reports that she quit smoking about 22 years ago. Her smoking use included cigarettes. She has a 20.00 pack-year smoking history. She has never used smokeless tobacco. Melissa Velazquez reports current alcohol use.   Review of Systems CONSTITUTIONAL: No weight loss, fever, chills, weakness or fatigue.  HEENT: Eyes: No visual loss, blurred vision, double vision or yellow sclerae.No hearing loss, sneezing, congestion, runny nose or sore throat.  SKIN: No rash or itching.  CARDIOVASCULAR: per hpi RESPIRATORY: No shortness of breath, cough or sputum.  GASTROINTESTINAL: No anorexia, nausea, vomiting or diarrhea. No abdominal pain or blood.  GENITOURINARY: No burning on urination, no polyuria NEUROLOGICAL: No headache, dizziness, syncope, paralysis, ataxia, numbness or tingling in the extremities. No change in bowel or bladder control.  MUSCULOSKELETAL: No muscle, back pain, joint pain or stiffness.  LYMPHATICS: No enlarged nodes. No history of splenectomy.  PSYCHIATRIC: No history of depression or anxiety.  ENDOCRINOLOGIC: No reports of sweating, cold or heat intolerance. No polyuria or polydipsia.   Marland Kitchen   Physical Examination Today's Vitals   10/23/20 0937  BP: 124/80  Pulse: (!) 56  SpO2: 97%  Weight: 208 lb 3.2 oz (94.4 kg)  Height: 5\' 4"  (1.626 m)   Body mass index is 35.74 kg/m.  Gen: resting comfortably, no acute distress HEENT: no scleral icterus, pupils equal round and reactive, no palptable cervical adenopathy,  CV: RRR, no m/r/g no jvd Resp: Clear to auscultation bilaterally GI: abdomen is soft, non-tender, non-distended, normal bowel sounds, no hepatosplenomegaly MSK: extremities are warm, no edema.  Skin: warm,  no rash Neuro:  no focal deficits Psych: appropriate affect   Diagnostic Studies Jan 2022 echo   IMPRESSIONS     1. Left ventricular ejection fraction, by estimation, is 60 to 65%. The  left ventricle has normal function. The left ventricle has no regional  wall motion abnormalities. There is mild concentric left ventricular  hypertrophy and moderate basal septal  hypertrophy.   2. Right ventricular systolic function is mildly reduced. The right  ventricular size is normal. There is moderately elevated pulmonary artery  systolic pressure. The estimated right ventricular systolic pressure is  35.5 mmHg.   3. Large pleural effusion in the left lateral region.   4. The mitral valve is normal in structure. No evidence of mitral valve  regurgitation. No evidence of mitral stenosis.   5. The aortic valve is tricuspid. Aortic valve regurgitation is not  visualized. Mild aortic valve sclerosis is present, with no evidence of  aortic valve stenosis. Aortic valve area, by VTI measures 2.41 cm. Aortic  valve mean gradient measures 4.0 mmHg.   Aortic valve Vmax measures 1.51 m/s.   6. The inferior vena cava is dilated in size with <50% respiratory  variability, suggesting right atrial pressure of 15 mmHg.   7. Tricuspid valve regurgitation is mild to moderate.     Assessment and Plan   1. PAF - fairly new diagnosis during Jan 2022 admission -  NOACs were expensive, has transitioned to coumadin. - no recent symptoms, continue current meds     Arnoldo Lenis, M.D.

## 2020-10-24 ENCOUNTER — Encounter (HOSPITAL_COMMUNITY): Payer: Medicare Other | Admitting: Physical Therapy

## 2020-11-01 DIAGNOSIS — Z23 Encounter for immunization: Secondary | ICD-10-CM | POA: Diagnosis not present

## 2020-11-14 ENCOUNTER — Other Ambulatory Visit: Payer: Self-pay

## 2020-11-14 ENCOUNTER — Ambulatory Visit (INDEPENDENT_AMBULATORY_CARE_PROVIDER_SITE_OTHER): Payer: Medicare Other | Admitting: *Deleted

## 2020-11-14 DIAGNOSIS — I480 Paroxysmal atrial fibrillation: Secondary | ICD-10-CM | POA: Diagnosis not present

## 2020-11-14 DIAGNOSIS — Z5181 Encounter for therapeutic drug level monitoring: Secondary | ICD-10-CM | POA: Diagnosis not present

## 2020-11-14 LAB — POCT INR: INR: 1.6 — AB (ref 2.0–3.0)

## 2020-11-14 NOTE — Patient Instructions (Signed)
Description    -Take 2 tablets of warfarin today -Tomorrow take 2.5 tablets on warfarin -Then continue to take warfarin 1.5 tablets daily except for 2 tablets on Mondays and Fridays.  -Recheck INR in 2 weeks.

## 2020-11-27 ENCOUNTER — Other Ambulatory Visit: Payer: Self-pay | Admitting: Urology

## 2020-11-27 ENCOUNTER — Other Ambulatory Visit (HOSPITAL_COMMUNITY): Payer: Self-pay | Admitting: Urology

## 2020-11-27 DIAGNOSIS — C642 Malignant neoplasm of left kidney, except renal pelvis: Secondary | ICD-10-CM

## 2020-11-28 ENCOUNTER — Ambulatory Visit (INDEPENDENT_AMBULATORY_CARE_PROVIDER_SITE_OTHER): Payer: Medicare Other | Admitting: *Deleted

## 2020-11-28 ENCOUNTER — Other Ambulatory Visit: Payer: Self-pay

## 2020-11-28 DIAGNOSIS — Z5181 Encounter for therapeutic drug level monitoring: Secondary | ICD-10-CM | POA: Diagnosis not present

## 2020-11-28 DIAGNOSIS — I480 Paroxysmal atrial fibrillation: Secondary | ICD-10-CM | POA: Diagnosis not present

## 2020-11-28 LAB — POCT INR: INR: 2.4 (ref 2.0–3.0)

## 2020-11-28 NOTE — Patient Instructions (Signed)
Continue warfarin 1.5 tablets daily except for 2 tablets on Mondays and Fridays.  -Recheck INR in 4 weeks.

## 2020-12-02 DIAGNOSIS — E6609 Other obesity due to excess calories: Secondary | ICD-10-CM | POA: Diagnosis not present

## 2020-12-02 DIAGNOSIS — M1991 Primary osteoarthritis, unspecified site: Secondary | ICD-10-CM | POA: Diagnosis not present

## 2020-12-02 DIAGNOSIS — E559 Vitamin D deficiency, unspecified: Secondary | ICD-10-CM | POA: Diagnosis not present

## 2020-12-02 DIAGNOSIS — Z1331 Encounter for screening for depression: Secondary | ICD-10-CM | POA: Diagnosis not present

## 2020-12-02 DIAGNOSIS — Z0000 Encounter for general adult medical examination without abnormal findings: Secondary | ICD-10-CM | POA: Diagnosis not present

## 2020-12-02 DIAGNOSIS — E063 Autoimmune thyroiditis: Secondary | ICD-10-CM | POA: Diagnosis not present

## 2020-12-02 DIAGNOSIS — I10 Essential (primary) hypertension: Secondary | ICD-10-CM | POA: Diagnosis not present

## 2020-12-02 DIAGNOSIS — E039 Hypothyroidism, unspecified: Secondary | ICD-10-CM | POA: Diagnosis not present

## 2020-12-02 DIAGNOSIS — R7309 Other abnormal glucose: Secondary | ICD-10-CM | POA: Diagnosis not present

## 2020-12-02 DIAGNOSIS — I4891 Unspecified atrial fibrillation: Secondary | ICD-10-CM | POA: Diagnosis not present

## 2020-12-02 DIAGNOSIS — Z13220 Encounter for screening for lipoid disorders: Secondary | ICD-10-CM | POA: Diagnosis not present

## 2020-12-02 DIAGNOSIS — Q681 Congenital deformity of finger(s) and hand: Secondary | ICD-10-CM | POA: Diagnosis not present

## 2020-12-02 DIAGNOSIS — Z6836 Body mass index (BMI) 36.0-36.9, adult: Secondary | ICD-10-CM | POA: Diagnosis not present

## 2020-12-26 ENCOUNTER — Ambulatory Visit (INDEPENDENT_AMBULATORY_CARE_PROVIDER_SITE_OTHER): Payer: Medicare Other | Admitting: *Deleted

## 2020-12-26 ENCOUNTER — Other Ambulatory Visit: Payer: Self-pay

## 2020-12-26 DIAGNOSIS — I480 Paroxysmal atrial fibrillation: Secondary | ICD-10-CM | POA: Diagnosis not present

## 2020-12-26 DIAGNOSIS — Z5181 Encounter for therapeutic drug level monitoring: Secondary | ICD-10-CM

## 2020-12-26 LAB — POCT INR: INR: 3 (ref 2.0–3.0)

## 2020-12-26 NOTE — Patient Instructions (Signed)
Continue warfarin 1.5 tablets daily except for 2 tablets on Mondays and Fridays.  -Recheck INR in 4 weeks.

## 2021-01-06 ENCOUNTER — Ambulatory Visit (HOSPITAL_COMMUNITY)
Admission: RE | Admit: 2021-01-06 | Discharge: 2021-01-06 | Disposition: A | Discharge status: Home / Self Care | Payer: Medicare Other | Source: Ambulatory Visit | Attending: Urology | Admitting: Urology

## 2021-01-06 ENCOUNTER — Other Ambulatory Visit: Payer: Self-pay

## 2021-01-06 ENCOUNTER — Other Ambulatory Visit (HOSPITAL_COMMUNITY): Payer: Self-pay | Admitting: Urology

## 2021-01-06 DIAGNOSIS — C642 Malignant neoplasm of left kidney, except renal pelvis: Secondary | ICD-10-CM

## 2021-01-06 DIAGNOSIS — K5730 Diverticulosis of large intestine without perforation or abscess without bleeding: Secondary | ICD-10-CM | POA: Diagnosis not present

## 2021-01-06 DIAGNOSIS — R079 Chest pain, unspecified: Secondary | ICD-10-CM | POA: Diagnosis not present

## 2021-01-06 DIAGNOSIS — R0602 Shortness of breath: Secondary | ICD-10-CM | POA: Diagnosis not present

## 2021-01-06 DIAGNOSIS — R059 Cough, unspecified: Secondary | ICD-10-CM | POA: Diagnosis not present

## 2021-01-06 DIAGNOSIS — K529 Noninfective gastroenteritis and colitis, unspecified: Secondary | ICD-10-CM | POA: Diagnosis not present

## 2021-01-06 DIAGNOSIS — M160 Bilateral primary osteoarthritis of hip: Secondary | ICD-10-CM | POA: Diagnosis not present

## 2021-01-06 LAB — POCT I-STAT CREATININE: Creatinine, Ser: 1 mg/dL (ref 0.44–1.00)

## 2021-01-06 MED ORDER — IOHEXOL 300 MG/ML  SOLN
100.0000 mL | Freq: Once | INTRAMUSCULAR | Status: AC | PRN
  Administered 2021-01-06: 10:00:00 100 mL via INTRAVENOUS

## 2021-01-23 ENCOUNTER — Ambulatory Visit (INDEPENDENT_AMBULATORY_CARE_PROVIDER_SITE_OTHER): Payer: Medicare Other | Admitting: *Deleted

## 2021-01-23 ENCOUNTER — Other Ambulatory Visit: Payer: Self-pay

## 2021-01-23 DIAGNOSIS — I480 Paroxysmal atrial fibrillation: Secondary | ICD-10-CM | POA: Diagnosis not present

## 2021-01-23 DIAGNOSIS — C642 Malignant neoplasm of left kidney, except renal pelvis: Secondary | ICD-10-CM | POA: Diagnosis not present

## 2021-01-23 DIAGNOSIS — Z5181 Encounter for therapeutic drug level monitoring: Secondary | ICD-10-CM | POA: Diagnosis not present

## 2021-01-23 LAB — POCT INR: INR: 4.2 — AB (ref 2.0–3.0)

## 2021-01-23 NOTE — Patient Instructions (Signed)
Hold warfarin tonight, take 1 tablet tomorrow night then resume 1.5 tablets daily except for 2 tablets on Mondays and Fridays.  Took diflucan 1 tablet last Thursday -Recheck INR in 3 weeks.

## 2021-01-29 ENCOUNTER — Other Ambulatory Visit (INDEPENDENT_AMBULATORY_CARE_PROVIDER_SITE_OTHER): Payer: Self-pay | Admitting: Internal Medicine

## 2021-01-30 NOTE — Telephone Encounter (Signed)
Last seen 04/16/20 - ibs -d

## 2021-02-08 ENCOUNTER — Other Ambulatory Visit: Payer: Self-pay | Admitting: Cardiology

## 2021-02-12 ENCOUNTER — Ambulatory Visit (INDEPENDENT_AMBULATORY_CARE_PROVIDER_SITE_OTHER): Payer: Medicare Other | Admitting: *Deleted

## 2021-02-12 DIAGNOSIS — I480 Paroxysmal atrial fibrillation: Secondary | ICD-10-CM

## 2021-02-12 DIAGNOSIS — Z5181 Encounter for therapeutic drug level monitoring: Secondary | ICD-10-CM | POA: Diagnosis not present

## 2021-02-12 LAB — POCT INR: INR: 1.7 — AB (ref 2.0–3.0)

## 2021-02-12 NOTE — Patient Instructions (Signed)
Take warfarin 2 tablets tonight and tomorrow night then resume 1.5 tablets daily except for 2 tablets on Mondays and Fridays.  Took diflucan 1 tablet last week.  Skipped 1/2 tablet of warfarin -Recheck INR in 3 weeks.

## 2021-03-07 ENCOUNTER — Ambulatory Visit (INDEPENDENT_AMBULATORY_CARE_PROVIDER_SITE_OTHER): Payer: Medicare Other | Admitting: *Deleted

## 2021-03-07 ENCOUNTER — Other Ambulatory Visit: Payer: Self-pay

## 2021-03-07 DIAGNOSIS — Z5181 Encounter for therapeutic drug level monitoring: Secondary | ICD-10-CM | POA: Diagnosis not present

## 2021-03-07 DIAGNOSIS — I480 Paroxysmal atrial fibrillation: Secondary | ICD-10-CM

## 2021-03-07 LAB — POCT INR: INR: 2.3 (ref 2.0–3.0)

## 2021-03-07 NOTE — Patient Instructions (Signed)
Continue warfarin 1.5 tablets daily except for 2 tablets on Mondays and Fridays.  -Recheck INR in 4 weeks.

## 2021-03-12 ENCOUNTER — Other Ambulatory Visit: Payer: Self-pay | Admitting: Urology

## 2021-03-12 DIAGNOSIS — C642 Malignant neoplasm of left kidney, except renal pelvis: Secondary | ICD-10-CM

## 2021-03-28 DIAGNOSIS — I4891 Unspecified atrial fibrillation: Secondary | ICD-10-CM | POA: Diagnosis not present

## 2021-03-28 DIAGNOSIS — E559 Vitamin D deficiency, unspecified: Secondary | ICD-10-CM | POA: Diagnosis not present

## 2021-03-28 DIAGNOSIS — E6609 Other obesity due to excess calories: Secondary | ICD-10-CM | POA: Diagnosis not present

## 2021-03-28 DIAGNOSIS — Z6836 Body mass index (BMI) 36.0-36.9, adult: Secondary | ICD-10-CM | POA: Diagnosis not present

## 2021-03-28 DIAGNOSIS — B379 Candidiasis, unspecified: Secondary | ICD-10-CM | POA: Diagnosis not present

## 2021-04-03 ENCOUNTER — Ambulatory Visit (INDEPENDENT_AMBULATORY_CARE_PROVIDER_SITE_OTHER): Payer: Medicare Other | Admitting: *Deleted

## 2021-04-03 DIAGNOSIS — Z5181 Encounter for therapeutic drug level monitoring: Secondary | ICD-10-CM | POA: Diagnosis not present

## 2021-04-03 DIAGNOSIS — I480 Paroxysmal atrial fibrillation: Secondary | ICD-10-CM | POA: Diagnosis not present

## 2021-04-03 LAB — POCT INR: INR: 3.9 — AB (ref 2.0–3.0)

## 2021-04-03 NOTE — Patient Instructions (Signed)
Hold warfarin tonight then take 1 1/2 tablets daily except for 1 tablet on Monday and Friday next week while taking Diflucan then resume 1 1/2 tablets daily except 2 tablets on Mondays and Fridays.  ?-Recheck INR in 2 weeks.  ?

## 2021-04-17 ENCOUNTER — Ambulatory Visit (INDEPENDENT_AMBULATORY_CARE_PROVIDER_SITE_OTHER): Payer: Medicare Other | Admitting: *Deleted

## 2021-04-17 DIAGNOSIS — Z5181 Encounter for therapeutic drug level monitoring: Secondary | ICD-10-CM | POA: Diagnosis not present

## 2021-04-17 DIAGNOSIS — I480 Paroxysmal atrial fibrillation: Secondary | ICD-10-CM | POA: Diagnosis not present

## 2021-04-17 LAB — POCT INR: INR: 3.6 — AB (ref 2.0–3.0)

## 2021-04-17 NOTE — Patient Instructions (Signed)
Hold warfarin tonight then decrease dose to 1 1/2 tablets daily except 1 tablet on Sundays.  ?-Recheck INR in 3 weeks.  ?Takes diflucan when needed ?

## 2021-04-20 DIAGNOSIS — Z20822 Contact with and (suspected) exposure to covid-19: Secondary | ICD-10-CM | POA: Diagnosis not present

## 2021-04-25 ENCOUNTER — Ambulatory Visit
Admission: RE | Admit: 2021-04-25 | Discharge: 2021-04-25 | Disposition: A | Discharge status: Home / Self Care | Payer: Medicare Other | Source: Ambulatory Visit | Attending: Urology | Admitting: Urology

## 2021-04-25 DIAGNOSIS — C642 Malignant neoplasm of left kidney, except renal pelvis: Secondary | ICD-10-CM

## 2021-04-25 DIAGNOSIS — Z905 Acquired absence of kidney: Secondary | ICD-10-CM | POA: Diagnosis not present

## 2021-04-25 MED ORDER — GADOBENATE DIMEGLUMINE 529 MG/ML IV SOLN
19.0000 mL | Freq: Once | INTRAVENOUS | Status: AC | PRN
  Administered 2021-04-25: 19 mL via INTRAVENOUS

## 2021-05-05 DIAGNOSIS — C642 Malignant neoplasm of left kidney, except renal pelvis: Secondary | ICD-10-CM | POA: Diagnosis not present

## 2021-05-08 ENCOUNTER — Ambulatory Visit (INDEPENDENT_AMBULATORY_CARE_PROVIDER_SITE_OTHER): Payer: Medicare Other | Admitting: *Deleted

## 2021-05-08 DIAGNOSIS — I480 Paroxysmal atrial fibrillation: Secondary | ICD-10-CM

## 2021-05-08 DIAGNOSIS — Z5181 Encounter for therapeutic drug level monitoring: Secondary | ICD-10-CM

## 2021-05-08 LAB — POCT INR: INR: 2.4 (ref 2.0–3.0)

## 2021-05-08 NOTE — Patient Instructions (Signed)
Continue warfarin 1 1/2 tablets daily except 1 tablet on Sundays.  ?-Recheck INR in 4 weeks.  ?Takes diflucan when needed ?

## 2021-05-16 ENCOUNTER — Ambulatory Visit (INDEPENDENT_AMBULATORY_CARE_PROVIDER_SITE_OTHER): Payer: Medicare Other | Admitting: Cardiology

## 2021-05-16 ENCOUNTER — Encounter: Payer: Self-pay | Admitting: Cardiology

## 2021-05-16 VITALS — BP 122/80 | HR 70 | Ht 64.0 in | Wt 211.4 lb

## 2021-05-16 DIAGNOSIS — I480 Paroxysmal atrial fibrillation: Secondary | ICD-10-CM

## 2021-05-16 NOTE — Patient Instructions (Signed)

## 2021-05-16 NOTE — Progress Notes (Signed)
? ? ? ?Clinical Summary ?Melissa Velazquez is a 71 y.o.female seen today for follow up of the following medical problems.  ?  ?1. PAF ?- She underwent bilateral total knee replacements in 01/2020.  Her post op course was c/b AFib w RVR. Jan 29 2020 EKG review confirms afib ?- wanted to chagne from NOAC to coumadin due to cost.  ? ? ?- no recent palpitations ?- compliant with meds. Has takedn prn lopressor just twice.  ?- compliant with coumadin,  ? ?2.Kidney cancer ?- renal cell carcinoma ?- followed by Dr Louis Meckel with urology.  ? ? ? ? ?  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 ?[place] ? ? ? ?Past Medical History:  ?Diagnosis Date  ? Allergic rhinitis   ? Anal fissure   ? Hx of   ? Anxiety   ? hx of  ? Arthritis   ? Asthma   ? none in last year   ? Chronic kidney disease   ? partial nephrectomy  ? Depression   ? hx of  ? Diabetes mellitus without complication ([place])   ? no meds  ? Diverticular disease   ? Dysrhythmia   ? palpitations  ? Eczema   ? Endometrial cancer ([place]) 2001  ? spread to appendix   ? Hemorrhoids   ? Hyperglycemia   ? Hypertension   ? Hypothyroidism   ? Kidney cancer, primary, with metastasis from kidney to other site [facility])   ? Obesity   ? Sebaceous cyst   ? Thyroid disease   ? Thyroid nodule   ? 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 ? ? ?Allergies  ?Allergen Reactions  ? Triple Antibiotic [Bacitracin-Neomycin-Polymyxin] Anaphylaxis  ? Levofloxacin Other (See Comments)  ?  Stomach upset  ? Oxycodone   ?  Tremors and brain fog  ? Capsaicin Rash  ? Elastic Bandages & [Zinc] Rash  ? Nickel Itching and Rash  ? ? ? ?Current Outpatient Medications  ?Medication Sig Dispense Refill  ? acetaminophen (TYLENOL) 500 MG tablet Take 500 mg by mouth every 6 (six) hours as needed.    ? albuterol (PROVENTIL HFA;VENTOLIN HFA) 108 (90 Base) MCG/ACT inhaler Inhale 2 puffs into the lungs every 6 (six) hours as needed for wheezing or shortness of  breath.    ? cetirizine (ZYRTEC) 10 MG tablet Take 10 mg by mouth daily as needed for allergies.    ? diclofenac Sodium (VOLTAREN) 1 % GEL Apply 4 g topically 4 (four) times daily. 350 g 0  ? dicyclomine (BENTYL) 20 MG tablet TAKE 1 TABLET TWICE DAILY 120 tablet 2  ? fluticasone (FLONASE) 50 MCG/ACT nasal spray Place 1 spray into both nostrils 2 (two) times daily as needed for allergies or rhinitis. 16 g 5  ? levothyroxine (SYNTHROID, LEVOTHROID) 75 MCG tablet Take 75 mcg by mouth daily before breakfast.    ? metoprolol tartrate (LOPRESSOR) 25 MG tablet Take 0.5 tablets (12.5 mg total) by mouth as needed (for palpitations). 45 tablet 3  ? sertraline (ZOLOFT) 100 MG tablet Take 150 mg by mouth daily with breakfast.    ? triamcinolone cream (KENALOG) 0.1 % Apply 1 application topically daily as needed (rash).    ? warfarin (COUMADIN) 5 MG tablet Take 1 1/2 tablets daily except 2 tablets on Mondays and Fridays or as directed 180 tablet 1  ? ?No current facility-administered medications for this visit.  ? ? ? ?Past  Surgical History:  ?Procedure Laterality Date  ? APPENDECTOMY  2008  ? BIOPSY  12/14/2016  ? Procedure: BIOPSY;  Surgeon: Rogene Houston, MD;  [facility];  Service: Endoscopy;;  colon  ? CHOLECYSTECTOMY  2008  ? COLONOSCOPY N/A 11/24/2012  ? Procedure: COLONOSCOPY;  Surgeon: Rogene Houston, MD;  [facility];  Service: Endoscopy;  Laterality: N/A;  830  ? COLONOSCOPY N/A 12/14/2016  ? Procedure: COLONOSCOPY;  Surgeon: Rogene Houston, MD;  [facility];  Service: Endoscopy;  Laterality: N/A;  12:00  ? POLYPECTOMY  12/14/2016  ? Procedure: POLYPECTOMY;  Surgeon: Rogene Houston, MD;  [facility];  Service: Endoscopy;;  colon  ? ROBOTIC ASSITED PARTIAL NEPHRECTOMY Left 08/21/2016  ? Procedure: XI ROBOTIC ASSITED PARTIAL NEPHRECTOMY;  Surgeon: Ardis Hughs, MD;  [facility];  Service: Urology;  Laterality: Left;  ? TOTAL ABDOMINAL HYSTERECTOMY  2001  ?  TOTAL KNEE ARTHROPLASTY Bilateral 01/24/2020  ? Procedure: TOTAL KNEE BILATERAL;  Surgeon: Gaynelle Arabian, MD;  [facility];  Service: Orthopedics;  Laterality: Bilateral;  ? WISDOM TOOTH EXTRACTION    ? ? ? ?Allergies  ?Allergen Reactions  ? Triple Antibiotic [Bacitracin-Neomycin-Polymyxin] Anaphylaxis  ? Levofloxacin Other (See Comments)  ?  Stomach upset  ? Oxycodone   ?  Tremors and brain fog  ? Capsaicin Rash  ? Elastic Bandages & [Zinc] Rash  ? Nickel Itching and Rash  ? ? ? ? ?Family History  ?Problem Relation Age of Onset  ? Breast cancer Mother   ? Diabetes Mother   ? Obesity Sister   ? Asthma Sister   ? Eczema Sister   ? COPD Father   ?     smoked  ? Prostate cancer Father   ? Breast cancer Maternal Aunt   ? Urticaria Maternal Grandfather   ? Colon cancer Neg Hx   ? ? ? ?Social History ?Ms. Peasley reports that she quit smoking about 23 years ago. Her smoking use included cigarettes. She has a 20.00 pack-year smoking history. She has never used smokeless tobacco. ?Ms. Filice reports current alcohol use. ? ? ?Review of Systems ?CONSTITUTIONAL: No weight loss, fever, chills, weakness or fatigue.  ?HEENT: Eyes: No visual loss, blurred vision, double vision or yellow sclerae.No hearing loss, sneezing, congestion, runny nose or sore throat.  ?SKIN: No rash or itching.  ?CARDIOVASCULAR: per hpi ?RESPIRATORY: No shortness of breath, cough or sputum.  ?GASTROINTESTINAL: No anorexia, nausea, vomiting or diarrhea. No abdominal pain or blood.  ?GENITOURINARY: No burning on urination, no polyuria ?NEUROLOGICAL: No headache, dizziness, syncope, paralysis, ataxia, numbness or tingling in the extremities. No change in bowel or bladder control.  ?MUSCULOSKELETAL: No muscle, back pain, joint pain or stiffness.  ?LYMPHATICS: No enlarged nodes. No history of splenectomy.  ?PSYCHIATRIC: No history of depression or anxiety.  ?ENDOCRINOLOGIC: No reports of sweating, cold or heat intolerance. No polyuria or polydipsia.   ?. ? ? ?Physical Examination ?Today's Vitals  ? 05/16/21 1104  ?BP: 122/80  ?Pulse: 70  ?SpO2: 98%  ?Weight: 211 lb 6.4 oz (95.9 kg)  ?Height: '5\' 4"'$  (1.626 m)  ? ?Body mass index is 36.29 kg/m?. ? ?Gen: resting comfortably, no acute distress ?HEENT: no scleral icterus, pupils equal round and reactive, no palptable cervical adenopathy,  ?CV: RRR, no m/r/g no jvd ?Resp: Clear to auscultation bilaterally ?GI: abdomen is soft, non-tender, non-distended, normal bowel sounds, no hepatosplenomegaly ?MSK: extremities are warm, no edema.  ?Skin: warm,  no rash ?Neuro:  no focal deficits ?Psych: appropriate affect ? ? ?Diagnostic Studies ? ?Jan 2022 echo ?  ?IMPRESSIONS  ? ? ? 1. Left ventricular ejection fraction, by estimation, is 60 to 65%. The  ?left ventricle has normal function. The left ventricle has no regional  ?wall motion abnormalities. There is mild concentric left ventricular  ?hypertrophy and moderate basal septal  ?hypertrophy.  ? 2. Right ventricular systolic function is mildly reduced. The right  ?ventricular size is normal. There is moderately elevated pulmonary artery  ?systolic pressure. The estimated right ventricular systolic pressure is  ?68.3 mmHg.  ? 3. Large pleural effusion in the left lateral region.  ? 4. The mitral valve is normal in structure. No evidence of mitral valve  ?regurgitation. No evidence of mitral stenosis.  ? 5. The aortic valve is tricuspid. Aortic valve regurgitation is not  ?visualized. Mild aortic valve sclerosis is present, with no evidence of  ?aortic valve stenosis. Aortic valve area, by VTI measures 2.41 cm?Marland Kitchen Aortic  ?valve mean gradient measures 4.0 mmHg.  ? Aortic valve Vmax measures 1.51 m/s.  ? 6. The inferior vena cava is dilated in size with <50% respiratory  ?variability, suggesting right atrial pressure of 15 mmHg.  ? 7. Tricuspid valve regurgitation is mild to moderate. ? ? ?Assessment and Plan  ?1. PAF ?- fairly new diagnosis during Jan 2022 admission ?- NOACs were  expensive, has transitioned to coumadin.We checked again on the price of eliquis for her, high deductible at this time, continue coumadin ?-ekg today shows ectopic atrial rhythm rate 70 ?- continue current m

## 2021-05-19 DIAGNOSIS — Z20822 Contact with and (suspected) exposure to covid-19: Secondary | ICD-10-CM | POA: Diagnosis not present

## 2021-05-21 IMAGING — CT CT ABD-PEL WO/W CM
2 of 13 series · 10 of 46 positions shown, 16 images · IV contrast (Omnipaque or Isovue)
Comparison: 10/25/2018

CLINICAL DATA: Follow-up left renal cell carcinoma. Previous
partial left nephrectomy. Personal history of endometrial carcinoma.

EXAM:
CT ABDOMEN AND PELVIS WITHOUT AND WITH CONTRAST
TECHNIQUE: Multidetector CT imaging of the abdomen and pelvis was performed
following the standard protocol before and following the bolus
administration of intravenous contrast.
CONTRAST:  100mL OMNIPAQUE IOHEXOL 300 MG/ML  SOLN

[Series 4: coronal pre · coronal · non-contrast · 0.41mm/px · 2 of 93 slices shown, 3 images]
[im 31/93  soft-tissue]
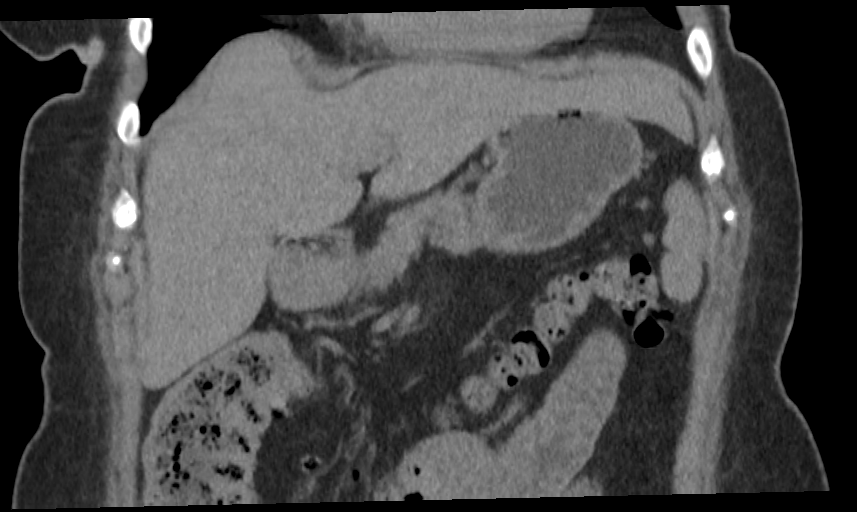
[im 31/93  bone]
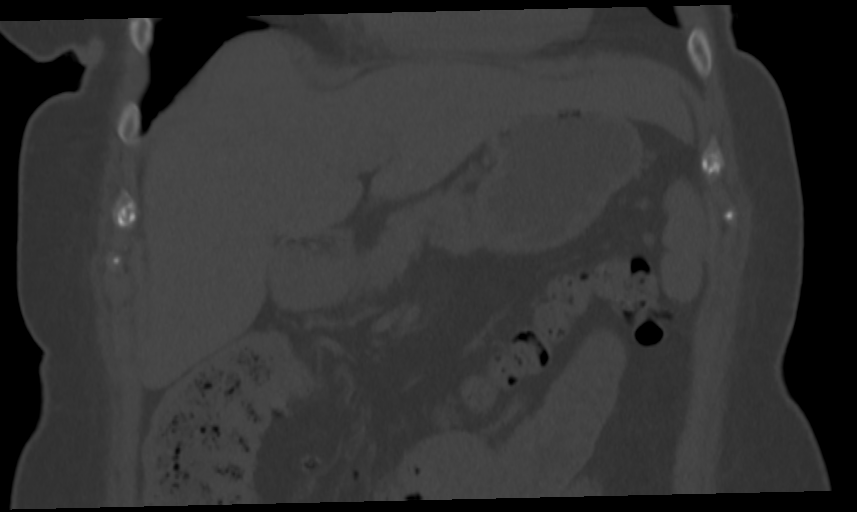
[im 62/93  soft-tissue]
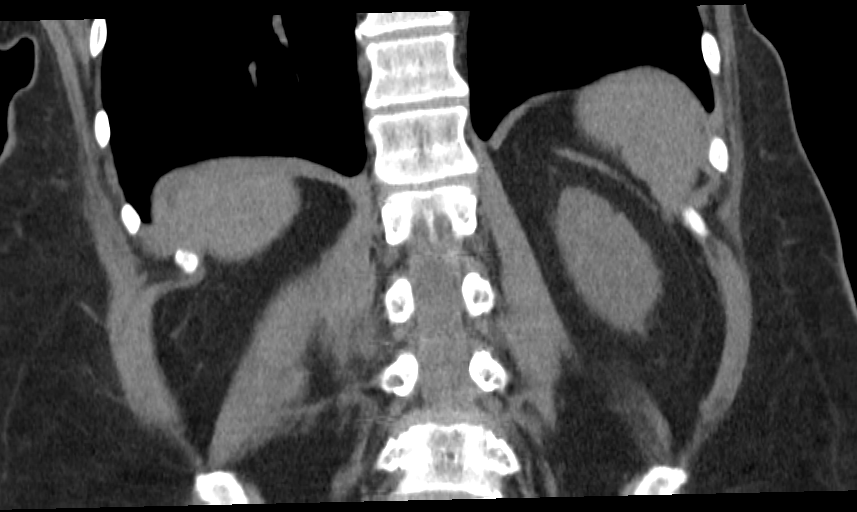

[Series 11: axial nephro · axial · 0.71mm/px · z∈[+937,+1261]mm · 8 of 140 slices shown, 13 images]
[im 16/140  soft-tissue]
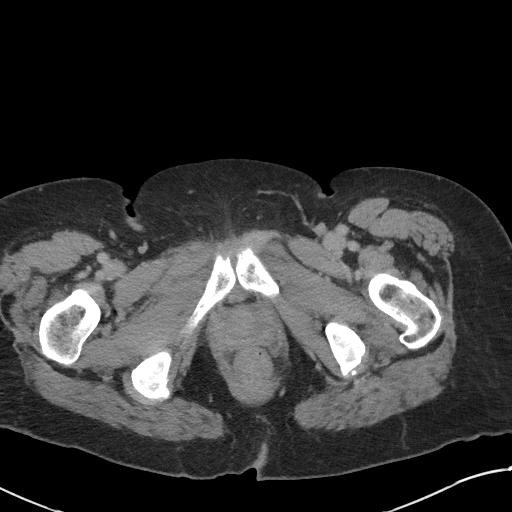
[im 16/140  bone]
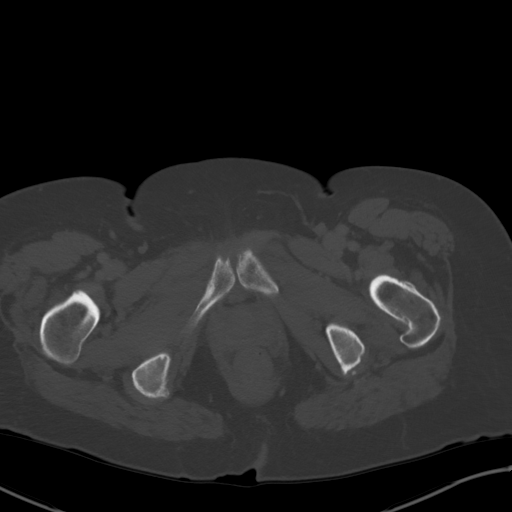
[im 31/140  soft-tissue]
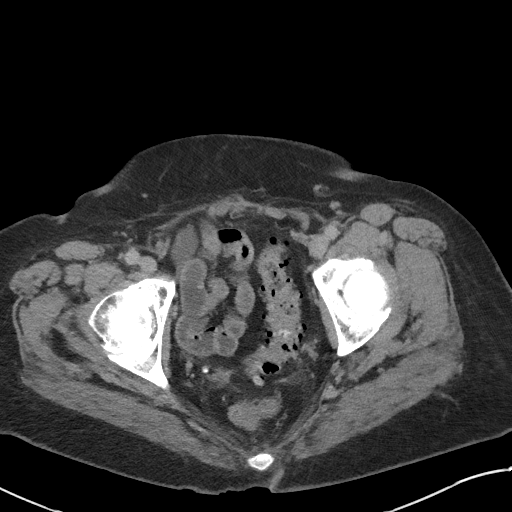
[im 47/140  soft-tissue]
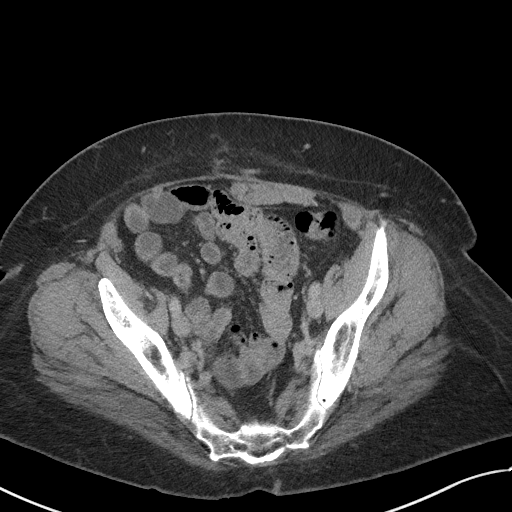
[im 62/140  soft-tissue]
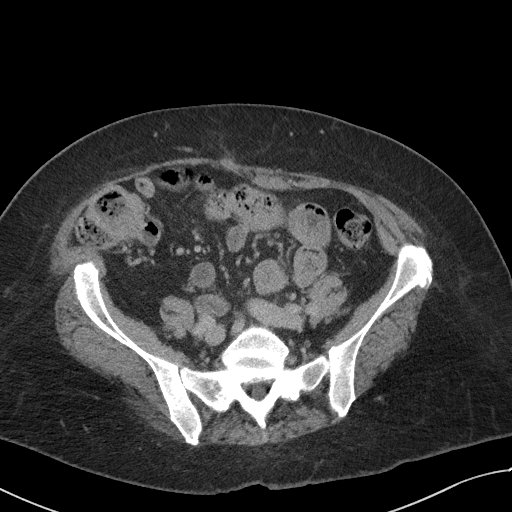
[im 78/140  soft-tissue]
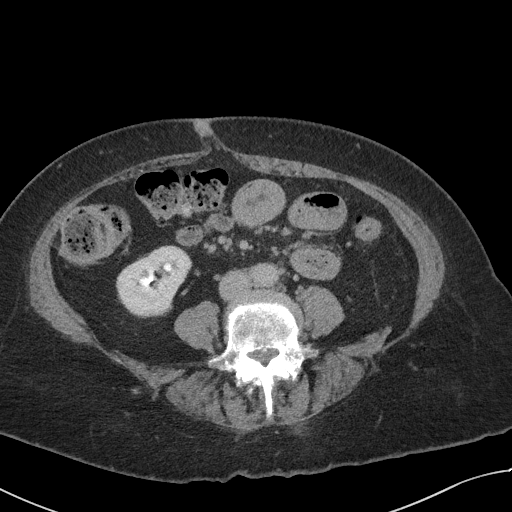
[im 78/140  lung]
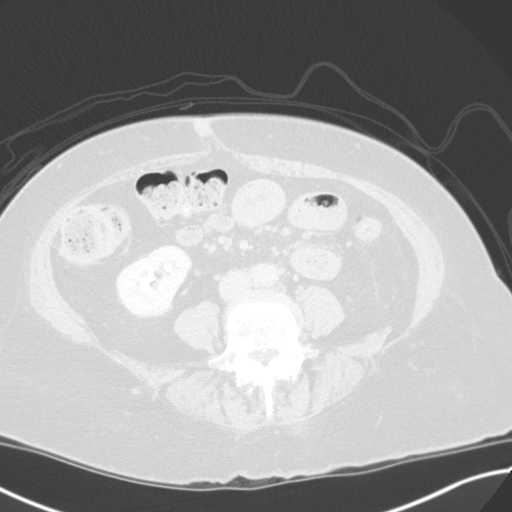
[im 93/140  soft-tissue]
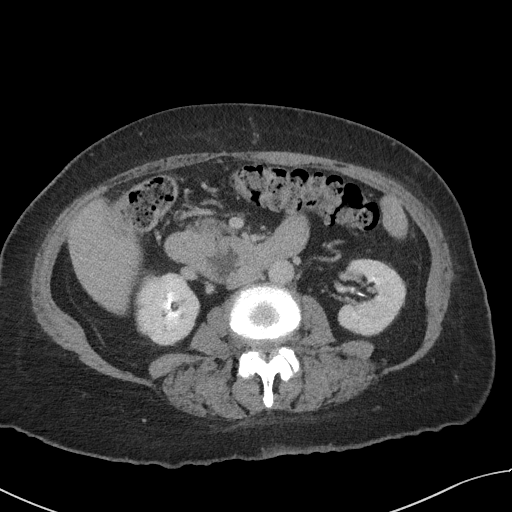
[im 93/140  lung]
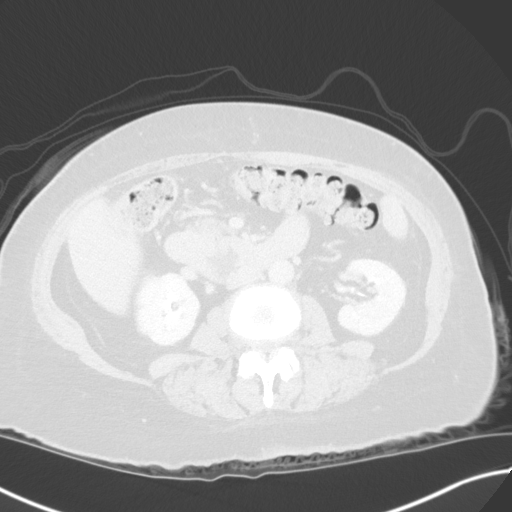
[im 109/140  soft-tissue]
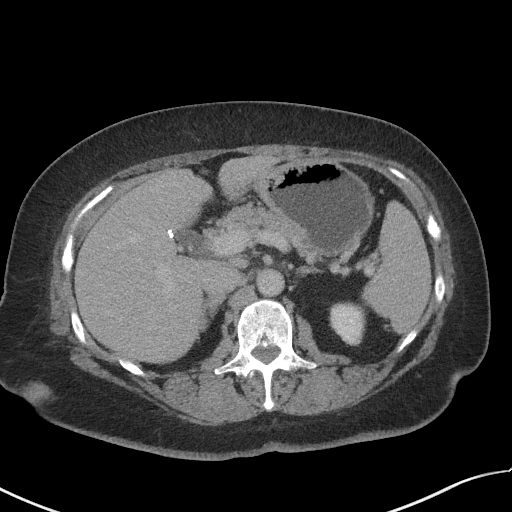
[im 109/140  lung]
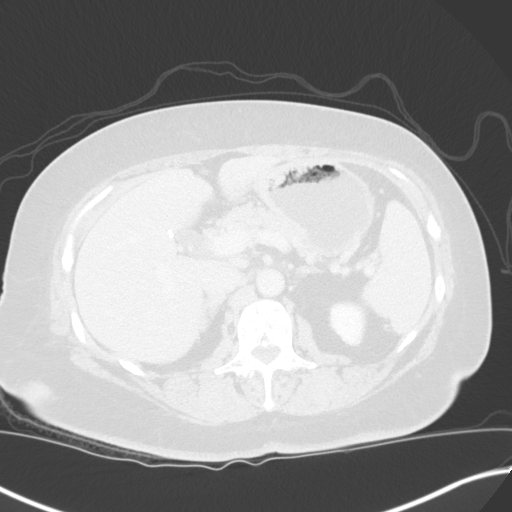
[im 124/140  soft-tissue]
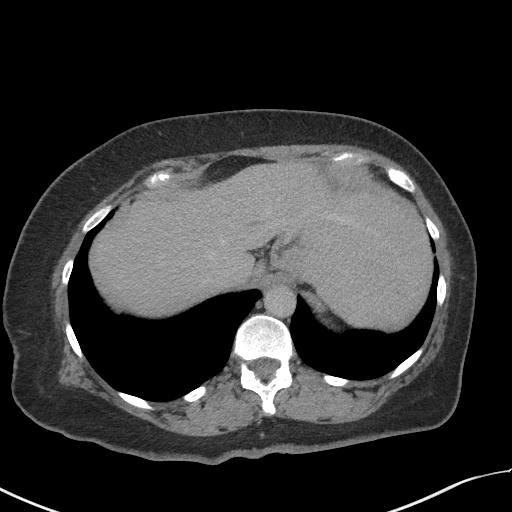
[im 124/140  lung]
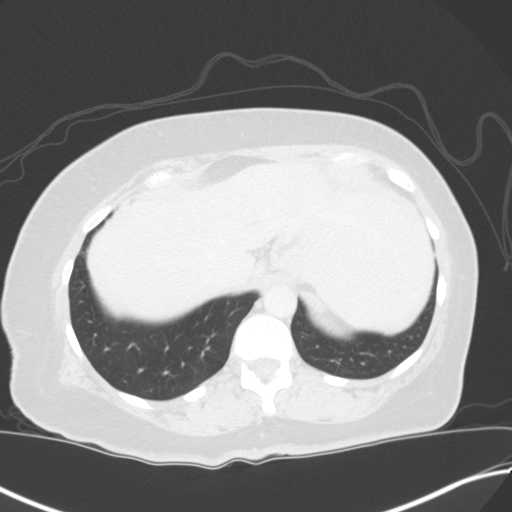

[10 of 46 positions shown; findings below may reference images not displayed]

FINDINGS: Lower Chest: No acute findings.

Hepatobiliary: No hepatic masses identified. Prior cholecystectomy.
No evidence of biliary obstruction.

Pancreas:  No mass or inflammatory changes.

Spleen: Within normal limits in size and appearance.

Adrenals/Urinary Tract: Stable postop changes from partial left
nephrectomy involving the lower pole. No evidence of locally
recurrent mass. No other renal masses are identified. No evidence
urolithiasis or hydronephrosis. Unremarkable unopacified urinary
bladder.

Stomach/Bowel: No evidence of obstruction, inflammatory process or
abnormal fluid collections. Diffuse colonic diverticulosis is noted,
however there is no evidence of diverticulitis.

Vascular/Lymphatic: No pathologically enlarged lymph nodes. No
abdominal aortic aneurysm.

Reproductive: Prior hysterectomy noted. Adnexal regions are
unremarkable in appearance.

Other:  None.

Musculoskeletal:  No suspicious bone lesions identified.
IMPRESSION: Stable postop changes from partial left nephrectomy and
hysterectomy. No evidence of recurrent or metastatic carcinoma
within the abdomen or pelvis.

Colonic diverticulosis. No radiographic evidence of diverticulitis
or other acute findings.

## 2021-05-22 ENCOUNTER — Other Ambulatory Visit: Payer: Self-pay | Admitting: Urology

## 2021-05-22 DIAGNOSIS — N2889 Other specified disorders of kidney and ureter: Secondary | ICD-10-CM

## 2021-05-26 DIAGNOSIS — K625 Hemorrhage of anus and rectum: Secondary | ICD-10-CM | POA: Diagnosis not present

## 2021-05-26 DIAGNOSIS — E039 Hypothyroidism, unspecified: Secondary | ICD-10-CM | POA: Diagnosis not present

## 2021-05-26 DIAGNOSIS — E6609 Other obesity due to excess calories: Secondary | ICD-10-CM | POA: Diagnosis not present

## 2021-05-26 DIAGNOSIS — M5030 Other cervical disc degeneration, unspecified cervical region: Secondary | ICD-10-CM | POA: Diagnosis not present

## 2021-05-26 DIAGNOSIS — Z6835 Body mass index (BMI) 35.0-35.9, adult: Secondary | ICD-10-CM | POA: Diagnosis not present

## 2021-05-27 ENCOUNTER — Other Ambulatory Visit (HOSPITAL_COMMUNITY): Payer: Self-pay | Admitting: Family Medicine

## 2021-05-27 ENCOUNTER — Other Ambulatory Visit: Payer: Self-pay | Admitting: Family Medicine

## 2021-05-27 DIAGNOSIS — M5030 Other cervical disc degeneration, unspecified cervical region: Secondary | ICD-10-CM

## 2021-05-29 ENCOUNTER — Encounter (INDEPENDENT_AMBULATORY_CARE_PROVIDER_SITE_OTHER): Payer: Self-pay | Admitting: *Deleted

## 2021-05-30 DIAGNOSIS — I480 Paroxysmal atrial fibrillation: Secondary | ICD-10-CM

## 2021-06-02 DIAGNOSIS — L814 Other melanin hyperpigmentation: Secondary | ICD-10-CM | POA: Diagnosis not present

## 2021-06-02 DIAGNOSIS — D1801 Hemangioma of skin and subcutaneous tissue: Secondary | ICD-10-CM | POA: Diagnosis not present

## 2021-06-02 DIAGNOSIS — L821 Other seborrheic keratosis: Secondary | ICD-10-CM | POA: Diagnosis not present

## 2021-06-02 DIAGNOSIS — D225 Melanocytic nevi of trunk: Secondary | ICD-10-CM | POA: Diagnosis not present

## 2021-06-02 DIAGNOSIS — S20461A Insect bite (nonvenomous) of right back wall of thorax, initial encounter: Secondary | ICD-10-CM | POA: Diagnosis not present

## 2021-06-02 DIAGNOSIS — L720 Epidermal cyst: Secondary | ICD-10-CM | POA: Diagnosis not present

## 2021-06-02 DIAGNOSIS — D2239 Melanocytic nevi of other parts of face: Secondary | ICD-10-CM | POA: Diagnosis not present

## 2021-06-05 ENCOUNTER — Ambulatory Visit (INDEPENDENT_AMBULATORY_CARE_PROVIDER_SITE_OTHER): Payer: Medicare Other | Admitting: *Deleted

## 2021-06-05 DIAGNOSIS — I4891 Unspecified atrial fibrillation: Secondary | ICD-10-CM

## 2021-06-05 DIAGNOSIS — I480 Paroxysmal atrial fibrillation: Secondary | ICD-10-CM

## 2021-06-05 DIAGNOSIS — Z5181 Encounter for therapeutic drug level monitoring: Secondary | ICD-10-CM | POA: Diagnosis not present

## 2021-06-05 LAB — POCT INR: INR: 1.8 — AB (ref 2.0–3.0)

## 2021-06-05 NOTE — Patient Instructions (Signed)
Increase dose to 1 1/2 tablets daily except 2 tablets on Thursdays -Recheck INR in 3 weeks.  Takes diflucan when needed

## 2021-06-12 ENCOUNTER — Telehealth: Payer: Self-pay | Admitting: *Deleted

## 2021-06-12 ENCOUNTER — Encounter (INDEPENDENT_AMBULATORY_CARE_PROVIDER_SITE_OTHER): Payer: Self-pay | Admitting: Gastroenterology

## 2021-06-12 ENCOUNTER — Encounter (INDEPENDENT_AMBULATORY_CARE_PROVIDER_SITE_OTHER): Payer: Self-pay

## 2021-06-12 ENCOUNTER — Telehealth (INDEPENDENT_AMBULATORY_CARE_PROVIDER_SITE_OTHER): Payer: Self-pay

## 2021-06-12 ENCOUNTER — Ambulatory Visit (INDEPENDENT_AMBULATORY_CARE_PROVIDER_SITE_OTHER): Payer: Medicare Other | Admitting: Gastroenterology

## 2021-06-12 VITALS — BP 109/61 | HR 71 | Temp 98.3°F | Ht 64.0 in | Wt 213.8 lb

## 2021-06-12 DIAGNOSIS — K625 Hemorrhage of anus and rectum: Secondary | ICD-10-CM | POA: Diagnosis not present

## 2021-06-12 DIAGNOSIS — K582 Mixed irritable bowel syndrome: Secondary | ICD-10-CM

## 2021-06-12 MED ORDER — NA SULFATE-K SULFATE-MG SULF 17.5-3.13-1.6 GM/177ML PO SOLN: 1.0000 | ORAL | 0 refills | Status: DC

## 2021-06-12 NOTE — Progress Notes (Signed)
Maylon Peppers, M.D. Gastroenterology & Hepatology [facility] [address] [place], [place] [address]  Primary Care Physician: [name], MD [address] [place] [address]  I will communicate my assessment and recommendations to the referring MD via EMR.  Problems: Rectal bleeding Periappendiceal orifice tubular adenomas s/p piecemeal polypectomy IBS-M  History of Present Illness: Melissa Velazquez is a 71 y.o. female with past medical history of IBS-M, A-fib, hypertension, kidney cancer s/p hypothyroidism, diabetes, asthma, CKD, endometrial cancer, who presents for evaluation of rectal bleeding.  The patient was last seen on 04/16/2020. At that time, the patient was advised to take MiraLAX as needed if she became constipated at some point.  Patient reports that she has presented intermittent specks of blood in her stool since January 2023. Her last episode was 2 weeks ago and it was self limited. Denies any dyschezia. Has some occasional abdominal cramping but very infrequent. Takes dicyclomine as needed for abdominal cramping, but not standing as it causes dry mouth. The patient denies having any nausea, vomiting, fever, chills, melena, hematemesis, abdominal distention, abdominal pain, diarrhea, jaundice, pruritus or recent weight loss.  She is currently on warfarin for afib.  Last Colonoscopy: 2018 - One 10 to 12 mm polyp at the appendiceal orifice, removed piecemeal using a hot snare. Incomplete resection. Resected tissue retrieved. Treated with argon plasma coagulation (APC). - Diverticulosis in the entire examined colon. - Normal mucosa in the sigmoid colon. Biopsied. - External hemorrhoids.  Path: 1. Colon, polyp(s), cecal with periappendiceal biopsies - TUBULAR ADENOMA (X3 FRAGMENTS). - NO HIGH GRADE DYSPLASIA OR MALIGNANCY. 2. Colon, biopsy, random sigmoid - BENIGN COLONIC MUCOSA. - NO ACTIVE  INFLAMMATION OR EVIDENCE OF MICROSCOPIC COLITIS. - NO DYSPLASIA OR MALIGNANCY.  Recommended to repeat in 5 years.  Past Medical History: Past Medical History:  Diagnosis Date   Allergic rhinitis    Anal fissure    Hx of    Anxiety    hx of   Arthritis    Asthma    none in last year    Chronic kidney disease    partial nephrectomy   Depression    hx of   Diabetes mellitus without complication ([place])    no meds   Diverticular disease    Dysrhythmia    palpitations   Eczema    Endometrial cancer ([place]) 2001   spread to appendix    Hemorrhoids    Hyperglycemia    Hypertension    Hypothyroidism    Kidney cancer, primary, with metastasis from kidney to other site [facility])    Obesity    Sebaceous cyst    Thyroid disease    Thyroid nodule    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    Past Surgical History: Past Surgical History:  Procedure Laterality Date   APPENDECTOMY  2008   BIOPSY  12/14/2016   Procedure: BIOPSY;  Surgeon: Rogene Houston, MD;  [facility];  Service: Endoscopy;;  colon   CHOLECYSTECTOMY  2008   COLONOSCOPY N/A 11/24/2012   Procedure: COLONOSCOPY;  Surgeon: Rogene Houston, MD;  [facility];  Service: Endoscopy;  Laterality: N/A;  830   COLONOSCOPY N/A 12/14/2016   Procedure: COLONOSCOPY;  Surgeon: Rogene Houston, MD;  [facility];  Service: Endoscopy;  Laterality: N/A;  12:00   POLYPECTOMY  12/14/2016   Procedure: POLYPECTOMY;  Surgeon: Rogene Houston, MD;  [facility];  Service: Endoscopy;;  colon   ROBOTIC ASSITED PARTIAL NEPHRECTOMY Left 08/21/2016   Procedure: XI ROBOTIC ASSITED PARTIAL NEPHRECTOMY;  Surgeon: Ardis Hughs, MD;  Location: [facility];  Service: Urology;  Laterality: Left;   TOTAL ABDOMINAL HYSTERECTOMY  2001   TOTAL KNEE ARTHROPLASTY Bilateral 01/24/2020   Procedure: TOTAL KNEE BILATERAL;  Surgeon: Gaynelle Arabian, MD;  Location: [facility];  Service: Orthopedics;  Laterality:  Bilateral;   WISDOM TOOTH EXTRACTION      Family History: Family History  Problem Relation Age of Onset   Breast cancer Mother    Diabetes Mother    Obesity Sister    Asthma Sister    Eczema Sister    COPD Father        smoked   Prostate cancer Father    Breast cancer Maternal Aunt    Urticaria Maternal Grandfather    Colon cancer Neg Hx     Social History: Social History   Tobacco Use  Smoking Status Former   Packs/day: 1.00   Years: 20.00   Pack years: 20.00   Types: Cigarettes   Quit date: 01/19/1998   Years since quitting: 23.4   Passive exposure: Past  Smokeless Tobacco Never   Social History   Substance and Sexual Activity  Alcohol Use Yes   Alcohol/week: 0.0 standard drinks   Comment: rare   Social History   Substance and Sexual Activity  Drug Use No    Allergies: Allergies  Allergen Reactions   Triple Antibiotic [Bacitracin-Neomycin-Polymyxin] Anaphylaxis   Levofloxacin Other (See Comments)    Stomach upset   Oxycodone     Tremors and brain fog   Capsaicin Rash   Elastic Bandages & [Zinc] Rash   Nickel Itching and Rash    Medications: Current Outpatient Medications  Medication Sig Dispense Refill   acetaminophen (TYLENOL) 500 MG tablet Take 500 mg by mouth every 6 (six) hours as needed.     albuterol (PROVENTIL HFA;VENTOLIN HFA) 108 (90 Base) MCG/ACT inhaler Inhale 2 puffs into the lungs every 6 (six) hours as needed for wheezing or shortness of breath.     cetirizine (ZYRTEC) 10 MG tablet Take 10 mg by mouth daily as needed for allergies.     diclofenac Sodium (VOLTAREN) 1 % GEL Apply 4 g topically 4 (four) times daily. 350 g 0   dicyclomine (BENTYL) 20 MG tablet TAKE 1 TABLET TWICE DAILY (Patient taking differently: Take 20 mg by mouth daily.) 120 tablet 2   fluticasone (FLONASE) 50 MCG/ACT nasal spray Place 1 spray into both nostrils 2 (two) times daily as needed for allergies or rhinitis. 16 g 5   levothyroxine (SYNTHROID, LEVOTHROID) 75  MCG tablet Take 75 mcg by mouth daily before breakfast.     metoprolol tartrate (LOPRESSOR) 25 MG tablet Take 0.5 tablets (12.5 mg total) by mouth as needed (for palpitations). 45 tablet 3   sertraline (ZOLOFT) 100 MG tablet Take 150 mg by mouth daily with breakfast.     triamcinolone cream (KENALOG) 0.1 % Apply 1 application topically daily as needed (rash).     warfarin (COUMADIN) 5 MG tablet Take 1 1/2 tablets daily except 2 tablets on Mondays and Fridays or as directed 180 tablet 1   No current facility-administered medications for this visit.    Review of Systems: GENERAL: negative for malaise, night sweats HEENT: No changes in hearing or vision, no nose bleeds or other nasal problems. NECK: Negative for lumps, goiter, pain and significant neck swelling RESPIRATORY: Negative for cough, wheezing CARDIOVASCULAR:  Negative for chest pain, leg swelling, palpitations, orthopnea GI: SEE HPI MUSCULOSKELETAL: Negative for joint pain or swelling, back pain, and muscle pain. SKIN: Negative for lesions, rash PSYCH: Negative for sleep disturbance, mood disorder and recent psychosocial stressors. HEMATOLOGY Negative for prolonged bleeding, bruising easily, and swollen nodes. ENDOCRINE: Negative for cold or heat intolerance, polyuria, polydipsia and goiter. NEURO: negative for tremor, gait imbalance, syncope and seizures. The remainder of the review of systems is noncontributory.   Physical Exam: BP 109/61 (BP Location: Left Arm, Patient Position: Sitting, Cuff Size: Large)   Pulse 71   Temp 98.3 F (36.8 C) (Oral)   Ht '5\' 4"'$  (1.696 m)   Wt 213 lb 12.8 oz (97 kg)   BMI 36.70 kg/m  GENERAL: The patient is AO x3, in no acute distress. HEENT: Head is normocephalic and atraumatic. EOMI are intact. Mouth is well hydrated and without lesions. NECK: Supple. No masses LUNGS: Clear to auscultation. No presence of rhonchi/wheezing/rales. Adequate chest expansion HEART: RRR, normal s1 and  s2. ABDOMEN: Soft, nontender, no guarding, no peritoneal signs, and nondistended. BS +. No masses. EXTREMITIES: Without any cyanosis, clubbing, rash, lesions or edema. NEUROLOGIC: AOx3, no focal motor deficit. SKIN: no jaundice, no rashes  Imaging/Labs: as above  I personally reviewed and interpreted the available labs, imaging and endoscopic files.  Impression and Plan: Melissa Velazquez is a 71 y.o. female with past medical history of IBS-M, A-fib, hypertension, kidney cancer s/p hypothyroidism, diabetes, asthma, CKD, endometrial cancer, who presents for evaluation of rectal bleeding. Patient has presented recurrent episodes of painless rectal bleeding. Differential diagnosis includes benign anorectal disease such as hemorrhoids (present in the past), diverticular bleeding, less likely related to malignancy. Will proceed with colonoscopy to evaluate her symptoms further especially as she had piecemeal periappendiceal polypectomy (notably she had an appendectomy in the past). Her IBS-M seems to be well controlled, she can continue antispasmodic as needed for pain control.  - Schedule colonoscopy - Continue Bentyl 20 mg as needed for abdominal pain  All questions were answered.      Harvel Quale, MD Gastroenterology and Hepatology [facility]

## 2021-06-12 NOTE — Telephone Encounter (Signed)
   Pre-operative Risk Assessment    Patient Name: Melissa Velazquez  DOB: 22-Sep-1950 MRN: [id number]      Request for Surgical Clearance    Procedure:   COLONOSCOPY  Date of Surgery:  Clearance 07/25/21                                 Surgeon:  DR. DANIEL CASTANEDA Surgeon's Group or Practice Name:  [facility] Phone number:  [phone] Fax number:  [phone]   Type of Clearance Requested:   - Medical  - Pharmacy:  Hold Warfarin (Coumadin) x 5 DAYS PRIOR   Type of Anesthesia:  MAC   Additional requests/questions:    Jiles Prows   06/12/2021, 2:32 PM

## 2021-06-12 NOTE — Patient Instructions (Addendum)
Schedule colonoscopy Continue Bentyl as needed for abdominal pain

## 2021-06-12 NOTE — Telephone Encounter (Signed)
Jackqueline Aquilar Ann Parks Czajkowski, CMA  ?

## 2021-06-13 ENCOUNTER — Ambulatory Visit (HOSPITAL_COMMUNITY)
Admission: RE | Admit: 2021-06-13 | Discharge: 2021-06-13 | Disposition: A | Discharge status: Home / Self Care | Payer: Medicare Other | Source: Ambulatory Visit | Attending: Family Medicine | Admitting: Family Medicine

## 2021-06-13 DIAGNOSIS — M5030 Other cervical disc degeneration, unspecified cervical region: Secondary | ICD-10-CM | POA: Insufficient documentation

## 2021-06-13 DIAGNOSIS — M542 Cervicalgia: Secondary | ICD-10-CM | POA: Diagnosis not present

## 2021-06-13 NOTE — Telephone Encounter (Signed)
   Primary Cardiologist: Carlyle Dolly, MD  Chart reviewed as part of pre-operative protocol coverage. Given past medical history and time since last visit, based on ACC/AHA guidelines, Melissa Velazquez would be at acceptable risk for the planned procedure without further cardiovascular testing.   Patient with diagnosis of afib on warfarin for anticoagulation.     Procedure: colonoscopy Date of procedure: 07/25/21   CHA2DS2-VASc Score = 3  This indicates a 3.2% annual risk of stroke. The patient's score is based upon: CHF History: 0 HTN History: 1 Diabetes History: 0 Stroke History: 0 Vascular Disease History: 0 Age Score: 1 Gender Score: 1   Has DM listed on her PMH but her A1c is 5.1 and she takes no medications, will not count this.   CrCl 49m/min using adjusted body weight due to obesity Platelet count 184K   Per office protocol, patient can hold warfarin for 5 days prior to procedure. She does not require bridging.  I will route this recommendation to the requesting party via Epic fax function and remove from pre-op pool.  Please call with questions.  JJossie Ng Adeana Grilliot NP-C    06/13/2021, 8:20 AM [address] Office [phone] [phone]

## 2021-06-13 NOTE — Telephone Encounter (Signed)
Patient with diagnosis of afib on warfarin for anticoagulation.    Procedure: colonoscopy Date of procedure: 07/25/21  CHA2DS2-VASc Score = 3  This indicates a 3.2% annual risk of stroke. The patient's score is based upon: CHF History: 0 HTN History: 1 Diabetes History: 0 Stroke History: 0 Vascular Disease History: 0 Age Score: 1 Gender Score: 1   Has DM listed on her PMH but her A1c is 5.1 and she takes no medications, will not count this.  CrCl 63m/min using adjusted body weight due to obesity Platelet count 184K  Per office protocol, patient can hold warfarin for 5 days prior to procedure. She does not require bridging.

## 2021-06-18 DIAGNOSIS — E039 Hypothyroidism, unspecified: Secondary | ICD-10-CM | POA: Diagnosis not present

## 2021-06-18 DIAGNOSIS — I10 Essential (primary) hypertension: Secondary | ICD-10-CM | POA: Diagnosis not present

## 2021-06-18 DIAGNOSIS — M1991 Primary osteoarthritis, unspecified site: Secondary | ICD-10-CM | POA: Diagnosis not present

## 2021-06-23 ENCOUNTER — Ambulatory Visit
Admission: RE | Admit: 2021-06-23 | Discharge: 2021-06-23 | Disposition: A | Discharge status: Home / Self Care | Payer: Medicare Other | Source: Ambulatory Visit | Attending: Urology | Admitting: Urology

## 2021-06-23 ENCOUNTER — Ambulatory Visit: Payer: Medicare Other | Admitting: Cardiology

## 2021-06-23 DIAGNOSIS — N2889 Other specified disorders of kidney and ureter: Secondary | ICD-10-CM

## 2021-06-23 DIAGNOSIS — C642 Malignant neoplasm of left kidney, except renal pelvis: Secondary | ICD-10-CM | POA: Diagnosis not present

## 2021-06-23 HISTORY — PX: IR RADIOLOGIST EVAL & MGMT: IMG5224

## 2021-06-23 NOTE — Consult Note (Addendum)
Chief Complaint: Patient was seen in consultation today for left renal carcinoma recurrence at the request of Herrick,Benjamin W  Referring Physician(s): Herrick,Benjamin W  History of Present Illness: Melissa Velazquez is a 71 y.o. female status post partial nephrectomy on 08/21/2016 to remove a 4.5 cm clear-cell carcinoma of the lower pole of the left kidney.  Pathology demonstrated a clear cell carcinoma, Fuhrman nuclear grade 3 with focally positive margin at the parenchymal margin.  She has done well postoperatively with no complaints.  Follow-up imaging has demonstrated development of a small focus of enhancement along the parenchymal margin of prior partial nephrectomy first noticed by CT on 01/06/2021 and measured at approximately 8 mm.  This was further characterized by MRI on 04/25/2021 and measured at approximately 6 x 10 mm.  There is no evidence of metastatic disease in the abdomen or pelvis.  Past Medical History:  Diagnosis Date   Allergic rhinitis    Anal fissure    Hx of    Anxiety    hx of   Arthritis    Asthma    none in last year    Chronic kidney disease    partial nephrectomy   Depression    hx of   Diabetes mellitus without complication (HCC)    no meds   Diverticular disease    Dysrhythmia    palpitations   Eczema    Endometrial cancer (HCC) 2001   spread to appendix    Hemorrhoids    Hyperglycemia    Hypertension    Hypothyroidism    Kidney cancer, primary, with metastasis from kidney to other site [facility])    Obesity    Sebaceous cyst    Thyroid disease    Thyroid nodule    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    Past Surgical History:  Procedure Laterality Date   APPENDECTOMY  2008   BIOPSY  12/14/2016   Procedure: BIOPSY;  Surgeon: Malissa Hippo, MD;  [facility];  Service: Endoscopy;;  colon   CHOLECYSTECTOMY  2008   COLONOSCOPY N/A 11/24/2012   Procedure: COLONOSCOPY;  Surgeon: Malissa Hippo, MD;  Location: [facility];  Service: Endoscopy;  Laterality: N/A;  830   COLONOSCOPY N/A 12/14/2016   Procedure: COLONOSCOPY;  Surgeon: Malissa Hippo, MD;  [facility];  Service: Endoscopy;  Laterality: N/A;  12:00   POLYPECTOMY  12/14/2016   Procedure: POLYPECTOMY;  Surgeon: Malissa Hippo, MD;  [facility];  Service: Endoscopy;;  colon   ROBOTIC ASSITED PARTIAL NEPHRECTOMY Left 08/21/2016   Procedure: XI ROBOTIC ASSITED PARTIAL NEPHRECTOMY;  Surgeon: Crist Fat, MD;  Location: [facility];  Service: Urology;  Laterality: Left;   TOTAL ABDOMINAL HYSTERECTOMY  2001   TOTAL KNEE ARTHROPLASTY Bilateral 01/24/2020   Procedure: TOTAL KNEE BILATERAL;  Surgeon: Ollen Gross, MD;  Location: [facility];  Service: Orthopedics;  Laterality: Bilateral;   WISDOM TOOTH EXTRACTION      Allergies: Triple antibiotic [bacitracin-neomycin-polymyxin], Levofloxacin, Oxycodone, Capsaicin, Elastic bandages & [zinc], and Nickel  Medications: Prior to Admission medications   Medication Sig Start Date End Date Taking? Authorizing Provider  acetaminophen (TYLENOL) 500 MG tablet Take 500 mg by mouth every 6 (six) hours as needed.    [provider]  albuterol (PROVENTIL HFA;VENTOLIN HFA) 108 (90 Base) MCG/ACT inhaler Inhale 2 puffs into the lungs every 6 (six) hours as needed for wheezing or shortness of breath.    [provider]  cetirizine (ZYRTEC) 10 MG tablet Take 10 mg by mouth daily as needed for allergies.    [provider]  diclofenac Sodium (VOLTAREN) 1 % GEL Apply 4 g topically 4 (four) times daily. 02/10/20   Love, Ivan Anchors, PA-C  dicyclomine (BENTYL) 20 MG tablet TAKE 1 TABLET TWICE DAILY Patient taking differently: Take 20 mg by mouth daily. 01/30/21   Rehman, Mechele Dawley, MD  fluticasone (FLONASE) 50 MCG/ACT nasal spray Place 1 spray into both nostrils 2 (two) times daily as needed for allergies or rhinitis. 12/22/19   Valentina Shaggy, MD  levothyroxine  (SYNTHROID, LEVOTHROID) 75 MCG tablet Take 75 mcg by mouth daily before breakfast.    [provider]  metoprolol tartrate (LOPRESSOR) 25 MG tablet Take 0.5 tablets (12.5 mg total) by mouth as needed (for palpitations). 02/20/20   Weaver, Scott T, PA-C  Na Sulfate-K Sulfate-Mg Sulf (SUPREP BOWEL PREP KIT) 17.5-3.13-1.6 GM/177ML SOLN Take 1 kit by mouth as directed. 06/12/21   Harvel Quale, MD  sertraline (ZOLOFT) 100 MG tablet Take 150 mg by mouth daily with breakfast.    [provider]  triamcinolone cream (KENALOG) 0.1 % Apply 1 application topically daily as needed (rash).    [provider]  warfarin (COUMADIN) 5 MG tablet Take 1 1/2 tablets daily except 2 tablets on Mondays and Fridays or as directed 02/10/21   Arnoldo Lenis, MD     Family History  Problem Relation Age of Onset   Breast cancer Mother    Diabetes Mother    Obesity Sister    Asthma Sister    Eczema Sister    COPD Father        smoked   Prostate cancer Father    Breast cancer Maternal Aunt    Urticaria Maternal Grandfather    Colon cancer Neg Hx     Social History   Socioeconomic History   Marital status: Single    Spouse name: Not on file   Number of children: 0   Years of education: Not on file   Highest education level: Not on file  Occupational History   Occupation: Psychologist  Tobacco Use   Smoking status: Former    Packs/day: 1.00    Years: 20.00    Pack years: 20.00    Types: Cigarettes    Quit date: 01/19/1998    Years since quitting: 23.4    Passive exposure: Past   Smokeless tobacco: Never  Vaping Use   Vaping Use: Never used  Substance and Sexual Activity   Alcohol use: Yes    Alcohol/week: 0.0 standard drinks    Comment: rare   Drug use: No   Sexual activity: Not Currently  Other Topics Concern   Not on file  Social History Narrative   2 cups of caffeine daily    Social Determinants of Health   Financial Resource Strain: Not on file   Food Insecurity: Not on file  Transportation Needs: Not on file  Physical Activity: Not on file  Stress: Not on file  Social Connections: Not on file    ECOG Status: 0 - Asymptomatic  Review of Systems: A 12 point ROS discussed and pertinent positives are indicated in the HPI above.  All other systems are negative.  Review of Systems  Constitutional: Negative.   Respiratory: Negative.    Cardiovascular: Negative.   Gastrointestinal: Negative.   Genitourinary: Negative.   Musculoskeletal: Negative.   Neurological: Negative.    Vital Signs:  There were no vitals taken for this visit.  Physical Exam Constitutional:      General: She is not in acute distress.    Appearance: Normal appearance. She is not ill-appearing, toxic-appearing or diaphoretic.  Cardiovascular:     Rate and Rhythm: Normal rate. Rhythm irregular.     Heart sounds: No murmur heard.   No friction rub. No gallop.  Pulmonary:     Effort: Pulmonary effort is normal. No respiratory distress.     Breath sounds: Normal breath sounds. No stridor. No wheezing, rhonchi or rales.  Abdominal:     General: There is no distension.     Palpations: Abdomen is soft.     Tenderness: There is no abdominal tenderness. There is no right CVA tenderness, left CVA tenderness, guarding or rebound.     Hernia: No hernia is present.  Musculoskeletal:        General: No swelling.  Skin:    General: Skin is warm and dry.  Neurological:     General: No focal deficit present.     Mental Status: She is alert and oriented to person, place, and time.     Imaging: MR CERVICAL SPINE WO CONTRAST  Result Date: 06/14/2021 CLINICAL DATA:  Progressive neck pain over the last 6 months. EXAM: MRI CERVICAL SPINE WITHOUT CONTRAST TECHNIQUE: Multiplanar, multisequence MR imaging of the cervical spine was performed. No intravenous contrast was administered. COMPARISON:  MRI of the cervical spine 02/02/2005 FINDINGS: Alignment: Slight  degenerative retrolisthesis is present at C4-5 no other significant listhesis is present. Mild straightening of the normal cervical lordosis is present. Vertebrae: Chronic edematous endplate changes are present C4-5. No abnormal signal is present the cord. Mild fatty endplate marrow changes are present at C5-6 and C6-7. Marrow signal and vertebral body heights are otherwise normal. Cord: AP diameter of the cord is narrowed at C4-5. Cord signal and morphology is otherwise normal. Posterior Fossa, vertebral arteries, paraspinal tissues: Craniocervical junction is normal. Flow is present in the vertebral arteries bilaterally. Visualized intracranial contents are normal. Disc levels: C2-3: Asymmetric left-sided facet degenerative changes and uncovertebral spurring contribute to mild left foraminal stenosis. C3-4: A leftward disc osteophyte complex present. Asymmetric left-sided facet hypertrophy and spurring is present. Edema is present within the facet joint on the left. This leads to moderate foraminal narrowing bilaterally, left greater than right. C4-5: A broad-based disc osteophyte complex is present. Moderate to severe central canal stenosis is present. The canal is narrowed to 6 mm. Severe foraminal scratched at severe right and moderate left foraminal narrowing is due to uncovertebral and facet spurring. C5-6: A broad-based disc osteophyte complex partially effaces ventral CSF. Uncovertebral spurring leads to moderate foraminal narrowing bilaterally, right greater than left. C6-7: A leftward disc osteophyte complex partially effaces the ventral CSF. Mild right foraminal narrowing is present. C7-T1: Negative. IMPRESSION: 1. Multilevel spondylosis of the cervical spine as described. 2. The most significant stenosis is at C4-5 with moderate to severe central canal stenosis, severe right and moderate left foraminal stenosis. 3. Mild left foraminal narrowing at C2-3. 4. Moderate foraminal narrowing bilaterally at  C3-4 is worse on the left. 5. Left-sided edema at C3-4 consistent with acute arthropathy. 6. Moderate foraminal narrowing bilaterally at C5-6 is worse on the right. 7. Mild right foraminal narrowing at C6-7. Electronically Signed   By: San Morelle M.D.   On: 06/14/2021 14:54    Labs:  CBC: No results for input(s): WBC, HGB, HCT, PLT in the last 8760 hours.  COAGS: Recent Labs    04/03/21 0922 04/17/21 0923 05/08/21 0911 06/05/21 0924  INR 3.9* 3.6* 2.4 1.8*    BMP: Recent Labs    01/06/21 0908  CREATININE 1.00     Assessment and Plan:  I met with Melissa Velazquez.  We reviewed multiple imaging studies.  There is no evidence of enhancing tumor recurrence on a CT dated 10/25/2018.  In retrospect, there may be a very subtle area of enhancement on the CT dated 12/06/2019 at the level of prior partial nephrectomy measuring only approximately 3 mm in diameter.  By my measurements, the nodular enhancement on the 01/06/2021 study seen best on the arterial phase measures roughly 12 x 9 x 9 mm.  This is actually better depicted by CT than on the follow-up MRI and shows no significant growth on the MRI dated 04/25/2021 with maximum transverse diameter of approximately 12 mm by my measurement.  Imaging findings are suspicious for nodular recurrence along the deep margin of prior resection.  I reviewed the potential use of percutaneous ablation to treat the area of tumor recurrence.  The challenging aspect of treatment is its close proximity to the ureter.  The superior aspect of the nodular recurrence immediately abuts the medial aspect of the proximal ureter and UPJ and the inferior aspect is less than 6 mm from the proximal ureter.  The lateral aspect of marginal recurrence abuts the lower pole collecting system.  I told Melissa Velazquez that technically we should be able to treat the recurrence with percutaneous cryoablation but that this would require initial placement of a retrograde ureteral stent  by Dr. Louis Meckel.  Hopefully the proximal ureter and UPJ can also be separated from the area of recurrence and prior surgery by utilizing hydro-dissection during treatment to create more of a buffer between the proximal ureter and area of tumor recurrence.  Encroachment on the lower pole collecting system should not be as much of an issue as it would only be an isolated portion of the lower pole collecting system.  After discussion, Melissa Velazquez is interested in treatment and we will begin the scheduling process at [facility] and also coordinating ureteral stent placement with Dr. Carlton Adam office approximately 1-2 weeks prior to cryoablation.  We will also plan to leave the stent in place at least for a month after treatment to allow some healing to occur prior to stent removal.  Melissa Velazquez is also on Coumadin for atrial fibrillation which is followed by Dr. Harl Bowie.  We will check with Dr. Nelly Laurence office for clearance to hold Coumadin prior to ablation and determine if any bridging therapy is needed.  Thank you for this interesting consult.  I greatly enjoyed meeting Melissa Velazquez and look forward to participating in their care.  A copy of this report was sent to the requesting provider on this date.  Electronically Signed: Azzie Roup 06/23/2021, 10:17 AM    I spent a total of  40 Minutes in face to face in clinical consultation, greater than 50% of which was counseling/coordinating care for left renal carcinoma recurrence.

## 2021-06-25 ENCOUNTER — Telehealth: Payer: Self-pay | Admitting: Cardiology

## 2021-06-25 NOTE — Telephone Encounter (Signed)
   Pre-operative Risk Assessment    Patient Name: Melissa Velazquez  DOB: Jun 05, 1950 MRN: [id number]      Request for Surgical Clearance    Procedure:   left renal cryoablation   Date of Surgery:  Clearance 08/06/21                                 Surgeon:  DR. Kathlene Cote Surgeon's Group or Practice Name:  [facility]  Phone number:  [phone] Fax number:  [phone]   Type of Clearance Requested:   - Medical  - Pharmacy:  Hold Warfarin (Coumadin) 4 days prior    Type of Anesthesia:  General    Additional requests/questions:      SignedMilbert Coulter   06/25/2021, 3:16 PM

## 2021-06-26 ENCOUNTER — Ambulatory Visit (INDEPENDENT_AMBULATORY_CARE_PROVIDER_SITE_OTHER): Payer: Medicare Other | Admitting: *Deleted

## 2021-06-26 DIAGNOSIS — Z5181 Encounter for therapeutic drug level monitoring: Secondary | ICD-10-CM

## 2021-06-26 DIAGNOSIS — I480 Paroxysmal atrial fibrillation: Secondary | ICD-10-CM | POA: Diagnosis not present

## 2021-06-26 LAB — POCT INR: INR: 1.8 — AB (ref 2.0–3.0)

## 2021-06-26 MED ORDER — WARFARIN SODIUM 5 MG PO TABS: ORAL_TABLET | ORAL | 1 refills | Status: DC

## 2021-06-26 NOTE — Patient Instructions (Signed)
Increase dose to 1 1/2 tablets daily except 2 tablets on Sundays, Tuesdays and Thursdays -Recheck INR in 3 weeks.  Takes diflucan when needed Scheduled for colonoscopy on 07/25/21

## 2021-06-28 NOTE — Telephone Encounter (Signed)
Patient with diagnosis of atrial fibrillation on warfarin for anticoagulation.    Procedure: left renal cryoablation Date of procedure: 08/06/21   CHA2DS2-VASc Score = 3   This indicates a 3.2% annual risk of stroke. The patient's score is based upon: CHF History: 0 HTN History: 1 Diabetes History: 0 Stroke History: 0 Vascular Disease History: 0 Age Score: 1 Gender Score: 1  CrCl 59 Platelet count 163.  Per office protocol, patient can hold warfarin for 4 days prior to procedure.   Patient will not need bridging with Lovenox (enoxaparin) around procedure.  INR followed by [facility] office - next appt 6/28 has note okay to hold x 4 days

## 2021-06-30 DIAGNOSIS — D3131 Benign neoplasm of right choroid: Secondary | ICD-10-CM | POA: Diagnosis not present

## 2021-06-30 NOTE — Telephone Encounter (Signed)
Tried to call the pt to set up tele pre op appt though no answer

## 2021-06-30 NOTE — Telephone Encounter (Signed)
Primary Cardiologist:Branch, Roderic Palau, MD  Chart reviewed as part of pre-operative protocol coverage. Because of Melissa Velazquez's past medical history and time since last visit, he/she will require a virtual visit/telephone call in order to better assess preoperative cardiovascular risk.  Pre-op covering staff: - Please contact patient, obtain consent, and schedule appointment

## 2021-07-08 NOTE — Telephone Encounter (Signed)
Left message for pt to call back for tele pre op appt.  

## 2021-07-09 ENCOUNTER — Telehealth: Payer: Self-pay | Admitting: *Deleted

## 2021-07-09 NOTE — Telephone Encounter (Signed)
Pt has been scheduled for a televisit 07/10/21 10:00, consent on file / medications reconciled.    Patient Consent for Virtual Visit        Melissa Velazquez has provided verbal consent on 07/09/2021 for a virtual visit (video or telephone).   CONSENT FOR VIRTUAL VISIT FOR:  Melissa Velazquez  By participating in this virtual visit I agree to the following:  I hereby voluntarily request, consent and authorize [place] and its employed or contracted physicians, physician assistants, nurse practitioners or other licensed health care professionals (the Practitioner), to provide me with telemedicine health care services (the "Services") as deemed necessary by the treating Practitioner. I acknowledge and consent to receive the Services by the Practitioner via telemedicine. I understand that the telemedicine visit will involve communicating with the Practitioner through live audiovisual communication technology and the disclosure of certain medical information by electronic transmission. I acknowledge that I have been given the opportunity to request an in-person assessment or other available alternative prior to the telemedicine visit and am voluntarily participating in the telemedicine visit.  I understand that I have the right to withhold or withdraw my consent to the use of telemedicine in the course of my care at any time, without affecting my right to future care or treatment, and that the Practitioner or I may terminate the telemedicine visit at any time. I understand that I have the right to inspect all information obtained and/or recorded in the course of the telemedicine visit and may receive copies of available information for a reasonable fee.  I understand that some of the potential risks of receiving the Services via telemedicine include:  Delay or interruption in medical evaluation due to technological equipment failure or disruption; Information transmitted may not be sufficient (e.g.  poor resolution of images) to allow for appropriate medical decision making by the Practitioner; and/or  In rare instances, security protocols could fail, causing a breach of personal health information.  Furthermore, I acknowledge that it is my responsibility to provide information about my medical history, conditions and care that is complete and accurate to the best of my ability. I acknowledge that Practitioner's advice, recommendations, and/or decision may be based on factors not within their control, such as incomplete or inaccurate data provided by me or distortions of diagnostic images or specimens that may result from electronic transmissions. I understand that the practice of medicine is not an exact science and that Practitioner makes no warranties or guarantees regarding treatment outcomes. I acknowledge that a copy of this consent can be made available to me via my patient portal ([place]), or I can request a printed copy by calling the office of [facility].    I understand that my insurance will be billed for this visit.   I have read or had this consent read to me. I understand the contents of this consent, which adequately explains the benefits and risks of the Services being provided via telemedicine.  I have been provided ample opportunity to ask questions regarding this consent and the Services and have had my questions answered to my satisfaction. I give my informed consent for the services to be provided through the use of telemedicine in my medical care

## 2021-07-09 NOTE — Telephone Encounter (Signed)
Patient was returning call. Please advise ?

## 2021-07-09 NOTE — Telephone Encounter (Signed)
Pt has been scheduled for a televisit, 07/10/21 10:00, clearance will be addressed at that time.  Will route back to the requesting surgeon's office to make them aware.

## 2021-07-10 ENCOUNTER — Ambulatory Visit (INDEPENDENT_AMBULATORY_CARE_PROVIDER_SITE_OTHER): Payer: Medicare Other | Admitting: Physician Assistant

## 2021-07-10 ENCOUNTER — Other Ambulatory Visit (HOSPITAL_COMMUNITY): Payer: Self-pay | Admitting: Interventional Radiology

## 2021-07-10 DIAGNOSIS — Z7901 Long term (current) use of anticoagulants: Secondary | ICD-10-CM | POA: Diagnosis not present

## 2021-07-10 DIAGNOSIS — C642 Malignant neoplasm of left kidney, except renal pelvis: Secondary | ICD-10-CM

## 2021-07-10 DIAGNOSIS — Z01810 Encounter for preprocedural cardiovascular examination: Secondary | ICD-10-CM | POA: Diagnosis not present

## 2021-07-16 ENCOUNTER — Ambulatory Visit (INDEPENDENT_AMBULATORY_CARE_PROVIDER_SITE_OTHER): Payer: Medicare Other | Admitting: *Deleted

## 2021-07-16 DIAGNOSIS — I480 Paroxysmal atrial fibrillation: Secondary | ICD-10-CM | POA: Diagnosis not present

## 2021-07-16 DIAGNOSIS — Z5181 Encounter for therapeutic drug level monitoring: Secondary | ICD-10-CM | POA: Diagnosis not present

## 2021-07-16 LAB — POCT INR: INR: 3.6 — AB (ref 2.0–3.0)

## 2021-07-16 NOTE — Patient Instructions (Signed)
Hold warfarin tonight then decrease dose to 1 1/2 tablets daily except 2 tablets on Sundays and Thursdays.  Pending Colonoscopy on 07/25/21  Will hold warfarin 5 days before procedure (7/2 - 7/6).  Resume warfarin at above dose night of procedure if OK with MD.  Pending Lt renal cryoablation on 08/06/21.  Will hold warfarin 4 days prior to procedure (7/15 - 7/18)  Resume warfarin at above dose night of procedure if OK with MD.  Recheck INR on 08/13/21 Takes diflucan when needed

## 2021-07-17 ENCOUNTER — Encounter: Payer: Self-pay | Admitting: Cardiology

## 2021-07-17 ENCOUNTER — Ambulatory Visit (INDEPENDENT_AMBULATORY_CARE_PROVIDER_SITE_OTHER): Payer: Medicare Other | Admitting: Cardiology

## 2021-07-17 VITALS — BP 98/70 | HR 58 | Ht 64.0 in | Wt 205.0 lb

## 2021-07-17 DIAGNOSIS — I480 Paroxysmal atrial fibrillation: Secondary | ICD-10-CM

## 2021-07-17 NOTE — Patient Instructions (Signed)
Medication Instructions:  Your physician recommends that you continue on your current medications as directed. Please refer to the Current Medication list given to you today. *If you need a refill on your cardiac medications before your next appointment, please call your pharmacy*  Lab Work: None. If you have labs (blood work) drawn today and your tests are completely normal, you will receive your results only by: [place] (if you have MyChart) OR A paper copy in the mail If you have any lab test that is abnormal or we need to change your treatment, we will call you to review the results.  Testing/Procedures: None.  Follow-Up: At [facility], you and your health needs are our priority.  As part of our continuing mission to provide you with exceptional heart care, we have created designated Provider Care Teams.  These Care Teams include your primary Cardiologist (physician) and Advanced Practice Providers (APPs -  Physician Assistants and Nurse Practitioners) who all work together to provide you with the care you need, when you need it.  Your physician wants you to follow-up in: As needed with Lars Mage, MD or one of the following Advanced Practice Providers on your designated Care Team:    Tommye Standard, [place] Legrand Como "Jonni Sanger" [place], [place]   You will receive a reminder letter in the mail two months in advance. If you don't receive a letter, please call our office to schedule the follow-up appointment.  We recommend signing up for the patient portal called "MyChart".  Sign up information is provided on this After Visit Summary.  MyChart is used to connect with patients for Virtual Visits (Telemedicine).  Patients are able to view lab/test results, encounter notes, upcoming appointments, etc.  Non-urgent messages can be sent to your provider as well.   To learn more about what you can do with MyChart, go to [email or web address].    Any Other Special Instructions Will Be Listed  Below (If Applicable).

## 2021-07-17 NOTE — Progress Notes (Signed)
Electrophysiology Office Note:    Date:  07/17/2021   ID:  Carsen, Machi 02/26/1950, MRN [id number]  PCP:  [name], MD  [facility] Cardiologist:  Carlyle Dolly, MD  [facility] Electrophysiologist:  None   Referring MD: Arnoldo Lenis, MD   Chief Complaint: New patient consult for Watchman  History of Present Illness:    CALY PELLUM is a 71 y.o. female who presents for an evaluation for Watchman consult at the request of Dr. Harl Bowie. Their medical history includes CKD s/p partial nephrectomy, endometrial cancer, diabetes, hypertension, hypothyroidism, and obesity.  She saw Dr. Harl Bowie on 05/16/2021. It was noted her atrial fibrillation had been diagnosed 01/2020 during her admission. Her EKG showed ectopic atrial rhythm at 70 bpm. She was interested in more information regarding the watchman device, so she was referred to EP.  Today, she reports she had prior bilateral total knee replacements; at that time it was determined that she had a nickel allergy.  Currently on coumadin. She notes it has been difficult to regulate this. She has not been able to start Eliquis due to high copays.  In her family her maternal grandmother had strokes.  They deny any palpitations, chest pain, shortness of breath, or peripheral edema. No lightheadedness, headaches, syncope, orthopnea, or PND.      Past Medical History:  Diagnosis Date   Allergic rhinitis    Anal fissure    Hx of    Anxiety    hx of   Arthritis    Asthma    none in last year    Chronic kidney disease    partial nephrectomy   Depression    hx of   Diabetes mellitus without complication ([place])    no meds   Diverticular disease    Dysrhythmia    palpitations   Eczema    Endometrial cancer ([place]) 2001   spread to appendix    Hemorrhoids    Hyperglycemia    Hypertension    Hypothyroidism    Kidney cancer, primary, with metastasis from kidney to other site [facility])    Obesity    Sebaceous cyst     Thyroid disease    Thyroid nodule    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    Past Surgical History:  Procedure Laterality Date   APPENDECTOMY  2008   BIOPSY  12/14/2016   Procedure: BIOPSY;  Surgeon: Rogene Houston, MD;  [facility];  Service: Endoscopy;;  colon   CHOLECYSTECTOMY  2008   COLONOSCOPY N/A 11/24/2012   Procedure: COLONOSCOPY;  Surgeon: Rogene Houston, MD;  [facility];  Service: Endoscopy;  Laterality: N/A;  830   COLONOSCOPY N/A 12/14/2016   Procedure: COLONOSCOPY;  Surgeon: Rogene Houston, MD;  [facility];  Service: Endoscopy;  Laterality: N/A;  12:00   IR RADIOLOGIST EVAL & MGMT  06/23/2021   POLYPECTOMY  12/14/2016   Procedure: POLYPECTOMY;  Surgeon: Rogene Houston, MD;  [facility];  Service: Endoscopy;;  colon   ROBOTIC ASSITED PARTIAL NEPHRECTOMY Left 08/21/2016   Procedure: XI ROBOTIC ASSITED PARTIAL NEPHRECTOMY;  Surgeon: Ardis Hughs, MD;  [facility];  Service: Urology;  Laterality: Left;   TOTAL ABDOMINAL HYSTERECTOMY  2001   TOTAL KNEE ARTHROPLASTY Bilateral 01/24/2020   Procedure: TOTAL KNEE BILATERAL;  Surgeon: Gaynelle Arabian, MD;  [facility];  Service: Orthopedics;  Laterality: Bilateral;   WISDOM TOOTH EXTRACTION  Current Medications: Current Meds  Medication Sig   acetaminophen (TYLENOL) 500 MG tablet Take 500 mg by mouth every 6 (six) hours as needed for moderate pain.   albuterol (PROVENTIL HFA;VENTOLIN HFA) 108 (90 Base) MCG/ACT inhaler Inhale 2 puffs into the lungs every 6 (six) hours as needed for wheezing or shortness of breath.   Calcium-Magnesium-Vitamin D (CALCIUM 1200+D3 PO) Take 1 tablet by mouth once a week.   cetirizine (ZYRTEC) 10 MG tablet Take 10 mg by mouth daily as needed for allergies.   Cholecalciferol (VITAMIN D3) 125 MCG (5000 UT) CAPS Take 5,000 Units by mouth daily.   dicyclomine (BENTYL) 20 MG tablet Take 20 mg by mouth 3 (three) times daily as  needed for spasms.   fluticasone (FLONASE) 50 MCG/ACT nasal spray Place 1 spray into both nostrils 2 (two) times daily as needed for allergies or rhinitis.   levothyroxine (SYNTHROID, LEVOTHROID) 75 MCG tablet Take 75 mcg by mouth daily before breakfast.   metoprolol tartrate (LOPRESSOR) 25 MG tablet Take 0.5 tablets (12.5 mg total) by mouth as needed (for palpitations).   Na Sulfate-K Sulfate-Mg Sulf (SUPREP BOWEL PREP KIT) 17.5-3.13-1.6 GM/177ML SOLN Take 1 kit by mouth as directed.   sertraline (ZOLOFT) 100 MG tablet Take 150 mg by mouth daily with breakfast.   triamcinolone cream (KENALOG) 0.1 % Apply 1 application topically daily as needed (rash).   warfarin (COUMADIN) 5 MG tablet Take 1 1/2 - 2 tablets daily as directed by coumadin clinic (Patient taking differently: Take 7.5-10 mg by mouth See admin instructions. Take 1 1/2 - 2 tablets daily as directed by coumadin clinic; 10 mg Tues Thurs and Sat, 7.5 mg all other days)     Allergies:   Triple antibiotic [bacitracin-neomycin-polymyxin], Levaquin [levofloxacin], Oxycodone, Capsaicin, Elastic bandages & [zinc], and Nickel   Social History   Socioeconomic History   Marital status: Single    Spouse name: Not on file   Number of children: 0   Years of education: Not on file   Highest education level: Not on file  Occupational History   Occupation: Psychologist  Tobacco Use   Smoking status: Former    Packs/day: 1.00    Years: 20.00    Total pack years: 20.00    Types: Cigarettes    Quit date: 01/19/1998    Years since quitting: 23.5    Passive exposure: Past   Smokeless tobacco: Never  Vaping Use   Vaping Use: Never used  Substance and Sexual Activity   Alcohol use: Yes    Alcohol/week: 0.0 standard drinks of alcohol    Comment: rare   Drug use: No   Sexual activity: Not Currently  Other Topics Concern   Not on file  Social History Narrative   2 cups of caffeine daily    Social Determinants of Health   Financial  Resource Strain: Not on file  Food Insecurity: Not on file  Transportation Needs: Not on file  Physical Activity: Not on file  Stress: Not on file  Social Connections: Not on file     Family History: The patient's family history includes Asthma in her sister; Breast cancer in her maternal aunt and mother; COPD in her father; Diabetes in her mother; Eczema in her sister; Obesity in her sister; Prostate cancer in her father; Urticaria in her maternal grandfather. There is no history of Colon cancer.  ROS:   Please see the history of present illness.    All other systems reviewed and are negative.  EKGs/Labs/Other Studies Reviewed:    The following studies were reviewed today:  Bilateral LE Venous Doppler  02/01/2020: Summary:  BILATERAL:  - No evidence of deep vein thrombosis seen in the lower extremities,  bilaterally.    01/29/2020  Echocardiogram:  1. Left ventricular ejection fraction, by estimation, is 60 to 65%. The  left ventricle has normal function. The left ventricle has no regional  wall motion abnormalities. There is mild concentric left ventricular  hypertrophy and moderate basal septal  hypertrophy.   2. Right ventricular systolic function is mildly reduced. The right  ventricular size is normal. There is moderately elevated pulmonary artery  systolic pressure. The estimated right ventricular systolic pressure is  11.1 mmHg.   3. Large pleural effusion in the left lateral region.   4. The mitral valve is normal in structure. No evidence of mitral valve  regurgitation. No evidence of mitral stenosis.   5. The aortic valve is tricuspid. Aortic valve regurgitation is not  visualized. Mild aortic valve sclerosis is present, with no evidence of  aortic valve stenosis. Aortic valve area, by VTI measures 2.41 cm. Aortic  valve mean gradient measures 4.0 mmHg.   Aortic valve Vmax measures 1.51 m/s.   6. The inferior vena cava is dilated in size with <50% respiratory   variability, suggesting right atrial pressure of 15 mmHg.   7. Tricuspid valve regurgitation is mild to moderate.   EKG:   EKG is personally reviewed.  07/17/2021:  EKG was not ordered.    Recent Labs: 01/06/2021: Creatinine, Ser 1.00   Recent Lipid Panel    Component Value Date/Time   CHOL 101 03/19/2008 2053   TRIG 33 03/19/2008 2053   HDL 40 03/19/2008 2053   CHOLHDL 2.5 Ratio 03/19/2008 2053   VLDL 7 03/19/2008 2053   LDLCALC 54 03/19/2008 2053    Physical Exam:    VS:  BP 98/70 (BP Location: Left Arm, Patient Position: Sitting, Cuff Size: Large)   Pulse (!) 58   Ht $R'5\' 4"'iI$  (1.626 m)   Wt 205 lb (93 kg)   BMI 35.19 kg/m     Wt Readings from Last 3 Encounters:  07/17/21 205 lb (93 kg)  06/12/21 213 lb 12.8 oz (97 kg)  05/16/21 211 lb 6.4 oz (95.9 kg)     GEN: Well nourished, well developed in no acute distress HEENT: Normal NECK: No JVD; No carotid bruits LYMPHATICS: No lymphadenopathy CARDIAC: RRR, no murmurs, rubs, gallops RESPIRATORY:  Clear to auscultation without rales, wheezing or rhonchi  ABDOMEN: Soft, non-tender, non-distended MUSCULOSKELETAL:  No edema; No deformity  SKIN: Warm and dry NEUROLOGIC:  Alert and oriented x 3 PSYCHIATRIC:  Normal affect       ASSESSMENT:    No diagnosis found. PLAN:    In order of problems listed above:  #Atrial fibrillation Currently on Coumadin for stroke prophylaxis but has had wildly fluctuating INRs.  She has been hesitant to pursue NOAC given high costs associated with these medications.  Unfortunately, the patient has a nickel allergy which precludes use of the Watchman device given the nitinol frame.  I discussed this at length with the patient during today's visit.  We discussed surgical appendage closure and alternative anticoagulants during today's visit including Pradaxa, Xarelto and Eliquis.  She should check with her insurance company about these medications to see if one of them is more affordable for  her.  Follow-up with me on an as-needed basis.    Medication Adjustments/Labs and Tests Ordered:  Current medicines are reviewed at length with the patient today.  Concerns regarding medicines are outlined above.  No orders of the defined types were placed in this encounter.  No orders of the defined types were placed in this encounter.   I,Mathew Stumpf,acting as a Education administrator for Vickie Epley, MD.,have documented all relevant documentation on the behalf of Vickie Epley, MD,as directed by  Vickie Epley, MD while in the presence of Vickie Epley, MD.  I, Vickie Epley, MD, have reviewed all documentation for this visit. The documentation on 07/17/21 for the exam, diagnosis, procedures, and orders are all accurate and complete.   Signed, Hilton Cork. Quentin Ore, MD, [facility], [facility] 07/17/2021 10:31 AM    Electrophysiology [facility]

## 2021-07-21 ENCOUNTER — Other Ambulatory Visit (INDEPENDENT_AMBULATORY_CARE_PROVIDER_SITE_OTHER): Payer: Self-pay

## 2021-07-21 ENCOUNTER — Other Ambulatory Visit: Payer: Self-pay | Admitting: Radiology

## 2021-07-21 ENCOUNTER — Other Ambulatory Visit (HOSPITAL_COMMUNITY): Payer: Self-pay | Admitting: Interventional Radiology

## 2021-07-21 DIAGNOSIS — N2889 Other specified disorders of kidney and ureter: Secondary | ICD-10-CM

## 2021-07-21 DIAGNOSIS — K625 Hemorrhage of anus and rectum: Secondary | ICD-10-CM

## 2021-07-23 ENCOUNTER — Encounter (HOSPITAL_COMMUNITY)
Admission: RE | Admit: 2021-07-23 | Discharge: 2021-07-23 | Disposition: A | Discharge status: Home / Self Care | Payer: Medicare Other | Source: Ambulatory Visit | Attending: Gastroenterology | Admitting: Gastroenterology

## 2021-07-24 NOTE — Patient Instructions (Signed)
DUE TO COVID-19 ONLY TWO VISITORS  (aged 70 and older)  ARE ALLOWED TO COME WITH YOU AND STAY IN THE WAITING ROOM ONLY DURING PRE OP AND PROCEDURE.   **NO VISITORS ARE ALLOWED IN THE SHORT STAY AREA OR RECOVERY ROOM!!**  IF YOU WILL BE ADMITTED INTO THE HOSPITAL YOU ARE ALLOWED ONLY FOUR SUPPORT PEOPLE DURING VISITATION HOURS ONLY (7 AM -8PM)   The support person(s) must pass our screening, gel in and out, and wear a mask at all times, including in the patient's room. Patients must also wear a mask when staff or their support person are in the room. Visitors GUEST BADGE MUST BE WORN VISIBLY  One adult visitor may remain with you overnight and MUST be in the room by 8 P.M.     Your procedure is scheduled on: 08/06/21   Report to [facility] Main Entrance    Report to admitting at 9:45 AM   Call this number if you have problems the morning of surgery [phone]   Do not eat food :After Midnight.   After Midnight you may have the following liquids until ______ AM/ PM DAY OF SURGERY  Water Black Coffee (sugar ok, NO MILK/CREAM OR CREAMERS)  Tea (sugar ok, NO MILK/CREAM OR CREAMERS) regular and decaf                             Plain Jell-O (NO RED)                                           Fruit ices (not with fruit pulp, NO RED)                                     Popsicles (NO RED)                                                                  Juice: apple, WHITE grape, WHITE cranberry Sports drinks like Gatorade (NO RED) Clear broth(vegetable,chicken,beef)                            If you have questions, please contact your surgeon's office.       Oral Hygiene is also important to reduce your risk of infection.                                    Remember - BRUSH YOUR TEETH THE MORNING OF SURGERY WITH YOUR REGULAR TOOTHPASTE   Do NOT smoke after Midnight   Take these medicines the morning of surgery with A SIP OF WATER: Zoloft, Levothyroxine, Metoprolol,  Zyrtec,  Use your inhalers and bring them with you.                                You may not have any metal on your body including hair pins,  jewelry, and body piercing             Do not wear make-up, lotions, powders, perfumes/cologne, or deodorant  Do not wear nail polish including gel and S&S, artificial/acrylic nails, or any other type of covering on natural nails including finger and toenails. If you have artificial nails, gel coating, etc. that needs to be removed by a nail salon please have this removed prior to surgery or surgery may need to be canceled/ delayed if the surgeon/ anesthesia feels like they are unable to be safely monitored.   Do not shave  48 hours prior to surgery.     Do not bring valuables to the hospital. [place].   Contacts, dentures or bridgework may not be worn into surgery.   Bring small overnight bag day of surgery.   DO NOT [employer]. PHARMACY WILL DISPENSE MEDICATIONS LISTED ON YOUR MEDICATION LIST TO YOU DURING YOUR ADMISSION [place]!       Special Instructions: Bring a copy of your healthcare power of attorney and living will documents  the day of surgery if you haven't scanned them before.              Please read over the following fact sheets you were given: IF YOU HAVE QUESTIONS ABOUT YOUR PRE-OP INSTRUCTIONS PLEASE CALL [phone]     [facility] - Preparing for Surgery Before surgery, you can play an important role.  Because skin is not sterile, your skin needs to be as free of germs as possible.  You can reduce the number of germs on your skin by washing with CHG (chlorahexidine gluconate) soap before surgery.  CHG is an antiseptic cleaner which kills germs and bonds with the skin to continue killing germs even after washing. Please DO NOT use if you have an allergy to CHG or antibacterial soaps.  If your skin becomes reddened/irritated stop  using the CHG and inform your nurse when you arrive at Short Stay. Do not shave (including legs and underarms) for at least 48 hours prior to the first CHG shower.   Please follow these instructions carefully:  1.  Shower with CHG Soap the night before surgery and the  morning of Surgery.  2.  If you choose to wash your hair, wash your hair first as usual with your  normal  shampoo.  3.  After you shampoo, rinse your hair and body thoroughly to remove the  shampoo.                            4.  Use CHG as you would any other liquid soap.  You can apply chg directly  to the skin and wash                       Gently with a scrungie or clean washcloth.  5.  Apply the CHG Soap to your body ONLY FROM THE NECK DOWN.   Do not use on face/ open                           Wound or open sores. Avoid contact with eyes, ears mouth and genitals (private parts).  Wash face,  Genitals (private parts) with your normal soap.             6.  Wash thoroughly, paying special attention to the area where your surgery  will be performed.  7.  Thoroughly rinse your body with warm water from the neck down.  8.  DO NOT shower/wash with your normal soap after using and rinsing off  the CHG Soap.                9.  Pat yourself dry with a clean towel.            10.  Wear clean pajamas.            11.  Place clean sheets on your bed the night of your first shower and do not  sleep with pets. Day of Surgery : Do not apply any lotions/deodorants the morning of surgery.  Please wear clean clothes to the hospital/surgery center.  FAILURE TO FOLLOW THESE INSTRUCTIONS MAY RESULT IN THE CANCELLATION OF YOUR SURGERY    ________________________________________________________________________

## 2021-07-25 ENCOUNTER — Ambulatory Visit (HOSPITAL_COMMUNITY)
Admission: RE | Admit: 2021-07-25 | Discharge: 2021-07-25 | Disposition: A | Discharge status: Home / Self Care | Payer: Medicare Other | Attending: Gastroenterology | Admitting: Gastroenterology

## 2021-07-25 ENCOUNTER — Encounter (HOSPITAL_COMMUNITY): Payer: Self-pay | Admitting: Gastroenterology

## 2021-07-25 ENCOUNTER — Encounter (HOSPITAL_COMMUNITY)
Admission: RE | Disposition: A | Discharge status: Home / Self Care | Payer: Self-pay | Source: Home / Self Care | Attending: Gastroenterology

## 2021-07-25 ENCOUNTER — Ambulatory Visit (HOSPITAL_BASED_OUTPATIENT_CLINIC_OR_DEPARTMENT_OTHER): Payer: Medicare Other | Admitting: Anesthesiology

## 2021-07-25 ENCOUNTER — Ambulatory Visit (HOSPITAL_COMMUNITY): Payer: Medicare Other | Admitting: Anesthesiology

## 2021-07-25 DIAGNOSIS — N189 Chronic kidney disease, unspecified: Secondary | ICD-10-CM | POA: Insufficient documentation

## 2021-07-25 DIAGNOSIS — K5730 Diverticulosis of large intestine without perforation or abscess without bleeding: Secondary | ICD-10-CM

## 2021-07-25 DIAGNOSIS — Z85528 Personal history of other malignant neoplasm of kidney: Secondary | ICD-10-CM | POA: Insufficient documentation

## 2021-07-25 DIAGNOSIS — Z8542 Personal history of malignant neoplasm of other parts of uterus: Secondary | ICD-10-CM | POA: Diagnosis not present

## 2021-07-25 DIAGNOSIS — Z87891 Personal history of nicotine dependence: Secondary | ICD-10-CM | POA: Insufficient documentation

## 2021-07-25 DIAGNOSIS — K648 Other hemorrhoids: Secondary | ICD-10-CM

## 2021-07-25 DIAGNOSIS — K635 Polyp of colon: Secondary | ICD-10-CM | POA: Diagnosis not present

## 2021-07-25 DIAGNOSIS — E039 Hypothyroidism, unspecified: Secondary | ICD-10-CM | POA: Insufficient documentation

## 2021-07-25 DIAGNOSIS — J45909 Unspecified asthma, uncomplicated: Secondary | ICD-10-CM | POA: Insufficient documentation

## 2021-07-25 DIAGNOSIS — E1122 Type 2 diabetes mellitus with diabetic chronic kidney disease: Secondary | ICD-10-CM | POA: Diagnosis not present

## 2021-07-25 DIAGNOSIS — C180 Malignant neoplasm of cecum: Secondary | ICD-10-CM | POA: Diagnosis not present

## 2021-07-25 DIAGNOSIS — I4891 Unspecified atrial fibrillation: Secondary | ICD-10-CM | POA: Diagnosis not present

## 2021-07-25 DIAGNOSIS — I10 Essential (primary) hypertension: Secondary | ICD-10-CM | POA: Diagnosis not present

## 2021-07-25 DIAGNOSIS — K589 Irritable bowel syndrome without diarrhea: Secondary | ICD-10-CM | POA: Insufficient documentation

## 2021-07-25 DIAGNOSIS — K625 Hemorrhage of anus and rectum: Secondary | ICD-10-CM | POA: Insufficient documentation

## 2021-07-25 DIAGNOSIS — K219 Gastro-esophageal reflux disease without esophagitis: Secondary | ICD-10-CM | POA: Diagnosis not present

## 2021-07-25 DIAGNOSIS — Z9049 Acquired absence of other specified parts of digestive tract: Secondary | ICD-10-CM | POA: Insufficient documentation

## 2021-07-25 DIAGNOSIS — I129 Hypertensive chronic kidney disease with stage 1 through stage 4 chronic kidney disease, or unspecified chronic kidney disease: Secondary | ICD-10-CM | POA: Insufficient documentation

## 2021-07-25 DIAGNOSIS — D122 Benign neoplasm of ascending colon: Secondary | ICD-10-CM | POA: Diagnosis not present

## 2021-07-25 DIAGNOSIS — Z905 Acquired absence of kidney: Secondary | ICD-10-CM | POA: Diagnosis not present

## 2021-07-25 DIAGNOSIS — D124 Benign neoplasm of descending colon: Secondary | ICD-10-CM | POA: Diagnosis not present

## 2021-07-25 HISTORY — PX: POLYPECTOMY: SHX5525

## 2021-07-25 HISTORY — PX: COLONOSCOPY WITH PROPOFOL: SHX5780

## 2021-07-25 HISTORY — PX: BIOPSY: SHX5522

## 2021-07-25 HISTORY — PX: COLONOSCOPY: SHX174

## 2021-07-25 LAB — HM COLONOSCOPY

## 2021-07-25 SURGERY — COLONOSCOPY WITH PROPOFOL
Anesthesia: General

## 2021-07-25 MED ORDER — PHENYLEPHRINE HCL (PRESSORS) 10 MG/ML IV SOLN
INTRAVENOUS | Status: AC
  Filled 2021-07-25: qty 1

## 2021-07-25 MED ORDER — PHENYLEPHRINE 80 MCG/ML (10ML) SYRINGE FOR IV PUSH (FOR BLOOD PRESSURE SUPPORT)
PREFILLED_SYRINGE | INTRAVENOUS | Status: AC
  Filled 2021-07-25: qty 40

## 2021-07-25 MED ORDER — PROPOFOL 1000 MG/100ML IV EMUL
INTRAVENOUS | Status: AC
  Filled 2021-07-25: qty 400

## 2021-07-25 MED ORDER — LACTATED RINGERS IV SOLN: INTRAVENOUS | Status: DC | PRN

## 2021-07-25 MED ORDER — PROPOFOL 500 MG/50ML IV EMUL
INTRAVENOUS | Status: DC | PRN
  Administered 2021-07-25: 150 ug/kg/min via INTRAVENOUS

## 2021-07-25 MED ORDER — ESMOLOL HCL 100 MG/10ML IV SOLN
INTRAVENOUS | Status: AC
  Filled 2021-07-25: qty 10

## 2021-07-25 MED ORDER — EPHEDRINE 5 MG/ML INJ
INTRAVENOUS | Status: AC
  Filled 2021-07-25: qty 15

## 2021-07-25 MED ORDER — LACTATED RINGERS IV SOLN
INTRAVENOUS | Status: DC
  Administered 2021-07-25: 1000 mL via INTRAVENOUS

## 2021-07-25 MED ORDER — PROPOFOL 500 MG/50ML IV EMUL
INTRAVENOUS | Status: AC
  Filled 2021-07-25: qty 150

## 2021-07-25 MED ORDER — LIDOCAINE HCL (CARDIAC) PF 100 MG/5ML IV SOSY
PREFILLED_SYRINGE | INTRAVENOUS | Status: DC | PRN
  Administered 2021-07-25: 60 mg via INTRATRACHEAL

## 2021-07-25 MED ORDER — GLUCAGON HCL RDNA (DIAGNOSTIC) 1 MG IJ SOLR
INTRAMUSCULAR | Status: AC
  Filled 2021-07-25: qty 1

## 2021-07-25 MED ORDER — PROPOFOL 10 MG/ML IV BOLUS
INTRAVENOUS | Status: DC | PRN
  Administered 2021-07-25: 100 mg via INTRAVENOUS

## 2021-07-25 MED ORDER — LIDOCAINE HCL (PF) 2 % IJ SOLN
INTRAMUSCULAR | Status: AC
  Filled 2021-07-25: qty 35

## 2021-07-25 NOTE — Discharge Instructions (Addendum)
You are being discharged to home.  Resume your previous diet.  We are waiting for your pathology results.  Your physician has recommended a repeat colonoscopy for surveillance based on pathology results.  Depending on pathology results will discuss approach with advanced endoscopist vs surgeon. Restart coumadin tonight.

## 2021-07-25 NOTE — Anesthesia Postprocedure Evaluation (Signed)
Anesthesia Post Note  Patient: Melissa Velazquez  Procedure(s) Performed: COLONOSCOPY WITH PROPOFOL BIOPSY POLYPECTOMY  Patient location during evaluation: Phase II Anesthesia Type: General Level of consciousness: awake and alert and oriented Pain management: pain level controlled Vital Signs Assessment: post-procedure vital signs reviewed and stable Respiratory status: spontaneous breathing, nonlabored ventilation and respiratory function stable Cardiovascular status: blood pressure returned to baseline and stable Postop Assessment: no apparent nausea or vomiting Anesthetic complications: no   No notable events documented.   Last Vitals:  Vitals:   07/25/21 1134 07/25/21 1229  BP: 137/85 104/60  Pulse: 73 67  Resp: 16 20  Temp: 36.7 C   SpO2: 99% 95%    Last Pain:  Vitals:   07/25/21 1229  TempSrc:   PainSc: 0-No pain                 Davidjames Blansett C Jaxx Huish

## 2021-07-25 NOTE — H&P (Addendum)
Melissa Velazquez is an 71 y.o. female.   Chief Complaint: rectal bleeding HPI: Melissa Velazquez is a 71 y.o. female with past medical history of IBS-M, A-fib, hypertension, kidney cancer s/p hypothyroidism, diabetes, asthma, CKD, endometrial cancer, who presents for evaluation of rectal bleeding.  Patient reports that she has presented persistent intermittent episodes of rectal bleeding which are painless.  No presence of abdominal pain, nausea, vomiting, fever or chills.  Past Medical History:  Diagnosis Date   Allergic rhinitis    Anal fissure    Hx of    Anxiety    hx of   Arthritis    Asthma    none in last year    Chronic kidney disease    partial nephrectomy   Depression    hx of   Diabetes mellitus without complication ([place])    no meds   Diverticular disease    Dysrhythmia    palpitations   Eczema    Endometrial cancer ([place]) 2001   spread to appendix    Hemorrhoids    Hyperglycemia    Hypertension    Hypothyroidism    Kidney cancer, primary, with metastasis from kidney to other site [facility])    Obesity    Sebaceous cyst    Thyroid disease    Thyroid nodule    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    Past Surgical History:  Procedure Laterality Date   APPENDECTOMY  2008   BIOPSY  12/14/2016   Procedure: BIOPSY;  Surgeon: Rogene Houston, MD;  [facility];  Service: Endoscopy;;  colon   CHOLECYSTECTOMY  2008   COLONOSCOPY N/A 11/24/2012   Procedure: COLONOSCOPY;  Surgeon: Rogene Houston, MD;  [facility];  Service: Endoscopy;  Laterality: N/A;  830   COLONOSCOPY N/A 12/14/2016   Procedure: COLONOSCOPY;  Surgeon: Rogene Houston, MD;  [facility];  Service: Endoscopy;  Laterality: N/A;  12:00   IR RADIOLOGIST EVAL & MGMT  06/23/2021   POLYPECTOMY  12/14/2016   Procedure: POLYPECTOMY;  Surgeon: Rogene Houston, MD;  [facility];  Service: Endoscopy;;  colon   ROBOTIC ASSITED PARTIAL NEPHRECTOMY Left 08/21/2016    Procedure: XI ROBOTIC ASSITED PARTIAL NEPHRECTOMY;  Surgeon: Ardis Hughs, MD;  [facility];  Service: Urology;  Laterality: Left;   TOTAL ABDOMINAL HYSTERECTOMY  2001   TOTAL KNEE ARTHROPLASTY Bilateral 01/24/2020   Procedure: TOTAL KNEE BILATERAL;  Surgeon: Gaynelle Arabian, MD;  [facility];  Service: Orthopedics;  Laterality: Bilateral;   WISDOM TOOTH EXTRACTION      Family History  Problem Relation Age of Onset   Breast cancer Mother    Diabetes Mother    Obesity Sister    Asthma Sister    Eczema Sister    COPD Father        smoked   Prostate cancer Father    Breast cancer Maternal Aunt    Urticaria Maternal Grandfather    Colon cancer Neg Hx    Social History:  reports that she quit smoking about 23 years ago. Her smoking use included cigarettes. She has a 20.00 pack-year smoking history. She has been exposed to tobacco smoke. She has never used smokeless tobacco. She reports current alcohol use. She reports that she does not use drugs.  Allergies:  Allergies  Allergen Reactions   Triple Antibiotic [Bacitracin-Neomycin-Polymyxin] Anaphylaxis   Levaquin [Levofloxacin] Other (See Comments)    Stomach upset   Oxycodone  Tremors and brain fog   Capsaicin Rash   Elastic Bandages & [Zinc] Rash   Nickel Itching and Rash    Medications Prior to Admission  Medication Sig Dispense Refill   acetaminophen (TYLENOL) 500 MG tablet Take 500 mg by mouth every 6 (six) hours as needed for moderate pain.     Calcium-Magnesium-Vitamin D (CALCIUM 1200+D3 PO) Take 1 tablet by mouth once a week.     cetirizine (ZYRTEC) 10 MG tablet Take 10 mg by mouth daily as needed for allergies.     Cholecalciferol (VITAMIN D3) 125 MCG (5000 UT) CAPS Take 5,000 Units by mouth daily.     fluticasone (FLONASE) 50 MCG/ACT nasal spray Place 1 spray into both nostrils 2 (two) times daily as needed for allergies or rhinitis. 16 g 5   levothyroxine (SYNTHROID, LEVOTHROID) 75 MCG tablet Take 75  mcg by mouth daily before breakfast.     metoprolol tartrate (LOPRESSOR) 25 MG tablet Take 0.5 tablets (12.5 mg total) by mouth as needed (for palpitations). 45 tablet 3   Na Sulfate-K Sulfate-Mg Sulf (SUPREP BOWEL PREP KIT) 17.5-3.13-1.6 GM/177ML SOLN Take 1 kit by mouth as directed. 354 mL 0   sertraline (ZOLOFT) 100 MG tablet Take 150 mg by mouth daily with breakfast.     triamcinolone cream (KENALOG) 0.1 % Apply 1 application topically daily as needed (rash).     warfarin (COUMADIN) 5 MG tablet Take 1 1/2 - 2 tablets daily as directed by coumadin clinic (Patient taking differently: Take 7.5-10 mg by mouth See admin instructions. Take 1 1/2 - 2 tablets daily as directed by coumadin clinic; 10 mg Tues Thurs and Sat, 7.5 mg all other days) 180 tablet 1   albuterol (PROVENTIL HFA;VENTOLIN HFA) 108 (90 Base) MCG/ACT inhaler Inhale 2 puffs into the lungs every 6 (six) hours as needed for wheezing or shortness of breath.     dicyclomine (BENTYL) 20 MG tablet Take 20 mg by mouth 3 (three) times daily as needed for spasms.      No results found for this or any previous visit (from the past 48 hour(s)). No results found.  Review of Systems  Constitutional: Negative.   HENT: Negative.    Eyes: Negative.   Respiratory: Negative.    Cardiovascular: Negative.   Gastrointestinal:  Positive for blood in stool.  Endocrine: Negative.   Genitourinary: Negative.   Musculoskeletal: Negative.   Skin: Negative.   Allergic/Immunologic: Negative.   Neurological: Negative.   Hematological: Negative.   Psychiatric/Behavioral: Negative.      Blood pressure 137/85, pulse 73, temperature 98.1 F (36.7 C), temperature source Oral, resp. rate 16, height $RemoveBe'5\' 4"'qlEhjisGg$  (1.626 m), weight 93 kg, SpO2 99 %. Physical Exam  GENERAL: The patient is AO x3, in no acute distress. HEENT: Head is normocephalic and atraumatic. EOMI are intact. Mouth is well hydrated and without lesions. NECK: Supple. No masses LUNGS: Clear to  auscultation. No presence of rhonchi/wheezing/rales. Adequate chest expansion HEART: RRR, normal s1 and s2. ABDOMEN: Soft, nontender, no guarding, no peritoneal signs, and nondistended. BS +. No masses. EXTREMITIES: Without any cyanosis, clubbing, rash, lesions or edema. NEUROLOGIC: AOx3, no focal motor deficit. SKIN: no jaundice, no rashes  Assessment/Plan ELENORE WANNINGER is a 71 y.o. female with past medical history of IBS-M, A-fib, hypertension, kidney cancer s/p hypothyroidism, diabetes, asthma, CKD, endometrial cancer, who presents for evaluation of rectal bleeding.  We will proceed with colonoscopy.  Harvel Quale, MD 07/25/2021, 11:55 AM

## 2021-07-25 NOTE — Op Note (Signed)
[facility] Patient Name: Melissa Velazquez Procedure Date: 07/25/2021 11:51 AM MRN: [id number] Date of Birth: Jan 02, 1951 Attending MD: Maylon Peppers ,  CSN: [id number] Age: 71 Admit Type: Outpatient Procedure:                Colonoscopy Indications:              - piecemeal polypectomy in periappendiceal polyp,                            Rectal bleeding Providers:                Maylon Peppers, Charlsie Quest. Theda Sers RN, RN, Raphael Gibney, Technician Referring MD:              Medicines:                Monitored Anesthesia Care Complications:            No immediate complications. Estimated Blood Loss:     Estimated blood loss: none. Procedure:                Pre-Anesthesia Assessment:                           - Prior to the procedure, a History and Physical                            was performed, and patient medications, allergies                            and sensitivities were reviewed. The patient's                            tolerance of previous anesthesia was reviewed.                           - The risks and benefits of the procedure and the                            sedation options and risks were discussed with the                            patient. All questions were answered and informed                            consent was obtained.                           - ASA Grade Assessment: II - A patient with mild                            systemic disease.                           After obtaining informed consent, the colonoscope  was passed under direct vision. Throughout the                            procedure, the patient's blood pressure, pulse, and                            oxygen saturations were monitored continuously. The                            [id number]-HQ190L ([id number]) scope was introduced through                            the anus and advanced to the the cecum, identified                            by  appendiceal orifice and ileocecal valve. The                            colonoscopy was performed without difficulty. The                            patient tolerated the procedure well. The quality                            of the bowel preparation was good. Scope In: 12:03:50 PM Scope Out: 12:25:19 PM Scope Withdrawal Time: 0 hours 16 minutes 20 seconds  Total Procedure Duration: 0 hours 21 minutes 29 seconds  Findings:      The perianal and digital rectal examinations were normal.      A 30 mm polyp was found in the cecum. The polyp was in proximity to the       appendiceal orifice and it did not appear to be infiltrating the       appendiceal oryfice. This corresponded to a Paris classification 0-IIa.       The polyp was carpet-like. There was presence of superficial focal       ulceration. Imaging was performed using zoom magnification and narrow       band imaging to visualize the mucosa. Biopsies were taken with a cold       forceps for histology at the edges of the ulceration.      Three sessile polyps were found in the descending colon and ascending       colon. The polyps were 3 to 5 mm in size. These polyps were removed with       a cold snare. Resection and retrieval were complete.      Scattered small and large-mouthed diverticula were found in the sigmoid       colon, descending colon and transverse colon.      Non-bleeding external and internal hemorrhoids were found during       retroflexion. The hemorrhoids were small.      Note: Rectal bleeding likely related to hemorrhoidal bleeding, but       ulceration in polyp may have also contributted to it. Impression:               - One 30 mm polyp in the cecum in the pericecal  area. Biopsied.                           - Three 3 to 5 mm polyps in the descending colon                            and in the ascending colon, removed with a cold                            snare. Resected and retrieved.                            - Diverticulosis in the sigmoid colon, in the                            descending colon and in the transverse colon.                           - Non-bleeding external and internal hemorrhoids. Moderate Sedation:      Per Anesthesia Care Recommendation:           - Discharge patient to home (ambulatory).                           - Resume previous diet.                           - Await pathology results.                           - Repeat colonoscopy for surveillance based on                            pathology results.                           - Depending on pathology results will discuss                            approach with advanced endoscopist vs surgeon. Procedure Code(s):        --- Professional ---                           [phone], Colonoscopy, flexible; with removal of                            tumor(s), polyp(s), or other lesion(s) by snare                            technique                           45380, 59, Colonoscopy, flexible; with biopsy,                            single or multiple Diagnosis Code(s):        ---  Professional ---                           K63.5, Polyp of colon                           K64.8, Other hemorrhoids                           K62.5, Hemorrhage of anus and rectum                           K57.30, Diverticulosis of large intestine without                            perforation or abscess without bleeding CPT copyright 2019 American Medical Association. All rights reserved. The codes documented in this report are preliminary and upon coder review may  be revised to meet current compliance requirements. Maylon Peppers, MD Maylon Peppers,  07/25/2021 12:38:31 PM This report has been signed electronically. Number of Addenda: 0

## 2021-07-25 NOTE — Anesthesia Preprocedure Evaluation (Signed)
Anesthesia Evaluation  Patient identified by MRN, date of birth, ID band Patient awake    Reviewed: Allergy & Precautions, NPO status , Patient's Chart, lab work & pertinent test results  Airway Mallampati: II  TM Distance: >3 FB Neck ROM: Full    Dental  (+) Dental Advisory Given No notable dental injury:   Pulmonary asthma , former smoker,    Pulmonary exam normal breath sounds clear to auscultation       Cardiovascular Exercise Tolerance: Good hypertension, Normal cardiovascular exam+ dysrhythmias Atrial Fibrillation  Rhythm:Regular Rate:Normal  1. Left ventricular ejection fraction, by estimation, is 60 to 65%. The left ventricle has normal function. The left ventricle has no regional wall motion abnormalities. There is mild concentric left ventricular  hypertrophy and moderate basal septal hypertrophy.  2. Right ventricular systolic function is mildly reduced. The right ventricular size is normal. There is moderately elevated pulmonary artery systolic pressure. The estimated right ventricular systolic pressure is  60.6 mmHg.  3. Large pleural effusion in the left lateral region.  4. The mitral valve is normal in structure. No evidence of mitral valve regurgitation. No evidence of mitral stenosis.  5. The aortic valve is tricuspid. Aortic valve regurgitation is not visualized. Mild aortic valve sclerosis is present, with no evidence of aortic valve stenosis. Aortic valve area, by VTI measures 2.41 cm. Aortic  valve mean gradient measures 4.0 mmHg.  Aortic valve Vmax measures 1.51 m/s.  6. The inferior vena cava is dilated in size with <50% respiratory variability, suggesting right atrial pressure of 15 mmHg.  7. Tricuspid valve regurgitation is mild to moderate.   FINDINGS    Neuro/Psych PSYCHIATRIC DISORDERS Anxiety Depression negative neurological ROS     GI/Hepatic Neg liver ROS, GERD  Medicated,  Endo/Other   diabetes, Well Controlled, Type 2Hypothyroidism   Renal/GU Renal disease (kidney cancer, partial nephrectomy)  negative genitourinary   Musculoskeletal  (+) Arthritis , Osteoarthritis,    Abdominal   Peds negative pediatric ROS (+)  Hematology  (+) Blood dyscrasia, anemia ,   Anesthesia Other Findings   Reproductive/Obstetrics negative OB ROS                            Anesthesia Physical Anesthesia Plan  ASA: 3  Anesthesia Plan: General   Post-op Pain Management: Minimal or no pain anticipated   Induction: Intravenous  PONV Risk Score and Plan: Propofol infusion  Airway Management Planned: Nasal Cannula and Natural Airway  Additional Equipment:   Intra-op Plan:   Post-operative Plan:   Informed Consent: I have reviewed the patients History and Physical, chart, labs and discussed the procedure including the risks, benefits and alternatives for the proposed anesthesia with the patient or authorized representative who has indicated his/her understanding and acceptance.     Dental advisory given  Plan Discussed with: CRNA and Surgeon  Anesthesia Plan Comments:         Anesthesia Quick Evaluation

## 2021-07-25 NOTE — Transfer of Care (Signed)
Immediate Anesthesia Transfer of Care Note  Patient: Melissa Velazquez  Procedure(s) Performed: COLONOSCOPY WITH PROPOFOL BIOPSY POLYPECTOMY  Patient Location: Short Stay  Anesthesia Type:General  Level of Consciousness: sedated  Airway & Oxygen Therapy: Patient Spontanous Breathing  Post-op Assessment: Report given to RN and Post -op Vital signs reviewed and stable  Post vital signs: Reviewed and stable  Last Vitals:  Vitals Value Taken Time  BP    Temp    Pulse    Resp    SpO2      Last Pain:  Vitals:   07/25/21 1201  TempSrc:   PainSc: 0-No pain      Patients Stated Pain Goal: 5 (15/72/62 0355)  Complications: No notable events documented.

## 2021-07-28 ENCOUNTER — Encounter (HOSPITAL_COMMUNITY)
Admission: RE | Admit: 2021-07-28 | Discharge: 2021-07-28 | Disposition: A | Discharge status: Home / Self Care | Payer: Medicare Other | Source: Ambulatory Visit | Attending: Interventional Radiology | Admitting: Interventional Radiology

## 2021-07-28 ENCOUNTER — Telehealth: Payer: Self-pay | Admitting: Genetic Counselor

## 2021-07-28 ENCOUNTER — Other Ambulatory Visit: Payer: Self-pay

## 2021-07-28 ENCOUNTER — Ambulatory Visit (HOSPITAL_COMMUNITY)
Admission: RE | Admit: 2021-07-28 | Discharge: 2021-07-28 | Disposition: A | Discharge status: Home / Self Care | Payer: Medicare Other | Source: Ambulatory Visit | Attending: Radiology | Admitting: Radiology

## 2021-07-28 ENCOUNTER — Encounter (INDEPENDENT_AMBULATORY_CARE_PROVIDER_SITE_OTHER): Payer: Self-pay | Admitting: *Deleted

## 2021-07-28 ENCOUNTER — Encounter (HOSPITAL_COMMUNITY): Payer: Self-pay

## 2021-07-28 DIAGNOSIS — N2889 Other specified disorders of kidney and ureter: Secondary | ICD-10-CM | POA: Insufficient documentation

## 2021-07-28 DIAGNOSIS — R918 Other nonspecific abnormal finding of lung field: Secondary | ICD-10-CM | POA: Diagnosis not present

## 2021-07-28 DIAGNOSIS — Z01818 Encounter for other preprocedural examination: Secondary | ICD-10-CM | POA: Insufficient documentation

## 2021-07-28 LAB — CBC WITH DIFFERENTIAL/PLATELET
Abs Immature Granulocytes: 0.02 10*3/uL (ref 0.00–0.07)
Basophils Absolute: 0 10*3/uL (ref 0.0–0.1)
Basophils Relative: 0 %
Eosinophils Absolute: 0.3 10*3/uL (ref 0.0–0.5)
Eosinophils Relative: 5 %
HCT: 43.4 % (ref 36.0–46.0)
Hemoglobin: 14.5 g/dL (ref 12.0–15.0)
Immature Granulocytes: 0 %
Lymphocytes Relative: 25 %
Lymphs Abs: 1.1 10*3/uL (ref 0.7–4.0)
MCH: 31.9 pg (ref 26.0–34.0)
MCHC: 33.4 g/dL (ref 30.0–36.0)
MCV: 95.6 fL (ref 80.0–100.0)
Monocytes Absolute: 0.4 10*3/uL (ref 0.1–1.0)
Monocytes Relative: 8 %
Neutro Abs: 2.8 10*3/uL (ref 1.7–7.7)
Neutrophils Relative %: 62 %
Platelets: 178 10*3/uL (ref 150–400)
RBC: 4.54 MIL/uL (ref 3.87–5.11)
RDW: 13.2 % (ref 11.5–15.5)
WBC: 4.6 10*3/uL (ref 4.0–10.5)
nRBC: 0 % (ref 0.0–0.2)

## 2021-07-28 LAB — PROTIME-INR
INR: 1.2 (ref 0.8–1.2)
Prothrombin Time: 15 seconds (ref 11.4–15.2)

## 2021-07-28 LAB — BASIC METABOLIC PANEL
Anion gap: 7 (ref 5–15)
BUN: 14 mg/dL (ref 8–23)
CO2: 28 mmol/L (ref 22–32)
Calcium: 9.6 mg/dL (ref 8.9–10.3)
Chloride: 108 mmol/L (ref 98–111)
Creatinine, Ser: 1.03 mg/dL — ABNORMAL HIGH (ref 0.44–1.00)
GFR, Estimated: 58 mL/min — ABNORMAL LOW (ref >60)
Glucose, Bld: 114 mg/dL — ABNORMAL HIGH (ref 70–99)
Potassium: 4.5 mmol/L (ref 3.5–5.1)
Sodium: 143 mmol/L (ref 135–145)

## 2021-07-28 LAB — SURGICAL PATHOLOGY

## 2021-07-28 NOTE — Telephone Encounter (Signed)
Scheduled appt per 7/10 referral. Pt is aware of appt date and time. Pt is aware to arrive 15 mins prior to appt time and to bring and updated insurance card. Pt is aware of appt location.

## 2021-07-28 NOTE — Patient Instructions (Signed)
DUE TO COVID-19 ONLY TWO VISITORS  (aged 70 and older)  ARE ALLOWED TO COME WITH YOU AND STAY IN THE WAITING ROOM ONLY DURING PRE OP AND PROCEDURE.   **NO VISITORS ARE ALLOWED IN THE SHORT STAY AREA OR RECOVERY ROOM!!**  IF YOU WILL BE ADMITTED INTO THE HOSPITAL YOU ARE ALLOWED ONLY FOUR SUPPORT PEOPLE DURING VISITATION HOURS ONLY (7 AM -8PM)   The support person(s) must pass our screening, gel in and out, and wear a mask at all times, including in the patient's room. Patients must also wear a mask when staff or their support person are in the room. Visitors GUEST BADGE MUST BE WORN VISIBLY  One adult visitor may remain with you overnight and MUST be in the room by 8 P.M.     Your procedure is scheduled on: 08/06/21   Report to [facility] Main Entrance    Report to admitting at  9:45 AM   Call this number if you have problems the morning of surgery [phone]   Do not eat food  or drink:After Midnight.      Before surgery.Stop taking ___________on __________as instructed by _____________.  Stop taking ____________as directed by your Surgeon/Cardiologist.  Contact your Surgeon/Cardiologist for instructions on Anticoagulant Therapy prior to surgery.                        If you have questions, please contact your surgeon's office.   FOLLOW BOWEL PREP AND ANY ADDITIONAL PRE OP INSTRUCTIONS YOU RECEIVED FROM YOUR SURGEON'S OFFICE!!!     Oral Hygiene is also important to reduce your risk of infection.                                    Remember - BRUSH YOUR TEETH THE MORNING OF SURGERY WITH YOUR REGULAR TOOTHPASTE   Do NOT smoke after Midnight   Take these medicines the morning of surgery with A SIP OF WATER: Zoloft, Levothyroxine, Metoprolol, Zyrtec           Use your inhaler and bring it with you  DO NOT TAKE ANY ORAL DIABETIC MEDICATIONS DAY OF YOUR SURGERY  Bring CPAP mask and tubing day of surgery.                              You may not have any metal on  your body including hair pins, jewelry, and body piercing             Do not wear make-up, lotions, powders, perfumes/cologne, or deodorant  Do not wear nail polish including gel and S&S, artificial/acrylic nails, or any other type of covering on natural nails including finger and toenails. If you have artificial nails, gel coating, etc. that needs to be removed by a nail salon please have this removed prior to surgery or surgery may need to be canceled/ delayed if the surgeon/ anesthesia feels like they are unable to be safely monitored.   Do not shave  48 hours prior to surgery.     Do not bring valuables to the hospital. [place].   Contacts, dentures or bridgework may not be worn into surgery.   Bring small overnight bag day of surgery.   DO NOT  [place]. PHARMACY WILL DISPENSE MEDICATIONS LISTED ON YOUR MEDICATION LIST TO YOU DURING YOUR ADMISSION [place]!       Special Instructions: Bring a copy of your healthcare power of attorney and living will documents the day of surgery if you haven't scanned them before.              Please read over the following fact sheets you were given: IF YOU HAVE QUESTIONS ABOUT YOUR PRE-OP INSTRUCTIONS PLEASE CALL [phone]     [facility] - Preparing for Surgery Before surgery, you can play an important role.  Because skin is not sterile, your skin needs to be as free of germs as possible.  You can reduce the number of germs on your skin by washing with CHG (chlorahexidine gluconate) soap before surgery.  CHG is an antiseptic cleaner which kills germs and bonds with the skin to continue killing germs even after washing. Please DO NOT use if you have an allergy to CHG or antibacterial soaps.  If your skin becomes reddened/irritated stop using the CHG and inform your nurse when you arrive at Short Stay. Do not shave (including legs and underarms) for at  least 48 hours prior to the first CHG shower.   Please follow these instructions carefully:  1.  Shower with CHG Soap the night before surgery and the  morning of Surgery.  2.  If you choose to wash your hair, wash your hair first as usual with your  normal  shampoo.  3.  After you shampoo, rinse your hair and body thoroughly to remove the  shampoo.                            4.  Use CHG as you would any other liquid soap.  You can apply chg directly  to the skin and wash                       Gently with a scrungie or clean washcloth.  5.  Apply the CHG Soap to your body ONLY FROM THE NECK DOWN.   Do not use on face/ open                           Wound or open sores. Avoid contact with eyes, ears mouth and genitals (private parts).                       Wash face,  Genitals (private parts) with your normal soap.             6.  Wash thoroughly, paying special attention to the area where your surgery  will be performed.  7.  Thoroughly rinse your body with warm water from the neck down.  8.  DO NOT shower/wash with your normal soap after using and rinsing off  the CHG Soap.                9.  Pat yourself dry with a clean towel.            10.  Wear clean pajamas.            11.  Place clean sheets on your bed the night of your first shower and do not  sleep with pets. Day of Surgery : Do not apply any lotions/deodorants the morning  of surgery.  Please wear clean clothes to the hospital/surgery center.  FAILURE TO FOLLOW THESE INSTRUCTIONS MAY RESULT IN THE CANCELLATION OF YOUR SURGERY    ________________________________________________________________________

## 2021-07-28 NOTE — Telephone Encounter (Signed)
Called pt to move genetics appt to an earlier date per 7/10 staff msg from [facility]. No answer. Left msg to have pt call back to r/s appt.

## 2021-07-28 NOTE — Progress Notes (Signed)
Anesthesia note:  Bowel prep reminder:NA  PCP - Dr. Eddie Candle Cardiologist -Dr. Zandra Abts Other-   Chest x-ray - no EKG - 05/16/21-epic Stress Test - no ECHO - 01/29/20-epic Cardiac Cath - NA  Pacemaker/ICD device last checked:NA  Sleep Study - no CPAP -   Pt is pre diabetic- Fasting Blood Sugar -  Checks Blood Sugar _____  Blood Thinner:Coumadin/ Dr. Harl Bowie Blood Thinner Instructions:stop 5 days prior to DOS/ Dr. Harl Bowie Aspirin Instructions: Last Dose:08/01/21  Anesthesia review: yes  Patient denies shortness of breath, fever, cough and chest pain at PAT appointment Pt reports no SOB with activities.  Patient verbalized understanding of instructions that were given to them at the PAT appointment. Patient was also instructed that they will need to review over the PAT instructions again at home before surgery. yes

## 2021-07-29 ENCOUNTER — Telehealth: Payer: Self-pay | Admitting: Genetic Counselor

## 2021-07-29 ENCOUNTER — Other Ambulatory Visit (INDEPENDENT_AMBULATORY_CARE_PROVIDER_SITE_OTHER): Payer: Self-pay

## 2021-07-29 DIAGNOSIS — C189 Malignant neoplasm of colon, unspecified: Secondary | ICD-10-CM

## 2021-07-29 NOTE — Telephone Encounter (Signed)
R/s pt's genetics appt per 7/10 staff msg from [facility]. Pt is aware of new appt date/time.

## 2021-07-31 ENCOUNTER — Encounter (HOSPITAL_COMMUNITY): Payer: Self-pay | Admitting: Gastroenterology

## 2021-08-01 DIAGNOSIS — C642 Malignant neoplasm of left kidney, except renal pelvis: Secondary | ICD-10-CM | POA: Diagnosis not present

## 2021-08-04 ENCOUNTER — Inpatient Hospital Stay: Payer: Medicare Other | Attending: Internal Medicine | Admitting: Licensed Clinical Social Worker

## 2021-08-04 ENCOUNTER — Other Ambulatory Visit: Payer: Self-pay

## 2021-08-04 ENCOUNTER — Inpatient Hospital Stay: Payer: Medicare Other

## 2021-08-04 DIAGNOSIS — Z85528 Personal history of other malignant neoplasm of kidney: Secondary | ICD-10-CM | POA: Diagnosis not present

## 2021-08-04 DIAGNOSIS — C180 Malignant neoplasm of cecum: Secondary | ICD-10-CM

## 2021-08-04 DIAGNOSIS — Z8542 Personal history of malignant neoplasm of other parts of uterus: Secondary | ICD-10-CM | POA: Diagnosis not present

## 2021-08-04 DIAGNOSIS — Z8042 Family history of malignant neoplasm of prostate: Secondary | ICD-10-CM | POA: Diagnosis not present

## 2021-08-04 DIAGNOSIS — Z803 Family history of malignant neoplasm of breast: Secondary | ICD-10-CM

## 2021-08-04 LAB — GENETIC SCREENING ORDER

## 2021-08-04 NOTE — Progress Notes (Signed)
REFERRING PROVIDER: Harvel Quale, [address] [place],  [place] [address]  PRIMARY PROVIDER:  [name], MD  PRIMARY REASON FOR VISIT:  1. History of uterine cancer   2. History of kidney cancer   3. Cecal cancer ([place])   4. Family history of breast cancer   5. Family history of prostate cancer      HISTORY OF PRESENT ILLNESS:   Ms. Wedekind, a 71 y.o. female, was seen for a [facility] cancer genetics consultation at the request of Dr. Montez Morita due to a personal and family history of cancer.  Ms. Behrmann presents to clinic today to discuss the possibility of a hereditary predisposition to cancer, genetic testing, and to further clarify her future cancer risks, as well as potential cancer risks for family members.   At the age of 32, Ms. Mettler had endometrial cancer that was treated with total hysterectomy. She had radiation for a recurrence 8 years later.  At the age of 24, Ms. Hoelting had kidney cancer that was treated with nephrectomy. At the age of 83, Ms. Loiselle was found to have adenocarcinoma in a cecal mass biopsy. Her treatment plan is still being determined.   CANCER HISTORY:  Oncology History   No history exists.     RISK FACTORS:  Menarche was at age 86.  OCP use for approximately 0 years.  Ovaries intact: no.  Hysterectomy: yes.  Menopausal status: postmenopausal.  HRT use: 1 years. Colonoscopy: yes;  hx approximately 10 polyps . Mammogram within the last year: yes. Number of breast biopsies: 0.   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    Past Surgical History:  Procedure Laterality Date   APPENDECTOMY  01/19/2006   BIOPSY  12/14/2016   Procedure: BIOPSY;  Surgeon: Rogene Houston, MD;  [facility];  Service: Endoscopy;;  colon   BIOPSY  07/25/2021   Procedure: BIOPSY;  Surgeon: Harvel Quale, MD;  [facility];  Service: Gastroenterology;;   CHOLECYSTECTOMY  01/19/2006   COLONOSCOPY N/A 11/24/2012   Procedure: COLONOSCOPY;  Surgeon: Rogene Houston, MD;  [facility];  Service: Endoscopy;  Laterality: N/A;  830   COLONOSCOPY N/A 12/14/2016   Procedure: COLONOSCOPY;  Surgeon: Rogene Houston, MD;  [facility];  Service: Endoscopy;  Laterality: N/A;  12:00   COLONOSCOPY  07/25/2021   Bx of polyp CA +   COLONOSCOPY WITH PROPOFOL N/A 07/25/2021   Procedure: COLONOSCOPY WITH PROPOFOL;  Surgeon: Harvel Quale, MD;  [facility];  Service: Gastroenterology;  Laterality: N/A;  115 ASA 1   IR RADIOLOGIST EVAL &  MGMT  06/23/2021   POLYPECTOMY  12/14/2016   Procedure: POLYPECTOMY;  Surgeon: Rogene Houston, MD;  [facility];  Service: Endoscopy;;  colon   POLYPECTOMY  07/25/2021   Procedure: POLYPECTOMY;  Surgeon: Harvel Quale, MD;  [facility];  Service: Gastroenterology;;   ROBOTIC ASSITED PARTIAL NEPHRECTOMY Left 08/21/2016   Procedure: XI ROBOTIC ASSITED PARTIAL NEPHRECTOMY;  Surgeon: Ardis Hughs, MD;  [facility];  Service: Urology;  Laterality: Left;   TOTAL ABDOMINAL HYSTERECTOMY  01/20/1999   TOTAL KNEE ARTHROPLASTY Bilateral 01/24/2020   Procedure: TOTAL KNEE BILATERAL;  Surgeon: Gaynelle Arabian, MD;  [facility];  Service: Orthopedics;  Laterality: Bilateral;   WISDOM TOOTH EXTRACTION     FAMILY  HISTORY:  We obtained a detailed, 4-generation family history.  Significant diagnoses are listed below: Family History  Problem Relation Age of Onset   Breast cancer Mother    Diabetes Mother    Obesity Sister    Asthma Sister    Eczema Sister    COPD Father        smoked   Prostate cancer Father    Breast cancer Maternal Aunt    Urticaria Maternal Grandfather    Colon cancer Neg Hx    Ms. Algeo has 1 sister, 30, with no history of cancer.   Ms. Better mother had breast cancer in her 31s-80s and passed at 20. Patient has a maternal aunt who also had breast cancer in her 57s. Maternal grandmother had liver cancer in her 55s.  Ms. Mishkin father had prostate cancer at 38 and passed at 53. Paternal cousin had thyroid cancer, another cousin had unknown cancer.   Ms. Barcus is unaware of previous family history of genetic testing for hereditary cancer risks. There is no reported Ashkenazi Jewish ancestry. There is no known consanguinity.    GENETIC COUNSELING ASSESSMENT: Ms. Kuri is a 71 y.o. female with a personal and family history of cancer which is somewhat suggestive of a hereditary cancer syndrome and predisposition to cancer. We, therefore, discussed and recommended the following at today's visit.   DISCUSSION: We discussed that approximately 10% of  cancer is hereditary. Most cases of hereditary uterine/colorectal cancer are associated with Lynch syndrome genes, although there are other genes associated with hereditary cancer as well. Cancers and risks are gene specific. We discussed that testing is beneficial for several reasons including knowing about cancer risks, identifying potential screening and risk-reduction options that may be appropriate, and to understand if other family members could be at risk for cancer and allow them to undergo genetic testing.   We reviewed the characteristics, features and inheritance patterns of hereditary cancer syndromes. We also  discussed genetic testing, including the appropriate family members to test, the process of testing, insurance coverage and turn-around-time for results. We discussed the implications of a negative, positive and/or variant of uncertain significant result. We recommended Ms. Vankirk pursue genetic testing for the [facility] Multi-Cancer+RNA gene panel.   Based on Ms. Tuohey's personal and family history of cancer, she meets medical criteria for genetic testing. Despite that she meets criteria, she may still have an out of pocket cost. We discussed that if her out of pocket cost for testing is over $100, the laboratory will call and confirm whether she wants to proceed with testing.  If the out of pocket cost of testing is less than $100 she will be billed by the genetic testing laboratory.   PLAN: After considering the risks,  benefits, and limitations, Ms. Narasimhan provided informed consent to pursue genetic testing and the blood sample was sent to [facility] for analysis of the Multi-Cancer+RNA panel. Results should be available within approximately 2-3 weeks' time, at which point they will be disclosed by telephone to Ms. Zabawa, as will any additional recommendations warranted by these results. Ms. Lashomb will receive a summary of her genetic counseling visit and a copy of her results once available. This information will also be available in Epic.   Ms. Stratmann questions were answered to her satisfaction today. Our contact information was provided should additional questions or concerns arise. Thank you for the referral and allowing [place] to share in the care of your patient.   Faith Rogue, MS, [facility] Genetic Counselor [email or web address] Phone: [phone]  The patient was seen for a total of 30 minutes in face-to-face genetic counseling.  Dr. Grayland Ormond was available for discussion regarding this case.    _______________________________________________________________________ For Office Staff:  Number of people involved in session: 1 Was an Intern/ student involved with case: no

## 2021-08-05 ENCOUNTER — Other Ambulatory Visit: Payer: Self-pay | Admitting: Student

## 2021-08-05 ENCOUNTER — Inpatient Hospital Stay (HOSPITAL_COMMUNITY): Payer: Medicare Other | Attending: Hematology | Admitting: Hematology

## 2021-08-05 VITALS — BP 135/85 | HR 67 | Temp 97.8°F | Resp 18 | Ht 64.0 in | Wt 207.0 lb

## 2021-08-05 DIAGNOSIS — K639 Disease of intestine, unspecified: Secondary | ICD-10-CM | POA: Insufficient documentation

## 2021-08-05 DIAGNOSIS — Z85528 Personal history of other malignant neoplasm of kidney: Secondary | ICD-10-CM | POA: Insufficient documentation

## 2021-08-05 DIAGNOSIS — Z8542 Personal history of malignant neoplasm of other parts of uterus: Secondary | ICD-10-CM | POA: Diagnosis not present

## 2021-08-05 DIAGNOSIS — C180 Malignant neoplasm of cecum: Secondary | ICD-10-CM

## 2021-08-05 DIAGNOSIS — N2889 Other specified disorders of kidney and ureter: Secondary | ICD-10-CM

## 2021-08-05 NOTE — Patient Instructions (Addendum)
[place] at [facility] Discharge Instructions  You were seen and examined today by Dr. Delton Coombes. Dr. Delton Coombes is a medical oncologist, meaning that he specializes in the treatment of cancer diagnoses. Dr. Delton Coombes discussed your past medical history, family history of cancers, and the events that led to you being here today.  You were referred to Dr. Delton Coombes by Dr. Jenetta Downer due to an abnormal colonoscopy which revealed adenocarcinoma within your cecum, which is a section of your colon. It appears that the cancer has arisen in a polyp, but it unclear if it has invaded the wall of the colon.  Dr. Delton Coombes has recommended proceeding with the CT scan of your chest, abdomen and pelvis as scheduled.  You should also meet with a general surgeon to discuss the option of surgically removing the colon cancer. You will be referred to a surgeon of your choice.  Please follow-up as scheduled with Dr. Delton Coombes.  Thank you for choosing [place] at [facility] to provide your oncology and hematology care.  To afford each patient quality time with our provider, please arrive at least 15 minutes before your scheduled appointment time.   If you have a lab appointment with the [facility] please come in thru the Main Entrance and check in at the main information desk.  You need to re-schedule your appointment should you arrive 10 or more minutes late.  We strive to give you quality time with our providers, and arriving late affects you and other patients whose appointments are after yours.  Also, if you no show three or more times for appointments you may be dismissed from the clinic at the providers discretion.     Again, thank you for choosing [facility].  Our hope is that these requests will decrease the amount of time that you wait before being seen by our physicians.        _____________________________________________________________  Should you have questions after your visit to [facility], please contact our office at [phone] and follow the prompts.  Our office hours are 8:00 a.m. and 4:30 p.m. Monday - Friday.  Please note that voicemails left after 4:00 p.m. may not be returned until the following business day.  We are closed weekends and major holidays.  You do have access to a nurse 24-7, just call the main number to the clinic [phone] and do not press any options, hold on the line and a nurse will answer the phone.    For prescription refill requests, have your pharmacy contact our office and allow 72 hours.

## 2021-08-05 NOTE — Progress Notes (Signed)
[name] [address], [place] [address]   CLINIC:  Medical Oncology/Hematology  CONSULT NOTE  Patient Care Team: [name], MD as PCP - General (Family Medicine) Harl Bowie, Alphonse Guild, MD as PCP - Cardiology (Cardiology) Vickie Epley, MD as PCP - Electrophysiology (Cardiology) Derek Jack, MD as Medical Oncologist (Medical Oncology) Brien Mates, RN as Oncology Nurse Navigator (Medical Oncology)  CHIEF COMPLAINTS/PURPOSE OF CONSULTATION:  Evaluation for cecal mass and history of renal cell carcinoma   HISTORY OF PRESENTING ILLNESS:  [name] 71 y.o. female is here because of evaluation for cecal mass and history of renal cell carcinoma, at the request of Dr. Jenetta Downer.  Today she reports feeling good. She reports she has had hematochezia for the past 2-3 months. She underwent colonoscopy on 07/25/2021. She has a history of endometrial cancer in 2001 treated with hysterectomy which recurred in her appendix in 2008 at which time her appendix and a lymph node were removed. She also has a history of renal cell carcinoma for which she underwent partial nephrectomy in 2018. She denies weight loss in the past 6 months. She denies abdominal pain. She denies history of MI and CVA. She reports RLQ abdominal soreness since her colonoscopy.   She lives at home on her own, and she is able to do all of her typical daily activities. 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 Her father had prostate cancer, her mother and a maternal aunt had breast cancer, her paternal first cousin had thyroid cancer at 52-18 years old, her maternal grandmother had liver cancer, and another paternal first cousin died of cancer of an unknown type. She quit smoking in 2000 after smoking 1 ppd for 20 years.   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    SURGICAL HISTORY: Past Surgical History:  Procedure Laterality Date   APPENDECTOMY  01/19/2006   BIOPSY  12/14/2016   Procedure: BIOPSY;  Surgeon: Rogene Houston, MD;  [facility];  Service: Endoscopy;;  colon   BIOPSY  07/25/2021   Procedure: BIOPSY;  Surgeon: Harvel Quale, MD;  [facility];  Service: Gastroenterology;;   CHOLECYSTECTOMY  01/19/2006   COLONOSCOPY N/A 11/24/2012   Procedure: COLONOSCOPY;  Surgeon: Rogene Houston, MD;  [facility];  Service: Endoscopy;  Laterality: N/A;  830   COLONOSCOPY N/A 12/14/2016   Procedure: COLONOSCOPY;  Surgeon: Rogene Houston, MD;  [facility];  Service: Endoscopy;  Laterality: N/A;  12:00   COLONOSCOPY  07/25/2021   Bx of polyp CA +   COLONOSCOPY WITH  PROPOFOL N/A 07/25/2021   Procedure: COLONOSCOPY WITH PROPOFOL;  Surgeon: Harvel Quale, MD;  [facility];  Service: Gastroenterology;  Laterality: N/A;  115 ASA 1   IR RADIOLOGIST EVAL & MGMT  06/23/2021   POLYPECTOMY  12/14/2016   Procedure: POLYPECTOMY;  Surgeon: Rogene Houston, MD;  [facility];  Service: Endoscopy;;  colon   POLYPECTOMY  07/25/2021   Procedure: POLYPECTOMY;  Surgeon: Harvel Quale, MD;  [facility];  Service: Gastroenterology;;   ROBOTIC ASSITED PARTIAL NEPHRECTOMY Left 08/21/2016   Procedure: XI ROBOTIC ASSITED PARTIAL NEPHRECTOMY;  Surgeon: Ardis Hughs, MD;  [facility];  Service: Urology;  Laterality: Left;   TOTAL ABDOMINAL HYSTERECTOMY  01/20/1999   TOTAL KNEE ARTHROPLASTY Bilateral 01/24/2020   Procedure: TOTAL KNEE BILATERAL;  Surgeon: Gaynelle Arabian, MD;  [facility];  Service: Orthopedics;  Laterality: Bilateral;   WISDOM TOOTH EXTRACTION      SOCIAL HISTORY: Social History   Socioeconomic History   Marital status: Single    Spouse name: Not on file   Number of children: 0   Years of education: Not on file   Highest education level: Not on file  Occupational History   Occupation: Psychologist  Tobacco Use   Smoking status: Former    Packs/day: 1.00    Years: 20.00    Total pack years: 20.00    Types: Cigarettes    Quit date: 01/19/1998    Years since quitting: 23.5    Passive exposure: Past   Smokeless tobacco: Never  Vaping Use   Vaping Use: Never used  Substance and Sexual Activity   Alcohol use: Yes    Alcohol/week: 0.0 standard drinks of alcohol    Comment: rare   Drug use: No   Sexual activity: Not Currently  Other Topics Concern   Not on file  Social History Narrative   2 cups of caffeine daily    Social Determinants of Health   Financial Resource Strain: Not on file  Food Insecurity: Not on file  Transportation Needs: Not on file  Physical Activity: Not on file  Stress: Not on file  Social Connections: Not on file  Intimate Partner Violence: Not on file    FAMILY HISTORY: Family History  Problem Relation Age of Onset   Breast cancer Mother    Diabetes Mother    Obesity Sister    Asthma Sister    Eczema Sister    COPD Father        smoked   Prostate cancer Father    Breast cancer Maternal Aunt    Urticaria Maternal Grandfather    Colon cancer Neg Hx     ALLERGIES:  is allergic to triple antibiotic [bacitracin-neomycin-polymyxin], levaquin [levofloxacin], oxycodone, capsaicin, elastic bandages & [zinc], and nickel.  MEDICATIONS:  Current Outpatient Medications   Medication Sig Dispense Refill   acetaminophen (TYLENOL) 500 MG tablet Take 500 mg by mouth every 6 (six) hours as needed for moderate pain.     albuterol (PROVENTIL HFA;VENTOLIN HFA) 108 (90 Base) MCG/ACT inhaler Inhale 2 puffs into the lungs every 6 (six) hours as needed for wheezing or shortness of breath.     Calcium-Magnesium-Vitamin D (CALCIUM 1200+D3 PO) Take 1 tablet by mouth once a week.     cetirizine (ZYRTEC) 10 MG tablet Take 10 mg by mouth daily as needed for allergies.     Cholecalciferol (VITAMIN D3) 125 MCG (5000 UT) CAPS Take 5,000 Units by mouth daily.  dicyclomine (BENTYL) 20 MG tablet Take 20 mg by mouth 3 (three) times daily as needed for spasms.     fluticasone (FLONASE) 50 MCG/ACT nasal spray Place 1 spray into both nostrils 2 (two) times daily as needed for allergies or rhinitis. 16 g 5   hydrocortisone (ANUSOL-HC) 2.5 % rectal cream Apply topically 4 (four) times daily as needed.     levothyroxine (SYNTHROID, LEVOTHROID) 75 MCG tablet Take 75 mcg by mouth daily before breakfast.     metoprolol tartrate (LOPRESSOR) 25 MG tablet Take 0.5 tablets (12.5 mg total) by mouth as needed (for palpitations). 45 tablet 3   ondansetron (ZOFRAN) 8 MG tablet Take 8 mg by mouth every 8 (eight) hours as needed.     phenazopyridine (PYRIDIUM) 200 MG tablet Take 200 mg by mouth every 8 (eight) hours as needed.     sertraline (ZOLOFT) 100 MG tablet Take 150 mg by mouth daily with breakfast.     traMADol (ULTRAM) 50 MG tablet Take 50-100 mg by mouth every 6 (six) hours as needed.     triamcinolone cream (KENALOG) 0.1 % Apply 1 application topically daily as needed (rash).     warfarin (COUMADIN) 5 MG tablet Take 1 1/2 - 2 tablets daily as directed by coumadin clinic (Patient taking differently: Take 7.5-10 mg by mouth See admin instructions. Take 1 1/2 - 2 tablets daily as directed by coumadin clinic; 10 mg Tues Thurs and Sat, 7.5 mg all other days) 180 tablet 1   No current  facility-administered medications for this visit.    REVIEW OF SYSTEMS:   Review of Systems  Constitutional:  Negative for appetite change, fatigue and unexpected weight change.  Gastrointestinal:  Positive for blood in stool and diarrhea. Negative for abdominal pain.  Musculoskeletal:  Positive for arthralgias (5/10 R shoulder).  Psychiatric/Behavioral:  Positive for depression and sleep disturbance. The patient is nervous/anxious.   All other systems reviewed and are negative.    PHYSICAL EXAMINATION: ECOG PERFORMANCE STATUS: 0 - Asymptomatic  Vitals:   08/05/21 0814  BP: 135/85  Pulse: 67  Resp: 18  Temp: 97.8 F (36.6 C)  SpO2: 95%   Filed Weights   08/05/21 0814  Weight: 207 lb (93.9 kg)   Physical Exam Vitals reviewed.  Constitutional:      Appearance: Normal appearance. She is obese.  Cardiovascular:     Rate and Rhythm: Normal rate and regular rhythm.     Pulses: Normal pulses.     Heart sounds: Normal heart sounds.  Pulmonary:     Effort: Pulmonary effort is normal.     Breath sounds: Normal breath sounds.  Abdominal:     Palpations: Abdomen is soft. There is no hepatomegaly, splenomegaly or mass.     Tenderness: There is abdominal tenderness in the right lower quadrant.  Musculoskeletal:     Right lower leg: No edema.     Left lower leg: No edema.  Neurological:     General: No focal deficit present.     Mental Status: She is alert and oriented to person, place, and time.  Psychiatric:        Mood and Affect: Mood normal.        Behavior: Behavior normal.      LABORATORY DATA:  I have reviewed the data as listed    Latest Ref Rng & Units 07/28/2021    2:59 PM 04/08/2020   10:24 AM 02/05/2020    5:43 AM  CBC  WBC 4.0 -  10.5 K/uL 4.6  4.4  5.5   Hemoglobin 12.0 - 15.0 g/dL 14.5  13.5  10.6   Hematocrit 36.0 - 46.0 % 43.4  40.0  34.2   Platelets 150 - 400 K/uL 178  226  292       Latest Ref Rng & Units 07/28/2021    2:59 PM 01/06/2021    9:08  AM 04/08/2020   10:24 AM  CMP  Glucose 70 - 99 mg/dL 114   90   BUN 8 - 23 mg/dL 14   11   Creatinine 0.44 - 1.00 mg/dL 1.03  1.00  0.87   Sodium 135 - 145 mmol/L 143   141   Potassium 3.5 - 5.1 mmol/L 4.5   4.1   Chloride 98 - 111 mmol/L 108   105   CO2 22 - 32 mmol/L 28   30   Calcium 8.9 - 10.3 mg/dL 9.6   9.1     RADIOGRAPHIC STUDIES: I have personally reviewed the radiological images as listed and agreed with the findings in the report. DG Chest 1 View  Result Date: 07/29/2021 CLINICAL DATA:  Patient is having ablation of left kidney on August 06, 2021. EXAM: CHEST  1 VIEW COMPARISON:  January 06, 2021 FINDINGS: The heart size and mediastinal contours are within normal limits. Both lungs are clear. The visualized skeletal structures are unremarkable. IMPRESSION: No active disease. Electronically Signed   By: Abelardo Diesel M.D.   On: 07/29/2021 10:54    ASSESSMENT:  Cecal mass: - Colonoscopy (07/25/2021): 30 mm polyp in the cecum, in proximity to the appendiceal orifice with no infiltration.  Polyp was carpet like.  Positive for superficial focal ulceration. - Pathology: At least intramucosal adenocarcinoma/high-grade dysplasia.   Left kidney clear-cell renal cell carcinoma: - Left partial nephrectomy on 08/21/2016 - Clear-cell renal cell carcinoma, Fuhrman grade 3, spanning 12.5 cm.  Resection margins focally positive.  Tumor limited to kidney.  [id number]. - MRI of the abdomen (04/25/2021): Postoperative changes of prior partial left lower pole nephrectomy with enhancing soft tissue nodularity in the surgical bed measuring 10 x 6 mm concerning for local recurrence.   History of endometrial cancer: - Initially diagnosed in 2001, stage I, TAH and BSO. - Appendiceal recurrence in 2008, status postresection followed by XRT for 6 weeks.   Social/family history: - She lives by herself and is independent of ADLs and IADLs. - She is currently working as a Multimedia programmer. - She quit  smoking in 2000, smoked 1 pack/day for 20 years. - Father had prostate cancer.  Mother had breast cancer.  Maternal aunt had breast cancer.  Paternal first cousin had thyroid cancer.  Maternal grandmother had liver cancer.  Another paternal first cousin died of cancer.   PLAN:  Cecal mass: - I have discussed findings on colonoscopy and pathology report in detail. - As there is high possibility of invasive cancer, recommend surgical resection. - We will make referral to general surgery. - She already met with our geneticist and testing was done yesterday. - She also has CT CAP scheduled on Friday for staging. - Recommend follow-up 4 weeks after surgery.   Left kidney clear-cell RCC: - Recent MRI showed local recurrence.  She was evaluated by Dr. Kathlene Cote who is planning on ablation tomorrow.    All questions were answered. The patient knows to call the clinic with any problems, questions or concerns.   Derek Jack, MD, 08/05/21 5:22 PM  [facility] [phone]  I, Thana Ates, am acting as a Education administrator for Dr. Derek Jack.  I, Derek Jack MD, have reviewed the above documentation for accuracy and completeness, and I agree with the above. \

## 2021-08-06 ENCOUNTER — Observation Stay (HOSPITAL_COMMUNITY)
Admission: RE | Admit: 2021-08-06 | Discharge: 2021-08-07 | Disposition: A | Discharge status: Home / Self Care | Payer: Medicare Other | Source: Ambulatory Visit | Attending: Interventional Radiology | Admitting: Interventional Radiology

## 2021-08-06 ENCOUNTER — Ambulatory Visit (HOSPITAL_COMMUNITY): Payer: Medicare Other | Admitting: Physician Assistant

## 2021-08-06 ENCOUNTER — Encounter (HOSPITAL_COMMUNITY)
Admission: RE | Disposition: A | Discharge status: Home / Self Care | Payer: Self-pay | Source: Ambulatory Visit | Attending: Interventional Radiology

## 2021-08-06 ENCOUNTER — Ambulatory Visit (HOSPITAL_BASED_OUTPATIENT_CLINIC_OR_DEPARTMENT_OTHER): Payer: Medicare Other | Admitting: Anesthesiology

## 2021-08-06 ENCOUNTER — Other Ambulatory Visit: Payer: Self-pay

## 2021-08-06 ENCOUNTER — Encounter (HOSPITAL_COMMUNITY): Payer: Self-pay | Admitting: Interventional Radiology

## 2021-08-06 ENCOUNTER — Ambulatory Visit (HOSPITAL_COMMUNITY)
Admission: RE | Admit: 2021-08-06 | Discharge: 2021-08-06 | Disposition: A | Discharge status: Home / Self Care | Payer: Medicare Other | Source: Ambulatory Visit | Attending: Interventional Radiology | Admitting: Interventional Radiology

## 2021-08-06 ENCOUNTER — Encounter (HOSPITAL_COMMUNITY): Payer: Self-pay

## 2021-08-06 DIAGNOSIS — E119 Type 2 diabetes mellitus without complications: Secondary | ICD-10-CM

## 2021-08-06 DIAGNOSIS — I129 Hypertensive chronic kidney disease with stage 1 through stage 4 chronic kidney disease, or unspecified chronic kidney disease: Secondary | ICD-10-CM | POA: Insufficient documentation

## 2021-08-06 DIAGNOSIS — Z8542 Personal history of malignant neoplasm of other parts of uterus: Secondary | ICD-10-CM | POA: Diagnosis not present

## 2021-08-06 DIAGNOSIS — E039 Hypothyroidism, unspecified: Secondary | ICD-10-CM | POA: Insufficient documentation

## 2021-08-06 DIAGNOSIS — I10 Essential (primary) hypertension: Secondary | ICD-10-CM

## 2021-08-06 DIAGNOSIS — I4891 Unspecified atrial fibrillation: Secondary | ICD-10-CM | POA: Diagnosis not present

## 2021-08-06 DIAGNOSIS — N189 Chronic kidney disease, unspecified: Secondary | ICD-10-CM | POA: Insufficient documentation

## 2021-08-06 DIAGNOSIS — C642 Malignant neoplasm of left kidney, except renal pelvis: Secondary | ICD-10-CM | POA: Diagnosis not present

## 2021-08-06 DIAGNOSIS — Z87891 Personal history of nicotine dependence: Secondary | ICD-10-CM | POA: Diagnosis not present

## 2021-08-06 HISTORY — PX: RADIOLOGY WITH ANESTHESIA: SHX6223

## 2021-08-06 LAB — TYPE AND SCREEN
ABO/RH(D): A POS
Antibody Screen: NEGATIVE

## 2021-08-06 SURGERY — IR WITH ANESTHESIA
Anesthesia: General

## 2021-08-06 MED ORDER — MIDAZOLAM HCL 2 MG/2ML IJ SOLN
INTRAMUSCULAR | Status: AC
Start: 2021-08-06 — End: ?
  Filled 2021-08-06: qty 2

## 2021-08-06 MED ORDER — LORATADINE 10 MG PO TABS: 10.0000 mg | ORAL_TABLET | Freq: Every day | ORAL | Status: DC | PRN

## 2021-08-06 MED ORDER — ROCURONIUM BROMIDE 100 MG/10ML IV SOLN
INTRAVENOUS | Status: DC | PRN
  Administered 2021-08-06: 60 mg via INTRAVENOUS
  Administered 2021-08-06 (×2): 20 mg via INTRAVENOUS

## 2021-08-06 MED ORDER — LACTATED RINGERS IV SOLN: INTRAVENOUS | Status: DC

## 2021-08-06 MED ORDER — HYDROCORTISONE (PERIANAL) 2.5 % EX CREA
TOPICAL_CREAM | Freq: Four times a day (QID) | CUTANEOUS | Status: DC | PRN
Start: 2021-08-06 — End: 2021-08-07

## 2021-08-06 MED ORDER — SODIUM CHLORIDE 0.9 % IV SOLN
2.0000 g | Freq: Once | INTRAVENOUS | Status: DC
  Filled 2021-08-06: qty 2

## 2021-08-06 MED ORDER — SUGAMMADEX SODIUM 500 MG/5ML IV SOLN
INTRAVENOUS | Status: DC | PRN
  Administered 2021-08-06: 400 mg via INTRAVENOUS

## 2021-08-06 MED ORDER — EPHEDRINE SULFATE (PRESSORS) 50 MG/ML IJ SOLN
INTRAMUSCULAR | Status: DC | PRN
  Administered 2021-08-06 (×2): 10 mg via INTRAVENOUS

## 2021-08-06 MED ORDER — SERTRALINE HCL 50 MG PO TABS
150.0000 mg | ORAL_TABLET | Freq: Every day | ORAL | Status: DC
  Administered 2021-08-07: 150 mg via ORAL
  Filled 2021-08-06: qty 1

## 2021-08-06 MED ORDER — DOCUSATE SODIUM 100 MG PO CAPS
100.0000 mg | ORAL_CAPSULE | Freq: Two times a day (BID) | ORAL | Status: DC
  Administered 2021-08-06 – 2021-08-07 (×2): 100 mg via ORAL
  Filled 2021-08-06 (×3): qty 1

## 2021-08-06 MED ORDER — ONDANSETRON HCL 4 MG/2ML IJ SOLN
INTRAMUSCULAR | Status: DC | PRN
  Administered 2021-08-06: 4 mg via INTRAVENOUS

## 2021-08-06 MED ORDER — DEXTROSE 5 % IV SOLN
2.0000 g | Freq: Once | INTRAVENOUS | Status: DC
  Administered 2021-08-06: 2 g via INTRAVENOUS
  Filled 2021-08-06: qty 2

## 2021-08-06 MED ORDER — PROPOFOL 10 MG/ML IV BOLUS
INTRAVENOUS | Status: DC | PRN
  Administered 2021-08-06: 150 mg via INTRAVENOUS

## 2021-08-06 MED ORDER — FENTANYL CITRATE (PF) 250 MCG/5ML IJ SOLN
INTRAMUSCULAR | Status: AC
  Filled 2021-08-06: qty 5

## 2021-08-06 MED ORDER — LEVOTHYROXINE SODIUM 50 MCG PO TABS
75.0000 ug | ORAL_TABLET | Freq: Every day | ORAL | Status: DC
  Administered 2021-08-07: 75 ug via ORAL
  Filled 2021-08-06: qty 1

## 2021-08-06 MED ORDER — SODIUM CHLORIDE 0.9 % IV SOLN
INTRAVENOUS | Status: AC
  Filled 2021-08-06: qty 250

## 2021-08-06 MED ORDER — SENNOSIDES-DOCUSATE SODIUM 8.6-50 MG PO TABS: 1.0000 | ORAL_TABLET | Freq: Every day | ORAL | Status: DC | PRN

## 2021-08-06 MED ORDER — SODIUM CHLORIDE 0.9 % IV SOLN: INTRAVENOUS | Status: DC

## 2021-08-06 MED ORDER — DEXAMETHASONE SODIUM PHOSPHATE 10 MG/ML IJ SOLN
INTRAMUSCULAR | Status: DC | PRN
  Administered 2021-08-06: 10 mg via INTRAVENOUS

## 2021-08-06 MED ORDER — MIDAZOLAM HCL 5 MG/5ML IJ SOLN
INTRAMUSCULAR | Status: DC | PRN
  Administered 2021-08-06: 2 mg via INTRAVENOUS

## 2021-08-06 MED ORDER — IOHEXOL 300 MG/ML  SOLN
30.0000 mL | Freq: Once | INTRAMUSCULAR | Status: AC | PRN
  Administered 2021-08-06: 30 mL

## 2021-08-06 MED ORDER — FLUTICASONE PROPIONATE 50 MCG/ACT NA SUSP
1.0000 | Freq: Two times a day (BID) | NASAL | Status: DC | PRN
Start: 2021-08-06 — End: 2021-08-07

## 2021-08-06 MED ORDER — ONDANSETRON HCL 4 MG/2ML IJ SOLN: 4.0000 mg | Freq: Four times a day (QID) | INTRAMUSCULAR | Status: DC | PRN

## 2021-08-06 MED ORDER — DICYCLOMINE HCL 20 MG PO TABS
20.0000 mg | ORAL_TABLET | Freq: Three times a day (TID) | ORAL | Status: DC | PRN
Start: 2021-08-06 — End: 2021-08-07

## 2021-08-06 MED ORDER — CHLORHEXIDINE GLUCONATE 0.12 % MT SOLN
15.0000 mL | Freq: Once | OROMUCOSAL | Status: AC
  Administered 2021-08-06: 15 mL via OROMUCOSAL

## 2021-08-06 MED ORDER — PHENAZOPYRIDINE HCL 200 MG PO TABS: 200.0000 mg | ORAL_TABLET | Freq: Three times a day (TID) | ORAL | Status: DC | PRN

## 2021-08-06 MED ORDER — HYDROCODONE-ACETAMINOPHEN 5-325 MG PO TABS: 1.0000 | ORAL_TABLET | ORAL | Status: DC | PRN

## 2021-08-06 MED ORDER — ORAL CARE MOUTH RINSE: 15.0000 mL | Freq: Once | OROMUCOSAL | Status: AC

## 2021-08-06 MED ORDER — FENTANYL CITRATE (PF) 100 MCG/2ML IJ SOLN
INTRAMUSCULAR | Status: DC | PRN
  Administered 2021-08-06 (×2): 100 ug via INTRAVENOUS

## 2021-08-06 MED ORDER — ALBUTEROL SULFATE (2.5 MG/3ML) 0.083% IN NEBU: 3.0000 mL | INHALATION_SOLUTION | Freq: Four times a day (QID) | RESPIRATORY_TRACT | Status: DC | PRN

## 2021-08-06 MED ORDER — METOPROLOL TARTRATE 25 MG PO TABS: 12.5000 mg | ORAL_TABLET | ORAL | Status: DC | PRN

## 2021-08-06 MED ORDER — LIDOCAINE HCL (CARDIAC) PF 100 MG/5ML IV SOSY
PREFILLED_SYRINGE | INTRAVENOUS | Status: DC | PRN
  Administered 2021-08-06: 80 mg via INTRAVENOUS

## 2021-08-06 NOTE — Anesthesia Preprocedure Evaluation (Addendum)
Anesthesia Evaluation  Patient identified by MRN, date of birth, ID band Patient awake    Reviewed: Allergy & Precautions, NPO status , Patient's Chart, lab work & pertinent test results  Airway Mallampati: II  TM Distance: >3 FB Neck ROM: Full    Dental  (+) Dental Advisory Given, Teeth Intact No notable dental injury:   Pulmonary asthma , former smoker,    Pulmonary exam normal breath sounds clear to auscultation       Cardiovascular Exercise Tolerance: Good hypertension, Pt. on home beta blockers pulmonary hypertension+ dysrhythmias Atrial Fibrillation  Rhythm:Regular Rate:Normal  Echo 01/2020 1. Left ventricular ejection fraction, by estimation, is 60 to 65%. The left ventricle has normal function. The left ventricle has no regional wall motion abnormalities. There is mild concentric left ventricular hypertrophy and moderate basal septal hypertrophy.  2. Right ventricular systolic function is mildly reduced. The right ventricular size is normal. There is moderately elevated pulmonary artery systolic pressure. The estimated right ventricular systolic pressure is  85.8 mmHg.  3. Large pleural effusion in the left lateral region.  4. The mitral valve is normal in structure. No evidence of mitral valve regurgitation. No evidence of mitral stenosis.  5. The aortic valve is tricuspid. Aortic valve regurgitation is not visualized. Mild aortic valve sclerosis is present, with no evidence of aortic valve stenosis. Aortic valve area, by VTI measures 2.41 cm. Aortic  valve mean gradient measures 4.0 mmHg.  Aortic valve Vmax measures 1.51 m/s.  6. The inferior vena cava is dilated in size with <50% respiratory variability, suggesting right atrial pressure of 15 mmHg.  7. Tricuspid valve regurgitation is mild to moderate.    Neuro/Psych PSYCHIATRIC DISORDERS Anxiety Depression negative neurological ROS     GI/Hepatic negative GI ROS,  Neg liver ROS, GERD  Medicated,  Endo/Other  diabetes, Well Controlled, Type 2Hypothyroidism   Renal/GU Renal disease (kidney cancer, partial nephrectomy)negative Renal ROS     Musculoskeletal negative musculoskeletal ROS (+) Arthritis , Osteoarthritis,    Abdominal (+) + obese,   Peds  Hematology negative hematology ROS (+) Blood dyscrasia, anemia ,   Anesthesia Other Findings   Reproductive/Obstetrics negative OB ROS                            Anesthesia Physical  Anesthesia Plan  ASA: 4  Anesthesia Plan: General   Post-op Pain Management: Minimal or no pain anticipated   Induction: Intravenous  PONV Risk Score and Plan: 3 and Ondansetron, Dexamethasone and Treatment may vary due to age or medical condition  Airway Management Planned: Oral ETT  Additional Equipment: None  Intra-op Plan:   Post-operative Plan: Extubation in OR  Informed Consent: I have reviewed the patients History and Physical, chart, labs and discussed the procedure including the risks, benefits and alternatives for the proposed anesthesia with the patient or authorized representative who has indicated his/her understanding and acceptance.     Dental advisory given  Plan Discussed with: CRNA  Anesthesia Plan Comments:        Anesthesia Quick Evaluation

## 2021-08-06 NOTE — Anesthesia Procedure Notes (Signed)
Procedure Name: Intubation Date/Time: 08/06/2021 12:04 PM  Performed by: Jonna Munro, CRNAPre-anesthesia Checklist: Patient identified, Emergency Drugs available, Suction available, Patient being monitored and Timeout performed Patient Re-evaluated:Patient Re-evaluated prior to induction Oxygen Delivery Method: Circle system utilized Preoxygenation: Pre-oxygenation with 100% oxygen Induction Type: IV induction Ventilation: Mask ventilation without difficulty Laryngoscope Size: Mac and 3 Grade View: Grade I Tube type: Oral Tube size: 7.0 mm Number of attempts: 1 Airway Equipment and Method: Stylet Placement Confirmation: ETT inserted through vocal cords under direct vision, positive ETCO2 and breath sounds checked- equal and bilateral Secured at: 22 cm Tube secured with: Tape Dental Injury: Teeth and Oropharynx as per pre-operative assessment

## 2021-08-06 NOTE — Transfer of Care (Signed)
Immediate Anesthesia Transfer of Care Note  Patient: Melissa Velazquez  Procedure(s) Performed: IR WITH ANESTHESIA CRYOABLATION  Patient Location: PACU  Anesthesia Type:General  Level of Consciousness: awake, alert  and patient cooperative  Airway & Oxygen Therapy: Patient Spontanous Breathing and Patient connected to face mask oxygen  Post-op Assessment: Report given to RN, Post -op Vital signs reviewed and stable and Patient moving all extremities X 4  Post vital signs: Reviewed and stable  Last Vitals:  Vitals Value Taken Time  BP 144/83 08/06/21 1530  Temp    Pulse 69 08/06/21 1531  Resp 11 08/06/21 1531  SpO2 100 % 08/06/21 1531  Vitals shown include unvalidated device data.  Last Pain:  Vitals:   08/06/21 1044  TempSrc:   PainSc: 0-No pain         Complications: No notable events documented.

## 2021-08-06 NOTE — Progress Notes (Signed)
Patient is s/p left renal cryoablation with Dr. Kathlene Cote today.   Patient seen in PACU.   She is laying in bed, NAD, alert and oriented to self and place at the moment.  She states that she still feels like she is dreaming sometime.  She reports mild soreness on her left flank area, no other complaints.  VSS, hypertensive at baseline, currently 142/80 at 1615 hours  Positive dressing on left flank puncture site. Site is mildly hardened but non tender. Otherwise unremarkable with no erythema, edema, tenderness, bleeding or drainage. Minimal amount of old, dry blood noted on the dressing. Dressing otherwise clean, dry, and intact.  Urine in the Foley bag appears to be tea-colored, which is expected.   PLAN - will be admitted for overnight observation  - Foley to remain in place overnight  - IR APP to round tomorrow and will determine when Foley can be removed, expect to be removed in AM  - PRN Norco for pain control  - Plan d/c home tomorrow if stable   Please call IR for questions and concerns.   Armando Gang Aleanna Menge PA-C 08/06/2021 4:49 PM

## 2021-08-06 NOTE — Sedation Documentation (Signed)
Anesthesia in to sedate and monitor. 

## 2021-08-06 NOTE — Procedures (Signed)
Interventional Radiology Procedure Note  Procedure: CT guided cryoablation of left renal carcinoma recurrence  Anesthesia: General  Complications: None  Estimated Blood Loss: < 10 mL  Findings: Left renal carcinoma recurrence treated with cryoablation via Ice Pearl probe. Hydrodissection performed prior to ablation with 300 mL of fluid.  Plan: PACU recovery followed by overnight observation.   Venetia Night. Kathlene Cote, M.D Pager:  [phone]

## 2021-08-06 NOTE — H&P (Signed)
Chief Complaint: Patient was seen in consultation today for  left renal carcinoma recurrence  at the request of Herrick,Benjamin W  Referring Physician(s): Herrick,Benjamin W  Supervising Physician: Aletta Edouard  Patient Status: [facility] - Out-pt  History of Present Illness: Melissa Velazquez is a 71 y.o. female with PMHs of HTN, endometrial CA, clear-cell carcinoma of the left kidney s/p partial nephrectomy on 08/21/2016, recurrence of the left renal carcinoma who was referred to Dr. Kathlene Cote by her urologist Dr. Louis Meckel to discuss treatment options for the recurrent left renal carcinoma.   Patient had a consultation visit with Dr. Kathlene Cote on 06/23/21, she was deemed a good candidate for percutaneous cryoablation; however, placement of a retrograde ureteral stent placement by urology team prior to cryoablation was recommended to the patient due to the location of the recurrent renal carcinoma. Patient underwent left ureteral stent placement on 7/14, she presents to [facility] IR today for the cryoablation of the left renal mass with general anesthesia.  Patient laying in bed, not in acute distress.  Denise headache, fever, chills, shortness of breath, cough, chest pain, abdominal pain, nausea ,vomiting, and bleeding.  Patient asks how long will she need the stent, per Dr. Kathlene Cote the stent will need to be in place for at least 3-4 weeks.   Patient confirms that she hold her coumadin for 5 days.  She states that she held her coumadin from 7/1-7/7 due to colonoscopy, resumed coumadin 7/8-7/13. INR was taken on 7/10, which was 1.2. Discussed with Dr. Kathlene Cote is she needs repeat INR today, decision was made that no repeat INR is needed.   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    Past Surgical History:  Procedure Laterality Date   APPENDECTOMY  01/19/2006   BIOPSY  12/14/2016   Procedure: BIOPSY;  Surgeon: Rogene Houston, MD;  [facility];  Service: Endoscopy;;  colon   BIOPSY  07/25/2021   Procedure: BIOPSY;  Surgeon: Harvel Quale, MD;  Location: [facility];  Service: Gastroenterology;;   CHOLECYSTECTOMY  01/19/2006   COLONOSCOPY N/A 11/24/2012   Procedure: COLONOSCOPY;  Surgeon: Rogene Houston, MD;  Location: [facility];  Service: Endoscopy;  Laterality: N/A;  830   COLONOSCOPY N/A 12/14/2016   Procedure: COLONOSCOPY;  Surgeon: Rogene Houston, MD;  [facility];  Service: Endoscopy;  Laterality: N/A;  12:00   COLONOSCOPY  07/25/2021   Bx of polyp CA +   COLONOSCOPY WITH PROPOFOL N/A 07/25/2021   Procedure: COLONOSCOPY WITH PROPOFOL;  Surgeon: Harvel Quale, MD;  [facility]: [facility];  Service: Gastroenterology;  Laterality: N/A;  115 ASA 1   IR RADIOLOGIST  EVAL & MGMT  06/23/2021   POLYPECTOMY  12/14/2016   Procedure: POLYPECTOMY;  Surgeon: Rogene Houston, MD;  [facility];  Service: Endoscopy;;  colon   POLYPECTOMY  07/25/2021   Procedure: POLYPECTOMY;  Surgeon: Harvel Quale, MD;  Location: [facility];  Service: Gastroenterology;;   ROBOTIC ASSITED PARTIAL NEPHRECTOMY Left 08/21/2016   Procedure: XI ROBOTIC ASSITED PARTIAL NEPHRECTOMY;  Surgeon: Ardis Hughs, MD;  Location: [facility] ORS;  Service: Urology;  Laterality: Left;   TOTAL ABDOMINAL HYSTERECTOMY  01/20/1999   TOTAL KNEE ARTHROPLASTY Bilateral 01/24/2020   Procedure:  TOTAL KNEE BILATERAL;  Surgeon: Gaynelle Arabian, MD;  Location: [facility] OR[facility];  Service: Orthopedics;  Laterality: Bilateral;   WISDOM TOOTH EXTRACTION      Allergies: Triple antibiotic [bacitracin-neomycin-polymyxin], Levaquin [levofloxacin], Oxycodone, Capsaicin, Elastic bandages & [zinc], and Nickel  Medications: Prior to Admission medications   Medication Sig Start Date End Date Taking? Authorizing Provider  acetaminophen (TYLENOL) 500 MG tablet Take 500 mg by mouth every 6 (six) hours as needed for moderate pain.    [provider]  albuterol (PROVENTIL HFA;VENTOLIN HFA) 108 (90 Base) MCG/ACT inhaler Inhale 2 puffs into the lungs every 6 (six) hours as needed for wheezing or shortness of breath.    [provider]  Calcium-Magnesium-Vitamin D (CALCIUM 1200+D3 PO) Take 1 tablet by mouth once a week.    [provider]  cetirizine (ZYRTEC) 10 MG tablet Take 10 mg by mouth daily as needed for allergies.    [provider]  Cholecalciferol (VITAMIN D3) 125 MCG (5000 UT) CAPS Take 5,000 Units by mouth daily.    [provider]  dicyclomine (BENTYL) 20 MG tablet Take 20 mg by mouth 3 (three) times daily as needed for spasms.    [provider]  fluticasone (FLONASE) 50 MCG/ACT nasal spray Place 1 spray into both nostrils 2 (two) times daily as needed for allergies or rhinitis. 12/22/19   Valentina Shaggy, MD  hydrocortisone (ANUSOL-HC) 2.5 % rectal cream Apply topically 4 (four) times daily as needed. 05/27/21   [provider]  levothyroxine (SYNTHROID, LEVOTHROID) 75 MCG tablet Take 75 mcg by mouth daily before breakfast.    [provider]  metoprolol tartrate (LOPRESSOR) 25 MG tablet Take 0.5 tablets (12.5 mg total) by mouth as needed (for palpitations). 02/20/20   Richardson Dopp T, PA-C  ondansetron (ZOFRAN) 8 MG tablet Take 8 mg by mouth every 8 (eight) hours as needed. 04/19/21   [provider]  phenazopyridine  (PYRIDIUM) 200 MG tablet Take 200 mg by mouth every 8 (eight) hours as needed. 08/01/21   [provider]  sertraline (ZOLOFT) 100 MG tablet Take 150 mg by mouth daily with breakfast.    [provider]  traMADol (ULTRAM) 50 MG tablet Take 50-100 mg by mouth every 6 (six) hours as needed. 08/01/21   [provider]  triamcinolone cream (KENALOG) 0.1 % Apply 1 application topically daily as needed (rash).    [provider]  warfarin (COUMADIN) 5 MG tablet Take 1 1/2 - 2 tablets daily as directed by coumadin clinic Patient taking differently: Take 7.5-10 mg by mouth See admin instructions. Take 1 1/2 - 2 tablets daily as directed by coumadin clinic; 10 mg Tues Thurs and Sat, 7.5 mg all other days 06/26/21   Arnoldo Lenis, MD     Family History  Problem Relation Age of Onset   Breast cancer Mother    Diabetes Mother  Obesity Sister    Asthma Sister    Eczema Sister    COPD Father        smoked   Prostate cancer Father    Breast cancer Maternal Aunt    Urticaria Maternal Grandfather    Colon cancer Neg Hx     Social History   Socioeconomic History   Marital status: Single    Spouse name: Not on file   Number of children: 0   Years of education: Not on file   Highest education level: Not on file  Occupational History   Occupation: Psychologist  Tobacco Use   Smoking status: Former    Packs/day: 1.00    Years: 20.00    Total pack years: 20.00    Types: Cigarettes    Quit date: 01/19/1998    Years since quitting: 23.5    Passive exposure: Past   Smokeless tobacco: Never  Vaping Use   Vaping Use: Never used  Substance and Sexual Activity   Alcohol use: Yes    Alcohol/week: 0.0 standard drinks of alcohol    Comment: rare   Drug use: No   Sexual activity: Not Currently  Other Topics Concern   Not on file  Social History Narrative   2 cups of caffeine daily    Social Determinants of Health   Financial Resource Strain: Not on file   Food Insecurity: Not on file  Transportation Needs: Not on file  Physical Activity: Not on file  Stress: Not on file  Social Connections: Not on file     Review of Systems: A 12 point ROS discussed and pertinent positives are indicated in the HPI above.  All other systems are negative.  Vital Signs: There were no vitals taken for this visit.   Physical Exam Vitals and nursing note reviewed.  Constitutional:      General: Patient is not in acute distress.    Appearance: Normal appearance. Patient is not ill-appearing.  HENT:     Head: Normocephalic and atraumatic.     Mouth/Throat:     Mouth: Mucous membranes are moist.     Pharynx: Oropharynx is clear.  Cardiovascular:     Rate and Rhythm: Normal rate and regular rhythm.     Pulses: Normal pulses.     Heart sounds: Normal heart sounds.  Pulmonary:     Effort: Pulmonary effort is normal.     Breath sounds: Normal breath sounds.  Abdominal:     General: Abdomen is flat. Bowel sounds are normal.     Palpations: Abdomen is soft.  Musculoskeletal:     Cervical back: Neck supple.  Skin:    General: Skin is warm and dry.     Coloration: Skin is not jaundiced or pale.  Neurological:     Mental Status: Patient is alert and oriented to person, place, and time.  Psychiatric:        Mood and Affect: Mood normal.        Behavior: Behavior normal.        Judgment: Judgment normal.    MD Evaluation Airway: WNL Heart: WNL Abdomen: WNL Chest/ Lungs: WNL ASA  Classification: 3 Mallampati/Airway Score: Two  Imaging: DG Chest 1 View  Result Date: 07/29/2021 CLINICAL DATA:  Patient is having ablation of left kidney on August 06, 2021. EXAM: CHEST  1 VIEW COMPARISON:  January 06, 2021 FINDINGS: The heart size and mediastinal contours are within normal limits. Both lungs are clear. The visualized skeletal structures are unremarkable.  IMPRESSION: No active disease. Electronically Signed   By: Abelardo Diesel M.D.   On: 07/29/2021  10:54    Labs:  CBC: Recent Labs    07/28/21 1459  WBC 4.6  HGB 14.5  HCT 43.4  PLT 178    COAGS: Recent Labs    06/05/21 0924 06/26/21 0916 07/16/21 1121 07/28/21 1459  INR 1.8* 1.8* 3.6* 1.2    BMP: Recent Labs    01/06/21 0908 07/28/21 1459  NA  --  143  K  --  4.5  CL  --  108  CO2  --  28  GLUCOSE  --  114*  BUN  --  14  CALCIUM  --  9.6  CREATININE 1.00 1.03*  GFRNONAA  --  58*    LIVER FUNCTION TESTS: No results for input(s): "BILITOT", "AST", "ALT", "ALKPHOS", "PROT", "ALBUMIN" in the last 8760 hours.  TUMOR MARKERS: No results for input(s): "AFPTM", "CEA", "CA199", "CHROMGRNA" in the last 8760 hours.  Assessment and Plan: 71 y.o. female with hx clear cell carcinoma of left kidney s/p partical nephrectomy in 2018 presents with recurrent left renal call carcinoma.   NPO since MN VSS CBC, INR, BMP obtained on 7/10, all stable.  Patient on coumadin, has been held for 5 days.   Risks and benefits of image guided renal cryoablation was discussed with the patient including, but not limited to, failure to treat entire lesion, bleeding, infection, damage to adjacent structures, hematuria, urine leak, decrease in renal function or post procedural neuropathy.  All of the patient's questions were answered and the patient is agreeable to proceed. Consent signed and in chart.  Patient will be admitted for overnight observation.   Thank you for this interesting consult.  I greatly enjoyed meeting ALEIRA DEITER and look forward to participating in their care.  A copy of this report was sent to the requesting provider on this date.  Electronically Signed: Tera Mater, PA-C 08/06/2021, 11:20 AM   I spent a total of    25 Minutes in face to face in clinical consultation, greater than 50% of which was counseling/coordinating care for left renal cryoablation.   This chart was dictated using voice recognition software.  Despite best efforts to proofread,   errors can occur which can change the documentation meaning.

## 2021-08-06 NOTE — H&P (Deleted)
  The note originally documented on this encounter has been moved the the encounter in which it belongs.  

## 2021-08-07 ENCOUNTER — Encounter (HOSPITAL_COMMUNITY): Payer: Self-pay | Admitting: Interventional Radiology

## 2021-08-07 DIAGNOSIS — C642 Malignant neoplasm of left kidney, except renal pelvis: Secondary | ICD-10-CM | POA: Diagnosis not present

## 2021-08-07 DIAGNOSIS — I129 Hypertensive chronic kidney disease with stage 1 through stage 4 chronic kidney disease, or unspecified chronic kidney disease: Secondary | ICD-10-CM | POA: Diagnosis not present

## 2021-08-07 DIAGNOSIS — E039 Hypothyroidism, unspecified: Secondary | ICD-10-CM | POA: Diagnosis not present

## 2021-08-07 DIAGNOSIS — Z9889 Other specified postprocedural states: Secondary | ICD-10-CM | POA: Diagnosis not present

## 2021-08-07 DIAGNOSIS — N189 Chronic kidney disease, unspecified: Secondary | ICD-10-CM | POA: Diagnosis not present

## 2021-08-07 DIAGNOSIS — Z8542 Personal history of malignant neoplasm of other parts of uterus: Secondary | ICD-10-CM | POA: Diagnosis not present

## 2021-08-07 DIAGNOSIS — Z87891 Personal history of nicotine dependence: Secondary | ICD-10-CM | POA: Diagnosis not present

## 2021-08-07 LAB — CBC
HCT: 37 % (ref 36.0–46.0)
Hemoglobin: 12.2 g/dL (ref 12.0–15.0)
MCH: 31.9 pg (ref 26.0–34.0)
MCHC: 33 g/dL (ref 30.0–36.0)
MCV: 96.9 fL (ref 80.0–100.0)
Platelets: 146 10*3/uL — ABNORMAL LOW (ref 150–400)
RBC: 3.82 MIL/uL — ABNORMAL LOW (ref 3.87–5.11)
RDW: 13.4 % (ref 11.5–15.5)
WBC: 6.2 10*3/uL (ref 4.0–10.5)
nRBC: 0 % (ref 0.0–0.2)

## 2021-08-07 LAB — BASIC METABOLIC PANEL
Anion gap: 6 (ref 5–15)
BUN: 12 mg/dL (ref 8–23)
CO2: 27 mmol/L (ref 22–32)
Calcium: 8.7 mg/dL — ABNORMAL LOW (ref 8.9–10.3)
Chloride: 110 mmol/L (ref 98–111)
Creatinine, Ser: 0.81 mg/dL (ref 0.44–1.00)
GFR, Estimated: >60 mL/min (ref >60)
Glucose, Bld: 107 mg/dL — ABNORMAL HIGH (ref 70–99)
Potassium: 4.1 mmol/L (ref 3.5–5.1)
Sodium: 143 mmol/L (ref 135–145)

## 2021-08-07 MED ORDER — HYDROCODONE-ACETAMINOPHEN 5-325 MG PO TABS: 1.0000 | ORAL_TABLET | ORAL | 0 refills | Status: DC | PRN

## 2021-08-07 MED ORDER — MAGIC MOUTHWASH
15.0000 mL | Freq: Four times a day (QID) | ORAL | Status: DC | PRN
  Administered 2021-08-07: 15 mL via ORAL
  Filled 2021-08-07 (×2): qty 15

## 2021-08-07 NOTE — Discharge Summary (Signed)
Physician Discharge Summary  Patient ID: Melissa Velazquez MRN: [id number] DOB/AGE: 70-21-1952 70 y.o.  Admit date: 08/06/2021 Discharge date: 08/07/2021  Admission Diagnoses:  Discharge Diagnoses:  Principal Problem:   Renal carcinoma, left [facility])   Discharged Condition: good  Hospital Course: Pt presented to [facility] IR for planned left renal cryoblation for recurrence of RCC on 08/06/21.  Procedure was technically successful without incident.  She was observed overnight.  Pain is well controlled, voiding after foley removal, and no obvious blood in urine.  She did develop what she reports as thrush after antibiotics given during the procedure and requested mouth rinse.  Magic Mouthwash was given.  She continues with 2/10 suprapubic pain.  This is not new.  Pt is discharging today in good condition and will follow up with Dr. Kathlene Cote in approximately 1 month.  Encouraged to keep all other medical appointments with Oncology, Urology, ...etc   Consults: None  Significant Diagnostic Studies: None  Treatments: Cryoblation treatment of renal cell carcinoma  Discharge Exam: Blood pressure 133/81, pulse (!) 51, temperature 97.6 F (36.4 C), temperature source Oral, resp. rate 20, height '5\' 4"'$  (1.626 m), weight 205 lb (93 kg), SpO2 98 %. Appears NAD, A/Ox3, sitting up in bed with sister at bedside. Left flank dressing is dry without evidence of ongoing bleeding.    Disposition: Discharge disposition: 01-Home or Self Care       Discharge Instructions     Call MD for:  persistant nausea and vomiting   Complete by: As directed    Call MD for:  redness, tenderness, or signs of infection (pain, swelling, redness, odor or green/yellow discharge around incision site)   Complete by: As directed    Call MD for:  severe uncontrolled pain   Complete by: As directed    Call MD for:  temperature >100.4   Complete by: As directed    Discharge instructions   Complete by: As directed    Increase  activity slowly.  No driving while on pain medication.  Urine should become progressively more clear.  For uncontrolled pain, bleeding, fever, or inability to void, call [phone] and/or go to emergency department.  Our scheduler will reach out to you regarding follow up appointment with Dr. Kathlene Cote, to occur in approximately 1 month.   Increase activity slowly   Complete by: As directed    Remove dressing in 24 hours   Complete by: As directed    Keep site clean and dry until healed.      Allergies as of 08/07/2021       Reactions   Triple Antibiotic [bacitracin-neomycin-polymyxin] Anaphylaxis   Levaquin [levofloxacin] Other (See Comments)   Stomach upset   Oxycodone    Tremors and brain fog   Capsaicin Rash   Elastic Bandages & [zinc] Rash   Nickel Itching, Rash        Medication List     TAKE these medications    acetaminophen 500 MG tablet Commonly known as: TYLENOL Take 500 mg by mouth every 6 (six) hours as needed for moderate pain.   albuterol 108 (90 Base) MCG/ACT inhaler Commonly known as: VENTOLIN HFA Inhale 2 puffs into the lungs every 6 (six) hours as needed for wheezing or shortness of breath.   CALCIUM 1200+D3 PO Take 1 tablet by mouth once a week.   cetirizine 10 MG tablet Commonly known as: ZYRTEC Take 10 mg by mouth daily as needed for allergies.   dicyclomine 20 MG tablet Commonly known as:  BENTYL Take 20 mg by mouth 3 (three) times daily as needed for spasms.   fluticasone 50 MCG/ACT nasal spray Commonly known as: FLONASE Place 1 spray into both nostrils 2 (two) times daily as needed for allergies or rhinitis.   HYDROcodone-acetaminophen 5-325 MG tablet Commonly known as: NORCO/VICODIN Take 1-2 tablets by mouth every 4 (four) hours as needed for moderate pain.   hydrocortisone 2.5 % rectal cream Commonly known as: ANUSOL-HC Apply topically 4 (four) times daily as needed.   levothyroxine 75 MCG tablet Commonly known as: SYNTHROID Take  75 mcg by mouth daily before breakfast.   metoprolol tartrate 25 MG tablet Commonly known as: LOPRESSOR Take 0.5 tablets (12.5 mg total) by mouth as needed (for palpitations).   ondansetron 8 MG tablet Commonly known as: ZOFRAN Take 8 mg by mouth every 8 (eight) hours as needed.   phenazopyridine 200 MG tablet Commonly known as: PYRIDIUM Take 200 mg by mouth every 8 (eight) hours as needed.   sertraline 100 MG tablet Commonly known as: ZOLOFT Take 150 mg by mouth daily with breakfast.   traMADol 50 MG tablet Commonly known as: ULTRAM Take 50-100 mg by mouth every 6 (six) hours as needed.   triamcinolone cream 0.1 % Commonly known as: KENALOG Apply 1 application topically daily as needed (rash).   Vitamin D3 125 MCG (5000 UT) Caps Take 5,000 Units by mouth daily.   warfarin 5 MG tablet Commonly known as: COUMADIN Take as directed. If you are unsure how to take this medication, talk to your nurse or doctor. Original instructions: Take 1 1/2 - 2 tablets daily as directed by coumadin clinic What changed:  how much to take how to take this when to take this additional instructions        Follow-up [place] Follow up.   Contact information: [place] [address] [phone]                 Signed: Pasty Spillers 08/07/2021, 1:38 PM  I spent a total of 25 Minutes in face to face in clinical consultation, greater than 50% of which was counseling/coordinating care for left renal cryoablation.

## 2021-08-07 NOTE — Progress Notes (Signed)
  Transition of Care [facility]) Screening Note   Patient Details  Name: Melissa Velazquez Date of Birth: 06/02/1950   Transition of Care [facility]) CM/SW Contact:    Dessa Phi, RN Phone Number: 08/07/2021, 10:15 AM    Transition of Care Department [facility]) has reviewed patient and no TOC needs have been identified at this time. We will continue to monitor patient advancement through interdisciplinary progression rounds. If new patient transition needs arise, please place a TOC consult.

## 2021-08-08 ENCOUNTER — Ambulatory Visit (HOSPITAL_COMMUNITY): Payer: Medicare Other

## 2021-08-08 NOTE — Anesthesia Postprocedure Evaluation (Signed)
Anesthesia Post Note  Patient: Melissa Velazquez  Procedure(s) Performed: IR WITH ANESTHESIA CRYOABLATION     Patient location during evaluation: PACU Anesthesia Type: General Level of consciousness: awake and alert Pain management: pain level controlled Vital Signs Assessment: post-procedure vital signs reviewed and stable Respiratory status: spontaneous breathing, nonlabored ventilation and respiratory function stable Cardiovascular status: blood pressure returned to baseline and stable Postop Assessment: no apparent nausea or vomiting Anesthetic complications: no   No notable events documented.  Last Vitals:  Vitals:   08/07/21 0149 08/07/21 0635  BP: 118/69 133/81  Pulse: (!) 51 (!) 51  Resp: 18 20  Temp: 36.6 C 36.4 C  SpO2: 96% 98%    Last Pain:  Vitals:   08/07/21 0635  TempSrc: Oral  PainSc:                  Lidia Collum

## 2021-08-11 ENCOUNTER — Other Ambulatory Visit: Payer: Self-pay | Admitting: Interventional Radiology

## 2021-08-11 DIAGNOSIS — C642 Malignant neoplasm of left kidney, except renal pelvis: Secondary | ICD-10-CM

## 2021-08-13 ENCOUNTER — Ambulatory Visit (HOSPITAL_COMMUNITY)
Admission: RE | Admit: 2021-08-13 | Discharge: 2021-08-13 | Disposition: A | Discharge status: Home / Self Care | Payer: Medicare Other | Source: Ambulatory Visit | Attending: Gastroenterology | Admitting: Gastroenterology

## 2021-08-13 DIAGNOSIS — Z85528 Personal history of other malignant neoplasm of kidney: Secondary | ICD-10-CM | POA: Diagnosis not present

## 2021-08-13 DIAGNOSIS — K6389 Other specified diseases of intestine: Secondary | ICD-10-CM | POA: Diagnosis not present

## 2021-08-13 DIAGNOSIS — K567 Ileus, unspecified: Secondary | ICD-10-CM | POA: Diagnosis not present

## 2021-08-13 DIAGNOSIS — C189 Malignant neoplasm of colon, unspecified: Secondary | ICD-10-CM | POA: Diagnosis not present

## 2021-08-13 DIAGNOSIS — K529 Noninfective gastroenteritis and colitis, unspecified: Secondary | ICD-10-CM | POA: Diagnosis not present

## 2021-08-13 DIAGNOSIS — R918 Other nonspecific abnormal finding of lung field: Secondary | ICD-10-CM | POA: Diagnosis not present

## 2021-08-13 DIAGNOSIS — I700 Atherosclerosis of aorta: Secondary | ICD-10-CM | POA: Diagnosis not present

## 2021-08-13 MED ORDER — IOHEXOL 300 MG/ML  SOLN
100.0000 mL | Freq: Once | INTRAMUSCULAR | Status: AC | PRN
  Administered 2021-08-13: 100 mL via INTRAVENOUS

## 2021-08-14 ENCOUNTER — Ambulatory Visit (INDEPENDENT_AMBULATORY_CARE_PROVIDER_SITE_OTHER): Payer: Medicare Other | Admitting: *Deleted

## 2021-08-14 ENCOUNTER — Encounter: Payer: Self-pay | Admitting: Emergency Medicine

## 2021-08-14 ENCOUNTER — Ambulatory Visit
Admission: EM | Admit: 2021-08-14 | Discharge: 2021-08-14 | Disposition: A | Discharge status: Home / Self Care | Payer: Medicare Other | Attending: Nurse Practitioner | Admitting: Nurse Practitioner

## 2021-08-14 DIAGNOSIS — Z5181 Encounter for therapeutic drug level monitoring: Secondary | ICD-10-CM | POA: Diagnosis not present

## 2021-08-14 DIAGNOSIS — I480 Paroxysmal atrial fibrillation: Secondary | ICD-10-CM

## 2021-08-14 DIAGNOSIS — R399 Unspecified symptoms and signs involving the genitourinary system: Secondary | ICD-10-CM

## 2021-08-14 LAB — POCT URINALYSIS DIP (MANUAL ENTRY)
Glucose, UA: NEGATIVE mg/dL
Ketones, POC UA: NEGATIVE mg/dL
Nitrite, UA: NEGATIVE
Protein Ur, POC: 100 mg/dL — AB
Spec Grav, UA: >=1.03 — AB (ref 1.010–1.025)
Urobilinogen, UA: 0.2 E.U./dL
pH, UA: 5 (ref 5.0–8.0)

## 2021-08-14 LAB — POCT INR: INR: 1.3 — AB (ref 2.0–3.0)

## 2021-08-14 MED ORDER — NITROFURANTOIN MONOHYD MACRO 100 MG PO CAPS: 100.0000 mg | ORAL_CAPSULE | Freq: Two times a day (BID) | ORAL | 0 refills | Status: DC

## 2021-08-14 NOTE — ED Provider Notes (Signed)
[facility]    CSN: [id number] Arrival date & time: 08/14/21  1902      History   Chief Complaint No chief complaint on file.   HPI Melissa Velazquez is a 71 y.o. female.   The history is provided by the patient.   Patient presents for urinary tract infection symptoms that been present x1 day.  Patient reports that on 7/19, she had a kidney ablation and during the procedure, she stooled on herself.  Patient had a kidney ablation due to recurrence of left renal carcinoma.  Procedure was performed by Dr. Kathlene Cote, patient was referred by Dr. Louis Meckel.  She was concerned that she may have developed a urinary tract infection from the contamination of the stool.  She complains of urinary frequency, urgency, and suprapubic pressure.  She states that the suprapubic pressure has been present since the stent was placed, but she was started on Pyridium which helped relieve the symptoms.  She denies fever, chills, low back pain, flank pain, hematuria, decreased urine stream, abdominal pain, nausea, vomiting, or diarrhea.  Patient continues to have ureteral stent at this time.  Patient states she was taking Pyridium due to suprapubic pain prior to the ureteral stent placement.  Past Medical History:  Diagnosis Date   Allergic rhinitis    Anal fissure    Hx of    Anxiety    hx of   Arthritis    Asthma    none in last year seasonal   Chronic kidney disease    partial nephrectomy   Depression    hx of   Diverticular disease    Dysrhythmia    a_fib   Eczema    Endometrial cancer ([place]) 2001   spread to appendix    Hemorrhoids    Hyperglycemia    Hypertension    Hypothyroidism    Kidney cancer, primary, with metastasis from kidney to other site 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    Patient Active Problem List   Diagnosis Date Noted   Renal carcinoma, left ([place]) 08/06/2021    Rectal bleeding 06/12/2021   Atrial fibrillation ([place]) 04/01/2020   Encounter for therapeutic drug monitoring 04/01/2020   Acute blood loss anemia 02/09/2020   Atrial fibrillation with rapid ventricular response ([place]) 01/29/2020   Hypoxia 01/29/2020   Hypothyroidism 01/29/2020   OA (osteoarthritis) of knee 01/24/2020   Bilateral primary osteoarthritis of knee 01/24/2020   Seasonal and perennial allergic rhinitis 12/24/2019   Allergic contact dermatitis 12/24/2019   Mild intermittent asthma, uncomplicated 76/19/5093   IBS (irritable bowel syndrome) 04/11/2019   History of colonic polyps 11/25/2016   Renal mass 08/21/2016   Upper airway cough syndrome 07/06/2014   Essential hypertension 07/06/2014   Personal history of colonic polyps 09/17/2010   BACK PAIN 09/14/2008   HIRSUTISM 06/15/2008   GLUCOSE INTOLERANCE 01/09/2008   NEOPLASM, MALIGNANT, UTERUS 12/27/2006   THYROID NODULE 12/27/2006   DIVERTICULAR DISEASE 06/03/2006   OBESITY NOS 03/09/2006   DEPRESSION 03/09/2006   ALLERGIC RHINITIS 03/09/2006   ASTHMA 03/09/2006   GERD 03/09/2006   ELEVATED BLOOD PRESSURE WITHOUT DIAGNOSIS OF HYPERTENSION 03/09/2006    Past Surgical History:  Procedure Laterality Date   APPENDECTOMY  01/19/2006   BIOPSY  12/14/2016   Procedure: BIOPSY;  Surgeon: Rogene Houston, MD;  [facility];  Service: Endoscopy;;  colon   BIOPSY  07/25/2021   Procedure: BIOPSY;  Surgeon: Harvel Quale, MD;  [facility];  Service: Gastroenterology;;   CHOLECYSTECTOMY  01/19/2006   COLONOSCOPY N/A 11/24/2012   Procedure: COLONOSCOPY;  Surgeon: Rogene Houston, MD;  [facility];  Service: Endoscopy;  Laterality: N/A;  830   COLONOSCOPY N/A 12/14/2016   Procedure: COLONOSCOPY;  Surgeon: Rogene Houston, MD;  [facility];  Service: Endoscopy;  Laterality: N/A;  12:00   COLONOSCOPY  07/25/2021   Bx of polyp CA +   COLONOSCOPY WITH PROPOFOL N/A 07/25/2021    Procedure: COLONOSCOPY WITH PROPOFOL;  Surgeon: Harvel Quale, MD;  [facility];  Service: Gastroenterology;  Laterality: N/A;  115 ASA 1   IR RADIOLOGIST EVAL & MGMT  06/23/2021   POLYPECTOMY  12/14/2016   Procedure: POLYPECTOMY;  Surgeon: Rogene Houston, MD;  [facility];  Service: Endoscopy;;  colon   POLYPECTOMY  07/25/2021   Procedure: POLYPECTOMY;  Surgeon: Harvel Quale, MD;  [facility];  Service: Gastroenterology;;   RADIOLOGY WITH ANESTHESIA N/A 08/06/2021   Procedure: IR WITH ANESTHESIA CRYOABLATION;  Surgeon: Aletta Edouard, MD;  Location: [facility];  Service: Radiology;  Laterality: N/A;   ROBOTIC ASSITED PARTIAL NEPHRECTOMY Left 08/21/2016   Procedure: XI ROBOTIC ASSITED PARTIAL NEPHRECTOMY;  Surgeon: Ardis Hughs, MD;  Location: [facility];  Service: Urology;  Laterality: Left;   TOTAL ABDOMINAL HYSTERECTOMY  01/20/1999   TOTAL KNEE ARTHROPLASTY Bilateral 01/24/2020   Procedure: TOTAL KNEE BILATERAL;  Surgeon: Gaynelle Arabian, MD;  Location: [facility];  Service: Orthopedics;  Laterality: Bilateral;   WISDOM TOOTH EXTRACTION      OB History   No obstetric history on file.      Home Medications    Prior to Admission medications   Medication Sig Start Date End Date Taking? Authorizing Provider  nitrofurantoin, macrocrystal-monohydrate, (MACROBID) 100 MG capsule Take 1 capsule (100 mg total) by mouth 2 (two) times daily. 08/14/21  Yes Joziah Dollins-Warren, Alda Lea, NP  acetaminophen (TYLENOL) 500 MG tablet Take 500 mg by mouth every 6 (six) hours as needed for moderate pain.    [provider]  albuterol (PROVENTIL HFA;VENTOLIN HFA) 108 (90 Base) MCG/ACT inhaler Inhale 2 puffs into the lungs every 6 (six) hours as needed for wheezing or shortness of breath.    [provider]  Calcium-Magnesium-Vitamin D (CALCIUM 1200+D3 PO) Take 1 tablet by mouth once a week.    [provider]  cetirizine (ZYRTEC)  10 MG tablet Take 10 mg by mouth daily as needed for allergies.    [provider]  Cholecalciferol (VITAMIN D3) 125 MCG (5000 UT) CAPS Take 5,000 Units by mouth daily.    [provider]  dicyclomine (BENTYL) 20 MG tablet Take 20 mg by mouth 3 (three) times daily as needed for spasms.    [provider]  fluticasone (FLONASE) 50 MCG/ACT nasal spray Place 1 spray into both nostrils 2 (two) times daily as needed for allergies or rhinitis. 12/22/19   Valentina Shaggy, MD  HYDROcodone-acetaminophen (NORCO/VICODIN) 5-325 MG tablet Take 1-2 tablets by mouth every 4 (four) hours as needed for moderate pain. 08/07/21   Boisseau, Hayley, PA  hydrocortisone (ANUSOL-HC) 2.5 % rectal cream Apply topically 4 (four) times daily as needed. 05/27/21   [provider]  levothyroxine (SYNTHROID, LEVOTHROID) 75 MCG tablet Take 75 mcg by mouth daily before breakfast.    [provider]  metoprolol tartrate (LOPRESSOR) 25 MG tablet Take 0.5 tablets (12.5 mg total) by mouth as needed (for palpitations). 02/20/20   Richardson Dopp T, PA-C  ondansetron (ZOFRAN) 8 MG tablet Take 8 mg by mouth every 8 (eight) hours as needed. 04/19/21   [provider]  phenazopyridine (PYRIDIUM) 200 MG tablet Take 200 mg by mouth every 8 (eight) hours as needed. 08/01/21   [provider]  sertraline (ZOLOFT) 100 MG tablet Take 150 mg by mouth daily with breakfast.    [provider]  traMADol (ULTRAM) 50 MG tablet Take 50-100 mg by mouth every 6 (six) hours as needed. 08/01/21   [provider]  triamcinolone cream (KENALOG) 0.1 % Apply 1 application topically daily as needed (rash).    [provider]  warfarin (COUMADIN) 5 MG tablet Take 1 1/2 - 2 tablets daily as directed by coumadin clinic Patient taking differently: Take 7.5-10 mg by mouth See admin instructions. Take 1 1/2 - 2 tablets daily as directed by coumadin clinic; 10 mg Tues Thurs and Sat, 7.5 mg  all other days 06/26/21   Arnoldo Lenis, MD    Family History Family History  Problem Relation Age of Onset   Breast cancer Mother    Diabetes Mother    Obesity Sister    Asthma Sister    Eczema Sister    COPD Father        smoked   Prostate cancer Father    Breast cancer Maternal Aunt    Urticaria Maternal Grandfather    Colon cancer Neg Hx     Social History Social History   Tobacco Use   Smoking status: Former    Packs/day: 1.00    Years: 20.00    Total pack years: 20.00    Types: Cigarettes    Quit date: 01/19/1998    Years since quitting: 23.5    Passive exposure: Past   Smokeless tobacco: Never  Vaping Use   Vaping Use: Never used  Substance Use Topics   Alcohol use: Yes    Alcohol/week: 0.0 standard drinks of alcohol    Comment: rare   Drug use: No     Allergies   Triple antibiotic [bacitracin-neomycin-polymyxin], Levaquin [levofloxacin], Oxycodone, Capsaicin, Elastic bandages & [zinc], and Nickel   Review of Systems Review of Systems Per HPI  Physical Exam Triage Vital Signs ED Triage Vitals [08/14/21 1907]  Enc Vitals Group     BP (!) 145/88     Pulse Rate 61     Resp 18     Temp 97.6 F (36.4 C)     Temp Source Oral     SpO2 95 %     Weight      Height      Head Circumference      Peak Flow      Pain Score 4     Pain Loc      Pain Edu?      Excl. in [place]?    No data found.  Updated Vital Signs BP (!) 145/88 (BP Location: Right Arm)   Pulse 61   Temp 97.6 F (36.4 C) (Oral)   Resp 18   SpO2 95%   Visual Acuity Right Eye Distance:   Left Eye Distance:   Bilateral Distance:    Right Eye Near:   Left Eye Near:    Bilateral Near:     Physical Exam Vitals and nursing note reviewed.  Constitutional:  General: She is not in acute distress.    Appearance: She is well-developed.  HENT:     Head: Normocephalic.  Eyes:     Extraocular Movements: Extraocular movements intact.     Pupils: Pupils are equal, round, and  reactive to light.  Cardiovascular:     Rate and Rhythm: Normal rate and regular rhythm.     Pulses: Normal pulses.     Heart sounds: Normal heart sounds.  Pulmonary:     Effort: Pulmonary effort is normal.     Breath sounds: Normal breath sounds.  Abdominal:     General: Bowel sounds are normal. There is no distension.     Palpations: Abdomen is soft.     Tenderness: There is abdominal tenderness in the suprapubic area. There is no right CVA tenderness, left CVA tenderness, guarding or rebound.  Genitourinary:    Vagina: Normal. No vaginal discharge.  Musculoskeletal:     Cervical back: Normal range of motion.  Lymphadenopathy:     Cervical: No cervical adenopathy.  Skin:    General: Skin is warm and dry.     Findings: No erythema or rash.  Neurological:     General: No focal deficit present.     Mental Status: She is alert and oriented to person, place, and time.     Cranial Nerves: No cranial nerve deficit.  Psychiatric:        Mood and Affect: Mood normal.        Behavior: Behavior normal.      UC Treatments / Results  Labs (all labs ordered are listed, but only abnormal results are displayed) Labs Reviewed  POCT URINALYSIS DIP (MANUAL ENTRY) - Abnormal; Notable for the following components:      Result Value   Clarity, UA cloudy (*)    Bilirubin, UA small (*)    Spec Grav, UA >=1.030 (*)    Blood, UA large (*)    Protein Ur, POC =100 (*)    Leukocytes, UA Small (1+) (*)    All other components within normal limits  URINE CULTURE    EKG   Radiology   Procedures Procedures (including critical care time)  Medications Ordered in UC Medications - No data to display  Initial Impression / Assessment and Plan / UC Course  I have reviewed the triage vital signs and the nursing notes.  Pertinent labs & imaging results that were available during my care of the patient were reviewed by me and considered in my medical decision making (see chart for  details).  Patient presents with urinary symptoms that been present for the past 24 hours.  Patient with a history of left renal carcinoma, with recent recurrence requiring ablation on 08/06/2021.  Patient reports that during the procedure she stooled herself, and was concerned that she is now having urinary tract symptoms as result of this occurrence.  On exam, patient does not have any CVA tenderness, her vital signs are stable, she is in no acute distress.  She does exhibit suprapubic tenderness, but after review of her chart, she did have this symptom prior to her ureteral stent placement.  Urinalysis is positive for small leukocytes, large blood, and protein.  We will treat patient for suspected urinary tract infection, urine culture is pending.  Patient was advised that if her culture results are negative, she will be contacted and asked to stop the medication.  Patient was advised to follow-up with Dr. Louis Meckel to let him know of her current symptoms  and for reevaluation.  Supportive care recommendations were provided to the patient along with indications of when to go to the emergency department.  Patient advised to follow-up as needed. Final Clinical Impressions(s) / UC Diagnoses   Final diagnoses:  Symptoms of urinary tract infection     Discharge Instructions      -Your urinalysis is suggestive of a urinary tract infection.  I am starting you on antibiotics.  A urine culture is pending.  If your urine culture results are negative, you will be contacted and asked to stop the medication. -Take medications as prescribed.  You can continue the Pyridium you currently as needed over the next 3 days. -Increase fluids. -May take Tylenol for pain, fever, or general discomfort. -Develop a toileting schedule that will allow you to toilet at least every 2 hours. -Avoid caffeine to include tea, soda, and coffee. -Follow-up in the emergency department if you develop fever, chills, worsening abdominal  pain, blood in your urine, flank pain, or other concerns.  -Please follow-up with Dr. Louis Meckel to let him know you are being treated for a urinary tract infection within the next 24 to 48 hours and for reevaluation..     ED Prescriptions     Medication Sig Dispense Auth. Provider   nitrofurantoin, macrocrystal-monohydrate, (MACROBID) 100 MG capsule Take 1 capsule (100 mg total) by mouth 2 (two) times daily. 10 capsule Bronte Sabado-Warren, Alda Lea, NP      PDMP not reviewed this encounter.   Tish Men, NP 08/14/21 1940

## 2021-08-14 NOTE — Patient Instructions (Signed)
Description   Take 3 tablets of warfarin today and 2 tablets of warfarin tomorrow, then continue to take warfarin 1.5 tablets daily except for 2 tablets on Sundays and Thursdays. Recheck INR in 1 week.

## 2021-08-14 NOTE — Discharge Instructions (Addendum)
-  Your urinalysis is suggestive of a urinary tract infection.  I am starting you on antibiotics.  A urine culture is pending.  If your urine culture results are negative, you will be contacted and asked to stop the medication. -Take medications as prescribed.  You can continue the Pyridium you currently as needed over the next 3 days. -Increase fluids. -May take Tylenol for pain, fever, or general discomfort. -Develop a toileting schedule that will allow you to toilet at least every 2 hours. -Avoid caffeine to include tea, soda, and coffee. -Follow-up in the emergency department if you develop fever, chills, worsening abdominal pain, blood in your urine, flank pain, or other concerns.  -Please follow-up with Dr. Louis Meckel to let him know you are being treated for a urinary tract infection within the next 24 to 48 hours and for reevaluation.Marland Kitchen

## 2021-08-14 NOTE — ED Triage Notes (Signed)
Kidney ablation on 7/19.  States she had a stool accident yesterday and thinks she may have a UTI.  Burning on urination and frequency that started yesterday.

## 2021-08-15 ENCOUNTER — Ambulatory Visit: Payer: Medicare Other

## 2021-08-21 ENCOUNTER — Ambulatory Visit (INDEPENDENT_AMBULATORY_CARE_PROVIDER_SITE_OTHER): Payer: Medicare Other | Admitting: *Deleted

## 2021-08-21 DIAGNOSIS — I480 Paroxysmal atrial fibrillation: Secondary | ICD-10-CM

## 2021-08-21 DIAGNOSIS — Z5181 Encounter for therapeutic drug level monitoring: Secondary | ICD-10-CM | POA: Diagnosis not present

## 2021-08-21 LAB — POCT INR: INR: 2.3 (ref 2.0–3.0)

## 2021-08-21 NOTE — Patient Instructions (Signed)
Continue warfarin 1.5 tablets daily except for 2 tablets on Sundays and Thursdays. Recheck INR in 3 week.

## 2021-08-25 DIAGNOSIS — C642 Malignant neoplasm of left kidney, except renal pelvis: Secondary | ICD-10-CM | POA: Diagnosis not present

## 2021-08-25 DIAGNOSIS — N390 Urinary tract infection, site not specified: Secondary | ICD-10-CM | POA: Diagnosis not present

## 2021-09-01 ENCOUNTER — Ambulatory Visit: Payer: Self-pay | Admitting: Licensed Clinical Social Worker

## 2021-09-01 ENCOUNTER — Encounter: Payer: Self-pay | Admitting: Licensed Clinical Social Worker

## 2021-09-01 ENCOUNTER — Telehealth: Payer: Self-pay | Admitting: Licensed Clinical Social Worker

## 2021-09-01 DIAGNOSIS — Z1379 Encounter for other screening for genetic and chromosomal anomalies: Secondary | ICD-10-CM

## 2021-09-01 NOTE — Progress Notes (Signed)
HPI:  Ms. Arkin was previously seen in the [facility] due to a personal and family history of cancer and concerns regarding a hereditary predisposition to cancer. Please refer to our prior cancer genetics clinic note for more information regarding our discussion, assessment and recommendations, at the time. Ms. Abella recent genetic test results were disclosed to her, as were recommendations warranted by these results. These results and recommendations are discussed in more detail below.  CANCER HISTORY:  Oncology History   No history exists.    FAMILY HISTORY:  We obtained a detailed, 4-generation family history.  Significant diagnoses are listed below: Family History  Problem Relation Age of Onset   Breast cancer Mother    Diabetes Mother    Obesity Sister    Asthma Sister    Eczema Sister    COPD Father        smoked   Prostate cancer Father    Breast cancer Maternal Aunt    Urticaria Maternal Grandfather    Colon cancer Neg Hx    Ms. Santoyo has 1 sister, 84, with no history of cancer.    Ms. Barretta mother had breast cancer in her 48s-80s and passed at 69. Patient has a maternal aunt who also had breast cancer in her 66s. Maternal grandmother had liver cancer in her 36s.   Ms. Dickman father had prostate cancer at 82 and passed at 70. Paternal cousin had thyroid cancer, another cousin had unknown cancer.    Ms. Haberman is unaware of previous family history of genetic testing for hereditary cancer risks. There is no reported Ashkenazi Jewish ancestry. There is no known consanguinity.      GENETIC TEST RESULTS: Genetic testing reported out on 08/28/2021 through the [facility] Multi- cancer panel found no pathogenic mutations.   The Multi-Cancer Panel offered by [facility] includes sequencing and/or deletion duplication testing of the following 84 genes: AIP, ALK, APC, ATM, AXIN2,BAP1,  BARD1, BLM, BMPR1A, BRCA1, BRCA2, BRIP1, CASR, CDC73, CDH1, CDK4,  CDKN1B, CDKN1C, CDKN2A (p14ARF), CDKN2A (p16INK4a), CEBPA, CHEK2, CTNNA1, DICER1, DIS3L2, EGFR (c.2369C>T, p.Thr790Met variant only), EPCAM (Deletion/duplication testing only), FH, FLCN, GATA2, GPC3, GREM1 (Promoter region deletion/duplication testing only), HOXB13 (c.251G>A, p.Gly84Glu), HRAS, KIT, MAX, MEN1, MET, MITF (c.952G>A, p.Glu318Lys variant only), MLH1, MSH2, MSH3, MSH6, MUTYH, NBN, NF1, NF2, NTHL1, PALB2, PDGFRA, PHOX2B, PMS2, POLD1, POLE, POT1, PRKAR1A, PTCH1, PTEN, RAD50, RAD51C, RAD51D, RB1, RECQL4, RET, RUNX1, SDHAF2, SDHA (sequence changes only), SDHB, SDHC, SDHD, SMAD4, SMARCA4, SMARCB1, SMARCE1, STK11, SUFU, TERC, TERT, TMEM127, TP53, TSC1, TSC2, VHL, WRN and WT1.  The test report has been scanned into EPIC and is located under the Molecular Pathology section of the Results Review tab.  A portion of the result report is included below for reference.     We discussed that because current genetic testing is not perfect, it is possible there may be a gene mutation in one of these genes that current testing cannot detect, but that chance is small.  There could be another gene that has not yet been discovered, or that we have not yet tested, that is responsible for the cancer diagnoses in the family. It is also possible there is a hereditary cause for the cancer in the family that Ms. Balsley did not inherit and therefore was not identified in her testing.  Therefore, it is important to remain in touch with cancer genetics in the future so that we can continue to offer Ms. Gilmore the most up to date genetic testing.  ADDITIONAL GENETIC TESTING: We discussed with Ms. Linford that her genetic testing was fairly extensive.  If there are genes identified to increase cancer risk that can be analyzed in the future, we would be happy to discuss and coordinate this testing at that time.    CANCER SCREENING RECOMMENDATIONS: Ms. Defino test result is considered negative (normal).  This means that  we have not identified a hereditary cause for her  personal and family history of cancer at this time. Most cancers happen by chance and this negative test suggests that her cancer may fall into this category.    While reassuring, this does not definitively rule out a hereditary predisposition to cancer. It is still possible that there could be genetic mutations that are undetectable by current technology. There could be genetic mutations in genes that have not been tested or identified to increase cancer risk.  Therefore, it is recommended she continue to follow the cancer management and screening guidelines provided by her oncology and primary healthcare provider.   An individual's cancer risk and medical management are not determined by genetic test results alone. Overall cancer risk assessment incorporates additional factors, including personal medical history, family history, and any available genetic information that may result in a personalized plan for cancer prevention and surveillance.  RECOMMENDATIONS FOR FAMILY MEMBERS:  Relatives in this family might be at some increased risk of developing cancer, over the general population risk, simply due to the family history of cancer.  We recommended female relatives in this family have a yearly mammogram beginning at age 15, or 73 years younger than the earliest onset of cancer, an annual clinical breast exam, and perform monthly breast self-exams. Female relatives in this family should also have a gynecological exam as recommended by their primary provider.  All family members should be referred for colonoscopy starting at age 41.   FOLLOW-UP: Lastly, we discussed with Ms. Labrum that cancer genetics is a rapidly advancing field and it is possible that new genetic tests will be appropriate for her and/or her family members in the future. We encouraged her to remain in contact with cancer genetics on an annual basis so we can update her personal and  family histories and let her know of advances in cancer genetics that may benefit this family.   Our contact number was provided. Ms. Haman questions were answered to her satisfaction, and she knows she is welcome to call us at anytime with additional questions or concerns.   Faith Rogue, MS, [facility] Genetic Counselor [email or web address] Phone: [phone]

## 2021-09-01 NOTE — Telephone Encounter (Signed)
Revealed negative genetic testing.  We discussed that we do not know why she has had cancer or why there is cancer in the family. It could be due to a different gene that we are not testing, or something our current technology cannot pick up.  It will be important for her to keep in contact with genetics to learn if additional testing may be needed in the future.

## 2021-09-03 ENCOUNTER — Telehealth: Payer: Self-pay | Admitting: Cardiology

## 2021-09-03 DIAGNOSIS — C180 Malignant neoplasm of cecum: Secondary | ICD-10-CM | POA: Diagnosis not present

## 2021-09-03 DIAGNOSIS — Z1331 Encounter for screening for depression: Secondary | ICD-10-CM | POA: Diagnosis not present

## 2021-09-03 NOTE — Telephone Encounter (Signed)
   Pre-operative Risk Assessment    Patient Name: Melissa Velazquez  DOB: 27-Sep-1950 MRN: [id number]      Request for Surgical Clearance    Procedure:   laparoscopic vs open right hemicolectomy  Date of Surgery:  Clearance TBD                                 Surgeon:  Nadeen Landau  Surgeon's Group or Practice Name:  [facility]  Phone number:  [phone] Fax number:  [phone]   Type of Clearance Requested:   - Pharmacy:  Hold Warfarin (Coumadin) ?   Type of Anesthesia:  General    Additional requests/questions:    Sandrea Hammond   09/03/2021, 2:13 PM

## 2021-09-04 NOTE — Telephone Encounter (Signed)
Patient with diagnosis of atrial fibrillation on warfarin for anticoagulation.    Procedure: laparoscopic vs open right hemicolectomy Date of procedure: TBD   CHA2DS2-VASc Score = 3   This indicates a 3.2% annual risk of stroke. The patient's score is based upon: CHF History: 0 HTN History: 1 Diabetes History: 0 Stroke History: 0 Vascular Disease History: 0 Age Score: 1 Gender Score: 1   CrCl 96 Platelet count 146  Per office protocol, patient can hold warfarin for 5 days prior to procedure.   Patient will not need bridging with Lovenox (enoxaparin) around procedure.  Patient followed by our [facility] - left message in appt notes for 8/24 appt to make RN aware  **This guidance is not considered finalized until pre-operative APP has relayed final recommendations.**

## 2021-09-04 NOTE — Telephone Encounter (Signed)
   Patient Name: Melissa Velazquez  DOB: October 01, 1950 MRN: [id number]  Primary Cardiologist: Carlyle Dolly, MD  Clinical pharmacists have reviewed the patient's past medical history, labs, and current medications as part of preoperative protocol coverage. The following recommendations have been made:  Patient with diagnosis of atrial fibrillation on warfarin for anticoagulation.     Procedure: laparoscopic vs open right hemicolectomy Date of procedure: TBD     CHA2DS2-VASc Score = 3   This indicates a 3.2% annual risk of stroke. The patient's score is based upon: CHF History: 0 HTN History: 1 Diabetes History: 0 Stroke History: 0 Vascular Disease History: 0 Age Score: 1 Gender Score: 1     CrCl 96 Platelet count 146   Per office protocol, patient can hold warfarin for 5 days prior to procedure.  Patient will not need bridging with Lovenox (enoxaparin) around procedure.   Patient followed by our [facility] - left message in appt notes for 8/24 appt to make RN aware   I will route this recommendation to the requesting party via [place] fax function and remove from pre-op pool.  Please call with questions.  Lenna Sciara, NP 09/04/2021, 11:08 AM

## 2021-09-05 ENCOUNTER — Ambulatory Visit
Admission: RE | Admit: 2021-09-05 | Discharge: 2021-09-05 | Disposition: A | Discharge status: Home / Self Care | Payer: Medicare Other | Source: Ambulatory Visit | Attending: Interventional Radiology | Admitting: Interventional Radiology

## 2021-09-05 DIAGNOSIS — C642 Malignant neoplasm of left kidney, except renal pelvis: Secondary | ICD-10-CM | POA: Diagnosis not present

## 2021-09-05 DIAGNOSIS — Z9889 Other specified postprocedural states: Secondary | ICD-10-CM | POA: Diagnosis not present

## 2021-09-05 HISTORY — PX: IR RADIOLOGIST EVAL & MGMT: IMG5224

## 2021-09-05 NOTE — Progress Notes (Signed)
Chief Complaint: Patient was consulted remotely today (TeleHealth) for follow up after left renal cryoablation.  History of Present Illness: Melissa Velazquez is a 71 y.o. female with a history of partial nephrectomy for a left lower pole clear cell renal carcinoma in 2018. Marginal recurrence was noted by follow-up imaging measuring approximately 12 mm. Due to close proximity of the recurrence to the ureteropelvic junction and proximal ureter, a ureteral stent was placed prior to ablation on 08/01/2021 by Dr. Louis Meckel. Cryoablation was successfully performed on 08/06/21.  She was seen for symptoms of urinary frequency, urgency and bladder pressure on 08/14/2021 with urinalysis consistent with urinary tract infection.  After treatment with antibiotics, all urinary symptoms have cleared and she currently has no urinary symptoms.  The left ureteral stent was removed by Dr. Louis Meckel on 08/25/2021 and she has not developed any left flank pain since stent removal.  A follow up CT of the chest, abdomen and pelvis was performed on 08/13/21 due to cecal mass/polyp biopsy on 07/25/21 at the time of colonoscopy demonstrating intramucosal adenocarcinoma/high-grade dysplasia.  She has been evaluated by Dr. Dema Severin for right hemicolectomy which is being planned for sometime in September.  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    Past Surgical History:   Procedure Laterality Date   APPENDECTOMY  01/19/2006   BIOPSY  12/14/2016   Procedure: BIOPSY;  Surgeon: Rogene Houston, MD;  [facility];  Service: Endoscopy;;  colon   BIOPSY  07/25/2021   Procedure: BIOPSY;  Surgeon: Harvel Quale, MD;  [facility];  Service: Gastroenterology;;   CHOLECYSTECTOMY  01/19/2006   COLONOSCOPY N/A 11/24/2012   Procedure: COLONOSCOPY;  Surgeon: Rogene Houston, MD;  [facility];  Service: Endoscopy;  Laterality: N/A;  830   COLONOSCOPY N/A 12/14/2016   Procedure: COLONOSCOPY;  Surgeon: Rogene Houston, MD;  [facility];  Service: Endoscopy;  Laterality: N/A;  12:00   COLONOSCOPY  07/25/2021   Bx of polyp CA +   COLONOSCOPY WITH PROPOFOL N/A 07/25/2021   Procedure: COLONOSCOPY WITH PROPOFOL;  Surgeon: Harvel Quale, MD;  [facility];  Service: Gastroenterology;  Laterality: N/A;  115 ASA 1   IR RADIOLOGIST EVAL & MGMT  06/23/2021   POLYPECTOMY  12/14/2016   Procedure: POLYPECTOMY;  Surgeon: Rogene Houston, MD;  [facility];  Service: Endoscopy;;  colon   POLYPECTOMY  07/25/2021   Procedure: POLYPECTOMY;  Surgeon: Harvel Quale, MD;  [facility];  Service: Gastroenterology;;   RADIOLOGY WITH ANESTHESIA N/A 08/06/2021   Procedure: IR WITH ANESTHESIA CRYOABLATION;  Surgeon: Aletta Edouard, MD;  Location: [facility];  Service: Radiology;  Laterality: N/A;   ROBOTIC ASSITED PARTIAL NEPHRECTOMY Left 08/21/2016   Procedure: XI ROBOTIC ASSITED PARTIAL NEPHRECTOMY;  Surgeon: Ardis Hughs, MD;  Location: [facility];  Service: Urology;  Laterality: Left;   TOTAL ABDOMINAL HYSTERECTOMY  01/20/1999   TOTAL KNEE ARTHROPLASTY Bilateral 01/24/2020   Procedure: TOTAL KNEE BILATERAL;  Surgeon: Gaynelle Arabian, MD;  Location: [facility];  Service: Orthopedics;  Laterality: Bilateral;   WISDOM TOOTH EXTRACTION      Allergies: Triple antibiotic  [bacitracin-neomycin-polymyxin], Levaquin [levofloxacin], Oxycodone, Capsaicin, Elastic bandages & [zinc], and Nickel  Medications: Prior to Admission medications   Medication Sig Start Date End Date Taking? Authorizing Provider  acetaminophen (TYLENOL) 500 MG tablet Take 500 mg by mouth every 6 (six) hours as needed for moderate pain.    [provider]  albuterol (PROVENTIL HFA;VENTOLIN HFA) 108 (90 Base) MCG/ACT inhaler Inhale 2 puffs into the lungs every 6 (six) hours as needed for wheezing or shortness of breath.    [provider]  Calcium-Magnesium-Vitamin D (CALCIUM 1200+D3 PO) Take 1 tablet by mouth once a week.    [provider]  cetirizine (ZYRTEC) 10 MG tablet Take 10 mg by mouth daily as needed for allergies.    [provider]  Cholecalciferol (VITAMIN D3) 125 MCG (5000 UT) CAPS Take 5,000 Units by mouth daily.    [provider]  dicyclomine (BENTYL) 20 MG tablet Take 20 mg by mouth 3 (three) times daily as needed for spasms.    [provider]  fluticasone (FLONASE) 50 MCG/ACT nasal spray Place 1 spray into both nostrils 2 (two) times daily as needed for allergies or rhinitis. 12/22/19   Valentina Shaggy, MD  HYDROcodone-acetaminophen (NORCO/VICODIN) 5-325 MG tablet Take 1-2 tablets by mouth every 4 (four) hours as needed for moderate pain. 08/07/21   Boisseau, Hayley, PA  hydrocortisone (ANUSOL-HC) 2.5 % rectal cream Apply topically 4 (four) times daily as needed. 05/27/21   [provider]  levothyroxine (SYNTHROID, LEVOTHROID) 75 MCG tablet Take 75 mcg by mouth daily before breakfast.    [provider]  metoprolol tartrate (LOPRESSOR) 25 MG tablet Take 0.5 tablets (12.5 mg total) by mouth as needed (for palpitations). 02/20/20   Richardson Dopp T, PA-C  nitrofurantoin, macrocrystal-monohydrate, (MACROBID) 100 MG capsule Take 1 capsule (100 mg total) by mouth 2 (two) times daily. 08/14/21   Leath-Warren, Alda Lea, NP  ondansetron (ZOFRAN) 8 MG tablet Take 8 mg by mouth every 8 (eight) hours as needed. 04/19/21   [provider]  phenazopyridine (PYRIDIUM) 200 MG tablet Take 200 mg by mouth every 8 (eight) hours as needed. 08/01/21   [provider]  sertraline (ZOLOFT) 100 MG tablet Take 150 mg by mouth daily with breakfast.    [provider]  traMADol (ULTRAM) 50 MG tablet Take 50-100 mg by mouth every 6 (six) hours as needed. 08/01/21   [provider]  triamcinolone cream (KENALOG) 0.1 % Apply 1 application topically daily as needed (rash).    [provider]  warfarin (COUMADIN) 5 MG tablet Take 1 1/2 - 2 tablets daily as directed by coumadin clinic Patient taking differently: Take 7.5-10 mg by mouth See admin instructions. Take 1 1/2 - 2 tablets daily as directed by coumadin clinic; 10 mg Tues Thurs and Sat, 7.5 mg all other days 06/26/21   Arnoldo Lenis, MD     Family History  Problem Relation Age of Onset   Breast  cancer Mother    Diabetes Mother    Obesity Sister    Asthma Sister    Eczema Sister    COPD Father        smoked   Prostate cancer Father    Breast cancer Maternal Aunt    Urticaria Maternal Grandfather    Colon cancer Neg Hx     Social History   Socioeconomic History   Marital status: Single    Spouse name: Not on file   Number of children: 0   Years of education: Not on file   Highest education level: Not on file  Occupational History   Occupation: Psychologist  Tobacco Use   Smoking status: Former    Packs/day: 1.00    Years: 20.00    Total pack years: 20.00    Types: Cigarettes    Quit date: 01/19/1998    Years since quitting: 23.6    Passive exposure: Past   Smokeless tobacco: Never  Vaping Use   Vaping Use: Never used  Substance and Sexual Activity   Alcohol use: Yes    Alcohol/week: 0.0 standard drinks of alcohol    Comment: rare   Drug use: No   Sexual activity: Not Currently  Other Topics Concern    Not on file  Social History Narrative   2 cups of caffeine daily    Social Determinants of Health   Financial Resource Strain: Not on file  Food Insecurity: Not on file  Transportation Needs: Not on file  Physical Activity: Not on file  Stress: Not on file  Social Connections: Not on file    ECOG Status: 0 - Asymptomatic  Review of Systems  Constitutional: Negative.   Respiratory: Negative.    Cardiovascular: Negative.   Gastrointestinal: Negative.   Genitourinary: Negative.   Musculoskeletal: Negative.   Neurological: Negative.     Review of Systems: A 12 point ROS discussed and pertinent positives are indicated in the HPI above.  All other systems are negative.   Physical Exam No direct physical exam was performed (except for noted visual exam findings with Video Visits).   Vital Signs: There were no vitals taken for this visit.  Imaging: CT CHEST ABDOMEN PELVIS W CONTRAST  Result Date: 08/14/2021 CLINICAL DATA:  Recent colonoscopy demonstrating a 30 mm polyp in the cecum. Prior kidney cancer. * Tracking Code: BO * EXAM: CT CHEST, ABDOMEN, AND PELVIS WITH CONTRAST TECHNIQUE: Multidetector CT imaging of the chest, abdomen and pelvis was performed following the standard protocol during bolus administration of intravenous contrast. RADIATION DOSE REDUCTION: This exam was performed according to the departmental dose-optimization program which includes automated exposure control, adjustment of the mA and/or kV according to patient size and/or use of iterative reconstruction technique. CONTRAST:  145m OMNIPAQUE IOHEXOL 300 MG/ML  SOLN COMPARISON:  Colonoscopy report of 07/25/2021 prior abdominopelvic CT 01/06/2021. Remote chest CT of 03/28/2007 FINDINGS: CT CHEST FINDINGS Cardiovascular: Aortic atherosclerosis. Normal heart size, without pericardial effusion. No central pulmonary embolism, on this non-dedicated study. Mediastinum/Nodes: No supraclavicular adenopathy.  Hypoattenuating right thyroid nodule of 11 mm on 07/02. Not clinically significant; no follow-up imaging recommended (ref: J Am Coll Radiol. 2015 Feb;12(2): 143-50) No mediastinal or hilar adenopathy. Lungs/Pleura: No pleural fluid. 2 mm left lower lobe pulmonary nodule on 90/4 was present in 2009 and presumed benign. A subpleural superior segment left lower lobe 2 mm nodule on 40/4 is likely present on the prior but more apparent today secondary to thinner slice collimation. Musculoskeletal: No acute osseous  abnormality. CT ABDOMEN PELVIS FINDINGS Hepatobiliary: Variant lateral segment left liver lobe extending into the left upper quadrant. Cholecystectomy, without biliary ductal dilatation. Pancreas: Normal, without mass or ductal dilatation. Spleen: Normal in size, without focal abnormality. Adrenals/Urinary Tract: Normal adrenal glands.  Normal right kidney. Interval ablation of the previously described inter/lower pole left renal local recurrence. Example 72/2. No evidence of residual abnormal enhancement. Trace perinephric complex fluid adjacent the lateral upper and interpolar regions including on 63/2, new. Ureteric stent in place, without hydronephrosis. Otherwise normal urinary bladder. Stomach/Bowel: Normal stomach, without wall thickening. Scattered colonic diverticula. No dominant mass identified within the right-sided colon. Normal terminal ileum. Proximal and mid small bowel loops are mildly dilated, including at 3.3 cm on 79/2. Concurrent mild wall thickening. Relatively gradual transition to normal caliber mid and distal ileum. No surrounding mesenteric abnormality. No pneumatosis or free intraperitoneal air. Vascular/Lymphatic: Aortic atherosclerosis. No abdominopelvic adenopathy. Reproductive: Hysterectomy.  No adnexal mass. Other: No significant free fluid. Mild pelvic floor laxity. No evidence of omental or peritoneal disease. Musculoskeletal: Degenerative changes of both hips. IMPRESSION: 1. No  evidence of dominant colonic mass. No metastatic disease within the chest, abdomen, or pelvis. 2. Interval ablation of the local recurrence involving the inter/lower pole left kidney. Trace pericapsular complex fluid is likely hematoma. 3. Proximal and mid small bowel dilatation and wall thickening. Favor enteritis with secondary mild localized ileus. Low-grade partial small bowel obstruction could look similar. 4.  Aortic Atherosclerosis (ICD10-I70.0). Electronically Signed   By: Abigail Miyamoto M.D.   On: 08/14/2021 14:53   CT GUIDE TISSUE ABLATION  Result Date: 08/06/2021 CLINICAL DATA:  History of partial nephrectomy for a left lower pole clear cell renal carcinoma in 2018. Marginal recurrence noted by follow-up imaging and the patient presents for cryoablation of the recurrence. Due to close proximity of the recurrence to the ureteropelvic junction and proximal ureter, a ureteral stent was placed prior to ablation on 08/01/2021. EXAM: CT-GUIDED PERCUTANEOUS CRYOABLATION OF LEFT RENAL CARCINOMA ANESTHESIA/SEDATION: General MEDICATIONS: 2 g IV cefoxitin. The antibiotic was administered in an appropriate time interval prior to needle puncture of the skin. CONTRAST:  No intravenous contrast administered. PROCEDURE: The procedure, risks, benefits, and alternatives were explained to the patient. Questions regarding the procedure were encouraged and answered. The patient understands and consents to the procedure. A time-out was performed prior to initiating the procedure. The patient was placed under general anesthesia. Initial unenhanced CT was performed in a prone position to localize the left kidney. Hydrodissection fluid was created by combining 500 mL of sterile saline with 10 mL of Omnipaque 300 contrast. During the procedure, hydrodissection was performed with approximately a total volume of 300 mL of the hydrodissection fluid. This was administered via 2 separate [place] needles advanced into the left  retroperitoneal space between the level of the ureteral stent and kidney. The left flank region was prepped with chlorhexidine in a sterile fashion, and a sterile drape was applied covering the operative field. A sterile gown and sterile gloves were used for the procedure. Under CT guidance, a Ice Pearl CX percutaneous cryoablation probe was advanced into the region left renal carcinoma recurrence. Probe positioning was confirmed by CT prior to cryoablation. Cryoablation was performed through the single probe. Initial 10 minute cycle of cryoablation was performed. This was followed by a 8 minute thaw cycle. A second 10 minute cycle of cryoablation was then performed. During ablation, periodic CT imaging was performed to monitor ice ball formation and morphology. After  active thaw, the cryoablation probe was removed. COMPLICATIONS: None FINDINGS: Hydrodissection was performed to try to create some increased space between the level of tumor recurrence and the region of the UPJ and proximal ureter. This was partially successful in creating some more space but due to some probable scar tissue from prior surgery, the ureteral stent did not separate much from the kidney despite injection of fluid. Ultimately, enough space was created to allow safe cryoablation of the nodular recurrence and adequate ice ball formation was visualized by CT. IMPRESSION: CT guided percutaneous cryoablation performed of left renal carcinoma recurrence. Electronically Signed   By: Aletta Edouard M.D.   On: 08/06/2021 16:06    Labs:  CBC: Recent Labs    07/28/21 1459 08/07/21 0556  WBC 4.6 6.2  HGB 14.5 12.2  HCT 43.4 37.0  PLT 178 146*    COAGS: Recent Labs    07/16/21 1121 07/28/21 1459 08/14/21 1132 08/21/21 1148  INR 3.6* 1.2 1.3* 2.3    BMP: Recent Labs    01/06/21 0908 07/28/21 1459 08/07/21 0556  NA  --  143 143  K  --  4.5 4.1  CL  --  108 110  CO2  --  28 27  GLUCOSE  --  114* 107*  BUN  --  14 12   CALCIUM  --  9.6 8.7*  CREATININE 1.00 1.03* 0.81  GFRNONAA  --  58* >60     Assessment and Plan:  I spoke with Ms. Noland over the phone.  We reviewed the CT performed on 08/13/2021 that demonstrates initial appropriate appearing ablation defect at the site of the prior medial carcinoma recurrence without evidence of residual enhancement.  The ablation zone abuts the proximal aspect of the indwelling ureteral stent.  There is no evidence of hydronephrosis.  No complication is evident with a tiny rim of subcapsular fluid present around the lateral aspect of the kidney which may relate to some residual hydrodissection fluid as a fairly large amount of fluid was injected at the time of the procedure to decrease the possibility of scarring of the renal pelvis and proximal ureter at the time of ablation.  I recommended a follow-up MRI in approximately 6 months after she has fully recovered from hemicolectomy.  MRI will be more sensitive in evaluating for renal carcinoma recurrence and she had some nausea and vomiting after the recent CT after contrast administration and would prefer to avoid CT studies with contrast, if at all possible.   Electronically Signed: Azzie Roup 09/05/2021, 9:37 AM    I spent a total of 10 Minutes in remote  clinical consultation, greater than 50% of which was counseling/coordinating care post cryoablation of a left renal carcinoma recurrence.    Visit type: Audio only (telephone). Audio (no video) only due to patient's lack of internet/smartphone capability. Alternative for in-person consultation at [facility], [address] [place], [place], [place]. This visit type was conducted due to national recommendations for restrictions regarding the COVID-19 Pandemic (e.g. social distancing).  This format is felt to be most appropriate for this patient at this time.  All issues noted in this document were discussed and addressed.

## 2021-09-08 ENCOUNTER — Other Ambulatory Visit: Payer: Medicare Other

## 2021-09-08 ENCOUNTER — Encounter: Payer: Medicare Other | Admitting: Genetic Counselor

## 2021-09-09 ENCOUNTER — Other Ambulatory Visit: Payer: Self-pay | Admitting: Urology

## 2021-09-11 ENCOUNTER — Ambulatory Visit (INDEPENDENT_AMBULATORY_CARE_PROVIDER_SITE_OTHER): Payer: Medicare Other | Admitting: *Deleted

## 2021-09-11 DIAGNOSIS — I480 Paroxysmal atrial fibrillation: Secondary | ICD-10-CM

## 2021-09-11 DIAGNOSIS — Z5181 Encounter for therapeutic drug level monitoring: Secondary | ICD-10-CM

## 2021-09-11 LAB — POCT INR: INR: 3.1 — AB (ref 2.0–3.0)

## 2021-09-11 NOTE — Patient Instructions (Signed)
Take warfarin 1 tablet tonight then resume 1.5 tablets daily except for 2 tablets on Sundays and Thursdays. Recheck INR in 3 week.  Pending Rt hemi colectomy on 10/09/21 Will hold warfarin 5 days before procedure.  Does not need Lovenox bridge.

## 2021-09-15 DIAGNOSIS — L439 Lichen planus, unspecified: Secondary | ICD-10-CM | POA: Diagnosis not present

## 2021-09-18 DIAGNOSIS — E039 Hypothyroidism, unspecified: Secondary | ICD-10-CM | POA: Diagnosis not present

## 2021-09-18 DIAGNOSIS — M1991 Primary osteoarthritis, unspecified site: Secondary | ICD-10-CM | POA: Diagnosis not present

## 2021-09-18 DIAGNOSIS — I10 Essential (primary) hypertension: Secondary | ICD-10-CM | POA: Diagnosis not present

## 2021-09-24 DIAGNOSIS — Z1231 Encounter for screening mammogram for malignant neoplasm of breast: Secondary | ICD-10-CM | POA: Diagnosis not present

## 2021-09-26 NOTE — Progress Notes (Signed)
Sent message, via epic in basket, requesting orders in epic from surgeon.  

## 2021-09-29 ENCOUNTER — Ambulatory Visit: Payer: Self-pay | Admitting: Surgery

## 2021-10-02 ENCOUNTER — Ambulatory Visit: Payer: Medicare Other | Attending: Cardiology | Admitting: *Deleted

## 2021-10-02 DIAGNOSIS — I480 Paroxysmal atrial fibrillation: Secondary | ICD-10-CM | POA: Insufficient documentation

## 2021-10-02 DIAGNOSIS — Z5181 Encounter for therapeutic drug level monitoring: Secondary | ICD-10-CM | POA: Insufficient documentation

## 2021-10-02 LAB — POCT INR: INR: 3.4 — AB (ref 2.0–3.0)

## 2021-10-02 NOTE — Patient Instructions (Signed)
DUE TO COVID-19 ONLY TWO VISITORS  (aged 70 and older)  ARE ALLOWED TO COME WITH YOU AND STAY IN THE WAITING ROOM ONLY DURING PRE OP AND PROCEDURE.   **NO VISITORS ARE ALLOWED IN THE SHORT STAY AREA OR RECOVERY ROOM!!**  IF YOU WILL BE ADMITTED INTO THE HOSPITAL YOU ARE ALLOWED ONLY FOUR SUPPORT PEOPLE DURING VISITATION HOURS ONLY (7 AM -8PM)   The support person(s) must pass our screening, gel in and out, and wear a mask at all times, including in the patient's room. Patients must also wear a mask when staff or their support person are in the room. Visitors GUEST BADGE MUST BE WORN VISIBLY  One adult visitor may remain with you overnight and MUST be in the room by 8 P.M.     Your procedure is scheduled on: 10/09/21   Report to [facility] Main Entrance    Report to admitting at : 9:45 AM   Call this number if you have problems the morning of surgery [phone]   Clear liquids starting the day before surgery until: 9:00 AM DAY OF SURGERY  Water Black Coffee (sugar ok, NO MILK/CREAM OR CREAMERS)  Tea (sugar ok, NO MILK/CREAM OR CREAMERS) regular and decaf                             Plain Jell-O (NO RED)                                           Fruit ices (not with fruit pulp, NO RED)                                     Popsicles (NO RED)                                                                  Juice: apple, WHITE grape, WHITE cranberry Sports drinks like Gatorade (NO RED)              Drink 2 Ensure/G2 drinks AT 10:00 PM the night before surgery.     The day of surgery:  Drink ONE (1) Pre-Surgery Clear Ensure or G2 at AM the morning of surgery. Drink in one sitting. Do not sip.  This drink was given to you during your hospital  pre-op appointment visit. Nothing else to drink after completing the  Pre-Surgery Clear Ensure or G2.          If you have questions, please contact your surgeon's office.   FOLLOW BOWEL PREP AND ANY ADDITIONAL PRE OP INSTRUCTIONS  YOU RECEIVED FROM YOUR SURGEON'S OFFICE!!!    Oral Hygiene is also important to reduce your risk of infection.                                    Remember - BRUSH YOUR TEETH THE MORNING OF SURGERY WITH YOUR REGULAR TOOTHPASTE   Do NOT smoke after Midnight   Take these  medicines the morning of surgery with A SIP OF WATER:   DO NOT TAKE ANY ORAL DIABETIC MEDICATIONS DAY OF YOUR SURGERY  Bring CPAP mask and tubing day of surgery.                              You may not have any metal on your body including hair pins, jewelry, and body piercing             Do not wear make-up, lotions, powders, perfumes/cologne, or deodorant  Do not wear nail polish including gel and S&S, artificial/acrylic nails, or any other type of covering on natural nails including finger and toenails. If you have artificial nails, gel coating, etc. that needs to be removed by a nail salon please have this removed prior to surgery or surgery may need to be canceled/ delayed if the surgeon/ anesthesia feels like they are unable to be safely monitored.   Do not shave  48 hours prior to surgery.               Men may shave face and neck.   Do not bring valuables to the hospital. [place].   Contacts, dentures or bridgework may not be worn into surgery.   Bring small overnight bag day of surgery.   DO NOT [place]. PHARMACY WILL DISPENSE MEDICATIONS LISTED ON YOUR MEDICATION LIST TO YOU DURING YOUR ADMISSION [place]!    Patients discharged on the day of surgery will not be allowed to drive home.  Someone NEEDS to stay with you for the first 24 hours after anesthesia.   Special Instructions: Bring a copy of your healthcare power of attorney and living will documents         the day of surgery if you haven't scanned them before.              Please read over the following fact sheets you were given: IF YOU HAVE QUESTIONS  ABOUT YOUR PRE-OP INSTRUCTIONS PLEASE CALL [phone]     [facility] - Preparing for Surgery Before surgery, you can play an important role.  Because skin is not sterile, your skin needs to be as free of germs as possible.  You can reduce the number of germs on your skin by washing with CHG (chlorahexidine gluconate) soap before surgery.  CHG is an antiseptic cleaner which kills germs and bonds with the skin to continue killing germs even after washing. Please DO NOT use if you have an allergy to CHG or antibacterial soaps.  If your skin becomes reddened/irritated stop using the CHG and inform your nurse when you arrive at Short Stay. Do not shave (including legs and underarms) for at least 48 hours prior to the first CHG shower.  You may shave your face/neck. Please follow these instructions carefully:  1.  Shower with CHG Soap the night before surgery and the  morning of Surgery.  2.  If you choose to wash your hair, wash your hair first as usual with your  normal  shampoo.  3.  After you shampoo, rinse your hair and body thoroughly to remove the  shampoo.  4.  Use CHG as you would any other liquid soap.  You can apply chg directly  to the skin and wash                       Gently with a scrungie or clean washcloth.  5.  Apply the CHG Soap to your body ONLY FROM THE NECK DOWN.   Do not use on face/ open                           Wound or open sores. Avoid contact with eyes, ears mouth and genitals (private parts).                       Wash face,  Genitals (private parts) with your normal soap.             6.  Wash thoroughly, paying special attention to the area where your surgery  will be performed.  7.  Thoroughly rinse your body with warm water from the neck down.  8.  DO NOT shower/wash with your normal soap after using and rinsing off  the CHG Soap.                9.  Pat yourself dry with a clean towel.            10.  Wear clean pajamas.            11.  Place  clean sheets on your bed the night of your first shower and do not  sleep with pets. Day of Surgery : Do not apply any lotions/deodorants the morning of surgery.  Please wear clean clothes to the hospital/surgery center.  FAILURE TO FOLLOW THESE INSTRUCTIONS MAY RESULT IN THE CANCELLATION OF YOUR SURGERY PATIENT SIGNATURE_________________________________  NURSE SIGNATURE__________________________________  ________________________________________________________________________

## 2021-10-02 NOTE — Patient Instructions (Signed)
Take warfarin 1 tablet tonight then resume 1.5 tablets daily except for 2 tablets on Sundays and Thursdays. Pending Rt hemi colectomy on 10/09/21 Will hold warfarin 5 days before procedure. (Hold 9/16 - 9/20)  Does not need Lovenox bridge. Recheck 1 wk after procedure

## 2021-10-03 ENCOUNTER — Encounter (HOSPITAL_COMMUNITY): Payer: Self-pay

## 2021-10-03 ENCOUNTER — Encounter (HOSPITAL_COMMUNITY)
Admission: RE | Admit: 2021-10-03 | Discharge: 2021-10-03 | Disposition: A | Discharge status: Home / Self Care | Payer: Medicare Other | Source: Ambulatory Visit | Attending: Surgery | Admitting: Surgery

## 2021-10-03 ENCOUNTER — Other Ambulatory Visit: Payer: Self-pay

## 2021-10-03 VITALS — BP 123/79 | HR 64 | Temp 97.6°F | Wt 199.0 lb

## 2021-10-03 DIAGNOSIS — J45909 Unspecified asthma, uncomplicated: Secondary | ICD-10-CM | POA: Diagnosis not present

## 2021-10-03 DIAGNOSIS — N189 Chronic kidney disease, unspecified: Secondary | ICD-10-CM | POA: Insufficient documentation

## 2021-10-03 DIAGNOSIS — Z85528 Personal history of other malignant neoplasm of kidney: Secondary | ICD-10-CM | POA: Diagnosis not present

## 2021-10-03 DIAGNOSIS — Z87891 Personal history of nicotine dependence: Secondary | ICD-10-CM | POA: Diagnosis not present

## 2021-10-03 DIAGNOSIS — Z01812 Encounter for preprocedural laboratory examination: Secondary | ICD-10-CM | POA: Insufficient documentation

## 2021-10-03 DIAGNOSIS — Z7901 Long term (current) use of anticoagulants: Secondary | ICD-10-CM | POA: Insufficient documentation

## 2021-10-03 DIAGNOSIS — Z9049 Acquired absence of other specified parts of digestive tract: Secondary | ICD-10-CM | POA: Insufficient documentation

## 2021-10-03 DIAGNOSIS — Z8542 Personal history of malignant neoplasm of other parts of uterus: Secondary | ICD-10-CM | POA: Diagnosis not present

## 2021-10-03 DIAGNOSIS — I129 Hypertensive chronic kidney disease with stage 1 through stage 4 chronic kidney disease, or unspecified chronic kidney disease: Secondary | ICD-10-CM | POA: Insufficient documentation

## 2021-10-03 DIAGNOSIS — I4891 Unspecified atrial fibrillation: Secondary | ICD-10-CM | POA: Diagnosis not present

## 2021-10-03 DIAGNOSIS — C189 Malignant neoplasm of colon, unspecified: Secondary | ICD-10-CM | POA: Diagnosis not present

## 2021-10-03 DIAGNOSIS — I10 Essential (primary) hypertension: Secondary | ICD-10-CM

## 2021-10-03 DIAGNOSIS — Z905 Acquired absence of kidney: Secondary | ICD-10-CM | POA: Insufficient documentation

## 2021-10-03 DIAGNOSIS — Z01818 Encounter for other preprocedural examination: Secondary | ICD-10-CM

## 2021-10-03 LAB — COMPREHENSIVE METABOLIC PANEL
ALT: 18 U/L (ref 0–44)
AST: 20 U/L (ref 15–41)
Albumin: 3.9 g/dL (ref 3.5–5.0)
Alkaline Phosphatase: 58 U/L (ref 38–126)
Anion gap: 6 (ref 5–15)
BUN: 19 mg/dL (ref 8–23)
CO2: 26 mmol/L (ref 22–32)
Calcium: 9 mg/dL (ref 8.9–10.3)
Chloride: 107 mmol/L (ref 98–111)
Creatinine, Ser: 0.94 mg/dL (ref 0.44–1.00)
GFR, Estimated: >60 mL/min (ref >60)
Glucose, Bld: 89 mg/dL (ref 70–99)
Potassium: 4.1 mmol/L (ref 3.5–5.1)
Sodium: 139 mmol/L (ref 135–145)
Total Bilirubin: 0.5 mg/dL (ref 0.3–1.2)
Total Protein: 6.8 g/dL (ref 6.5–8.1)

## 2021-10-03 LAB — CBC
HCT: 42.6 % (ref 36.0–46.0)
Hemoglobin: 14.1 g/dL (ref 12.0–15.0)
MCH: 31.5 pg (ref 26.0–34.0)
MCHC: 33.1 g/dL (ref 30.0–36.0)
MCV: 95.3 fL (ref 80.0–100.0)
Platelets: 155 10*3/uL (ref 150–400)
RBC: 4.47 MIL/uL (ref 3.87–5.11)
RDW: 13 % (ref 11.5–15.5)
WBC: 4.6 10*3/uL (ref 4.0–10.5)
nRBC: 0 % (ref 0.0–0.2)

## 2021-10-03 NOTE — Progress Notes (Signed)
For Short Stay: [place] appointment date: Date of COVID positive in last 70 days:  Bowel Prep reminder:   For Anesthesia: PCP - Dr. Al Pimple Cardiologist - Dr. Carlyle Dolly Clearance: Danny Lawless Monge: PA: 09/04/21. : EPIC Chest x-ray - 07/29/21 EKG - 05/16/21 Stress Test -  ECHO - 01/29/20 Cardiac Cath -  Pacemaker/ICD device last checked: Pacemaker orders received: Device Rep notified:  Spinal Cord Stimulator:  Sleep Study -  CPAP -   Fasting Blood Sugar -  Checks Blood Sugar _____ times a day Date and result of last Hgb A1c-  Blood Thinner Instructions:Coumadin can be hold 5 days before surgery,will not need bridging with Lovenox: Dr. Harl Bowie. Pt. Is aware of this instructions. Aspirin Instructions: Last Dose:  Activity level: Can go up a flight of stairs and activities of daily living without stopping and without chest pain and/or shortness of breath   Able to exercise without chest pain and/or shortness of breath   Unable to go up a flight of stairs without chest pain and/or shortness of breath     Anesthesia review: Hx: Dysrhythmias,HTN.  Patient denies shortness of breath, fever, cough and chest pain at PAT appointment   Patient verbalized understanding of instructions that were given to them at the PAT appointment. Patient was also instructed that they will need to review over the PAT instructions again at home before surgery.

## 2021-10-06 DIAGNOSIS — Z23 Encounter for immunization: Secondary | ICD-10-CM | POA: Diagnosis not present

## 2021-10-06 NOTE — Progress Notes (Signed)
Anesthesia Chart Review   Case: [id number] Date/Time: 10/09/21 1145   Procedures:      LAPAROSCOPIC VS OPEN RIGHT HEMI COLECTOMY (Right)     CYSTOSCOPY WITH STENT PLACEMENT   Anesthesia type: General   Pre-op diagnosis: colon cancer   Location: [facility] ROOM 02 / [facility]   Surgeons: Ileana Roup, MD; Ardis Hughs, MD       DISCUSSION:71 y.o. former smoker with h/o HTN, asthma, CKD s/p partial nephrectomy, atrial fibrillation (on Coumadin), colon cancer scheduled for above procedure 10/09/2021 with Dr. Nadeen Landau and Dr. Louis Meckel.   S/p IR cryoablation and colonoscopy.  Cleared by cardiology 07/10/2021 prior to these procedures.  Per preoperative evaluation, "She does not have a history of ischemic heart disease, stroke, and does not require insulin. She is low risk to proceed with both procedures. Recommend resuming coumadin between procedures. Also recommended taking low dose metoprolol prior to procedures.    Therefore, based on ACC/AHA guidelines, the patient would be at acceptable risk for the planned procedure without further cardiovascular testing."  Pt cleared to hold Warfarin 5 days prior to upcoming procedure with no Lovenox bridge needed.   Anticipate pt can proceed with planned procedure barring acute status change.   VS: BP 123/79   Pulse 64   Temp 36.4 C (Oral)   Wt 90.3 kg   SpO2 98%   BMI 34.16 kg/m   PROVIDERS: [name], MD is PCP   Cardiologist - Dr. Carlyle Dolly LABS: Labs reviewed: Acceptable for surgery. (all labs ordered are listed, but only abnormal results are displayed)  Labs Reviewed  COMPREHENSIVE METABOLIC PANEL  CBC  TYPE AND SCREEN     IMAGES:   EKG:   CV: Echo 01/29/2020 1. Left ventricular ejection fraction, by estimation, is 60 to 65%. The  left ventricle has normal function. The left ventricle has no regional  wall motion abnormalities. There is mild concentric left ventricular  hypertrophy and  moderate basal septal  hypertrophy.   2. Right ventricular systolic function is mildly reduced. The right  ventricular size is normal. There is moderately elevated pulmonary artery  systolic pressure. The estimated right ventricular systolic pressure is  64.4 mmHg.   3. Large pleural effusion in the left lateral region.   4. The mitral valve is normal in structure. No evidence of mitral valve  regurgitation. No evidence of mitral stenosis.   5. The aortic valve is tricuspid. Aortic valve regurgitation is not  visualized. Mild aortic valve sclerosis is present, with no evidence of  aortic valve stenosis. Aortic valve area, by VTI measures 2.41 cm. Aortic  valve mean gradient measures 4.0 mmHg.   Aortic valve Vmax measures 1.51 m/s.   6. The inferior vena cava is dilated in size with <50% respiratory  variability, suggesting right atrial pressure of 15 mmHg.   7. Tricuspid valve regurgitation is mild to moderate.  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    Past Surgical History:  Procedure Laterality Date   APPENDECTOMY  01/19/2006   BIOPSY  12/14/2016   Procedure: BIOPSY;  Surgeon: Rogene Houston, MD;  [facility];  Service: Endoscopy;;  colon   BIOPSY  07/25/2021   Procedure: BIOPSY;  Surgeon: Harvel Quale, MD;  [facility];  Service: Gastroenterology;;   CHOLECYSTECTOMY  01/19/2006    COLONOSCOPY N/A 11/24/2012   Procedure: COLONOSCOPY;  Surgeon: Rogene Houston, MD;  [facility];  Service: Endoscopy;  Laterality: N/A;  830   COLONOSCOPY N/A 12/14/2016   Procedure: COLONOSCOPY;  Surgeon: Rogene Houston, MD;  [facility];  Service: Endoscopy;  Laterality: N/A;  12:00   COLONOSCOPY  07/25/2021   Bx of polyp CA +   COLONOSCOPY WITH PROPOFOL N/A 07/25/2021   Procedure: COLONOSCOPY WITH PROPOFOL;  Surgeon: Harvel Quale, MD;  [facility];  Service: Gastroenterology;  Laterality: N/A;  115 ASA 1   IR RADIOLOGIST EVAL & MGMT  06/23/2021   IR RADIOLOGIST EVAL & MGMT  09/05/2021   POLYPECTOMY  12/14/2016   Procedure: POLYPECTOMY;  Surgeon: Rogene Houston, MD;  [facility];  Service: Endoscopy;;  colon   POLYPECTOMY  07/25/2021   Procedure: POLYPECTOMY;  Surgeon: Harvel Quale, MD;  [facility];  Service: Gastroenterology;;   RADIOLOGY WITH ANESTHESIA N/A 08/06/2021   Procedure: IR WITH ANESTHESIA CRYOABLATION;  Surgeon: Aletta Edouard, MD;  [facility];  Service: Radiology;  Laterality: N/A;   ROBOTIC ASSITED PARTIAL NEPHRECTOMY Left 08/21/2016   Procedure: XI ROBOTIC ASSITED PARTIAL NEPHRECTOMY;  Surgeon: Ardis Hughs, MD;  [facility];  Service: Urology;  Laterality: Left;   TOTAL ABDOMINAL HYSTERECTOMY  01/20/1999   TOTAL KNEE ARTHROPLASTY Bilateral 01/24/2020   Procedure: TOTAL KNEE BILATERAL;  Surgeon: Gaynelle Arabian, MD;  [facility];  Service: Orthopedics;  Laterality: Bilateral;   WISDOM TOOTH EXTRACTION      MEDICATIONS:  albuterol (PROVENTIL HFA;VENTOLIN HFA) 108 (90 Base) MCG/ACT inhaler   cetirizine (ZYRTEC) 10 MG tablet   Cholecalciferol (VITAMIN D3) 125 MCG (5000 UT) CAPS   fluconazole (DIFLUCAN) 150 MG tablet   fluticasone (FLONASE) 50 MCG/ACT nasal spray   HYDROcodone-acetaminophen (NORCO/VICODIN) 5-325 MG tablet   hydrocortisone (ANUSOL-HC) 2.5 %  rectal cream   levothyroxine (SYNTHROID, LEVOTHROID) 75 MCG tablet   magic mouthwash SOLN   metoprolol tartrate (LOPRESSOR) 25 MG tablet   nitrofurantoin, macrocrystal-monohydrate, (MACROBID) 100 MG capsule   ondansetron (ZOFRAN) 8 MG tablet   Polyethyl Glycol-Propyl Glycol (SYSTANE OP)   sertraline (ZOLOFT) 100 MG tablet   simethicone (MYLICON) 037 MG chewable tablet   triamcinolone ointment (KENALOG) 0.5 %   warfarin (COUMADIN) 5 MG tablet   No current facility-administered medications for this encounter.   Konrad Felix Ward, PA-C [facility] [phone]

## 2021-10-09 ENCOUNTER — Inpatient Hospital Stay (HOSPITAL_COMMUNITY): Payer: Medicare Other | Admitting: Certified Registered"

## 2021-10-09 ENCOUNTER — Inpatient Hospital Stay (HOSPITAL_COMMUNITY): Payer: Medicare Other | Admitting: Physician Assistant

## 2021-10-09 ENCOUNTER — Inpatient Hospital Stay (HOSPITAL_COMMUNITY)
Admission: RE | Admit: 2021-10-09 | Discharge: 2021-10-13 | DRG: 331 | Disposition: A | Discharge status: Home / Self Care | Payer: Medicare Other | Attending: Surgery | Admitting: Surgery

## 2021-10-09 ENCOUNTER — Inpatient Hospital Stay (HOSPITAL_COMMUNITY): Payer: Medicare Other

## 2021-10-09 ENCOUNTER — Other Ambulatory Visit: Payer: Self-pay

## 2021-10-09 ENCOUNTER — Encounter (HOSPITAL_COMMUNITY): Payer: Self-pay | Admitting: Surgery

## 2021-10-09 ENCOUNTER — Encounter (HOSPITAL_COMMUNITY)
Admission: RE | Disposition: A | Discharge status: Home / Self Care | Payer: Self-pay | Source: Home / Self Care | Attending: Surgery

## 2021-10-09 DIAGNOSIS — Z8719 Personal history of other diseases of the digestive system: Secondary | ICD-10-CM | POA: Diagnosis not present

## 2021-10-09 DIAGNOSIS — E039 Hypothyroidism, unspecified: Secondary | ICD-10-CM | POA: Diagnosis present

## 2021-10-09 DIAGNOSIS — Z91048 Other nonmedicinal substance allergy status: Secondary | ICD-10-CM

## 2021-10-09 DIAGNOSIS — Z7901 Long term (current) use of anticoagulants: Secondary | ICD-10-CM

## 2021-10-09 DIAGNOSIS — K644 Residual hemorrhoidal skin tags: Secondary | ICD-10-CM | POA: Diagnosis present

## 2021-10-09 DIAGNOSIS — Z79890 Hormone replacement therapy (postmenopausal): Secondary | ICD-10-CM | POA: Diagnosis not present

## 2021-10-09 DIAGNOSIS — Z905 Acquired absence of kidney: Secondary | ICD-10-CM

## 2021-10-09 DIAGNOSIS — F419 Anxiety disorder, unspecified: Secondary | ICD-10-CM | POA: Diagnosis present

## 2021-10-09 DIAGNOSIS — Z8542 Personal history of malignant neoplasm of other parts of uterus: Secondary | ICD-10-CM | POA: Diagnosis not present

## 2021-10-09 DIAGNOSIS — Z87891 Personal history of nicotine dependence: Secondary | ICD-10-CM

## 2021-10-09 DIAGNOSIS — F32A Depression, unspecified: Secondary | ICD-10-CM | POA: Diagnosis present

## 2021-10-09 DIAGNOSIS — Z923 Personal history of irradiation: Secondary | ICD-10-CM | POA: Diagnosis not present

## 2021-10-09 DIAGNOSIS — J3089 Other allergic rhinitis: Secondary | ICD-10-CM | POA: Diagnosis present

## 2021-10-09 DIAGNOSIS — K219 Gastro-esophageal reflux disease without esophagitis: Secondary | ICD-10-CM | POA: Diagnosis present

## 2021-10-09 DIAGNOSIS — J449 Chronic obstructive pulmonary disease, unspecified: Secondary | ICD-10-CM

## 2021-10-09 DIAGNOSIS — Z9049 Acquired absence of other specified parts of digestive tract: Secondary | ICD-10-CM

## 2021-10-09 DIAGNOSIS — C180 Malignant neoplasm of cecum: Secondary | ICD-10-CM | POA: Diagnosis not present

## 2021-10-09 DIAGNOSIS — C182 Malignant neoplasm of ascending colon: Principal | ICD-10-CM | POA: Diagnosis present

## 2021-10-09 DIAGNOSIS — Z79899 Other long term (current) drug therapy: Secondary | ICD-10-CM

## 2021-10-09 DIAGNOSIS — Z85528 Personal history of other malignant neoplasm of kidney: Secondary | ICD-10-CM | POA: Diagnosis not present

## 2021-10-09 DIAGNOSIS — E669 Obesity, unspecified: Secondary | ICD-10-CM | POA: Diagnosis present

## 2021-10-09 DIAGNOSIS — K648 Other hemorrhoids: Secondary | ICD-10-CM | POA: Diagnosis present

## 2021-10-09 DIAGNOSIS — J4520 Mild intermittent asthma, uncomplicated: Secondary | ICD-10-CM | POA: Diagnosis present

## 2021-10-09 DIAGNOSIS — C189 Malignant neoplasm of colon, unspecified: Secondary | ICD-10-CM | POA: Diagnosis not present

## 2021-10-09 DIAGNOSIS — M170 Bilateral primary osteoarthritis of knee: Secondary | ICD-10-CM | POA: Diagnosis present

## 2021-10-09 DIAGNOSIS — Z6834 Body mass index (BMI) 34.0-34.9, adult: Secondary | ICD-10-CM | POA: Diagnosis not present

## 2021-10-09 DIAGNOSIS — E785 Hyperlipidemia, unspecified: Secondary | ICD-10-CM | POA: Diagnosis present

## 2021-10-09 DIAGNOSIS — Z881 Allergy status to other antibiotic agents status: Secondary | ICD-10-CM

## 2021-10-09 DIAGNOSIS — Z803 Family history of malignant neoplasm of breast: Secondary | ICD-10-CM

## 2021-10-09 DIAGNOSIS — I4891 Unspecified atrial fibrillation: Secondary | ICD-10-CM

## 2021-10-09 DIAGNOSIS — K660 Peritoneal adhesions (postprocedural) (postinfection): Secondary | ICD-10-CM | POA: Diagnosis present

## 2021-10-09 DIAGNOSIS — I10 Essential (primary) hypertension: Secondary | ICD-10-CM | POA: Diagnosis not present

## 2021-10-09 DIAGNOSIS — Z885 Allergy status to narcotic agent status: Secondary | ICD-10-CM

## 2021-10-09 HISTORY — PX: LAPAROSCOPIC RIGHT HEMI COLECTOMY: SHX5926

## 2021-10-09 HISTORY — PX: CYSTOSCOPY WITH STENT PLACEMENT: SHX5790

## 2021-10-09 LAB — CBC
HCT: 39.7 % (ref 36.0–46.0)
Hemoglobin: 12.6 g/dL (ref 12.0–15.0)
MCH: 31.2 pg (ref 26.0–34.0)
MCHC: 31.7 g/dL (ref 30.0–36.0)
MCV: 98.3 fL (ref 80.0–100.0)
Platelets: 137 10*3/uL — ABNORMAL LOW (ref 150–400)
RBC: 4.04 MIL/uL (ref 3.87–5.11)
RDW: 12.9 % (ref 11.5–15.5)
WBC: 9.1 10*3/uL (ref 4.0–10.5)
nRBC: 0 % (ref 0.0–0.2)

## 2021-10-09 LAB — TYPE AND SCREEN
ABO/RH(D): A POS
Antibody Screen: NEGATIVE

## 2021-10-09 LAB — PROTIME-INR
INR: 1.2 (ref 0.8–1.2)
Prothrombin Time: 14.9 seconds (ref 11.4–15.2)

## 2021-10-09 LAB — CREATININE, SERUM
Creatinine, Ser: 0.98 mg/dL (ref 0.44–1.00)
GFR, Estimated: >60 mL/min (ref >60)

## 2021-10-09 LAB — APTT: aPTT: 29 seconds (ref 24–36)

## 2021-10-09 SURGERY — LAPAROSCOPIC RIGHT HEMI COLECTOMY
Anesthesia: General | Laterality: Right

## 2021-10-09 MED ORDER — HYDROMORPHONE HCL 1 MG/ML IJ SOLN
INTRAMUSCULAR | Status: AC
  Administered 2021-10-09: 0.5 mg via INTRAVENOUS
  Filled 2021-10-09: qty 1

## 2021-10-09 MED ORDER — HYDRALAZINE HCL 20 MG/ML IJ SOLN: 10.0000 mg | INTRAMUSCULAR | Status: DC | PRN

## 2021-10-09 MED ORDER — TRIAMCINOLONE ACETONIDE 0.5 % EX OINT: 1.0000 | TOPICAL_OINTMENT | Freq: Two times a day (BID) | CUTANEOUS | Status: DC | PRN

## 2021-10-09 MED ORDER — ENSURE SURGERY PO LIQD
237.0000 mL | Freq: Two times a day (BID) | ORAL | Status: DC
  Administered 2021-10-10 – 2021-10-13 (×7): 237 mL via ORAL

## 2021-10-09 MED ORDER — LEVOTHYROXINE SODIUM 75 MCG PO TABS
75.0000 ug | ORAL_TABLET | Freq: Every day | ORAL | Status: DC
  Administered 2021-10-10 – 2021-10-13 (×4): 75 ug via ORAL
  Filled 2021-10-09 (×4): qty 1

## 2021-10-09 MED ORDER — ACETAMINOPHEN 500 MG PO TABS
ORAL_TABLET | ORAL | Status: AC
  Administered 2021-10-09: 1000 mg via ORAL
  Filled 2021-10-09: qty 2

## 2021-10-09 MED ORDER — MIDAZOLAM HCL 2 MG/2ML IJ SOLN
INTRAMUSCULAR | Status: DC | PRN
  Administered 2021-10-09: 2 mg via INTRAVENOUS

## 2021-10-09 MED ORDER — ROCURONIUM BROMIDE 10 MG/ML (PF) SYRINGE
PREFILLED_SYRINGE | INTRAVENOUS | Status: AC
  Filled 2021-10-09: qty 10

## 2021-10-09 MED ORDER — SODIUM CHLORIDE 0.9 % IV SOLN
2.0000 g | Freq: Once | INTRAVENOUS | Status: AC
  Administered 2021-10-09: 2 g via INTRAVENOUS

## 2021-10-09 MED ORDER — ONDANSETRON HCL 4 MG/2ML IJ SOLN
INTRAMUSCULAR | Status: DC | PRN
  Administered 2021-10-09: 4 mg via INTRAVENOUS

## 2021-10-09 MED ORDER — BUPIVACAINE LIPOSOME 1.3 % IJ SUSP
INTRAMUSCULAR | Status: DC | PRN
  Administered 2021-10-09: 20 mL

## 2021-10-09 MED ORDER — SODIUM CHLORIDE 0.9 % IV SOLN
INTRAVENOUS | Status: AC
  Filled 2021-10-09: qty 2

## 2021-10-09 MED ORDER — ONDANSETRON HCL 4 MG/2ML IJ SOLN: 4.0000 mg | Freq: Four times a day (QID) | INTRAMUSCULAR | Status: DC | PRN

## 2021-10-09 MED ORDER — POLYVINYL ALCOHOL 1.4 % OP SOLN: Freq: Four times a day (QID) | OPHTHALMIC | Status: DC | PRN

## 2021-10-09 MED ORDER — MAGIC MOUTHWASH: 5.0000 mL | Freq: Two times a day (BID) | ORAL | Status: DC | PRN

## 2021-10-09 MED ORDER — BUPIVACAINE-EPINEPHRINE 0.25% -1:200000 IJ SOLN
INTRAMUSCULAR | Status: DC | PRN
  Administered 2021-10-09: 30 mL

## 2021-10-09 MED ORDER — FLUTICASONE PROPIONATE 50 MCG/ACT NA SUSP: 1.0000 | Freq: Two times a day (BID) | NASAL | Status: DC | PRN

## 2021-10-09 MED ORDER — MEPERIDINE HCL 50 MG/ML IJ SOLN: 6.2500 mg | INTRAMUSCULAR | Status: DC | PRN

## 2021-10-09 MED ORDER — IBUPROFEN 400 MG PO TABS: 600.0000 mg | ORAL_TABLET | Freq: Four times a day (QID) | ORAL | Status: DC | PRN

## 2021-10-09 MED ORDER — FLUCONAZOLE 150 MG PO TABS: 150.0000 mg | ORAL_TABLET | ORAL | Status: DC

## 2021-10-09 MED ORDER — LORATADINE 10 MG PO TABS
10.0000 mg | ORAL_TABLET | Freq: Every day | ORAL | Status: DC
  Administered 2021-10-10 – 2021-10-13 (×4): 10 mg via ORAL
  Filled 2021-10-09 (×4): qty 1

## 2021-10-09 MED ORDER — LACTATED RINGERS IR SOLN
Status: DC | PRN
  Administered 2021-10-09: 1000 mL

## 2021-10-09 MED ORDER — ALVIMOPAN 12 MG PO CAPS: 12.0000 mg | ORAL_CAPSULE | ORAL | Status: AC

## 2021-10-09 MED ORDER — PHENYLEPHRINE HCL-NACL 20-0.9 MG/250ML-% IV SOLN
INTRAVENOUS | Status: DC | PRN
  Administered 2021-10-09: 20 ug/min via INTRAVENOUS

## 2021-10-09 MED ORDER — ONDANSETRON HCL 4 MG PO TABS: 4.0000 mg | ORAL_TABLET | Freq: Four times a day (QID) | ORAL | Status: DC | PRN

## 2021-10-09 MED ORDER — BUPIVACAINE-EPINEPHRINE (PF) 0.25% -1:200000 IJ SOLN
INTRAMUSCULAR | Status: AC
  Filled 2021-10-09: qty 30

## 2021-10-09 MED ORDER — ACETAMINOPHEN 500 MG PO TABS: 1000.0000 mg | ORAL_TABLET | Freq: Once | ORAL | Status: AC

## 2021-10-09 MED ORDER — HEPARIN SODIUM (PORCINE) 5000 UNIT/ML IJ SOLN: 5000.0000 [IU] | Freq: Once | INTRAMUSCULAR | Status: AC

## 2021-10-09 MED ORDER — ALUM & MAG HYDROXIDE-SIMETH 200-200-20 MG/5ML PO SUSP: 30.0000 mL | Freq: Four times a day (QID) | ORAL | Status: DC | PRN

## 2021-10-09 MED ORDER — ALVIMOPAN 12 MG PO CAPS
ORAL_CAPSULE | ORAL | Status: AC
  Administered 2021-10-09: 12 mg via ORAL
  Filled 2021-10-09: qty 1

## 2021-10-09 MED ORDER — PROMETHAZINE HCL 25 MG/ML IJ SOLN
INTRAMUSCULAR | Status: AC
  Administered 2021-10-09: 6.25 mg via INTRAVENOUS
  Filled 2021-10-09: qty 1

## 2021-10-09 MED ORDER — ORAL CARE MOUTH RINSE: 15.0000 mL | Freq: Once | OROMUCOSAL | Status: AC

## 2021-10-09 MED ORDER — HYDROMORPHONE HCL 1 MG/ML IJ SOLN
0.5000 mg | INTRAMUSCULAR | Status: DC | PRN
  Administered 2021-10-09 – 2021-10-10 (×4): 0.5 mg via INTRAVENOUS
  Filled 2021-10-09 (×4): qty 0.5

## 2021-10-09 MED ORDER — HYDROMORPHONE HCL 1 MG/ML IJ SOLN
0.2500 mg | INTRAMUSCULAR | Status: DC | PRN
  Administered 2021-10-09: 0.5 mg via INTRAVENOUS

## 2021-10-09 MED ORDER — ACETAMINOPHEN 500 MG PO TABS: 1000.0000 mg | ORAL_TABLET | Freq: Four times a day (QID) | ORAL | Status: DC | PRN

## 2021-10-09 MED ORDER — DEXAMETHASONE SODIUM PHOSPHATE 10 MG/ML IJ SOLN
INTRAMUSCULAR | Status: DC | PRN
  Administered 2021-10-09: 8 mg via INTRAVENOUS

## 2021-10-09 MED ORDER — SODIUM CHLORIDE (PF) 0.9 % IJ SOLN
INTRAMUSCULAR | Status: AC
  Filled 2021-10-09: qty 50

## 2021-10-09 MED ORDER — HYDROCORTISONE (PERIANAL) 2.5 % EX CREA: 1.0000 | TOPICAL_CREAM | Freq: Four times a day (QID) | CUTANEOUS | Status: DC | PRN

## 2021-10-09 MED ORDER — IOHEXOL 300 MG/ML  SOLN
INTRAMUSCULAR | Status: DC | PRN
  Administered 2021-10-09: 10 mL

## 2021-10-09 MED ORDER — ALBUTEROL SULFATE (2.5 MG/3ML) 0.083% IN NEBU: 3.0000 mL | INHALATION_SOLUTION | Freq: Four times a day (QID) | RESPIRATORY_TRACT | Status: DC | PRN

## 2021-10-09 MED ORDER — SIMETHICONE 80 MG PO CHEW: 80.0000 mg | CHEWABLE_TABLET | Freq: Four times a day (QID) | ORAL | Status: DC | PRN

## 2021-10-09 MED ORDER — PROPOFOL 10 MG/ML IV BOLUS
INTRAVENOUS | Status: AC
  Filled 2021-10-09: qty 20

## 2021-10-09 MED ORDER — ONDANSETRON HCL 4 MG/2ML IJ SOLN
INTRAMUSCULAR | Status: AC
  Filled 2021-10-09: qty 2

## 2021-10-09 MED ORDER — DIPHENHYDRAMINE HCL 50 MG/ML IJ SOLN: 12.5000 mg | Freq: Four times a day (QID) | INTRAMUSCULAR | Status: DC | PRN

## 2021-10-09 MED ORDER — FENTANYL CITRATE (PF) 100 MCG/2ML IJ SOLN
INTRAMUSCULAR | Status: AC
  Filled 2021-10-09: qty 2

## 2021-10-09 MED ORDER — LACTATED RINGERS IV SOLN: INTRAVENOUS | Status: DC

## 2021-10-09 MED ORDER — SIMETHICONE 80 MG PO CHEW: 40.0000 mg | CHEWABLE_TABLET | Freq: Four times a day (QID) | ORAL | Status: DC | PRN

## 2021-10-09 MED ORDER — DEXMEDETOMIDINE HCL IN NACL 80 MCG/20ML IV SOLN
INTRAVENOUS | Status: DC | PRN
  Administered 2021-10-09: 4 ug via BUCCAL
  Administered 2021-10-09: 8 ug via BUCCAL

## 2021-10-09 MED ORDER — ACETAMINOPHEN 500 MG PO TABS
1000.0000 mg | ORAL_TABLET | Freq: Four times a day (QID) | ORAL | Status: DC
  Administered 2021-10-09 – 2021-10-13 (×13): 1000 mg via ORAL
  Filled 2021-10-09 (×15): qty 2

## 2021-10-09 MED ORDER — PROPOFOL 10 MG/ML IV BOLUS
INTRAVENOUS | Status: DC | PRN
  Administered 2021-10-09: 100 mg via INTRAVENOUS

## 2021-10-09 MED ORDER — OXYCODONE HCL 5 MG/5ML PO SOLN: 5.0000 mg | Freq: Once | ORAL | Status: DC | PRN

## 2021-10-09 MED ORDER — MIDAZOLAM HCL 2 MG/2ML IJ SOLN
INTRAMUSCULAR | Status: AC
  Filled 2021-10-09: qty 2

## 2021-10-09 MED ORDER — ALVIMOPAN 12 MG PO CAPS
12.0000 mg | ORAL_CAPSULE | Freq: Two times a day (BID) | ORAL | Status: DC
  Administered 2021-10-10: 12 mg via ORAL
  Filled 2021-10-09 (×2): qty 1

## 2021-10-09 MED ORDER — ROCURONIUM BROMIDE 10 MG/ML (PF) SYRINGE
PREFILLED_SYRINGE | INTRAVENOUS | Status: DC | PRN
  Administered 2021-10-09: 20 mg via INTRAVENOUS
  Administered 2021-10-09: 60 mg via INTRAVENOUS
  Administered 2021-10-09: 40 mg via INTRAVENOUS

## 2021-10-09 MED ORDER — SERTRALINE HCL 50 MG PO TABS
150.0000 mg | ORAL_TABLET | Freq: Every day | ORAL | Status: DC
  Administered 2021-10-10 – 2021-10-13 (×4): 150 mg via ORAL
  Filled 2021-10-09 (×4): qty 1

## 2021-10-09 MED ORDER — FENTANYL CITRATE (PF) 250 MCG/5ML IJ SOLN
INTRAMUSCULAR | Status: DC | PRN
  Administered 2021-10-09: 50 ug via INTRAVENOUS
  Administered 2021-10-09: 100 ug via INTRAVENOUS
  Administered 2021-10-09: 50 ug via INTRAVENOUS

## 2021-10-09 MED ORDER — BUPIVACAINE LIPOSOME 1.3 % IJ SUSP
INTRAMUSCULAR | Status: AC
  Filled 2021-10-09: qty 20

## 2021-10-09 MED ORDER — DIPHENHYDRAMINE HCL 12.5 MG/5ML PO ELIX: 12.5000 mg | ORAL_SOLUTION | Freq: Four times a day (QID) | ORAL | Status: DC | PRN

## 2021-10-09 MED ORDER — SODIUM CHLORIDE 0.9 % IR SOLN
Status: DC | PRN
  Administered 2021-10-09: 2000 mL

## 2021-10-09 MED ORDER — HEPARIN SODIUM (PORCINE) 5000 UNIT/ML IJ SOLN
5000.0000 [IU] | Freq: Three times a day (TID) | INTRAMUSCULAR | Status: DC
  Administered 2021-10-09 – 2021-10-12 (×8): 5000 [IU] via SUBCUTANEOUS
  Filled 2021-10-09 (×8): qty 1

## 2021-10-09 MED ORDER — DEXAMETHASONE SODIUM PHOSPHATE 10 MG/ML IJ SOLN
INTRAMUSCULAR | Status: AC
  Filled 2021-10-09: qty 1

## 2021-10-09 MED ORDER — EPHEDRINE SULFATE-NACL 50-0.9 MG/10ML-% IV SOSY
PREFILLED_SYRINGE | INTRAVENOUS | Status: DC | PRN
  Administered 2021-10-09: 5 mg via INTRAVENOUS
  Administered 2021-10-09: 10 mg via INTRAVENOUS
  Administered 2021-10-09 (×2): 5 mg via INTRAVENOUS

## 2021-10-09 MED ORDER — OXYCODONE HCL 5 MG PO TABS: 5.0000 mg | ORAL_TABLET | Freq: Once | ORAL | Status: DC | PRN

## 2021-10-09 MED ORDER — CHLORHEXIDINE GLUCONATE 0.12 % MT SOLN
15.0000 mL | Freq: Once | OROMUCOSAL | Status: AC
  Administered 2021-10-09: 15 mL via OROMUCOSAL

## 2021-10-09 MED ORDER — HEPARIN SODIUM (PORCINE) 5000 UNIT/ML IJ SOLN
INTRAMUSCULAR | Status: AC
  Administered 2021-10-09: 5000 [IU] via SUBCUTANEOUS
  Filled 2021-10-09: qty 1

## 2021-10-09 MED ORDER — METOPROLOL TARTRATE 12.5 MG HALF TABLET: 12.5000 mg | ORAL_TABLET | Freq: Two times a day (BID) | ORAL | Status: DC | PRN

## 2021-10-09 MED ORDER — POLYETHYL GLYCOL-PROPYL GLYCOL 0.4-0.3 % OP GEL: Freq: Four times a day (QID) | OPHTHALMIC | Status: DC | PRN

## 2021-10-09 MED ORDER — TRAMADOL HCL 50 MG PO TABS
50.0000 mg | ORAL_TABLET | Freq: Four times a day (QID) | ORAL | Status: DC | PRN
  Administered 2021-10-09 (×2): 50 mg via ORAL
  Filled 2021-10-09 (×3): qty 1

## 2021-10-09 MED ORDER — TRAMADOL HCL 50 MG PO TABS: 50.0000 mg | ORAL_TABLET | Freq: Four times a day (QID) | ORAL | 0 refills | Status: AC | PRN

## 2021-10-09 MED ORDER — LACTATED RINGERS IV SOLN: INTRAVENOUS | Status: DC | PRN

## 2021-10-09 MED ORDER — PROMETHAZINE HCL 25 MG/ML IJ SOLN: 6.2500 mg | INTRAMUSCULAR | Status: DC | PRN

## 2021-10-09 MED ORDER — MIDAZOLAM HCL 2 MG/2ML IJ SOLN: 0.5000 mg | Freq: Once | INTRAMUSCULAR | Status: DC | PRN

## 2021-10-09 MED ORDER — VITAMIN D3 25 MCG (1000 UNIT) PO TABS
5000.0000 [IU] | ORAL_TABLET | Freq: Every day | ORAL | Status: DC
  Administered 2021-10-10 – 2021-10-13 (×4): 5000 [IU] via ORAL
  Filled 2021-10-09 (×4): qty 5

## 2021-10-09 MED ORDER — EPHEDRINE 5 MG/ML INJ
INTRAVENOUS | Status: AC
  Filled 2021-10-09: qty 5

## 2021-10-09 MED ORDER — LIDOCAINE 2% (20 MG/ML) 5 ML SYRINGE
INTRAMUSCULAR | Status: DC | PRN
  Administered 2021-10-09: 40 mg via INTRAVENOUS

## 2021-10-09 SURGICAL SUPPLY — 72 items
ADAPTER GOLDBERG URETERAL (ADAPTER) IMPLANT
APPLIER CLIP ROT 10 11.4 M/L (STAPLE)
BAG COUNTER SPONGE SURGICOUNT (BAG) IMPLANT
BAG URO CATCHER STRL LF (MISCELLANEOUS) ×2 IMPLANT
BLADE CLIPPER SURG (BLADE) IMPLANT
CABLE HIGH FREQUENCY MONO STRZ (ELECTRODE) ×4 IMPLANT
CATH URETL OPEN 5X70 (CATHETERS) ×2 IMPLANT
CELLS DAT CNTRL 66122 CELL SVR (MISCELLANEOUS) IMPLANT
CHLORAPREP W/TINT 26 (MISCELLANEOUS) ×2 IMPLANT
CLIP APPLIE ROT 10 11.4 M/L (STAPLE) IMPLANT
CLOTH BEACON ORANGE TIMEOUT ST (SAFETY) ×2 IMPLANT
DISSECTOR BLUNT TIP ENDO 5MM (MISCELLANEOUS) IMPLANT
ELECT REM PT RETURN 15FT ADLT (MISCELLANEOUS) ×2 IMPLANT
GAUZE SPONGE 4X4 12PLY STRL (GAUZE/BANDAGES/DRESSINGS) ×2 IMPLANT
GLOVE BIO SURGEON STRL SZ7.5 (GLOVE) ×4 IMPLANT
GLOVE ECLIPSE 8.0 STRL XLNG CF (GLOVE) ×4 IMPLANT
GLOVE SURG LX STRL 7.5 STRW (GLOVE) ×2 IMPLANT
GOWN STRL REUS W/ TWL LRG LVL3 (GOWN DISPOSABLE) ×4 IMPLANT
GOWN STRL REUS W/ TWL XL LVL3 (GOWN DISPOSABLE) ×10 IMPLANT
GOWN STRL REUS W/TWL LRG LVL3 (GOWN DISPOSABLE) ×4
GOWN STRL REUS W/TWL XL LVL3 (GOWN DISPOSABLE) ×10
GUIDEWIRE STR DUAL SENSOR (WIRE) ×2 IMPLANT
IRRIG SUCT STRYKERFLOW 2 WTIP (MISCELLANEOUS)
IRRIGATION SUCT STRKRFLW 2 WTP (MISCELLANEOUS) IMPLANT
KIT TURNOVER KIT A (KITS) IMPLANT
LIGASURE IMPACT 36 18CM CVD LR (INSTRUMENTS) IMPLANT
MANIFOLD NEPTUNE II (INSTRUMENTS) ×2 IMPLANT
NS IRRIG 1000ML POUR BTL (IV SOLUTION) ×2 IMPLANT
PACK COLON (CUSTOM PROCEDURE TRAY) ×2 IMPLANT
PACK CYSTO (CUSTOM PROCEDURE TRAY) ×2 IMPLANT
PAD POSITIONING PINK XL (MISCELLANEOUS) IMPLANT
PENCIL SMOKE EVACUATOR (MISCELLANEOUS) IMPLANT
PROTECTOR NERVE ULNAR (MISCELLANEOUS) IMPLANT
RELOAD PROXIMATE 75MM BLUE (ENDOMECHANICALS) ×4 IMPLANT
RELOAD STAPLE 75 3.8 BLU REG (ENDOMECHANICALS) IMPLANT
RETRACTOR WND ALEXIS 18 MED (MISCELLANEOUS) IMPLANT
RTRCTR WOUND ALEXIS 18CM MED (MISCELLANEOUS)
SCISSORS LAP 5X35 DISP (ENDOMECHANICALS) ×2 IMPLANT
SEALER TISSUE G2 STRG ARTC 35C (ENDOMECHANICALS) IMPLANT
SET TUBE SMOKE EVAC HIGH FLOW (TUBING) ×2 IMPLANT
SHEARS HARMONIC ACE PLUS 36CM (ENDOMECHANICALS) IMPLANT
SLEEVE ADV FIXATION 5X100MM (TROCAR) ×6 IMPLANT
SPIKE FLUID TRANSFER (MISCELLANEOUS) ×2 IMPLANT
STAPLER 90 3.5 STAND SLIM (STAPLE) ×2
STAPLER 90 3.5 STD SLIM (STAPLE) IMPLANT
STAPLER GUN LINEAR PROX 60 (STAPLE) IMPLANT
STAPLER PROXIMATE 75MM BLUE (STAPLE) IMPLANT
STAPLER VISISTAT 35W (STAPLE) ×2 IMPLANT
SUT MNCRL AB 4-0 PS2 18 (SUTURE) ×2 IMPLANT
SUT PDS AB 1 CT1 27 (SUTURE) ×4 IMPLANT
SUT PROLENE 2 0 CT2 30 (SUTURE) IMPLANT
SUT PROLENE 2 0 KS (SUTURE) IMPLANT
SUT SILK 2 0 (SUTURE) ×2
SUT SILK 2 0 SH CR/8 (SUTURE) ×2 IMPLANT
SUT SILK 2-0 18XBRD TIE 12 (SUTURE) ×2 IMPLANT
SUT SILK 3 0 (SUTURE) ×2
SUT SILK 3 0 SH CR/8 (SUTURE) ×2 IMPLANT
SUT SILK 3-0 18XBRD TIE 12 (SUTURE) ×2 IMPLANT
SYS LAPSCP GELPORT 120MM (MISCELLANEOUS)
SYS WOUND ALEXIS 18CM MED (MISCELLANEOUS) ×2
SYSTEM LAPSCP GELPORT 120MM (MISCELLANEOUS) IMPLANT
SYSTEM WOUND ALEXIS 18CM MED (MISCELLANEOUS) ×2 IMPLANT
TAPE CLOTH 4X10 WHT NS (GAUZE/BANDAGES/DRESSINGS) IMPLANT
TOWEL OR 17X26 10 PK STRL BLUE (TOWEL DISPOSABLE) ×2 IMPLANT
TRAY FOLEY MTR SLVR 14FR STAT (SET/KITS/TRAYS/PACK) ×2 IMPLANT
TRAY FOLEY MTR SLVR 16FR STAT (SET/KITS/TRAYS/PACK) IMPLANT
TROCAR 11X100 Z THREAD (TROCAR) IMPLANT
TROCAR ADV FIXATION 12X100MM (TROCAR) ×2 IMPLANT
TROCAR ADV FIXATION 5X100MM (TROCAR) ×2 IMPLANT
TROCAR BALLN 12MMX100 BLUNT (TROCAR) ×2 IMPLANT
TUBING CONNECTING 10 (TUBING) ×4 IMPLANT
YANKAUER SUCT BULB TIP NO VENT (SUCTIONS) ×2 IMPLANT

## 2021-10-09 NOTE — Plan of Care (Signed)
Problem: Education: Goal: Understanding of discharge needs will improve Outcome: Progressing   Problem: Bowel/Gastric: Goal: Gastrointestinal status for postoperative course will improve Outcome: Progressing   Problem: Clinical Measurements: Goal: Postoperative complications will be avoided or minimized Outcome: Progressing   Problem: Pain Managment: Goal: General experience of comfort will improve Outcome: Progressing   Ivan Anchors, RN. 10/09/21 6:33 PM

## 2021-10-09 NOTE — Anesthesia Postprocedure Evaluation (Signed)
Anesthesia Post Note  Patient: Melissa Velazquez  Procedure(s) Performed: LAPAROSCOPIC RIGHT HEMI COLECTOMY (Right) CYSTOSCOPY WITH STENT PLACEMENT     Patient location during evaluation: PACU Anesthesia Type: General Level of consciousness: patient cooperative, oriented and sedated Pain management: pain level controlled Vital Signs Assessment: post-procedure vital signs reviewed and stable Respiratory status: spontaneous breathing, nonlabored ventilation, respiratory function stable and patient connected to nasal cannula oxygen Cardiovascular status: blood pressure returned to baseline and stable Postop Assessment: no apparent nausea or vomiting Anesthetic complications: no   No notable events documented.               Midge Minium

## 2021-10-09 NOTE — H&P (Signed)
 CC: Here today for surgery  HPI: Melissa Velazquez is an 71 y.o. female with history of Renal cell carcinoma, Afib, HTN, HLD, GERD, whom is seen in the office today as a referral by Dr. Katragadda for evaluation of cecal polypoid mass.  Colonoscopy 07/25/2021 with Dr. Castaneda demonstrated: 1. 3 cm polyp in the cecum, biopsied -at least intramucosal adenocarcinoma/high-grade dysplasia-comment: "In view of the endoscopic description of the lesion, this most likely represents invasive adenocarcinoma in the cecum"  2. 3, 3 to 5 mm polyps in the descending, ascending colon removed. 3. Diverticulosis in sigmoid, descending, transverse 4. Mixed internal/external hemorrhoids. **I reviewed the imaging and this does appear around the appendiceal orifice such that it is clearly in the cecum proper.  CT CAP 08/13/21 - no evidence of dominant colon mass. No evidence of metastatic disease. Interval ablation of the local recurrence involving the lower pole of left kidney. Proximal and mid small bowel dilation and wall thickening, favor enteritis.  She saw genetics 09/01/2021 and had negative panel.   Colonoscopy 12/14/16 -10 to 12 mm polyp at the appendiceal orifice that was carpet like. Polyp resection was noted to be incomplete. Intervention then required a device that was different and polypectomy technique. The polyp was removed in a piecemeal technique using a hot snare. Polyp resection was incomplete. Coagulation for destruction of the remaining portion of lesion using argon plasma was reported successful  Hx of endometrial cancer 2000 - stage I grade I endometrial cancer, recurrence 2008 on right pelvic sidewall treated with adjuvant XRT and 5 yrs of oral megace. Tumor ER/PR positive.   INTERVAL HX She denies any changes in her health or health history since we met in the office - states she is ready for surgery. Tolerated bowel prep with satisfactory result.    PMH: Afib, HTN, HLD, GERD,  endometrial cancer. -cardiologist is Dr. Branch.  PSH: TAH/BSO (2000, endometrial ca stage I, recurrence, right pelvic side wall xrt 2008). Partial left nephrectomy Dr. Herrick 2018, recurrence now s/p cryo ablation with IR.   Past Medical History:  Diagnosis Date   Allergic rhinitis    Anal fissure    Hx of    Anxiety    hx of   Arthritis    Asthma    none in last year seasonal   Chronic kidney disease    partial nephrectomy   Depression    hx of   Diverticular disease    Dysrhythmia    a_fib   Eczema    Endometrial cancer (HCC) 2001   spread to appendix    Hemorrhoids    Hyperglycemia    Hypertension    Hypothyroidism    Kidney cancer, primary, with metastasis from kidney to other site (HCC)    Obesity    Sebaceous cyst    Thyroid disease    Thyroid nodule    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    Past Surgical History:  Procedure Laterality Date   APPENDECTOMY  01/19/2006   BIOPSY  12/14/2016   Procedure: BIOPSY;  Surgeon: Rehman, Najeeb U, MD;  [facility]: [facility];  Service: Endoscopy;;  colon   BIOPSY  07/25/2021   Procedure: BIOPSY;  Surgeon: Castaneda Mayorga, Daniel, MD;  [facility]: [facility];  Service: Gastroenterology;;   CHOLECYSTECTOMY  01/19/2006   COLONOSCOPY N/A 11/24/2012   Procedure: COLONOSCOPY;  Surgeon: Najeeb U Rehman, MD;  [facility]: [facility];  Service: Endoscopy;  Laterality: N/A;  830   COLONOSCOPY N/A   12/14/2016   Procedure: COLONOSCOPY;  Surgeon: Rogene Houston, MD;  [facility]: [facility];  Service: Endoscopy;  Laterality: N/A;  12:00   COLONOSCOPY  07/25/2021   Bx of polyp CA +   COLONOSCOPY WITH PROPOFOL N/A 07/25/2021   Procedure: COLONOSCOPY WITH PROPOFOL;  Surgeon: Harvel Quale, MD;  [facility]: [facility];  Service: Gastroenterology;  Laterality: N/A;  115 ASA 1   IR RADIOLOGIST EVAL & MGMT  06/23/2021   IR RADIOLOGIST EVAL & MGMT  09/05/2021   POLYPECTOMY  12/14/2016   Procedure: POLYPECTOMY;   Surgeon: Rogene Houston, MD;  [facility]: [facility];  Service: Endoscopy;;  colon   POLYPECTOMY  07/25/2021   Procedure: POLYPECTOMY;  Surgeon: Harvel Quale, MD;  [facility]: [facility];  Service: Gastroenterology;;   RADIOLOGY WITH ANESTHESIA N/A 08/06/2021   Procedure: IR WITH ANESTHESIA CRYOABLATION;  Surgeon: Aletta Edouard, MD;  Location: [facility];  Service: Radiology;  Laterality: N/A;   ROBOTIC ASSITED PARTIAL NEPHRECTOMY Left 08/21/2016   Procedure: XI ROBOTIC ASSITED PARTIAL NEPHRECTOMY;  Surgeon: Ardis Hughs, MD;  Location: [facility];  Service: Urology;  Laterality: Left;   TOTAL ABDOMINAL HYSTERECTOMY  01/20/1999   TOTAL KNEE ARTHROPLASTY Bilateral 01/24/2020   Procedure: TOTAL KNEE BILATERAL;  Surgeon: Gaynelle Arabian, MD;  Location: [facility];  Service: Orthopedics;  Laterality: Bilateral;   WISDOM TOOTH EXTRACTION      Family History  Problem Relation Age of Onset   Breast cancer Mother    Diabetes Mother    Obesity Sister    Asthma Sister    Eczema Sister    COPD Father        smoked   Prostate cancer Father    Breast cancer Maternal Aunt    Urticaria Maternal Grandfather    Colon cancer Neg Hx     Social:  reports that she quit smoking about 23 years ago. Her smoking use included cigarettes. She has a 20.00 pack-year smoking history. She has been exposed to tobacco smoke. She has never used smokeless tobacco. She reports current alcohol use. She reports that she does not use drugs.  Allergies:  Allergies  Allergen Reactions   Triple Antibiotic [Bacitracin-Neomycin-Polymyxin] Anaphylaxis   Levaquin [Levofloxacin] Other (See Comments)    Stomach upset   Oxycodone     Tremors and brain fog   Capsaicin Rash   Elastic Bandages & [Zinc] Rash   Nickel Itching and Rash    Medications: I have reviewed the patient's current medications.  No results found for this or any previous visit (from the past 48 hour(s)).  No results found.  ROS - all of  the below systems have been reviewed with the patient and positives are indicated with bold text General: chills, fever or night sweats Eyes: blurry vision or double vision ENT: epistaxis or sore throat Allergy/Immunology: itchy/watery eyes or nasal congestion Hematologic/Lymphatic: bleeding problems, blood clots or swollen lymph nodes Endocrine: temperature intolerance or unexpected weight changes Breast: new or changing breast lumps or nipple discharge Resp: cough, shortness of breath, or wheezing CV: chest pain or dyspnea on exertion GI: as per HPI GU: dysuria, trouble voiding, or hematuria MSK: joint pain or joint stiffness Neuro: TIA or stroke symptoms Derm: pruritus and skin lesion changes Psych: anxiety and depression  PE Blood pressure (!) 143/76, pulse (!) 59, temperature 97.9 F (36.6 C), temperature source Oral, resp. rate 16, SpO2 100 %. Constitutional: NAD; conversant Eyes: Moist conjunctiva Lungs: Normal respiratory effort CV: RRR  GI: Abd soft, nontender, nondistended MSK: Normal range of motion of extremities Psychiatric: Appropriate affect; alert and oriented x3  No results found for this or any previous visit (from the past 48 hour(s)).  No results found.  A/P: Darlisa M Reardon is an 71 y.o. female with hx of Afib, renal cell carcinoma left, endometrial cancer (TAHBSO 2000, recurrence then XRT 2008 to right pelvic sidewall) HTN, HLD, GERD, lichen planus (oral, treated with magic mouthwash) here for surgery for cecal mass, presumed invasive adenoarcinoma   -Cardiac clearance from Dr. Branch, plans as per cardiology for holding her anticoagulation perioperatively.  -The anatomy and physiology of the GI tract was reviewed with her. The pathophysiology of colon polyps and cancer was discussed as well.  -We have discussed various different treatment options going forward including surgery (the most definitive) to address this -laparoscopic right hemicolectomy versus  open right hemicolectomy, cystoscopy/ureteral stents with [facility] concurrently.  -The planned procedure, material risks (including, but not limited to, pain, bleeding, infection, scarring, need for blood transfusion, damage to surrounding structures- blood vessels/nerves/viscus/organs, damage to ureter, urine leak, leak from anastomosis, need for additional procedures, scenarios where a stoma may be necessary and where it may be permanent, worsening of pre-existing medical conditions, hernia, recurrence, pneumonia, heart attack, stroke, death) benefits and alternatives to surgery were discussed at length. The patient's questions were answered to her satisfaction, she voiced understanding and elected to proceed with surgery. Additionally, we discussed typical postoperative expectations and the recovery process.   Christopher White, MD [facility] 

## 2021-10-09 NOTE — Op Note (Signed)
PATIENT: Melissa Velazquez  71 y.o. female  Patient Care Team: Melissa Sites, MD as PCP - General (Family Medicine) Melissa Velazquez, Melissa Guild, MD as PCP - Cardiology (Cardiology) Melissa Epley, MD as PCP - Electrophysiology (Cardiology) Melissa Jack, MD as Medical Oncologist (Medical Oncology) Melissa Mates, RN as Oncology Nurse Navigator (Medical Oncology)  PREOP DIAGNOSIS: Colon adenocarcinoma - cecum  POSTOP DIAGNOSIS: Same  PROCEDURE:  Laparoscopic right hemicolectomy Laparoscopic lysis of adhesions x 30 minutes  SURGEON: Melissa Mt. Blanton Kardell, MD  ASSISTANT: Melissa Ruff, MD  ANESTHESIA: General endotracheal  EBL: 50 mL Total I/O In: 100 [IV Piggyback:100] Out: 125 [Urine:75; Blood:50]  DRAINS: None  SPECIMEN: Right colon (including terminal ileum, cecum, ascending and proximal transverse colon)  COUNTS: Sponge, needle and instrument counts were reported correct x2  FINDINGS:  Adhesions in abdomen primarily omental to lower abdominal wall. Adhesions of cecum to the right lower quadrant. Adhesions of small bowel involving much of distal ileum. All were lysed.   No obvious extension beyond the wall of the colon.  There were no significant adhesions surprisingly involving her small bowel in her pelvis.  A right hemicolectomy was carried out which included the right branch of the middle colic. No evident metastatic disease on visceral parietal peritoneum or liver.   NARRATIVE:  The patient was identified & brought into the operating room, placed supine on the operating table and SCDs were applied to the lower extremities. General endotracheal anesthesia was induced. The patient was positioned supine with arms tucked. Antibiotics were administered. A foley catheter was placed under sterile conditions. Hair in the region of planned surgery was clipped. The abdomen was prepped and draped in a sterile fashion. A timeout was performed confirming our patient and plan.    Beginning with the extraction port, a supraumbilical incision was made and carried down to the midline fascia. This was then incised with electrocautery. The peritoneum was identified and elevated between clamps and carefully opened sharply. A small Alexis wound protector with a cap and associated port was then placed. The abdomen was insufflated to 15 mmHg with Co2. A laparoscope was placed and camera inspection revealed no evidence of injury. Bilateral TAP blocks were then performed under laparoscopic visualization using a mixture of 0.25% marcaine with epinepherine + Exparel. 3 additional ports were then placed under direct laparoscopic visualization - two in the left hemiabdomen and one in the right abdomen. The abdomen was surveyed. The liver and peritoneum appeared normal.  There were no signs of metastatic disease.  There are adhesions of omentum across her lower midline as well as of her cecum to her right lower quadrant.  These were all lysed.  There also adhesions involving her distal ileum that were adherent to 1 another as well as to the mesocolon.  These were ultimately lysed sharply as well.  She was positioned in trendelenburg with left side down. The ileocolic pedicle was identified. Gentle blunt dissection commenced around the pedicle and the duodenum was identified and freed from the surrounding structures. These are separated bluntly and used the Enseal device to ligate and divide the ileocolic pedicle near its origin.  We developed the retroperitoneal plane bluntly.  We then freed the appendix off its attachments to the pelvic wall. We mobilized the terminal ileum, taking care to avoid injuring any retroperitoneal structures.  After this, we began to mobilize laterally down the Melissa Velazquez line of Toldt and then took down the hepatic flexure using the Enseal device. We mobilized the omentum  off of the right transverse colon. The entire colon was then flipped medially and mobilized off of the  retroperitoneal structures until we could visualize the lateral edge of the duodenum underneath.  We gently freed the duodenal attachments.   At this point, the abdomen was desufflated and the terminal ileum and right colon delivered through the wound protector. The terminal ileum was then transected using a GIA blue load stapler. A Melissa Velazquez was then placed on the proximal side of the ileal staple line to assist in maintaining orientation during the mesenteric division portion of the procedure. The remaining mesentery was divided using the Enseal device. The divided mesentery was inspected and noted to be hemostatic. The distal point of transection was then identified on the transverse colon at a location that included the right branch of the middle colics, leaving the main middle colic feeding the remaining transverse colon. This was transected using another blue load GIA stapler.  The specimen was then passed off. Attention was turned to creating the anastomosis. The distal ileum and transverse colon were inspected for orientation to ensure no twisting nor bowel included in the mesenteric defect. An anastomosis was created between the terminal ileum and the transverse colon using a 75 mm GIA blue load stapler. The staple line was inspected and noted to be hemostatic.  The common enterotomy channel was closed using a TA 90 blue load stapler. Hemostasis was achieved at the staple line using 3-0 silk U-stitches. 3-0 silk sutures were used to imbricate the corners of the staple line as well.  A 2-0 silk suture was placed securing the apex of the anastomosis. The anastomosis was palpated and noted to be widely patent. This was then placed back into the abdomen. The abdomen was then irrigated with sterile saline and hemostasis verified. The omentum was then brought down over the anastomosis. The wound protector cap was replaced and CO2 reinsufflated. The laparoscopic ports were removed under direct visualization and  the Velazquez noted to be hemostatic. The Alexis wound protector was removed, counts were reported correct, and we switched to clean instruments, gowns and drapes.  The fascia was then closed using two running #1 PDS sutures.  The skin of all incision Velazquez was closed with 4-0 monocryl subcuticular suture. Dermabond was placed on the port Velazquez and a sterile dressing was placed over the abdominal incision. All sponge, needle and instrument counts were reported correct. She was then awakened from anesthesia, extubated and transferred to a stretcher for transport to PACU in satisfactory condition.   DISPOSITION: PACU in satisfactory condition

## 2021-10-09 NOTE — Op Note (Signed)
Preoperative diagnosis:  Colon cancer Postoperative diagnosis:  Same   Procedure: Cystoscopy, retrograde pyelogram with interpretation Bilateral temporary ureteral stent placement   Surgeon: Ardis Hughs, MD   Anesthesia: General   Complications: None   Intraoperative findings:   #1)  Left retrograde pyelogram was performed using 10cc of Omnipaque contrast using a 5 [place] open-ended ureteral catheter demonstrating a normal caliber ureter with no significant hydroureteronephrosis or filling defects. #2: Right retrograde pyelogram was performed using  10cc  Omnipaque contrast using a 5 [place] open-ended ureteral catheter demonstrating a normal caliber ureter with no significant filling defect or hydroureteronephrosis.  EBL: Minimal   Specimens: None   Indication:NAME@ is a 71 y.o.  patient with right colon cancer and history of pelvic radiation.  Dr. Dema Severin requested temporary ureteral stents to help facilitate the dissection of the sigmoid colon.  After reviewing the management options for treatment, he elected to proceed with the above surgical procedure(s). We have discussed the potential benefits and risks of the procedure, side effects of the proposed treatment, the likelihood of the patient achieving the goals of the procedure, and any potential problems that might occur during the procedure or recuperation. Informed consent has been obtained.   Description of procedure:   The patient was taken to the operating room and general anesthesia was induced.  The patient was placed in the dorsal lithotomy position, prepped and draped in the usual sterile fashion, and preoperative antibiotics were administered. A preoperative time-out was performed.    A 21 French 30 degree cystoscope was gently passed through the patient's urethra into the bladder.  The bladder was subsequently emptied and then filled slowly up performing a 360 degrees cystoscopic evaluation.  This demonstrated  orthotopic ureteral orifices, normal bladder mucosa with no evidence of colovesical fistula without mucosal abnormality.   I then advanced a 5 [place] open-ended ureteral catheter into the patient's left ureteral orifice and performed retrograde pyelogram with the above findings.  I then advanced the catheter up to the renal pelvis.  Subsequently turned my attention to the patient's right ureteral orifice and using a second open-ended catheter advanced it into the right ureteral orifice performing a retrograde pyelogram with the above findings.  I then advanced the stent up to the renal pelvis under fluoroscopic guidance.  The bladder was subsequently emptied and the stents were kept in place removing the scope over the stents.  I then  placed a 16 [place] Foley with a Freight forwarder.  The stents were then attached to the [facility] adapter and then tied to the Foley with a silk tie.    The surgery was then turned over to Dr. Dema Severin for facilitation of the remainder of the case.

## 2021-10-09 NOTE — Transfer of Care (Signed)
Immediate Anesthesia Transfer of Care Note  Patient: Melissa Velazquez  Procedure(s) Performed: LAPAROSCOPIC RIGHT HEMI COLECTOMY (Right) CYSTOSCOPY WITH STENT PLACEMENT  Patient Location: PACU  Anesthesia Type:General  Level of Consciousness: awake, alert  and patient cooperative  Airway & Oxygen Therapy: Patient Spontanous Breathing and Patient connected to face mask oxygen  Post-op Assessment: Report given to RN and Post -op Vital signs reviewed and stable  Post vital signs: Reviewed and stable  Last Vitals:  Vitals Value Taken Time  BP 120/108 10/09/21 1355  Temp    Pulse 69 10/09/21 1400  Resp 13 10/09/21 1400  SpO2 100 % 10/09/21 1400  Vitals shown include unvalidated device data.  Last Pain:  Vitals:   10/09/21 1034  TempSrc:   PainSc: 0-No pain         Complications: No notable events documented.

## 2021-10-09 NOTE — H&P (Signed)
I have been asked to see the patient by Dr. Annye English, for evaluation and management of right ureteral cancer.  History of present illness: 72F with history of multiple cancers who presents today for right colon cancer, with history of pelvic radiation.  As part of the surgery, Dr. Dema Severin has asked for ureteral stents to help facilitate the surgery.  She has no urinary tract symptoms and no hydronephrosis.  She is familiar to me for history of kidney cancer s/p left partial nephrectomy in 2018.  Review of systems: A 12 point comprehensive review of systems was obtained and is negative unless otherwise stated in the history of present illness.  Patient Active Problem List   Diagnosis Date Noted   Genetic testing 09/01/2021   Renal carcinoma, left ([place]) 08/06/2021   Rectal bleeding 06/12/2021   Atrial fibrillation ([place]) 04/01/2020   Encounter for therapeutic drug monitoring 04/01/2020   Acute blood loss anemia 02/09/2020   Atrial fibrillation with rapid ventricular response ([place]) 01/29/2020   Hypoxia 01/29/2020   Hypothyroidism 01/29/2020   OA (osteoarthritis) of knee 01/24/2020   Bilateral primary osteoarthritis of knee 01/24/2020   Seasonal and perennial allergic rhinitis 12/24/2019   Allergic contact dermatitis 12/24/2019   Mild intermittent asthma, uncomplicated 86/76/7209   IBS (irritable bowel syndrome) 04/11/2019   History of colonic polyps 11/25/2016   Renal mass 08/21/2016   Upper airway cough syndrome 07/06/2014   Essential hypertension 07/06/2014   Personal history of colonic polyps 09/17/2010   BACK PAIN 09/14/2008   HIRSUTISM 06/15/2008   GLUCOSE INTOLERANCE 01/09/2008   NEOPLASM, MALIGNANT, UTERUS 12/27/2006   THYROID NODULE 12/27/2006   DIVERTICULAR DISEASE 06/03/2006   OBESITY NOS 03/09/2006   DEPRESSION 03/09/2006   ALLERGIC RHINITIS 03/09/2006   ASTHMA 03/09/2006   GERD 03/09/2006   ELEVATED BLOOD PRESSURE WITHOUT DIAGNOSIS OF HYPERTENSION 03/09/2006    No  current facility-administered medications on file prior to encounter.   Current Outpatient Medications on File Prior to Encounter  Medication Sig Dispense Refill   cetirizine (ZYRTEC) 10 MG tablet Take 10 mg by mouth daily as needed for allergies.     Cholecalciferol (VITAMIN D3) 125 MCG (5000 UT) CAPS Take 5,000 Units by mouth daily.     fluconazole (DIFLUCAN) 150 MG tablet Take 150 mg by mouth See admin instructions. Take 150 mg every 4 days as needed for lichen planus or thrush     fluticasone (FLONASE) 50 MCG/ACT nasal spray Place 1 spray into both nostrils 2 (two) times daily as needed for allergies or rhinitis. 16 g 5   hydrocortisone (ANUSOL-HC) 2.5 % rectal cream Apply 1 Application topically 4 (four) times daily as needed for hemorrhoids.     levothyroxine (SYNTHROID, LEVOTHROID) 75 MCG tablet Take 75 mcg by mouth daily before breakfast.     magic mouthwash SOLN Take 5 mLs by mouth 2 (two) times daily as needed (Thrush / lichen planus). Swish and spit     metoprolol tartrate (LOPRESSOR) 25 MG tablet Take 0.5 tablets (12.5 mg total) by mouth as needed (for palpitations). 45 tablet 3   Polyethyl Glycol-Propyl Glycol (SYSTANE OP) Place 2-3 drops into both eyes 4 (four) times daily as needed (itching / burning).     sertraline (ZOLOFT) 100 MG tablet Take 150 mg by mouth daily with breakfast.     simethicone (MYLICON) 470 MG chewable tablet Chew 125 mg by mouth every 6 (six) hours as needed for flatulence.     triamcinolone ointment (KENALOG) 0.5 % Apply 1 Application  topically 2 (two) times daily as needed (itching).     warfarin (COUMADIN) 5 MG tablet Take 1 1/2 - 2 tablets daily as directed by coumadin clinic (Patient taking differently: Take 7.5-10 mg by mouth See admin instructions. Take 1 1/2 - 2 tablets daily as directed by coumadin clinic; 10 mg Tues Thurs and Sat, 7.5 mg all other days  Take 7.5 mg on Mon, Tues, Wed, Fri, and Sat Take 10 mg on Thurs and Sun) 180 tablet 1   albuterol  (PROVENTIL HFA;VENTOLIN HFA) 108 (90 Base) MCG/ACT inhaler Inhale 2 puffs into the lungs every 6 (six) hours as needed for wheezing or shortness of breath.     HYDROcodone-acetaminophen (NORCO/VICODIN) 5-325 MG tablet Take 1-2 tablets by mouth every 4 (four) hours as needed for moderate pain. (Patient not taking: Reported on 10/01/2021) 30 tablet 0   nitrofurantoin, macrocrystal-monohydrate, (MACROBID) 100 MG capsule Take 1 capsule (100 mg total) by mouth 2 (two) times daily. (Patient not taking: Reported on 10/01/2021) 10 capsule 0   ondansetron (ZOFRAN) 8 MG tablet Take 8 mg by mouth every 8 (eight) hours as needed.      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    Past Surgical History:  Procedure Laterality Date   APPENDECTOMY  01/19/2006   BIOPSY  12/14/2016   Procedure: BIOPSY;  Surgeon: Rogene Houston, MD;  [facility];  Service: Endoscopy;;  colon   BIOPSY  07/25/2021   Procedure: BIOPSY;  Surgeon: Harvel Quale, MD;  [facility];  Service: Gastroenterology;;   CHOLECYSTECTOMY  01/19/2006   COLONOSCOPY N/A 11/24/2012   Procedure: COLONOSCOPY;  Surgeon: Rogene Houston, MD;  [facility];  Service: Endoscopy;  Laterality: N/A;  830   COLONOSCOPY N/A 12/14/2016   Procedure: COLONOSCOPY;  Surgeon: Rogene Houston, MD;  [facility];  Service: Endoscopy;  Laterality:  N/A;  12:00   COLONOSCOPY  07/25/2021   Bx of polyp CA +   COLONOSCOPY WITH PROPOFOL N/A 07/25/2021   Procedure: COLONOSCOPY WITH PROPOFOL;  Surgeon: Harvel Quale, MD;  [facility];  Service: Gastroenterology;  Laterality: N/A;  115 ASA 1   IR RADIOLOGIST EVAL & MGMT  06/23/2021   IR RADIOLOGIST EVAL & MGMT  09/05/2021   POLYPECTOMY  12/14/2016   Procedure: POLYPECTOMY;  Surgeon: Rogene Houston, MD;  [facility];  Service: Endoscopy;;  colon   POLYPECTOMY  07/25/2021   Procedure: POLYPECTOMY;  Surgeon: Harvel Quale, MD;  [facility];  Service: Gastroenterology;;   RADIOLOGY WITH ANESTHESIA N/A 08/06/2021   Procedure: IR WITH ANESTHESIA CRYOABLATION;  Surgeon: Aletta Edouard,  MD;  [facility];  Service: Radiology;  Laterality: N/A;   ROBOTIC ASSITED PARTIAL NEPHRECTOMY Left 08/21/2016   Procedure: XI ROBOTIC ASSITED PARTIAL NEPHRECTOMY;  Surgeon: Ardis Hughs, MD;  Location: [facility];  Service: Urology;  Laterality: Left;   TOTAL ABDOMINAL HYSTERECTOMY  01/20/1999   TOTAL KNEE ARTHROPLASTY Bilateral 01/24/2020   Procedure: TOTAL KNEE BILATERAL;  Surgeon: Gaynelle Arabian, MD;  Location[facility];  Service: Orthopedics;  Laterality: Bilateral;   WISDOM TOOTH EXTRACTION      Social History   Tobacco Use   Smoking status: Former    Packs/day: 1.00    Years: 20.00    Total pack years: 20.00    Types: Cigarettes    Quit date: 01/19/1998    Years since quitting: 23.7    Passive exposure: Past   Smokeless tobacco: Never  Vaping Use   Vaping Use: Never used  Substance Use Topics   Alcohol use: Yes    Alcohol/week: 0.0 standard drinks of alcohol    Comment: rare   Drug use: No    Family History  Problem Relation Age of Onset   Breast cancer Mother    Diabetes Mother    Obesity Sister    Asthma Sister    Eczema Sister    COPD Father        smoked   Prostate cancer Father    Breast cancer Maternal Aunt     Urticaria Maternal Grandfather    Colon cancer Neg Hx     PE: Vitals:   10/09/21 1024 10/09/21 1035  BP: (!) 143/76   Pulse: (!) 59   Resp: 16   Temp: 97.9 F (36.6 C)   TempSrc: Oral   SpO2: 100%   Weight:  90.3 kg  Height:  '5\' 4"'$  (1.626 m)   Patient appears to be in no acute distress  patient is alert and oriented x3 Atraumatic normocephalic head No cervical or supraclavicular lymphadenopathy appreciated No increased work of breathing, no audible wheezes/rhonchi Regular sinus rhythm/rate Abdomen is soft, nontender, nondistended, no CVA or suprapubic tenderness Lower extremities are symmetric without appreciable edema Grossly neurologically intact No identifiable skin lesions  No results for input(s): "WBC", "HGB", "HCT" in the last 72 hours. No results for input(s): "NA", "K", "CL", "CO2", "GLUCOSE", "BUN", "CREATININE", "CALCIUM" in the last 72 hours. No results for input(s): "LABPT", "INR" in the last 72 hours. No results for input(s): "LABURIN" in the last 72 hours. Results for orders placed or performed during the hospital encounter of 01/23/20  SARS CORONAVIRUS 2 (TAT 6-24 HRS) Nasopharyngeal Nasopharyngeal Swab     Status: None   Collection Time: 01/23/20  2:19 PM   Specimen: Nasopharyngeal Swab  Result Value Ref Range Status   SARS Coronavirus 2 NEGATIVE NEGATIVE Final    Comment: (NOTE) SARS-CoV-2 target nucleic acids are NOT DETECTED.  The SARS-CoV-2 RNA is generally detectable in upper and lower respiratory specimens during the acute phase of infection. Negative results do not preclude SARS-CoV-2 infection, do not rule out co-infections with other pathogens, and should not be used as the sole basis for treatment or other patient management decisions. Negative results must be combined with clinical observations, patient history, and epidemiological information. The expected result is Negative.  Fact Sheet for  Patients: SugarRoll.be  Fact Sheet for Healthcare Providers: https://www.woods-mathews.com/  This test is not yet approved or cleared by the [place] FDA and  has been authorized for detection and/or diagnosis of SARS-CoV-2 by FDA under an  Emergency Use Authorization (EUA). This EUA will remain  in effect (meaning this test can be used) for the duration of the COVID-19 declaration under Se ction 564(b)(1) of the Act, 21 U.S.C. section 360bbb-3(b)(1), unless the authorization is terminated or revoked sooner.  Performed at [facility], [place] [address], [place], [place] [address]     Imaging: I reviewed her most recent CT images from July 2023 and discussed them with her in detail.  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  Recommendations:  Plan to perform cystoscopy, bilateral retrograde pyelogram and temporary indwelling stents in preparation for her colon surgery.  I discussed this with the patient and she has opted to proceed.    Ardis Hughs

## 2021-10-09 NOTE — Discharge Instructions (Signed)
POST OP INSTRUCTIONS AFTER COLON SURGERY  DIET: Be sure to include lots of fluids daily to stay hydrated - 64oz of water per day (8, 8 oz glasses).  Avoid fast food or heavy meals for the first couple of weeks as your are more likely to get nauseated. Avoid raw/uncooked fruits or vegetables for the first 4 weeks (its ok to have these if they are blended into smoothie form). If you have fruits/vegetables, make sure they are cooked until soft enough to mash on the roof of your mouth and chew your food well. Otherwise, diet as tolerated.  Take your usually prescribed home medications unless otherwise directed.  PAIN CONTROL: Pain is best controlled by a usual combination of three different methods TOGETHER: Ice/Heat Over the counter pain medication Prescription pain medication Most patients will experience some swelling and bruising around the surgical site.  Ice packs or heating pads (30-60 minutes up to 6 times a day) will help. Some people prefer to use ice alone, heat alone, alternating between ice & heat.  Experiment to what works for you.  Swelling and bruising can take several weeks to resolve.   It is helpful to take an over-the-counter pain medication regularly for the first few weeks: Ibuprofen (Motrin/Advil) - 200mg tabs - take 3 tabs (600mg) every 6 hours as needed for pain (unless you have been directed previously to avoid NSAIDs/ibuprofen) Acetaminophen (Tylenol) - you may take 650mg every 6 hours as needed. You can take this with motrin as they act differently on the body. If you are taking a narcotic pain medication that has acetaminophen in it, do not take over the counter tylenol at the same time. NOTE: You may take both of these medications together - most patients  find it most helpful when alternating between the two (i.e. Ibuprofen at 6am, tylenol at 9am, ibuprofen at 12pm ...) A  prescription for pain medication should be given to you upon discharge.  Take your pain medication as  prescribed if your pain is not adequatly controlled with the over-the-counter pain reliefs mentioned above.  Avoid getting constipated.  Between the surgery and the pain medications, it is common to experience some constipation.  Increasing fluid intake and taking a fiber supplement (such as Metamucil, Citrucel, FiberCon, MiraLax, etc) 1-2 times a day regularly will usually help prevent this problem from occurring.  A mild laxative (prune juice, Milk of Magnesia, MiraLax, etc) should be taken according to package directions if there are no bowel movements after 48 hours.    Dressing: Your incisions are covered in Dermabond which is like sterile superglue for the skin. This will come off on it's own in a couple weeks. It is waterproof and you may bathe normally starting the day after your surgery in a shower. Avoid baths/pools/lakes/oceans until your wounds have fully healed.  ACTIVITIES as tolerated:   Avoid heavy lifting (>10lbs or 1 gallon of milk) for the next 6 weeks. You may resume regular daily activities as tolerated--such as daily self-care, walking, climbing stairs--gradually increasing activities as tolerated.  If you can walk 30 minutes without difficulty, it is safe to try more intense activity such as jogging, treadmill, bicycling, low-impact aerobics.  DO NOT PUSH THROUGH PAIN.  Let pain be your guide: If it hurts to do something, don't do it. You may drive when you are no longer taking prescription pain medication, you can comfortably wear a seatbelt, and you can safely maneuver your car and apply brakes.  FOLLOW UP in our   office Please call [facility] at [phone] to set up an appointment to see your surgeon in the office for a follow-up appointment approximately 2 weeks after your surgery. Make sure that you call for this appointment the day you arrive home to insure a convenient appointment time.  9. If you have disability or family leave forms that need to be completed, you may have  them completed by your primary care physician's office; for return to work instructions, please ask our office staff and they will be happy to assist you in obtaining this documentation   When to call us [phone]: Poor pain control Reactions / problems with new medications (rash/itching, etc)  Fever over 101.5 F (38.5 C) Inability to urinate Nausea/vomiting Worsening swelling or bruising Continued bleeding from incision. Increased pain, redness, or drainage from the incision  The clinic staff is available to answer your questions during regular business hours (8:30am-5pm).  Please don't hesitate to call and ask to speak to one of our nurses for clinical concerns.   A surgeon from [facility] is always on call at the hospitals   If you have a medical emergency, go to the nearest emergency room or call 911.  [facility] [address], [address], [place], [place]  [address] MAIN: [phone] FAX: [phone] [email or web address]  

## 2021-10-09 NOTE — Anesthesia Procedure Notes (Signed)
Procedure Name: Intubation Date/Time: 10/09/2021 11:29 AM  Performed by: Eben Burow, CRNAPre-anesthesia Checklist: Patient identified, Emergency Drugs available, Suction available, Patient being monitored and Timeout performed Patient Re-evaluated:Patient Re-evaluated prior to induction Oxygen Delivery Method: Circle system utilized Preoxygenation: Pre-oxygenation with 100% oxygen Induction Type: IV induction Ventilation: Mask ventilation without difficulty Laryngoscope Size: Mac and 4 Grade View: Grade I Tube type: Oral Tube size: 7.0 mm Number of attempts: 1 Airway Equipment and Method: Stylet Placement Confirmation: ETT inserted through vocal cords under direct vision, positive ETCO2 and breath sounds checked- equal and bilateral Secured at: 21 cm Tube secured with: Tape Dental Injury: Teeth and Oropharynx as per pre-operative assessment

## 2021-10-09 NOTE — Anesthesia Preprocedure Evaluation (Addendum)
Anesthesia Evaluation  Patient identified by MRN, date of birth, ID band Patient awake    Reviewed: Allergy & Precautions, NPO status , Patient's Chart, lab work & pertinent test results, reviewed documented beta blocker date and time   History of Anesthesia Complications Negative for: history of anesthetic complications  Airway Mallampati: I  TM Distance: >3 FB Neck ROM: Full    Dental  (+) Dental Advisory Given   Pulmonary COPD,  COPD inhaler, former smoker,    breath sounds clear to auscultation       Cardiovascular hypertension, Pt. on medications and Pt. on home beta blockers (-) angina+ dysrhythmias Atrial Fibrillation  Rhythm:Irregular Rate:Normal  '22 ECHO: EF 60 to 65%. The LV has normal function, no regional wall motion abnormalities. There is mild concentric LVH and moderate basal septal  hypertrophy. RVF is mildly reduced. The right  ventricular size is normal. There is moderately elevated pulmonary artery systolic pressure, mild-mod TR   Neuro/Psych Anxiety Depression negative neurological ROS     GI/Hepatic Neg liver ROS, GERD  Controlled,  Endo/Other  Hypothyroidism   Renal/GU Renal cancer     Musculoskeletal   Abdominal (+) + obese,   Peds  Hematology Coumadin: last dose 9/15   Anesthesia Other Findings   Reproductive/Obstetrics                            Anesthesia Physical Anesthesia Plan  ASA: 3  Anesthesia Plan: General   Post-op Pain Management: Ofirmev IV (intra-op)*   Induction: Intravenous  PONV Risk Score and Plan: 3 and Ondansetron and Dexamethasone  Airway Management Planned: Oral ETT  Additional Equipment: None  Intra-op Plan:   Post-operative Plan: Extubation in OR  Informed Consent: I have reviewed the patients History and Physical, chart, labs and discussed the procedure including the risks, benefits and alternatives for the proposed anesthesia  with the patient or authorized representative who has indicated his/her understanding and acceptance.     Dental advisory given  Plan Discussed with: CRNA and Surgeon  Anesthesia Plan Comments:         Anesthesia Quick Evaluation

## 2021-10-10 ENCOUNTER — Encounter (HOSPITAL_COMMUNITY): Payer: Self-pay | Admitting: Surgery

## 2021-10-10 LAB — CBC
HCT: 38.2 % (ref 36.0–46.0)
Hemoglobin: 12.5 g/dL (ref 12.0–15.0)
MCH: 31.6 pg (ref 26.0–34.0)
MCHC: 32.7 g/dL (ref 30.0–36.0)
MCV: 96.7 fL (ref 80.0–100.0)
Platelets: 152 10*3/uL (ref 150–400)
RBC: 3.95 MIL/uL (ref 3.87–5.11)
RDW: 13 % (ref 11.5–15.5)
WBC: 10.7 10*3/uL — ABNORMAL HIGH (ref 4.0–10.5)
nRBC: 0 % (ref 0.0–0.2)

## 2021-10-10 LAB — BASIC METABOLIC PANEL
Anion gap: 6 (ref 5–15)
BUN: 10 mg/dL (ref 8–23)
CO2: 27 mmol/L (ref 22–32)
Calcium: 8.4 mg/dL — ABNORMAL LOW (ref 8.9–10.3)
Chloride: 106 mmol/L (ref 98–111)
Creatinine, Ser: 0.95 mg/dL (ref 0.44–1.00)
GFR, Estimated: >60 mL/min (ref >60)
Glucose, Bld: 107 mg/dL — ABNORMAL HIGH (ref 70–99)
Potassium: 4.1 mmol/L (ref 3.5–5.1)
Sodium: 139 mmol/L (ref 135–145)

## 2021-10-10 NOTE — Progress Notes (Signed)
Subjective No acute events. Feeling well. Tolerating liquids without n/v. Ambulating. +Flatus. No BM yet  Objective: Vital signs in last 24 hours: Temp:  [97.6 F (36.4 C)-98.6 F (37 C)] 98.2 F (36.8 C) (09/22 0517) Pulse Rate:  [55-66] 65 (09/22 0517) Resp:  [9-18] 16 (09/22 0517) BP: (113-143)/(59-108) 113/60 (09/22 0517) SpO2:  [93 %-100 %] 94 % (09/22 0517) Weight:  [90.3 kg-92 kg] 92 kg (09/22 0500) Last BM Date : 10/09/21  Intake/Output from previous day: 09/21 0701 - 09/22 0700 In: 2683.5 [P.O.:300; I.V.:2283.5; IV Piggyback:100] Out: 1175 [Urine:1125; Blood:50] Intake/Output this shift: No intake/output data recorded.  Gen: NAD, comfortable CV: RRR Pulm: Normal work of breathing Abd: Soft, nondistended, not significantly tender. Incisions with some ecchymosis, no drainage.  Ext: SCDs in place  Lab Results: CBC  Recent Labs    10/09/21 1604 10/10/21 0435  WBC 9.1 10.7*  HGB 12.6 12.5  HCT 39.7 38.2  PLT 137* 152   BMET Recent Labs    10/09/21 1604 10/10/21 0435  NA  --  139  K  --  4.1  CL  --  106  CO2  --  27  GLUCOSE  --  107*  BUN  --  10  CREATININE 0.98 0.95  CALCIUM  --  8.4*   PT/INR Recent Labs    10/09/21 1049  LABPROT 14.9  INR 1.2   ABG No results for input(s): "PHART", "HCO3" in the last 72 hours.  Invalid input(s): "PCO2", "PO2"  Studies/Results:  Anti-infectives: Anti-infectives (From admission, onward)    Start     Dose/Rate Route Frequency Ordered Stop   10/09/21 1548  fluconazole (DIFLUCAN) tablet 150 mg  Status:  Discontinued       Note to Pharmacy: Take 150 mg every 4 days as needed for lichen planus or thrush     150 mg Oral See admin instructions 10/09/21 1548 10/09/21 1559   10/09/21 1030  cefoTEtan (CEFOTAN) 2 g in sodium chloride 0.9 % 100 mL IVPB        2 g 200 mL/hr over 30 Minutes Intravenous  Once 10/09/21 1026 10/09/21 1807   10/09/21 1021  sodium chloride 0.9 % with cefoTEtan (CEFOTAN) ADS Med        Note to Pharmacy: Kyra Leyland E: cabinet override      10/09/21 1021 10/09/21 1142        Assessment/Plan: Patient Active Problem List   Diagnosis Date Noted   S/P right hemicolectomy 10/09/2021   Genetic testing 09/01/2021   Renal carcinoma, left ([place]) 08/06/2021   Rectal bleeding 06/12/2021   Atrial fibrillation ([place]) 04/01/2020   Encounter for therapeutic drug monitoring 04/01/2020   Acute blood loss anemia 02/09/2020   Atrial fibrillation with rapid ventricular response ([place]) 01/29/2020   Hypoxia 01/29/2020   Hypothyroidism 01/29/2020   OA (osteoarthritis) of knee 01/24/2020   Bilateral primary osteoarthritis of knee 01/24/2020   Seasonal and perennial allergic rhinitis 12/24/2019   Allergic contact dermatitis 12/24/2019   Mild intermittent asthma, uncomplicated 11/04/5100   IBS (irritable bowel syndrome) 04/11/2019   History of colonic polyps 11/25/2016   Renal mass 08/21/2016   Upper airway cough syndrome 07/06/2014   Essential hypertension 07/06/2014   Personal history of colonic polyps 09/17/2010   BACK PAIN 09/14/2008   HIRSUTISM 06/15/2008   GLUCOSE INTOLERANCE 01/09/2008   NEOPLASM, MALIGNANT, UTERUS 12/27/2006   THYROID NODULE 12/27/2006   DIVERTICULAR DISEASE 06/03/2006   OBESITY NOS 03/09/2006   DEPRESSION 03/09/2006   ALLERGIC  RHINITIS 03/09/2006   ASTHMA 03/09/2006   GERD 03/09/2006   ELEVATED BLOOD PRESSURE WITHOUT DIAGNOSIS OF HYPERTENSION 03/09/2006   s/p Procedure(s): LAPAROSCOPIC RIGHT HEMI COLECTOMY CYSTOSCOPY WITH STENT PLACEMENT 10/09/2021  -Doing well; we spent time reviewing her procedure, findings and plans moving forward. All questions were answered and expectations.  -Ambulate 5x/day -Fulls, advancing today to soft as tolerated -Foley out later today if urine clear -Ppx: SCDs, SQH   LOS: 1 day   Check [email or web address] for General Surgery coverage night/weekend/holidays  No secure chat available for me given surgeries/clinic/off  post call which would lead to a delay in care.    Nadeen Landau, [facility], [place]

## 2021-10-10 NOTE — Plan of Care (Signed)
  Problem: Bowel/Gastric: Goal: Gastrointestinal status for postoperative course will improve Outcome: Progressing   

## 2021-10-11 LAB — BASIC METABOLIC PANEL
Anion gap: 5 (ref 5–15)
BUN: 13 mg/dL (ref 8–23)
CO2: 30 mmol/L (ref 22–32)
Calcium: 8.6 mg/dL — ABNORMAL LOW (ref 8.9–10.3)
Chloride: 108 mmol/L (ref 98–111)
Creatinine, Ser: 0.98 mg/dL (ref 0.44–1.00)
GFR, Estimated: >60 mL/min (ref >60)
Glucose, Bld: 92 mg/dL (ref 70–99)
Potassium: 4.2 mmol/L (ref 3.5–5.1)
Sodium: 143 mmol/L (ref 135–145)

## 2021-10-11 LAB — CBC
HCT: 35.8 % — ABNORMAL LOW (ref 36.0–46.0)
Hemoglobin: 11.5 g/dL — ABNORMAL LOW (ref 12.0–15.0)
MCH: 31.4 pg (ref 26.0–34.0)
MCHC: 32.1 g/dL (ref 30.0–36.0)
MCV: 97.8 fL (ref 80.0–100.0)
Platelets: 136 10*3/uL — ABNORMAL LOW (ref 150–400)
RBC: 3.66 MIL/uL — ABNORMAL LOW (ref 3.87–5.11)
RDW: 13.3 % (ref 11.5–15.5)
WBC: 6.5 10*3/uL (ref 4.0–10.5)
nRBC: 0 % (ref 0.0–0.2)

## 2021-10-11 NOTE — Progress Notes (Signed)
2 Days Post-Op   Subjective/Chief Complaint: Stool mixed with some blood overnight, having flatus, tol diet, sore   Objective: Vital signs in last 24 hours: Temp:  [98 F (36.7 C)-98.5 F (36.9 C)] 98.5 F (36.9 C) (09/23 0853) Pulse Rate:  [63-73] 73 (09/23 0853) Resp:  [16-18] 18 (09/23 0853) BP: (102-116)/(60-71) 102/62 (09/23 0853) SpO2:  [90 %-96 %] 96 % (09/23 0853) Weight:  [88.5 kg] 88.5 kg (09/23 0500) Last BM Date : 10/10/21  Intake/Output from previous day: 09/22 0701 - 09/23 0700 In: 827.6 [P.O.:720; I.V.:107.6] Out: 1001 [Urine:1000; Stool:1] Intake/Output this shift: No intake/output data recorded.  General nad in chair Pulm nl effort Cv regular Ab approp tender soft some ecchymosis at midline incision  Lab Results:  Recent Labs    10/10/21 0435 10/11/21 0456  WBC 10.7* 6.5  HGB 12.5 11.5*  HCT 38.2 35.8*  PLT 152 136*   BMET Recent Labs    10/10/21 0435 10/11/21 0456  NA 139 143  K 4.1 4.2  CL 106 108  CO2 27 30  GLUCOSE 107* 92  BUN 10 13  CREATININE 0.95 0.98  CALCIUM 8.4* 8.6*   PT/INR Recent Labs    10/09/21 1049  LABPROT 14.9  INR 1.2   ABG No results for input(s): "PHART", "HCO3" in the last 72 hours.  Invalid input(s): "PCO2", "PO2"  Studies/Results: DG C-Arm 1-60 Min-No Report  Result Date: 10/09/2021 Fluoroscopy was utilized by the requesting physician.  No radiographic interpretation.    Anti-infectives: Anti-infectives (From admission, onward)    Start     Dose/Rate Route Frequency Ordered Stop   10/09/21 1548  fluconazole (DIFLUCAN) tablet 150 mg  Status:  Discontinued       Note to Pharmacy: Take 150 mg every 4 days as needed for lichen planus or thrush     150 mg Oral See admin instructions 10/09/21 1548 10/09/21 1559   10/09/21 1030  cefoTEtan (CEFOTAN) 2 g in sodium chloride 0.9 % 100 mL IVPB        2 g 200 mL/hr over 30 Minutes Intravenous  Once 10/09/21 1026 10/09/21 1807   10/09/21 1021  sodium  chloride 0.9 % with cefoTEtan (CEFOTAN) ADS Med       Note to Pharmacy: Kyra Leyland E: cabinet override      10/09/21 1021 10/09/21 1142       Assessment/Plan: POD 2 lap right colon -soft diet -pulm toilet -some blood hct basically same dont think she is bleeding but will keep until tomorrow to make sure resolves, I think fine to keep sq heparin for now    Rolm Bookbinder 10/11/2021

## 2021-10-12 LAB — CBC
HCT: 32.6 % — ABNORMAL LOW (ref 36.0–46.0)
HCT: 31.7 % — ABNORMAL LOW (ref 36.0–46.0)
Hemoglobin: 10.5 g/dL — ABNORMAL LOW (ref 12.0–15.0)
Hemoglobin: 10.3 g/dL — ABNORMAL LOW (ref 12.0–15.0)
MCH: 31.8 pg (ref 26.0–34.0)
MCH: 31.8 pg (ref 26.0–34.0)
MCHC: 32.2 g/dL (ref 30.0–36.0)
MCHC: 32.5 g/dL (ref 30.0–36.0)
MCV: 98.8 fL (ref 80.0–100.0)
MCV: 97.8 fL (ref 80.0–100.0)
Platelets: 141 10*3/uL — ABNORMAL LOW (ref 150–400)
Platelets: 165 10*3/uL (ref 150–400)
RBC: 3.3 MIL/uL — ABNORMAL LOW (ref 3.87–5.11)
RBC: 3.24 MIL/uL — ABNORMAL LOW (ref 3.87–5.11)
RDW: 13.3 % (ref 11.5–15.5)
RDW: 13.4 % (ref 11.5–15.5)
WBC: 5.6 10*3/uL (ref 4.0–10.5)
WBC: 6.1 10*3/uL (ref 4.0–10.5)
nRBC: 0 % (ref 0.0–0.2)
nRBC: 0 % (ref 0.0–0.2)

## 2021-10-12 LAB — BASIC METABOLIC PANEL
Anion gap: 6 (ref 5–15)
BUN: 18 mg/dL (ref 8–23)
CO2: 29 mmol/L (ref 22–32)
Calcium: 8.6 mg/dL — ABNORMAL LOW (ref 8.9–10.3)
Chloride: 107 mmol/L (ref 98–111)
Creatinine, Ser: 0.85 mg/dL (ref 0.44–1.00)
GFR, Estimated: >60 mL/min (ref >60)
Glucose, Bld: 97 mg/dL (ref 70–99)
Potassium: 4.1 mmol/L (ref 3.5–5.1)
Sodium: 142 mmol/L (ref 135–145)

## 2021-10-12 NOTE — Progress Notes (Signed)
3 Days Post-Op   Subjective/Chief Complaint: Some more blood with stool but less, having flatus, tol diet, wants to go home   Objective: Vital signs in last 24 hours: Temp:  [98.2 F (36.8 C)-98.5 F (36.9 C)] 98.3 F (36.8 C) (09/24 0446) Pulse Rate:  [62-73] 62 (09/24 0446) Resp:  [18] 18 (09/24 0446) BP: (102-119)/(62-79) 118/79 (09/24 0446) SpO2:  [95 %-97 %] 95 % (09/24 0446) Weight:  [94.5 kg] 94.5 kg (09/24 0500) Last BM Date : 10/12/21  Intake/Output from previous day: 09/23 0701 - 09/24 0700 In: 1020 [P.O.:1020] Out: -  Intake/Output this shift: No intake/output data recorded.  General nad  Pulm nl effort Cv regular Ab approp tender soft some ecchymosis at midline incision  Lab Results:  Recent Labs    10/11/21 0456 10/12/21 0439  WBC 6.5 5.6  HGB 11.5* 10.5*  HCT 35.8* 32.6*  PLT 136* 141*   BMET Recent Labs    10/11/21 0456 10/12/21 0439  NA 143 142  K 4.2 4.1  CL 108 107  CO2 30 29  GLUCOSE 92 97  BUN 13 18  CREATININE 0.98 0.85  CALCIUM 8.6* 8.6*   PT/INR Recent Labs    10/09/21 1049  LABPROT 14.9  INR 1.2   ABG No results for input(s): "PHART", "HCO3" in the last 72 hours.  Invalid input(s): "PCO2", "PO2"  Studies/Results: No results found.  Anti-infectives: Anti-infectives (From admission, onward)    Start     Dose/Rate Route Frequency Ordered Stop   10/09/21 1548  fluconazole (DIFLUCAN) tablet 150 mg  Status:  Discontinued       Note to Pharmacy: Take 150 mg every 4 days as needed for lichen planus or thrush     150 mg Oral See admin instructions 10/09/21 1548 10/09/21 1559   10/09/21 1030  cefoTEtan (CEFOTAN) 2 g in sodium chloride 0.9 % 100 mL IVPB        2 g 200 mL/hr over 30 Minutes Intravenous  Once 10/09/21 1026 10/09/21 1807   10/09/21 1021  sodium chloride 0.9 % with cefoTEtan (CEFOTAN) ADS Med       Note to Pharmacy: Kyra Leyland E: cabinet override      10/09/21 1021 10/09/21 1142        Assessment/Plan: POD 3 lap right colon -soft diet -pulm toilet -hct 38.2--35.8--32.6.  I think this is mostly equilibration from bleed but is not bleeding. She really wants to go home. I will stop sq heparin and recheck her cbc this afternoon. If it remains stable and she does not have more bleeding will have her go home. If not will remain until tomorrow    Rolm Bookbinder 10/12/2021

## 2021-10-13 NOTE — Progress Notes (Signed)
Discharge instructions given to patient and all questions were answered.  

## 2021-10-13 NOTE — Progress Notes (Signed)
4 Days Post-Op   Subjective/Chief Complaint: No further significant bleeding; cleared up. No n/v. Pain well controlled on oral analgesics. Ambulating well on her own.   Objective: Vital signs in last 24 hours: Temp:  [97.5 F (36.4 C)-98.1 F (36.7 C)] 97.5 F (36.4 C) (09/25 0438) Pulse Rate:  [63-69] 63 (09/25 0438) Resp:  [18] 18 (09/25 0438) BP: (115-126)/(65-80) 126/80 (09/25 0438) SpO2:  [94 %-95 %] 94 % (09/25 0438) Last BM Date : 10/12/21  Intake/Output from previous day: 09/24 0701 - 09/25 0700 In: 300 [P.O.:300] Out: 0  Intake/Output this shift: No intake/output data recorded.  General nad  Pulm nl effort Cv regular Ab approp tender soft some ecchymosis at midline incision  Lab Results:  Recent Labs    10/12/21 0439 10/12/21 1331  WBC 5.6 6.1  HGB 10.5* 10.3*  HCT 32.6* 31.7*  PLT 141* 165   BMET Recent Labs    10/11/21 0456 10/12/21 0439  NA 143 142  K 4.2 4.1  CL 108 107  CO2 30 29  GLUCOSE 92 97  BUN 13 18  CREATININE 0.98 0.85  CALCIUM 8.6* 8.6*   PT/INR No results for input(s): "LABPROT", "INR" in the last 72 hours.  ABG No results for input(s): "PHART", "HCO3" in the last 72 hours.  Invalid input(s): "PCO2", "PO2"  Studies/Results: No results found.  Anti-infectives: Anti-infectives (From admission, onward)    Start     Dose/Rate Route Frequency Ordered Stop   10/09/21 1548  fluconazole (DIFLUCAN) tablet 150 mg  Status:  Discontinued       Note to Pharmacy: Take 150 mg every 4 days as needed for lichen planus or thrush     150 mg Oral See admin instructions 10/09/21 1548 10/09/21 1559   10/09/21 1030  cefoTEtan (CEFOTAN) 2 g in sodium chloride 0.9 % 100 mL IVPB        2 g 200 mL/hr over 30 Minutes Intravenous  Once 10/09/21 1026 10/09/21 1807   10/09/21 1021  sodium chloride 0.9 % with cefoTEtan (CEFOTAN) ADS Med       Note to Pharmacy: Kyra Leyland E: cabinet override      10/09/21 1021 10/09/21 1142        Assessment/Plan: POD 4 lap right colon -soft diet -pulm toilet -She remains highly motivated to go home; tolerating diet, having reliable bowel function, ambulating well on her own. No further bleeding -We discussed options of staying here and restarting warfarin tomorrow vs resuming this tomorrow at home. We discussed risk of bleeding and possibility of returning to hospital if this should occur. She remains motivated to go home, understands the risk of possible bleeding and is requesting to go home. We will plan for discharge home today with precautions noted. -Pathology pending -Follow up in my office arranged -We spent time reviewing general expectations and things to watch out for    Ileana Roup 10/13/2021

## 2021-10-15 ENCOUNTER — Ambulatory Visit: Payer: Medicare Other | Admitting: Hematology

## 2021-10-16 NOTE — Discharge Summary (Signed)
Patient ID: Melissa Velazquez MRN: [id number] DOB/AGE: [birth year] 70 y.o.  Admit date: 10/09/2021 Discharge date: 10/13/2021  Discharge Diagnoses Patient Active Problem List   Diagnosis Date Noted   S/P right hemicolectomy 10/09/2021   Genetic testing 09/01/2021   Renal carcinoma, left ([place]) 08/06/2021   Rectal bleeding 06/12/2021   Atrial fibrillation ([place]) 04/01/2020   Encounter for therapeutic drug monitoring 04/01/2020   Acute blood loss anemia 02/09/2020   Atrial fibrillation with rapid ventricular response ([place]) 01/29/2020   Hypoxia 01/29/2020   Hypothyroidism 01/29/2020   OA (osteoarthritis) of knee 01/24/2020   Bilateral primary osteoarthritis of knee 01/24/2020   Seasonal and perennial allergic rhinitis 12/24/2019   Allergic contact dermatitis 12/24/2019   Mild intermittent asthma, uncomplicated 68/03/2120   IBS (irritable bowel syndrome) 04/11/2019   History of colonic polyps 11/25/2016   Renal mass 08/21/2016   Upper airway cough syndrome 07/06/2014   Essential hypertension 07/06/2014   Personal history of colonic polyps 09/17/2010   BACK PAIN 09/14/2008   HIRSUTISM 06/15/2008   GLUCOSE INTOLERANCE 01/09/2008   NEOPLASM, MALIGNANT, UTERUS 12/27/2006   THYROID NODULE 12/27/2006   DIVERTICULAR DISEASE 06/03/2006   OBESITY NOS 03/09/2006   DEPRESSION 03/09/2006   ALLERGIC RHINITIS 03/09/2006   ASTHMA 03/09/2006   GERD 03/09/2006   ELEVATED BLOOD PRESSURE WITHOUT DIAGNOSIS OF HYPERTENSION 03/09/2006    Consultants none  Procedures Laparoscopic right hemicolectomy Laparoscopic lysis of adhesions x 30 minutes   Hospital Course: She was admitted postoperatively where she recovered well. She did have some presumed oozing from anastomosis manifest with some blood in stool. This was self limited and improved. Her diet gradually advanced. She was noted on 10/13/21 to be tolerating diet, mobilizing well, pain well controlled and having spontaneous bowel function without  ongoing bleeding. She was comfortable with and stable for discharge home.  Final pathology: A. COLON, RIGHT, RESECTION:  -  Consistent with a well differentiated invasive adenocarcinoma into  submucosa.  -  Margins negative.  -  Incidental tubular adenoma.  -  25 lymph nodes, negative for malignancy (0/25)  This was reviewed with her over the phone 10/15/21. Follow-up in my office arranged.    Allergies as of 10/13/2021       Reactions   Triple Antibiotic [bacitracin-neomycin-polymyxin] Anaphylaxis   Levaquin [levofloxacin] Other (See Comments)   Stomach upset   Oxycodone    Tremors and brain fog   Capsaicin Rash   Elastic Bandages & [zinc] Rash   Nickel Itching, Rash        Medication List     STOP taking these medications    HYDROcodone-acetaminophen 5-325 MG tablet Commonly known as: NORCO/VICODIN   nitrofurantoin (macrocrystal-monohydrate) 100 MG capsule Commonly known as: MACROBID       TAKE these medications    albuterol 108 (90 Base) MCG/ACT inhaler Commonly known as: VENTOLIN HFA Inhale 2 puffs into the lungs every 6 (six) hours as needed for wheezing or shortness of breath.   cetirizine 10 MG tablet Commonly known as: ZYRTEC Take 10 mg by mouth daily as needed for allergies.   fluconazole 150 MG tablet Commonly known as: DIFLUCAN Take 150 mg by mouth See admin instructions. Take 150 mg every 4 days as needed for lichen planus or thrush   fluticasone 50 MCG/ACT nasal spray Commonly known as: FLONASE Place 1 spray into both nostrils 2 (two) times daily as needed for allergies or rhinitis.   hydrocortisone 2.5 % rectal cream Commonly known as: ANUSOL-HC Apply 1 Application topically  4 (four) times daily as needed for hemorrhoids.   levothyroxine 75 MCG tablet Commonly known as: SYNTHROID Take 75 mcg by mouth daily before breakfast.   magic mouthwash Soln Take 5 mLs by mouth 2 (two) times daily as needed (Thrush / lichen planus). Swish and  spit   metoprolol tartrate 25 MG tablet Commonly known as: LOPRESSOR Take 0.5 tablets (12.5 mg total) by mouth as needed (for palpitations).   ondansetron 8 MG tablet Commonly known as: ZOFRAN Take 8 mg by mouth every 8 (eight) hours as needed.   sertraline 100 MG tablet Commonly known as: ZOLOFT Take 150 mg by mouth daily with breakfast.   simethicone 125 MG chewable tablet Commonly known as: MYLICON Chew 177 mg by mouth every 6 (six) hours as needed for flatulence.   SYSTANE OP Place 2-3 drops into both eyes 4 (four) times daily as needed (itching / burning).   triamcinolone ointment 0.5 % Commonly known as: KENALOG Apply 1 Application topically 2 (two) times daily as needed (itching).   Vitamin D3 125 MCG (5000 UT) Caps Take 5,000 Units by mouth daily.   warfarin 5 MG tablet Commonly known as: COUMADIN Take as directed. If you are unsure how to take this medication, talk to your nurse or doctor. Original instructions: Take 1 1/2 - 2 tablets daily as directed by coumadin clinic What changed:  how much to take how to take this when to take this additional instructions       ASK your doctor about these medications    traMADol 50 MG tablet Commonly known as: Ultram Take 1 tablet (50 mg total) by mouth every 6 (six) hours as needed for up to 5 days (postop pain not controlled with tylenol/ibuprofen first). Ask about: Should I take this medication?          Follow-up Information     Ileana Roup, MD Follow up.   Specialties: General Surgery, Colon and Rectal Surgery Contact information: [place] [address] [place] [id number] [email or web address] for General Surgery coverage night/weekend/holidays  Page if acute issues. No secure chat available for me given surgeries/clinic/off post call which would lead to a delay in care.  Nadeen Landau, MD [facility], [place]  [facility]

## 2021-10-21 ENCOUNTER — Emergency Department (HOSPITAL_COMMUNITY): Payer: Medicare Other

## 2021-10-21 ENCOUNTER — Encounter (HOSPITAL_COMMUNITY): Payer: Self-pay | Admitting: Emergency Medicine

## 2021-10-21 ENCOUNTER — Other Ambulatory Visit: Payer: Self-pay

## 2021-10-21 ENCOUNTER — Emergency Department (HOSPITAL_COMMUNITY)
Admission: EM | Admit: 2021-10-21 | Discharge: 2021-10-21 | Disposition: A | Discharge status: Home / Self Care | Payer: Medicare Other | Attending: Emergency Medicine | Admitting: Emergency Medicine

## 2021-10-21 DIAGNOSIS — E039 Hypothyroidism, unspecified: Secondary | ICD-10-CM | POA: Insufficient documentation

## 2021-10-21 DIAGNOSIS — S82002A Unspecified fracture of left patella, initial encounter for closed fracture: Secondary | ICD-10-CM

## 2021-10-21 DIAGNOSIS — S82092A Other fracture of left patella, initial encounter for closed fracture: Secondary | ICD-10-CM | POA: Diagnosis not present

## 2021-10-21 DIAGNOSIS — M25561 Pain in right knee: Secondary | ICD-10-CM | POA: Diagnosis not present

## 2021-10-21 DIAGNOSIS — Z85528 Personal history of other malignant neoplasm of kidney: Secondary | ICD-10-CM | POA: Insufficient documentation

## 2021-10-21 DIAGNOSIS — Z85038 Personal history of other malignant neoplasm of large intestine: Secondary | ICD-10-CM | POA: Insufficient documentation

## 2021-10-21 DIAGNOSIS — W19XXXA Unspecified fall, initial encounter: Secondary | ICD-10-CM | POA: Diagnosis not present

## 2021-10-21 DIAGNOSIS — I4891 Unspecified atrial fibrillation: Secondary | ICD-10-CM | POA: Diagnosis not present

## 2021-10-21 DIAGNOSIS — Z7901 Long term (current) use of anticoagulants: Secondary | ICD-10-CM | POA: Diagnosis not present

## 2021-10-21 DIAGNOSIS — N189 Chronic kidney disease, unspecified: Secondary | ICD-10-CM | POA: Diagnosis not present

## 2021-10-21 DIAGNOSIS — Z79899 Other long term (current) drug therapy: Secondary | ICD-10-CM | POA: Diagnosis not present

## 2021-10-21 DIAGNOSIS — I129 Hypertensive chronic kidney disease with stage 1 through stage 4 chronic kidney disease, or unspecified chronic kidney disease: Secondary | ICD-10-CM | POA: Insufficient documentation

## 2021-10-21 DIAGNOSIS — S8992XA Unspecified injury of left lower leg, initial encounter: Secondary | ICD-10-CM | POA: Diagnosis present

## 2021-10-21 NOTE — ED Notes (Signed)
Pt educated on signs of infection and encouraged to monitor abd incision, pt verbalizes understanding, pt verbalizes understanding

## 2021-10-21 NOTE — ED Provider Notes (Signed)
[facility] EMERGENCY DEPARTMENT Provider Note   CSN: [id number] Arrival date & time: 10/21/21  5188     History  Chief Complaint  Patient presents with   Post-op Problem    Melissa Velazquez is a 71 y.o. female.  Pt is a 71 yo female with a pmhx significant for colon cancer, afib on coumadin, htn, arthritis, hld, hypothyroidism, depression, kidney cancer, and ckd.  Pt had a right hemicolectomy by Dr. Dema Severin at [facility] on 9/21.  She was d/c on the 25th.  She fell yesterday and hurt her knees (left more than right).  She said she also started having a small amt of drainage from her incision.  Pt is able to walk, but it hurts to walk.  She denies abd pain.  She is having bowel movements and is not nauseous.       Home Medications Prior to Admission medications   Medication Sig Start Date End Date Taking? Authorizing Provider  cetirizine (ZYRTEC) 10 MG tablet Take 10 mg by mouth daily as needed for allergies.   Yes [provider]  Cholecalciferol (VITAMIN D3) 125 MCG (5000 UT) CAPS Take 5,000 Units by mouth daily.   Yes [provider]  fluconazole (DIFLUCAN) 150 MG tablet Take 150 mg by mouth See admin instructions. Take 150 mg every 4 days as needed for lichen planus or thrush   Yes [provider]  fluticasone (FLONASE) 50 MCG/ACT nasal spray Place 1 spray into both nostrils 2 (two) times daily as needed for allergies or rhinitis. 12/22/19  Yes Valentina Shaggy, MD  hydrocortisone (ANUSOL-HC) 2.5 % rectal cream Apply 1 Application topically 4 (four) times daily as needed for hemorrhoids. 05/27/21  Yes [provider]  levothyroxine (SYNTHROID, LEVOTHROID) 75 MCG tablet Take 75 mcg by mouth daily before breakfast.   Yes [provider]  magic mouthwash SOLN Take 5 mLs by mouth 2 (two) times daily as needed (Thrush / lichen planus). Swish and spit   Yes [provider]  metoprolol tartrate (LOPRESSOR) 25 MG tablet Take 0.5 tablets (12.5  mg total) by mouth as needed (for palpitations). 02/20/20  Yes Weaver, Scott T, PA-C  ondansetron (ZOFRAN) 8 MG tablet Take 8 mg by mouth every 8 (eight) hours as needed. 04/19/21  Yes [provider]  Polyethyl Glycol-Propyl Glycol (SYSTANE OP) Place 2-3 drops into both eyes 4 (four) times daily as needed (itching / burning).   Yes [provider]  sertraline (ZOLOFT) 100 MG tablet Take 150 mg by mouth daily with breakfast.   Yes [provider]  simethicone (MYLICON) 416 MG chewable tablet Chew 125 mg by mouth every 6 (six) hours as needed for flatulence.   Yes [provider]  triamcinolone ointment (KENALOG) 0.5 % Apply 1 Application topically 2 (two) times daily as needed (itching).   Yes [provider]  warfarin (COUMADIN) 5 MG tablet Take 1 1/2 - 2 tablets daily as directed by coumadin clinic Patient taking differently: Take 7.5-10 mg by mouth See admin instructions. Take 7.5 mg on Mon, Tues, Wed, Fri, and Sat Take 10 mg on Thurs and Sun 06/26/21  Yes Branch, Alphonse Guild, MD  albuterol (PROVENTIL HFA;VENTOLIN HFA) 108 (90 Base) MCG/ACT inhaler Inhale 2 puffs into the lungs every 6 (six) hours as needed for wheezing or shortness of breath. Patient not taking: Reported on 10/21/2021    [provider]      Allergies    Triple antibiotic [bacitracin-neomycin-polymyxin], Levaquin [levofloxacin], Oxycodone,  Capsaicin, Elastic bandages & [zinc], and Nickel    Review of Systems   Review of Systems  Gastrointestinal:        Drainage from midline abd incision  Musculoskeletal:        Bilateral knee pain  All other systems reviewed and are negative.   Physical Exam Updated Vital Signs BP 130/70   Pulse 61   Temp 98.1 F (36.7 C) (Oral)   Resp 16   Ht '5\' 4"'$  (1.626 m)   Wt 94.5 kg   SpO2 97%   BMI 35.76 kg/m  Physical Exam Vitals and nursing note reviewed.  Constitutional:      Appearance: Normal appearance.  HENT:     Head:  Normocephalic and atraumatic.     Right Ear: External ear normal.     Left Ear: External ear normal.     Nose: Nose normal.     Mouth/Throat:     Mouth: Mucous membranes are moist.     Pharynx: Oropharynx is clear.  Eyes:     Extraocular Movements: Extraocular movements intact.     Conjunctiva/sclera: Conjunctivae normal.     Pupils: Pupils are equal, round, and reactive to light.  Cardiovascular:     Rate and Rhythm: Normal rate and regular rhythm.     Pulses: Normal pulses.     Heart sounds: Normal heart sounds.  Pulmonary:     Effort: Pulmonary effort is normal.     Breath sounds: Normal breath sounds.  Abdominal:     General: Abdomen is flat. Bowel sounds are normal.     Palpations: Abdomen is soft.     Comments: Midline incision looks good.  Small amt of serosanguinous drainage.  Musculoskeletal:     Cervical back: Normal range of motion and neck supple.     Comments: Right knee with good rom.  Mild tenderness. Left knee with decreased rom.  Bruising to upper shin.    Skin:    General: Skin is warm.     Capillary Refill: Capillary refill takes less than 2 seconds.  Neurological:     General: No focal deficit present.     Mental Status: She is alert and oriented to person, place, and time.  Psychiatric:        Mood and Affect: Mood normal.        Behavior: Behavior normal.     ED Results / Procedures / Treatments   Labs (all labs ordered are listed, but only abnormal results are displayed) Labs Reviewed - No data to display  EKG None  Radiology DG Knee Complete 4 Views Left  Result Date: 10/21/2021 CLINICAL DATA:  Fall yesterday with bilateral knee pain EXAM: LEFT KNEE - COMPLETE 4+ VIEW COMPARISON:  None Available. FINDINGS: Total knee arthroplasty which is well seated. Patella [place] with small joint effusion. Ossified patellar fragments are seen along the infrapatellar space where the patellar tendon is not clearly identified. Negative for tibia or fibular  fracture. IMPRESSION: Fractured patella with patella [place]. Fragments appear corticated on the lateral view but in the setting of acute trauma consider CT characterization. Electronically Signed   By: Jorje Guild M.D.   On: 10/21/2021 09:15   DG Knee Complete 4 Views Right  Result Date: 10/21/2021 CLINICAL DATA:  Fall.  Knee pain. EXAM: RIGHT KNEE - COMPLETE 4+ VIEW COMPARISON:  None Available. FINDINGS: Four view exam shows tricompartmental knee replacement. No evidence for an acute fracture. No substantial joint effusion. No evidence for hardware complication. IMPRESSION:  Tricompartmental knee replacement without evidence for an acute fracture or dislocation. Electronically Signed   By: Misty Stanley M.D.   On: 10/21/2021 09:12    Procedures Procedures    Medications Ordered in ED Medications - No data to display  ED Course/ Medical Decision Making/ A&P                           Medical Decision Making Amount and/or Complexity of Data Reviewed Radiology: ordered.   This patient presents to the ED for concern of knee pain , this involves an extensive number of treatment options, and is a complaint that carries with it a high risk of complications and morbidity.  The differential diagnosis includes fx, sprain   Co morbidities that complicate the patient evaluation  colon cancer, afib on coumadin, htn, arthritis, hld, hypothyroidism, depression, kidney cancer, and ckd   Additional history obtained:  Additional history obtained from epic chart review   Imaging Studies ordered:  I ordered imaging studies including left and right knees  I independently visualized and interpreted imaging which showed  Left knee: IMPRESSION:  Fractured patella with patella [place]. Fragments appear corticated on  the lateral view but in the setting of acute trauma consider CT  characterization.  Right knee: IMPRESSION:  Tricompartmental knee replacement without evidence for an acute  fracture  or dislocation.   I agree with the radiologist interpretation  Medicines ordered and prescription drug management:   I have reviewed the patients home medicines and have made adjustments as needed   Test Considered:  ct   Critical Interventions:  Knee immobilizer   Consultations Obtained:  I requested consultation with Dr. Peri Maris PA,  and discussed lab and imaging findings as well as pertinent plan - she recommends knee immobilizer and pain control.  The office will call pt for an appt for this week.  Problem List / ED Course:  Patella fx:  pt placed in a knee immobilizer.  She is given a walker to use as needed.  She has pain medication from her recent surgery. Serosanguinous drainage from incision site:  mild.  I suspect it is self limiting.  She is to call Dr. Dema Severin if the drainage worsens.  Dressing applied.   Reevaluation:  After the interventions noted above, I reevaluated the patient and found that they have :improved   Social Determinants of Health:  Lives alone   Dispostion:  After consideration of the diagnostic results and the patients response to treatment, I feel that the patent would benefit from discharge with outpatient f/u.          Final Clinical Impression(s) / ED Diagnoses Final diagnoses:  Closed displaced fracture of left patella, unspecified fracture morphology, initial encounter    Rx / DC Orders ED Discharge Orders     None         Isla Pence, MD 10/21/21 1103

## 2021-10-21 NOTE — ED Triage Notes (Signed)
Patient is experiencing post-op problems from a Laparoscopic  bowel resection on September 21st after a fall yesterday.  Pt also states she wrenched her left knee.

## 2021-10-21 NOTE — ED Notes (Addendum)
Note placed in error

## 2021-10-24 DIAGNOSIS — Z96652 Presence of left artificial knee joint: Secondary | ICD-10-CM | POA: Diagnosis not present

## 2021-10-28 DIAGNOSIS — M25512 Pain in left shoulder: Secondary | ICD-10-CM | POA: Diagnosis not present

## 2021-10-29 ENCOUNTER — Ambulatory Visit: Payer: Medicare Other | Attending: Cardiology | Admitting: *Deleted

## 2021-10-29 DIAGNOSIS — Z5181 Encounter for therapeutic drug level monitoring: Secondary | ICD-10-CM | POA: Diagnosis not present

## 2021-10-29 DIAGNOSIS — I480 Paroxysmal atrial fibrillation: Secondary | ICD-10-CM

## 2021-10-29 LAB — POCT INR: INR: 2.9 (ref 2.0–3.0)

## 2021-10-29 NOTE — Patient Instructions (Signed)
Continue warfarin 1.5 tablets daily except for 2 tablets on Sundays and Thursdays. Recheck 3 wks

## 2021-11-06 ENCOUNTER — Ambulatory Visit
Admission: EM | Admit: 2021-11-06 | Discharge: 2021-11-06 | Disposition: A | Discharge status: Home / Self Care | Payer: Medicare Other | Attending: Nurse Practitioner | Admitting: Nurse Practitioner

## 2021-11-06 ENCOUNTER — Ambulatory Visit: Payer: Medicare Other

## 2021-11-06 ENCOUNTER — Other Ambulatory Visit: Payer: Self-pay

## 2021-11-06 ENCOUNTER — Encounter: Payer: Self-pay | Admitting: Emergency Medicine

## 2021-11-06 DIAGNOSIS — R42 Dizziness and giddiness: Secondary | ICD-10-CM | POA: Diagnosis not present

## 2021-11-06 MED ORDER — MECLIZINE HCL 12.5 MG PO TABS: 12.5000 mg | ORAL_TABLET | Freq: Three times a day (TID) | ORAL | 0 refills | Status: DC | PRN

## 2021-11-06 NOTE — ED Provider Notes (Signed)
[facility]    CSN: [id number] Arrival date & time: 11/06/21  1218      History   Chief Complaint No chief complaint on file.   HPI Melissa Velazquez is a 70 y.o. female.   The history is provided by the patient.   Patient presents for complaints of dizziness that has been present for the last 2 days.  Patient states that she notices that her dizziness worsens when she goes from a sitting to a standing position and with bending forward.  She states lying down or turning on her side improves her symptoms.  She states that she has also had intermittent nausea with her dizziness, but states she was able to eat toast this morning and keep it down.  She does have a history of atrial fibrillation, but states that it is mostly controlled.  States that she has had to take metoprolol over the last 2 days when she suspects that she is in atrial fibrillation.  Patient also complains of fullness in the left ear.  She denies fever, chills, headache, change in vision, blurred vision, chest pain, abdominal pain, vomiting, or diarrhea.  Patient states that she currently is drinking approximately 32 ounces of water daily.  States that she recently had an MRI of her neck and have some abnormalities between C6 and C7, and is wondering if this may be contributing to her symptoms.  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    Patient  Active Problem List   Diagnosis Date Noted   S/P right hemicolectomy 10/09/2021   Genetic testing 09/01/2021   Renal carcinoma, left ([place]) 08/06/2021   Rectal bleeding 06/12/2021   Atrial fibrillation ([place]) 04/01/2020   Encounter for therapeutic drug monitoring 04/01/2020   Acute blood loss anemia 02/09/2020   Atrial fibrillation with rapid ventricular response ([place]) 01/29/2020   Hypoxia 01/29/2020   Hypothyroidism 01/29/2020   OA (osteoarthritis) of knee 01/24/2020   Bilateral primary osteoarthritis of knee 01/24/2020   Seasonal and perennial allergic rhinitis 12/24/2019   Allergic contact dermatitis 12/24/2019   Mild intermittent asthma, uncomplicated 32/95/1884   IBS (irritable bowel syndrome) 04/11/2019   History of colonic polyps 11/25/2016   Renal mass 08/21/2016   Upper airway cough syndrome 07/06/2014   Essential hypertension 07/06/2014   Personal history of colonic polyps 09/17/2010   BACK PAIN 09/14/2008   HIRSUTISM 06/15/2008   GLUCOSE INTOLERANCE 01/09/2008   NEOPLASM, MALIGNANT, UTERUS 12/27/2006   THYROID NODULE 12/27/2006   DIVERTICULAR DISEASE 06/03/2006   OBESITY NOS 03/09/2006   DEPRESSION 03/09/2006   ALLERGIC RHINITIS 03/09/2006   ASTHMA 03/09/2006   GERD 03/09/2006   ELEVATED BLOOD PRESSURE WITHOUT DIAGNOSIS OF  HYPERTENSION 03/09/2006    Past Surgical History:  Procedure Laterality Date   APPENDECTOMY  01/19/2006   BIOPSY  12/14/2016   Procedure: BIOPSY;  Surgeon: Rogene Houston, MD;  [facility];  Service: Endoscopy;;  colon   BIOPSY  07/25/2021   Procedure: BIOPSY;  Surgeon: Harvel Quale, MD;  [facility];  Service: Gastroenterology;;   CHOLECYSTECTOMY  01/19/2006   COLONOSCOPY N/A 11/24/2012   Procedure: COLONOSCOPY;  Surgeon: Rogene Houston, MD;  [facility];  Service: Endoscopy;  Laterality: N/A;  830   COLONOSCOPY N/A 12/14/2016   Procedure: COLONOSCOPY;  Surgeon: Rogene Houston, MD;   [facility];  Service: Endoscopy;  Laterality: N/A;  12:00   COLONOSCOPY  07/25/2021   Bx of polyp CA +   COLONOSCOPY WITH PROPOFOL N/A 07/25/2021   Procedure: COLONOSCOPY WITH PROPOFOL;  Surgeon: Harvel Quale, MD;  [facility];  Service: Gastroenterology;  Laterality: N/A;  115 ASA 1   CYSTOSCOPY WITH STENT PLACEMENT N/A 10/09/2021   Procedure: CYSTOSCOPY WITH STENT PLACEMENT;  Surgeon: Ardis Hughs, MD;  Location: [facility];  Service: Urology;  Laterality: N/A;  1. Cystoscopy, retrograde pyelogram with interpretation 2. Bilateral temporary ureteral stent placement   IR RADIOLOGIST EVAL & MGMT  06/23/2021   IR RADIOLOGIST EVAL & MGMT  09/05/2021   LAPAROSCOPIC RIGHT HEMI COLECTOMY Right 10/09/2021   Procedure: LAPAROSCOPIC RIGHT HEMI COLECTOMY;  Surgeon: Ileana Roup, MD;  Location: [facility];  Service: General;  Laterality: Right;   POLYPECTOMY  12/14/2016   Procedure: POLYPECTOMY;  Surgeon: Rogene Houston, MD;  [facility];  Service: Endoscopy;;  colon   POLYPECTOMY  07/25/2021   Procedure: POLYPECTOMY;  Surgeon: Harvel Quale, MD;  [facility];  Service: Gastroenterology;;   RADIOLOGY WITH ANESTHESIA N/A 08/06/2021   Procedure: IR WITH ANESTHESIA CRYOABLATION;  Surgeon: Aletta Edouard, MD;  Location[facility];  Service: Radiology;  Laterality: N/A;   ROBOTIC ASSITED PARTIAL NEPHRECTOMY Left 08/21/2016   Procedure: XI ROBOTIC ASSITED PARTIAL NEPHRECTOMY;  Surgeon: Ardis Hughs, MD;  [facility];  Service: Urology;  Laterality: Left;   TOTAL ABDOMINAL HYSTERECTOMY  01/20/1999   TOTAL KNEE ARTHROPLASTY Bilateral 01/24/2020   Procedure: TOTAL KNEE BILATERAL;  Surgeon: Gaynelle Arabian, MD;  Location: [facility];  Service: Orthopedics;  Laterality: Bilateral;   WISDOM TOOTH EXTRACTION      OB History   No obstetric history on file.      Home Medications    Prior to Admission medications   Medication Sig  Start Date End Date Taking? Authorizing Provider  meclizine (ANTIVERT) 12.5 MG tablet Take 1 tablet (12.5 mg total) by mouth 3 (three) times daily as needed for dizziness. 11/06/21  Yes Zaryan Yakubov-Warren, Alda Lea, NP  albuterol (PROVENTIL HFA;VENTOLIN HFA) 108 (90 Base) MCG/ACT inhaler Inhale 2 puffs into the lungs every 6 (six) hours as needed for wheezing or shortness of breath. Patient not taking: Reported on 10/21/2021    [provider]  cetirizine (ZYRTEC) 10 MG tablet Take 10 mg by mouth daily as needed for allergies.    [provider]  Cholecalciferol (VITAMIN D3) 125 MCG (5000 UT) CAPS Take 5,000 Units by mouth daily.    [provider]  fluconazole (DIFLUCAN) 150 MG tablet Take 150 mg by mouth See admin instructions. Take 150 mg every 4 days as needed for lichen planus or thrush    [provider]  fluticasone (FLONASE) 50 MCG/ACT nasal spray Place 1 spray into both nostrils 2 (two) times daily as needed for allergies or rhinitis. 12/22/19   Valentina Shaggy, MD  hydrocortisone (ANUSOL-HC) 2.5 % rectal cream Apply 1 Application topically 4 (four) times daily as needed for hemorrhoids. 05/27/21   [provider]  levothyroxine (SYNTHROID, LEVOTHROID) 75 MCG tablet Take 75 mcg by mouth daily before breakfast.    [provider]  magic mouthwash SOLN Take 5 mLs by mouth 2 (two) times daily as needed (Thrush / lichen planus). Swish and spit    [provider]  metoprolol tartrate (LOPRESSOR) 25 MG tablet Take 0.5 tablets (12.5 mg total) by mouth as needed (for palpitations). 02/20/20   Richardson Dopp T, PA-C  ondansetron (ZOFRAN) 8 MG tablet Take 8 mg by mouth every 8 (eight) hours as needed. 04/19/21   [provider]  Polyethyl Glycol-Propyl Glycol (SYSTANE OP) Place 2-3 drops into both eyes 4 (four) times daily as needed (itching / burning).    [provider]  sertraline (ZOLOFT) 100 MG tablet Take 150 mg by mouth  daily with breakfast.    [provider]  simethicone (MYLICON) 250 MG chewable tablet Chew 125 mg by mouth every 6 (six) hours as needed for flatulence.    [provider]  triamcinolone ointment (KENALOG) 0.5 % Apply 1 Application topically 2 (two) times daily as needed (itching).    [provider]  warfarin (COUMADIN) 5 MG tablet Take 1 1/2 - 2 tablets daily as directed by coumadin clinic Patient taking differently: Take 7.5-10 mg by mouth See admin instructions. Take 7.5 mg on Mon, Tues, Wed, Fri, and Sat Take 10 mg on Thurs and Sun 06/26/21   Arnoldo Lenis, MD    Family History Family History  Problem Relation Age of Onset   Breast cancer Mother    Diabetes Mother    Obesity Sister    Asthma Sister    Eczema Sister    COPD Father        smoked   Prostate cancer Father    Breast cancer Maternal Aunt    Urticaria Maternal Grandfather    Colon cancer Neg Hx     Social History Social History   Tobacco Use   Smoking status: Former    Packs/day: 1.00    Years: 20.00    Total pack years: 20.00    Types: Cigarettes    Quit date: 01/19/1998    Years since quitting: 23.8    Passive exposure: Past   Smokeless tobacco: Never  Vaping Use   Vaping Use: Never used  Substance Use Topics   Alcohol use: Yes    Alcohol/week: 0.0 standard drinks of alcohol    Comment: rare   Drug use: No     Allergies   Triple antibiotic [bacitracin-neomycin-polymyxin], Levaquin [levofloxacin], Oxycodone, Capsaicin, Elastic bandages & [zinc], and Nickel   Review of Systems Review of Systems Per HPI  Physical Exam Triage Vital Signs ED Triage Vitals  Enc Vitals Group     BP 11/06/21 1223 (!) 140/80     Pulse Rate 11/06/21 1223 75     Resp 11/06/21 1223 18     Temp 11/06/21 1223 97.6 F (36.4 C)     Temp Source 11/06/21 1223 Oral     SpO2 11/06/21 1223 98 %     Weight --      Height --      Head Circumference --  Peak Flow --      Pain Score  11/06/21 1224 7     Pain Loc --      Pain Edu? --      Excl. in [place]? --    Orthostatic VS for the past 24 hrs:  BP- Lying Pulse- Lying BP- Sitting Pulse- Sitting BP- Standing at 0 minutes Pulse- Standing at 0 minutes  11/06/21 1227 123/77 64 127/83 66 153/84 70    Updated Vital Signs BP (!) 140/80 (BP Location: Right Arm)   Pulse 75   Temp 97.6 F (36.4 C) (Oral)   Resp 18   SpO2 98%   Visual Acuity Right Eye Distance:   Left Eye Distance:   Bilateral Distance:    Right Eye Near:   Left Eye Near:    Bilateral Near:     Physical Exam Vitals and nursing note reviewed.  Constitutional:      General: She is not in acute distress.    Appearance: Normal appearance.  HENT:     Head: Normocephalic.     Right Ear: Tympanic membrane, ear canal and external ear normal.     Left Ear: Ear canal and external ear normal.     Nose: Nose normal.     Mouth/Throat:     Mouth: Mucous membranes are moist.     Pharynx: No oropharyngeal exudate or posterior oropharyngeal erythema.  Eyes:     Extraocular Movements: Extraocular movements intact.     Pupils: Pupils are equal, round, and reactive to light.  Cardiovascular:     Rate and Rhythm: Normal rate.     Pulses: Normal pulses.     Heart sounds: Normal heart sounds.  Pulmonary:     Effort: Pulmonary effort is normal.     Breath sounds: Normal breath sounds.  Abdominal:     General: Bowel sounds are normal.     Palpations: Abdomen is soft.     Tenderness: There is no abdominal tenderness.  Musculoskeletal:     Cervical back: Normal range of motion.  Skin:    General: Skin is warm and dry.  Neurological:     General: No focal deficit present.     Mental Status: She is alert and oriented to person, place, and time.     GCS: GCS eye subscore is 4. GCS verbal subscore is 5. GCS motor subscore is 6.     Cranial Nerves: Cranial nerves 2-12 are intact.     Motor: Motor function is intact.     Coordination: Coordination is intact.       UC Treatments / Results  Labs (all labs ordered are listed, but only abnormal results are displayed) Labs Reviewed - No data to display  EKG: NSR, no STEMI or atrial fibrillation   Radiology No results found.  Procedures Procedures (including critical care time)  Medications Ordered in UC Medications - No data to display  Initial Impression / Assessment and Plan / UC Course  I have reviewed the triage vital signs and the nursing notes.  Pertinent labs & imaging results that were available during my care of the patient were reviewed by me and considered in my medical decision making (see chart for details).  Patient presents for complaints of dizziness with nausea that started approximately 2 days ago.  Patient's vital signs are stable, she is in no acute distress.  She is well-appearing.  Orthostatic vital signs were performed which did not show any change in her heart rate although her  blood pressure did increase going from a sitting to standing position.  Her EKG did not show that she is currently in atrial fibrillation.  Neurological exam is within normal limits.  Based on her current symptoms and presentation, symptoms are consistent with vertigo.  We will start patient on meclizine 12.5 mg for her symptoms.  I suspect that the nausea will improve with the improvement of her dizziness.  Supportive care recommendations were also provided to the patient to include increasing fluids, avoiding sudden movement, and continuing to use her cane for ambulation.  Patient was given strict indications of when to go to the emergency department.  Patient was advised to follow-up with her PCP within the next 5 to 7 days for reevaluation.  Patient verbalizes understanding.  All questions were answered.  Patient is stable for discharge. Final Clinical Impressions(s) / UC Diagnoses   Final diagnoses:  Dizziness  Vertigo     Discharge Instructions      Your EKG does not show that you are  in atrial fibrillation. Take medication as prescribed.  Please be advised that the medication will make you drowsy.  No driving, operate any heavy equipment, or drinking alcohol while taking the medication. Increase fluids.  Try to drink at least 7-8 eight ounce glasses of water daily for hydration. Avoid sudden or quick movement to prevent falls. Make sure you are using your cane each time you walk or move around. When you wake up in the morning, before rising, sit on the side of the bed before standing and walking. If you develop weakness, slurred speech, severe headache, change in vision, sudden loss of vision, or other concerns, please go to the emergency department immediately. Please follow-up with your primary care physician within the next 5 to 7 days for reevaluation.       ED Prescriptions     Medication Sig Dispense Auth. Provider   meclizine (ANTIVERT) 12.5 MG tablet Take 1 tablet (12.5 mg total) by mouth 3 (three) times daily as needed for dizziness. 30 tablet Benjiman Sedgwick-Warren, Alda Lea, NP      PDMP not reviewed this encounter.   Tish Men, NP 11/06/21 1317

## 2021-11-06 NOTE — ED Triage Notes (Signed)
C/o Dizziness. states she always has a brief period of dizziness when she gets up from lying down.  States she fell on  Monday night due to dizziness.  States left ear feels stopped up and is having pain in neck

## 2021-11-06 NOTE — Discharge Instructions (Addendum)
Your EKG does not show that you are in atrial fibrillation. Take medication as prescribed.  Please be advised that the medication will make you drowsy.  No driving, operate any heavy equipment, or drinking alcohol while taking the medication. Increase fluids.  Try to drink at least 7-8 eight ounce glasses of water daily for hydration. Avoid sudden or quick movement to prevent falls. Make sure you are using your cane each time you walk or move around. When you wake up in the morning, before rising, sit on the side of the bed before standing and walking. If you develop weakness, slurred speech, severe headache, change in vision, sudden loss of vision, or other concerns, please go to the emergency department immediately. Please follow-up with your primary care physician within the next 5 to 7 days for reevaluation.

## 2021-11-11 DIAGNOSIS — R42 Dizziness and giddiness: Secondary | ICD-10-CM | POA: Diagnosis not present

## 2021-11-14 DIAGNOSIS — Z23 Encounter for immunization: Secondary | ICD-10-CM | POA: Diagnosis not present

## 2021-11-17 ENCOUNTER — Inpatient Hospital Stay: Payer: Medicare Other | Attending: Hematology | Admitting: Hematology

## 2021-11-17 VITALS — BP 129/79 | HR 69 | Temp 98.6°F | Resp 18 | Ht 64.0 in | Wt 198.5 lb

## 2021-11-17 DIAGNOSIS — E039 Hypothyroidism, unspecified: Secondary | ICD-10-CM | POA: Diagnosis not present

## 2021-11-17 DIAGNOSIS — I129 Hypertensive chronic kidney disease with stage 1 through stage 4 chronic kidney disease, or unspecified chronic kidney disease: Secondary | ICD-10-CM | POA: Insufficient documentation

## 2021-11-17 DIAGNOSIS — Z8542 Personal history of malignant neoplasm of other parts of uterus: Secondary | ICD-10-CM | POA: Insufficient documentation

## 2021-11-17 DIAGNOSIS — Z79890 Hormone replacement therapy (postmenopausal): Secondary | ICD-10-CM | POA: Diagnosis not present

## 2021-11-17 DIAGNOSIS — Z79899 Other long term (current) drug therapy: Secondary | ICD-10-CM | POA: Insufficient documentation

## 2021-11-17 DIAGNOSIS — C180 Malignant neoplasm of cecum: Secondary | ICD-10-CM | POA: Diagnosis not present

## 2021-11-17 DIAGNOSIS — N189 Chronic kidney disease, unspecified: Secondary | ICD-10-CM | POA: Diagnosis not present

## 2021-11-17 DIAGNOSIS — Z803 Family history of malignant neoplasm of breast: Secondary | ICD-10-CM | POA: Diagnosis not present

## 2021-11-17 DIAGNOSIS — Z7901 Long term (current) use of anticoagulants: Secondary | ICD-10-CM | POA: Diagnosis not present

## 2021-11-17 DIAGNOSIS — C642 Malignant neoplasm of left kidney, except renal pelvis: Secondary | ICD-10-CM | POA: Diagnosis not present

## 2021-11-17 DIAGNOSIS — Z87891 Personal history of nicotine dependence: Secondary | ICD-10-CM | POA: Diagnosis not present

## 2021-11-17 NOTE — Progress Notes (Signed)
 [facility] [address] [place], [place] [address]   CLINIC:  Medical Oncology/Hematology  PCP:  Golding, John, MD [address] [place] [address] [phone]   REASON FOR VISIT:  Follow-up for stage I cecal cancer and left kidney RCC  PRIOR THERAPY: Right hemicolectomy on 10/09/2021  NGS Results: MSI results pending  CURRENT THERAPY: Surveillance  BRIEF ONCOLOGIC HISTORY:  Oncology History   No history exists.    CANCER STAGING: Cancer Staging  Cecal cancer (HCC) Staging form: Colon and Rectum, AJCC 8th Edition - Clinical stage from 11/17/2021: Stage I (cT1, cN0, cM0) - Unsigned    INTERVAL HISTORY:  Melissa Velazquez 71 y.o. female seen for follow-up of cecal cancer and kidney cancer.  She is recovering well from surgery.  She reports energy levels are 80%.  Denies any new onset pains.  She has been having loose bowel movement since surgery.    REVIEW OF SYSTEMS:  Review of Systems  Psychiatric/Behavioral:  The patient is nervous/anxious.   All other systems reviewed and are negative.    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   Past Surgical History:  Procedure Laterality Date   APPENDECTOMY  01/19/2006   BIOPSY  12/14/2016   Procedure: BIOPSY;  Surgeon: Rehman, Najeeb U, MD;  [facility];  Service: Endoscopy;;  colon   BIOPSY   07/25/2021   Procedure: BIOPSY;  Surgeon: Castaneda Mayorga, Daniel, MD;  [facility];  Service: Gastroenterology;;   CHOLECYSTECTOMY  01/19/2006   COLONOSCOPY N/A 11/24/2012   Procedure: COLONOSCOPY;  Surgeon: Najeeb U Rehman, MD;  [facility];  Service: Endoscopy;  Laterality: N/A;  830   COLONOSCOPY N/A 12/14/2016   Procedure: COLONOSCOPY;  Surgeon: Rehman, Najeeb U, MD;  [facility];  Service: Endoscopy;  Laterality: N/A;  12:00   COLONOSCOPY  07/25/2021   Bx of polyp CA +   COLONOSCOPY WITH PROPOFOL N/A 07/25/2021   Procedure: COLONOSCOPY WITH PROPOFOL;  Surgeon: Castaneda Mayorga, Daniel, MD;  [facility];  Service: Gastroenterology;  Laterality: N/A;  115 ASA 1   CYSTOSCOPY WITH STENT PLACEMENT N/A 10/09/2021   Procedure: CYSTOSCOPY WITH STENT PLACEMENT;  Surgeon: Herrick, Benjamin W, MD;  Location: [facility];  Service: Urology;  Laterality: N/A;  1. Cystoscopy, retrograde pyelogram with interpretation 2. Bilateral temporary ureteral stent placement   IR RADIOLOGIST EVAL & MGMT  06/23/2021   IR RADIOLOGIST EVAL & MGMT  09/05/2021   LAPAROSCOPIC RIGHT HEMI COLECTOMY Right 10/09/2021   Procedure: LAPAROSCOPIC RIGHT HEMI COLECTOMY;  Surgeon: White, Christopher M, MD;  Location: [facility];  Service: General;  Laterality: Right;     POLYPECTOMY  12/14/2016   Procedure: POLYPECTOMY;  Surgeon: Rehman, Najeeb U, MD;  [facility];  Service: Endoscopy;;  colon   POLYPECTOMY  07/25/2021   Procedure: POLYPECTOMY;  Surgeon: Castaneda Mayorga, Daniel, MD;  [facility];  Service: Gastroenterology;;   RADIOLOGY WITH ANESTHESIA N/A 08/06/2021   Procedure: IR WITH ANESTHESIA CRYOABLATION;  Surgeon: Yamagata, Glenn, MD;  [facility]: [facility];  Service: Radiology;  Laterality: N/A;   ROBOTIC ASSITED PARTIAL NEPHRECTOMY Left 08/21/2016   Procedure: XI ROBOTIC ASSITED PARTIAL NEPHRECTOMY;  Surgeon: Herrick, Benjamin W, MD;  [facility];  Service:  Urology;  Laterality: Left;   TOTAL ABDOMINAL HYSTERECTOMY  01/20/1999   TOTAL KNEE ARTHROPLASTY Bilateral 01/24/2020   Procedure: TOTAL KNEE BILATERAL;  Surgeon: Aluisio, Frank, MD;  Location: [facility];  Service: Orthopedics;  Laterality: Bilateral;   WISDOM TOOTH EXTRACTION       SOCIAL HISTORY:  Social History   Socioeconomic History   Marital status: Single    Spouse name: Not on file   Number of children: 0   Years of education: Not on file   Highest education level: Not on file  Occupational History   Occupation: Psychologist  Tobacco Use   Smoking status: Former    Packs/day: 1.00    Years: 20.00    Total pack years: 20.00    Types: Cigarettes    Quit date: 01/19/1998    Years since quitting: 23.8    Passive exposure: Past   Smokeless tobacco: Never  Vaping Use   Vaping Use: Never used  Substance and Sexual Activity   Alcohol use: Yes    Alcohol/week: 0.0 standard drinks of alcohol    Comment: rare   Drug use: No   Sexual activity: Not Currently  Other Topics Concern   Not on file  Social History Narrative   2 cups of caffeine daily    Social Determinants of Health   Financial Resource Strain: Not on file  Food Insecurity: No Food Insecurity (10/09/2021)   Hunger Vital Sign    Worried About Running Out of Food in the Last Year: Never true    Ran Out of Food in the Last Year: Never true  Transportation Needs: No Transportation Needs (10/09/2021)   PRAPARE - Transportation    Lack of Transportation (Medical): No    Lack of Transportation (Non-Medical): No  Physical Activity: Not on file  Stress: Not on file  Social Connections: Not on file  Intimate Partner Violence: Not At Risk (10/09/2021)   Humiliation, Afraid, Rape, and Kick questionnaire    Fear of Current or Ex-Partner: No    Emotionally Abused: No    Physically Abused: No    Sexually Abused: No    FAMILY HISTORY:  Family History  Problem Relation Age of Onset   Breast cancer Mother    Diabetes  Mother    Obesity Sister    Asthma Sister    Eczema Sister    COPD Father        smoked   Prostate cancer Father    Breast cancer Maternal Aunt    Urticaria Maternal Grandfather    Colon cancer Neg Hx     CURRENT MEDICATIONS:  Outpatient Encounter Medications as of 11/17/2021  Medication Sig Note   albuterol (PROVENTIL HFA;VENTOLIN HFA) 108 (90 Base) MCG/ACT inhaler Inhale 2 puffs into the lungs every 6 (six) hours as needed for wheezing or shortness of breath.    cetirizine (ZYRTEC) 10 MG tablet Take 10   mg by mouth daily as needed for allergies.    Cholecalciferol (VITAMIN D3) 125 MCG (5000 UT) CAPS Take 5,000 Units by mouth daily.    fluconazole (DIFLUCAN) 150 MG tablet Take 150 mg by mouth See admin instructions. Take 150 mg every 4 days as needed for lichen planus or thrush    fluticasone (FLONASE) 50 MCG/ACT nasal spray Place 1 spray into both nostrils 2 (two) times daily as needed for allergies or rhinitis.    hydrocortisone (ANUSOL-HC) 2.5 % rectal cream Apply 1 Application topically 4 (four) times daily as needed for hemorrhoids.    levothyroxine (SYNTHROID, LEVOTHROID) 75 MCG tablet Take 75 mcg by mouth daily before breakfast.    magic mouthwash SOLN Take 5 mLs by mouth 2 (two) times daily as needed (Thrush / lichen planus). Swish and spit    meclizine (ANTIVERT) 25 MG tablet Take 25 mg by mouth every 6 (six) hours as needed.    metoprolol tartrate (LOPRESSOR) 25 MG tablet Take 0.5 tablets (12.5 mg total) by mouth as needed (for palpitations).    ondansetron (ZOFRAN) 8 MG tablet Take 8 mg by mouth every 8 (eight) hours as needed.    Polyethyl Glycol-Propyl Glycol (SYSTANE OP) Place 2-3 drops into both eyes 4 (four) times daily as needed (itching / burning).    sertraline (ZOLOFT) 100 MG tablet Take 150 mg by mouth daily with breakfast.    simethicone (MYLICON) 762 MG chewable tablet Chew 125 mg by mouth every 6 (six) hours as needed for flatulence.    triamcinolone ointment  (KENALOG) 0.5 % Apply 1 Application topically 2 (two) times daily as needed (itching).    warfarin (COUMADIN) 5 MG tablet Take 1 1/2 - 2 tablets daily as directed by coumadin clinic (Patient taking differently: Take 7.5-10 mg by mouth See admin instructions. Take 7.5 mg on Mon, Tues, Wed, Fri, and Sat Take 10 mg on Thurs and Sun) 10/21/2021: Pt took 37m 10-20-21    [DISCONTINUED] meclizine (ANTIVERT) 12.5 MG tablet Take 1 tablet (12.5 mg total) by mouth 3 (three) times daily as needed for dizziness.    No facility-administered encounter medications on file as of 11/17/2021.    ALLERGIES:  Allergies  Allergen Reactions   Triple Antibiotic [Bacitracin-Neomycin-Polymyxin] Anaphylaxis   Levaquin [Levofloxacin] Other (See Comments)    Stomach upset   Oxycodone     Tremors and brain fog   Capsaicin Rash   Elastic Bandages & [Zinc] Rash   Nickel Itching and Rash     PHYSICAL EXAM:  ECOG Performance status: 1  Vitals:   11/17/21 1017  BP: 129/79  Pulse: 69  Resp: 18  Temp: 98.6 F (37 C)  SpO2: 96%   Filed Weights   11/17/21 1017  Weight: 198 lb 8 oz (90 kg)   Physical Exam Vitals reviewed.  Constitutional:      Appearance: Normal appearance.  Cardiovascular:     Rate and Rhythm: Normal rate and regular rhythm.     Pulses: Normal pulses.     Heart sounds: Normal heart sounds.  Pulmonary:     Breath sounds: Normal breath sounds.  Abdominal:     Palpations: Abdomen is soft. There is no mass.  Neurological:     Mental Status: She is alert.  Psychiatric:        Mood and Affect: Mood normal.        Behavior: Behavior normal.      LABORATORY DATA:  I have reviewed the labs as listed.  CBC  Component Value Date/Time   WBC 6.1 10/12/2021 1331   RBC 3.24 (L) 10/12/2021 1331   HGB 10.3 (L) 10/12/2021 1331   HCT 31.7 (L) 10/12/2021 1331   PLT 165 10/12/2021 1331   MCV 97.8 10/12/2021 1331   MCH 31.8 10/12/2021 1331   MCHC 32.5 10/12/2021 1331   RDW 13.4 10/12/2021  1331   LYMPHSABS 1.1 07/28/2021 1459   MONOABS 0.4 07/28/2021 1459   EOSABS 0.3 07/28/2021 1459   BASOSABS 0.0 07/28/2021 1459      Latest Ref Rng & Units 10/12/2021    4:39 AM 10/11/2021    4:56 AM 10/10/2021    4:35 AM  CMP  Glucose 70 - 99 mg/dL 97  92  107   BUN 8 - 23 mg/dL _0 Creatinine 0.44 - 1.00 mg/dL 0.85  0.98  0.95   Sodium 135 - 145 mmol/L 142  143  139   Potassium 3.5 - 5.1 mmol/L 4.1  4.2  4.1   Chloride 98 - 111 mmol/L 107  108  106   CO2 22 - 32 mmol/L _1 Calcium 8.9 - 10.3 mg/dL 8.6  8.6  8.4     DIAGNOSTIC IMAGING:  I have independently reviewed the scans and discussed with the patient.  ASSESSMENT:  1.  Stage I (T1 N0 M0) cecal adenocarcinoma: - Colonoscopy (07/25/2021): 30 mm polyp in the cecum, in proximity to the appendiceal orifice with no infiltration. Polyp was carpet like. Positive for superficial focal ulceration. - Pathology: At least intramucosal adenocarcinoma/high-grade dysplasia.   - Laparoscopic right hemicolectomy on 10/09/2021 - Pathology: Well-differentiated invasive adenocarcinoma, margins negative, 0/25 lymph nodes involved, PT1PN0  2.  Left kidney clear-cell renal cell carcinoma: - Left partial nephrectomy on 08/21/2016 - Clear-cell renal cell carcinoma, Fuhrman grade 3, spanning 12.5 cm. Resection margins focally positive. Tumor limited to kidney. [id number]. - MRI of the abdomen (04/25/2021): Postoperative changes of prior partial left lower pole nephrectomy with enhancing soft tissue nodularity in the surgical bed measuring 10 x 6 mm concerning for local recurrence. - Cryoablation of left RCC recurrence on 08/06/2021  3.  History of endometrial cancer: - Initially diagnosed in 2001, stage I, TAH and BSO. - Appendiceal recurrence in 2008, status postresection followed by XRT for 6 weeks.  4.  Social/family history: - She lives by herself and is independent of ADLs and IADLs. - She is currently working as a Ecologist. - She quit smoking in 2000, smoked 1 pack/day for 20 years. - Father had prostate cancer. Mother had breast cancer. Maternal aunt had breast cancer. Paternal first cousin had thyroid cancer. Maternal grandmother had liver cancer. Another paternal first cousin died of cancer.   PLAN:  1.  Stage I (T1 N0 M0) cecal adenocarcinoma: - We reviewed pathology results in detail. - Would request MSI testing on pathology. - No indication for adjuvant chemotherapy. - We discussed surveillance plan with routine labs and CEA every 3 months in the first 2 years and CT scan every 6 months.  She had allergy to CT dye with vomiting after last few scans, I will switch her to MRI of the abdomen and pelvis which is also helpful in surveillance of kidney cancer. - RTC 4 months with repeat labs and MRI.  2.  Left kidney clear-cell renal cell carcinoma: - We will schedule MRI of the abdomen in 4 months.      Orders placed this encounter:  Orders Placed This Encounter  Procedures   MR Abdomen W Wo Contrast   MR PELVIS W WO CONTRAST   CBC with Differential   Comprehensive metabolic panel   CEA      Sreedhar Katragadda, MD [facility] [phone]  

## 2021-11-17 NOTE — Patient Instructions (Signed)
[place] [facility] Discharge Instructions   You were seen and examined today by Dr. Delton Coombes.  He discussed with you the pathology report which demonstrates you have Stage I colon cancer.   We will see you back in 4 months. We will obtain lab work and MRI of your abdomen and pelvis prior to your next visit.    Thank you for choosing [place] at [facility] to provide your oncology and hematology care.  To afford each patient quality time with our provider, please arrive at least 15 minutes before your scheduled appointment time.   If you have a lab appointment with the [facility] [place] please come in thru the Main Entrance and check in at the main information desk.  You need to re-schedule your appointment should you arrive 10 or more minutes late.  We strive to give you quality time with our providers, and arriving late affects you and other patients whose appointments are after yours.  Also, if you no show three or more times for appointments you may be dismissed from the clinic at the providers discretion.     Again, thank you for choosing [facility].  Our hope is that these requests will decrease the amount of time that you wait before being seen by our physicians.       _____________________________________________________________  Should you have questions after your visit to [facility], please contact our office at [phone] and follow the prompts.  Our office hours are 8:00 a.m. and 4:30 p.m. Monday - Friday.  Please note that voicemails left after 4:00 p.m. may not be returned until the following business day.  We are closed weekends and major holidays.  You do have access to a nurse 24-7, just call the main number to the clinic [phone] and do not press any options, hold on the line and a nurse will answer the phone.    For prescription refill requests, have your pharmacy contact our office and  allow 72 hours.    Due to Covid, you will need to wear a mask upon entering the hospital. If you do not have a mask, a mask will be given to you at the Main Entrance upon arrival. For doctor visits, patients may have 1 support person age 52 or older with them. For treatment visits, patients can not have anyone with them due to social distancing guidelines and our immunocompromised population.

## 2021-11-18 DIAGNOSIS — M1991 Primary osteoarthritis, unspecified site: Secondary | ICD-10-CM | POA: Diagnosis not present

## 2021-11-18 DIAGNOSIS — E039 Hypothyroidism, unspecified: Secondary | ICD-10-CM | POA: Diagnosis not present

## 2021-11-18 DIAGNOSIS — I10 Essential (primary) hypertension: Secondary | ICD-10-CM | POA: Diagnosis not present

## 2021-11-19 ENCOUNTER — Ambulatory Visit: Payer: Medicare Other | Attending: Cardiology | Admitting: *Deleted

## 2021-11-19 DIAGNOSIS — I480 Paroxysmal atrial fibrillation: Secondary | ICD-10-CM

## 2021-11-19 DIAGNOSIS — R030 Elevated blood-pressure reading, without diagnosis of hypertension: Secondary | ICD-10-CM

## 2021-11-19 DIAGNOSIS — Z5181 Encounter for therapeutic drug level monitoring: Secondary | ICD-10-CM

## 2021-11-19 LAB — POCT INR: INR: 3.1 — AB (ref 2.0–3.0)

## 2021-11-19 NOTE — Patient Instructions (Signed)
Decrease warfarin 1.5 tablets daily except for 2 tablets on Sundays Recheck in 4 wks

## 2021-11-21 LAB — SURGICAL PATHOLOGY

## 2021-12-16 DIAGNOSIS — M25562 Pain in left knee: Secondary | ICD-10-CM | POA: Diagnosis not present

## 2021-12-16 DIAGNOSIS — Z96652 Presence of left artificial knee joint: Secondary | ICD-10-CM | POA: Diagnosis not present

## 2021-12-16 DIAGNOSIS — M25561 Pain in right knee: Secondary | ICD-10-CM | POA: Diagnosis not present

## 2021-12-17 ENCOUNTER — Other Ambulatory Visit: Payer: Self-pay | Admitting: Cardiology

## 2021-12-17 ENCOUNTER — Ambulatory Visit: Payer: Medicare Other | Attending: Cardiology | Admitting: *Deleted

## 2021-12-17 DIAGNOSIS — Z5181 Encounter for therapeutic drug level monitoring: Secondary | ICD-10-CM | POA: Diagnosis not present

## 2021-12-17 DIAGNOSIS — I480 Paroxysmal atrial fibrillation: Secondary | ICD-10-CM

## 2021-12-17 LAB — POCT INR: INR: 3.6 — AB (ref 2.0–3.0)

## 2021-12-17 NOTE — Patient Instructions (Signed)
Hold warfarin tonight then decrease dose to 1 tablet daily Recheck in 4 wks

## 2021-12-17 NOTE — Telephone Encounter (Signed)
Refill request for warfarin:  Last INR was 3.6 on 12/17/21 Next INR due 01/15/22 LOV was 07/17/21  Grayce Sessions MD  Refill approved.

## 2021-12-31 DIAGNOSIS — M5030 Other cervical disc degeneration, unspecified cervical region: Secondary | ICD-10-CM | POA: Diagnosis not present

## 2021-12-31 DIAGNOSIS — K589 Irritable bowel syndrome without diarrhea: Secondary | ICD-10-CM | POA: Diagnosis not present

## 2021-12-31 DIAGNOSIS — I10 Essential (primary) hypertension: Secondary | ICD-10-CM | POA: Diagnosis not present

## 2021-12-31 DIAGNOSIS — E039 Hypothyroidism, unspecified: Secondary | ICD-10-CM | POA: Diagnosis not present

## 2021-12-31 DIAGNOSIS — Z6834 Body mass index (BMI) 34.0-34.9, adult: Secondary | ICD-10-CM | POA: Diagnosis not present

## 2021-12-31 DIAGNOSIS — E119 Type 2 diabetes mellitus without complications: Secondary | ICD-10-CM | POA: Diagnosis not present

## 2021-12-31 DIAGNOSIS — R42 Dizziness and giddiness: Secondary | ICD-10-CM | POA: Diagnosis not present

## 2021-12-31 DIAGNOSIS — E559 Vitamin D deficiency, unspecified: Secondary | ICD-10-CM | POA: Diagnosis not present

## 2021-12-31 DIAGNOSIS — Z0001 Encounter for general adult medical examination with abnormal findings: Secondary | ICD-10-CM | POA: Diagnosis not present

## 2021-12-31 DIAGNOSIS — Z1331 Encounter for screening for depression: Secondary | ICD-10-CM | POA: Diagnosis not present

## 2021-12-31 DIAGNOSIS — E6609 Other obesity due to excess calories: Secondary | ICD-10-CM | POA: Diagnosis not present

## 2021-12-31 DIAGNOSIS — I4891 Unspecified atrial fibrillation: Secondary | ICD-10-CM | POA: Diagnosis not present

## 2022-01-15 ENCOUNTER — Ambulatory Visit: Payer: Medicare Other | Attending: Cardiology | Admitting: *Deleted

## 2022-01-15 DIAGNOSIS — Z5181 Encounter for therapeutic drug level monitoring: Secondary | ICD-10-CM | POA: Diagnosis not present

## 2022-01-15 DIAGNOSIS — I480 Paroxysmal atrial fibrillation: Secondary | ICD-10-CM

## 2022-01-15 LAB — POCT INR: INR: 1.9 — AB (ref 2.0–3.0)

## 2022-01-15 NOTE — Patient Instructions (Signed)
Take warfarin 2 tablets tonight then resume 1 1/2 tablets daily Recheck in 4 wks

## 2022-02-12 ENCOUNTER — Ambulatory Visit: Payer: Medicare Other | Attending: Cardiology | Admitting: *Deleted

## 2022-02-12 DIAGNOSIS — Z5181 Encounter for therapeutic drug level monitoring: Secondary | ICD-10-CM | POA: Diagnosis not present

## 2022-02-12 DIAGNOSIS — I480 Paroxysmal atrial fibrillation: Secondary | ICD-10-CM

## 2022-02-12 LAB — POCT INR: INR: 2 (ref 2.0–3.0)

## 2022-02-12 NOTE — Patient Instructions (Signed)
Increase warfarin to 1 1/2 tablets daily except 2 tablets on Thursdays Recheck in 4 wks

## 2022-03-12 ENCOUNTER — Inpatient Hospital Stay: Payer: Medicare Other

## 2022-03-12 ENCOUNTER — Ambulatory Visit (HOSPITAL_COMMUNITY): Payer: Medicare Other

## 2022-03-12 ENCOUNTER — Ambulatory Visit (HOSPITAL_COMMUNITY): Admission: RE | Admit: 2022-03-12 | Payer: Medicare Other | Source: Ambulatory Visit

## 2022-03-19 ENCOUNTER — Other Ambulatory Visit: Payer: Medicare Other

## 2022-03-19 ENCOUNTER — Ambulatory Visit: Payer: Medicare Other | Attending: Cardiology | Admitting: *Deleted

## 2022-03-19 ENCOUNTER — Ambulatory Visit: Payer: Medicare Other | Admitting: Hematology

## 2022-03-19 DIAGNOSIS — Z5181 Encounter for therapeutic drug level monitoring: Secondary | ICD-10-CM | POA: Insufficient documentation

## 2022-03-19 DIAGNOSIS — I480 Paroxysmal atrial fibrillation: Secondary | ICD-10-CM | POA: Insufficient documentation

## 2022-03-19 LAB — POCT INR: POC INR: 2.5

## 2022-03-19 NOTE — Patient Instructions (Signed)
Description   Continue warfarin to 1 1/2 tablets daily except 2 tablets on Thursdays Recheck in 4 wks

## 2022-03-27 DIAGNOSIS — Z20828 Contact with and (suspected) exposure to other viral communicable diseases: Secondary | ICD-10-CM | POA: Diagnosis not present

## 2022-03-27 DIAGNOSIS — H6123 Impacted cerumen, bilateral: Secondary | ICD-10-CM | POA: Diagnosis not present

## 2022-03-27 DIAGNOSIS — B9789 Other viral agents as the cause of diseases classified elsewhere: Secondary | ICD-10-CM | POA: Diagnosis not present

## 2022-03-27 DIAGNOSIS — Z6835 Body mass index (BMI) 35.0-35.9, adult: Secondary | ICD-10-CM | POA: Diagnosis not present

## 2022-03-27 DIAGNOSIS — E6609 Other obesity due to excess calories: Secondary | ICD-10-CM | POA: Diagnosis not present

## 2022-03-27 DIAGNOSIS — J988 Other specified respiratory disorders: Secondary | ICD-10-CM | POA: Diagnosis not present

## 2022-04-15 ENCOUNTER — Other Ambulatory Visit: Payer: Self-pay | Admitting: Interventional Radiology

## 2022-04-15 ENCOUNTER — Other Ambulatory Visit: Payer: Self-pay | Admitting: *Deleted

## 2022-04-15 DIAGNOSIS — C642 Malignant neoplasm of left kidney, except renal pelvis: Secondary | ICD-10-CM

## 2022-04-16 ENCOUNTER — Ambulatory Visit: Payer: Medicare Other | Attending: Cardiology | Admitting: *Deleted

## 2022-04-16 DIAGNOSIS — I480 Paroxysmal atrial fibrillation: Secondary | ICD-10-CM | POA: Insufficient documentation

## 2022-04-16 DIAGNOSIS — Z5181 Encounter for therapeutic drug level monitoring: Secondary | ICD-10-CM | POA: Insufficient documentation

## 2022-04-16 LAB — POCT INR: INR: 1.9 — AB (ref 2.0–3.0)

## 2022-04-16 NOTE — Patient Instructions (Signed)
Take warfarin 2 tablets tonight and tomorrow night then resume 1 1/2 tablets daily except 2 tablets on Thursdays Recheck in 4 wks

## 2022-05-13 ENCOUNTER — Ambulatory Visit (HOSPITAL_COMMUNITY)
Admission: RE | Admit: 2022-05-13 | Discharge: 2022-05-13 | Disposition: A | Discharge status: Home / Self Care | Payer: Medicare Other | Source: Ambulatory Visit | Attending: Hematology | Admitting: Hematology

## 2022-05-13 DIAGNOSIS — C649 Malignant neoplasm of unspecified kidney, except renal pelvis: Secondary | ICD-10-CM | POA: Diagnosis not present

## 2022-05-13 DIAGNOSIS — C180 Malignant neoplasm of cecum: Secondary | ICD-10-CM | POA: Insufficient documentation

## 2022-05-13 DIAGNOSIS — K5730 Diverticulosis of large intestine without perforation or abscess without bleeding: Secondary | ICD-10-CM | POA: Diagnosis not present

## 2022-05-13 MED ORDER — GADOBUTROL 1 MMOL/ML IV SOLN
10.0000 mL | Freq: Once | INTRAVENOUS | Status: AC | PRN
  Administered 2022-05-13: 10 mL via INTRAVENOUS

## 2022-05-17 NOTE — Progress Notes (Signed)
[facility] [address], [place] [address]    Clinic Day:  05/18/2022  Referring physician: Assunta Found, MD  Patient Care Team: Assunta Found, MD as PCP - General (Family Medicine) Wyline Mood Dorothe Pea, MD as PCP - Cardiology (Cardiology) Lanier Prude, MD as PCP - Electrophysiology (Cardiology) Doreatha Massed, MD as Medical Oncologist (Medical Oncology) Therese Sarah, RN as Oncology Nurse Navigator (Medical Oncology)   ASSESSMENT & PLAN:   Assessment: 1.  Stage I (T1 N0 M0) cecal adenocarcinoma: - Colonoscopy (07/25/2021): 30 mm polyp in the cecum, in proximity to the appendiceal orifice with no infiltration. Polyp was carpet like. Positive for superficial focal ulceration. - Pathology: At least intramucosal adenocarcinoma/high-grade dysplasia.   - Laparoscopic right hemicolectomy on 10/09/2021 - Pathology: Well-differentiated invasive adenocarcinoma, margins negative, 0/25 lymph nodes involved, PT1PN0   2.  Left kidney clear-cell renal cell carcinoma: - Left partial nephrectomy on 08/21/2016 - Clear-cell renal cell carcinoma, Fuhrman grade 3, spanning 12.5 cm. Resection margins focally positive. Tumor limited to kidney. PT1BPNX. - MRI of the abdomen (04/25/2021): Postoperative changes of prior partial left lower pole nephrectomy with enhancing soft tissue nodularity in the surgical bed measuring 10 x 6 mm concerning for local recurrence. - Cryoablation of left RCC recurrence on 08/06/2021   3.  History of endometrial cancer: - Initially diagnosed in 2001, stage I, TAH and BSO. - Appendiceal recurrence in 2008, status postresection followed by XRT for 6 weeks.   4.  Social/family history: - She lives by herself and is independent of ADLs and IADLs. - She is currently working as a Tree surgeon. - She quit smoking in 2000, smoked 1 pack/day for 20 years. - Father had prostate cancer. Mother had breast cancer. Maternal aunt had breast  cancer. Paternal first cousin had thyroid cancer. Maternal grandmother had liver cancer. Another paternal first cousin died of cancer.     Plan: 1.  Stage I (T1 N0 M0) cecal adenocarcinoma, MMR preserved: - She denies any change in bowel habits.  No bleeding per rectum or melena. - Reviewed MRI of the abdomen and pelvis from 05/13/2022: No evidence of recurrence or metastatic disease.  Descending and sigmoid colon diverticulosis. - Recommend checking CBC, CMP and a CEA level today. - RTC 6 months with repeat CEA and scans.  She reports that she had thrown up on the table last 3 times when she was given IV CT dye.  Hence we will repeat MRI at next visit.   2.  Left kidney clear-cell renal cell carcinoma: - Reviewed MRI abdomen from 05/13/2022: Status post inferior pole ablation of the left kidney without suspicious soft tissue or contrast-enhancement to suggest local recurrence. - Plan to repeat scan in 6 months.  Orders Placed This Encounter  Procedures   MR ABDOMEN W WO CONTRAST    Standing Status:   Future    Standing Expiration Date:   05/18/2023    Order Specific Question:   If indicated for the ordered procedure, I authorize the administration of contrast media per Radiology protocol    Answer:   Yes    Order Specific Question:   What is the patient's sedation requirement?    Answer:   No Sedation    Order Specific Question:   Does the patient have a pacemaker or implanted devices?    Answer:   No    Order Specific Question:   Preferred imaging location?    Answer:   [facility] (table  limit - 550lbs)   MR PELVIS W WO CONTRAST    Standing Status:   Future    Standing Expiration Date:   05/18/2023    Order Specific Question:   If indicated for the ordered procedure, I authorize the administration of contrast media per Radiology protocol    Answer:   Yes    Order Specific Question:   What is the patient's sedation requirement?    Answer:   No Sedation    Order Specific  Question:   Does the patient have a pacemaker or implanted devices?    Answer:   No    Order Specific Question:   Preferred imaging location?    Answer:   [facility] (table limit [phone])   CEA    Standing Status:   Future    Standing Expiration Date:   05/18/2023   Comprehensive metabolic panel    Standing Status:   Future    Standing Expiration Date:   05/18/2023   CBC with Differential    Standing Status:   Future    Standing Expiration Date:   05/18/2023      I,Katie Daubenspeck,acting as a scribe for Doreatha Massed, MD.,have documented all relevant documentation on the behalf of Doreatha Massed, MD,as directed by  Doreatha Massed, MD while in the presence of Doreatha Massed, MD.   I, Doreatha Massed MD, have reviewed the above documentation for accuracy and completeness, and I agree with the above.   Doreatha Massed, MD   4/29/20241:00 PM  CHIEF COMPLAINT:   Diagnosis: stage I cecal cancer and left kidney RCC    Cancer Staging  Cecal cancer [facility]) Staging form: Colon and Rectum, AJCC 8th Edition - Clinical stage from 11/17/2021: Stage I (cT1, cN0, cM0) - Unsigned    Prior Therapy: Right hemicolectomy on 10/09/2021   Current Therapy:  Surveillance    HISTORY OF PRESENT ILLNESS:   Oncology History   No history exists.     INTERVAL HISTORY:   Melissa Velazquez is a 72 y.o. female presenting to clinic today for follow up of stage I cecal cancer and left kidney RCC. She was last seen by me on 11/17/21.  Since her last visit, she underwent surveillance MRI A/P on 05/13/22 showing: no evidence of local recurrence in left kidney, lymphadenopathy, or metastatic disease.  Today, she states that she is doing well overall. Her appetite level is at 100%. Her energy level is at 75%.  PAST MEDICAL HISTORY:   Past Medical History: Past Medical History:  Diagnosis Date   Allergic rhinitis    Anal fissure    Hx of    Anxiety    hx of   Arthritis     Asthma    none in last year seasonal   Chronic kidney disease    partial nephrectomy   Depression    hx of   Diverticular disease    Dysrhythmia    a_fib   Eczema    Endometrial cancer (HCC) 2001   spread to appendix    Hemorrhoids    Hyperglycemia    Hypertension    Hypothyroidism    Kidney cancer, primary, with metastasis from kidney to other site [facility])    Obesity    Sebaceous cyst    Thyroid disease    Thyroid nodule    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    Surgical History: Past Surgical History:  Procedure Laterality Date   APPENDECTOMY  01/19/2006   BIOPSY  12/14/2016  Procedure: BIOPSY;  Surgeon: Malissa Hippo, MD;  [facility];  Service: Endoscopy;;  colon   BIOPSY  07/25/2021   Procedure: BIOPSY;  Surgeon: Dolores Frame, MD;  [facility];  Service: Gastroenterology;;   CHOLECYSTECTOMY  01/19/2006   COLONOSCOPY N/A 11/24/2012   Procedure: COLONOSCOPY;  Surgeon: Malissa Hippo, MD;  [facility];  Service: Endoscopy;  Laterality: N/A;  830   COLONOSCOPY N/A 12/14/2016   Procedure: COLONOSCOPY;  Surgeon: Malissa Hippo, MD;  [facility];  Service: Endoscopy;  Laterality: N/A;  12:00   COLONOSCOPY  07/25/2021   Bx of polyp CA +   COLONOSCOPY WITH PROPOFOL N/A 07/25/2021   Procedure: COLONOSCOPY WITH PROPOFOL;  Surgeon: Dolores Frame, MD;  [facility];  Service: Gastroenterology;  Laterality: N/A;  115 ASA 1   CYSTOSCOPY WITH STENT PLACEMENT N/A 10/09/2021   Procedure: CYSTOSCOPY WITH STENT PLACEMENT;  Surgeon: Crist Fat, MD;  Location: [facility];  Service: Urology;  Laterality: N/A;  1. Cystoscopy, retrograde pyelogram with interpretation 2. Bilateral temporary ureteral stent placement   IR RADIOLOGIST EVAL & MGMT  06/23/2021   IR RADIOLOGIST EVAL & MGMT  09/05/2021   LAPAROSCOPIC RIGHT HEMI COLECTOMY Right 10/09/2021   Procedure: LAPAROSCOPIC RIGHT HEMI  COLECTOMY;  Surgeon: Andria Meuse, MD;  Location: [facility];  Service: General;  Laterality: Right;   POLYPECTOMY  12/14/2016   Procedure: POLYPECTOMY;  Surgeon: Malissa Hippo, MD;  [facility];  Service: Endoscopy;;  colon   POLYPECTOMY  07/25/2021   Procedure: POLYPECTOMY;  Surgeon: Dolores Frame, MD;  [facility];  Service: Gastroenterology;;   RADIOLOGY WITH ANESTHESIA N/A 08/06/2021   Procedure: IR WITH ANESTHESIA CRYOABLATION;  Surgeon: Irish Lack, MD;  [facility]: [facility];  Service: Radiology;  Laterality: N/A;   ROBOTIC ASSITED PARTIAL NEPHRECTOMY Left 08/21/2016   Procedure: XI ROBOTIC ASSITED PARTIAL NEPHRECTOMY;  Surgeon: Crist Fat, MD;  [facility]: [facility];  Service: Urology;  Laterality: Left;   TOTAL ABDOMINAL HYSTERECTOMY  01/20/1999   TOTAL KNEE ARTHROPLASTY Bilateral 01/24/2020   Procedure: TOTAL KNEE BILATERAL;  Surgeon: Ollen Gross, MD;  Location: [facility];  Service: Orthopedics;  Laterality: Bilateral;   WISDOM TOOTH EXTRACTION      Social History: Social History   Socioeconomic History   Marital status: Single    Spouse name: Not on file   Number of children: 0   Years of education: Not on file   Highest education level: Not on file  Occupational History   Occupation: Psychologist  Tobacco Use   Smoking status: Former    Packs/day: 1.00    Years: 20.00    Additional pack years: 0.00    Total pack years: 20.00    Types: Cigarettes    Quit date: 01/19/1998    Years since quitting: 24.3    Passive exposure: Past   Smokeless tobacco: Never  Vaping Use   Vaping Use: Never used  Substance and Sexual Activity   Alcohol use: Yes    Alcohol/week: 0.0 standard drinks of alcohol    Comment: rare   Drug use: No   Sexual activity: Not Currently  Other Topics Concern   Not on file  Social History Narrative   2 cups of caffeine daily    Social Determinants of Health   Financial Resource Strain: Not on file   Food Insecurity: No Food Insecurity (10/09/2021)   Hunger Vital Sign  Worried About Programme researcher, broadcasting/film/video in the Last Year: Never true    Ran Out of Food in the Last Year: Never true  Transportation Needs: No Transportation Needs (10/09/2021)   PRAPARE - Administrator, Civil Service (Medical): No    Lack of Transportation (Non-Medical): No  Physical Activity: Not on file  Stress: Not on file  Social Connections: Not on file  Intimate Partner Violence: Not At Risk (10/09/2021)   Humiliation, Afraid, Rape, and Kick questionnaire    Fear of Current or Ex-Partner: No    Emotionally Abused: No    Physically Abused: No    Sexually Abused: No    Family History: Family History  Problem Relation Age of Onset   Breast cancer Mother    Diabetes Mother    Obesity Sister    Asthma Sister    Eczema Sister    COPD Father        smoked   Prostate cancer Father    Breast cancer Maternal Aunt    Urticaria Maternal Grandfather    Colon cancer Neg Hx     Current Medications:  Current Outpatient Medications:    albuterol (PROVENTIL HFA;VENTOLIN HFA) 108 (90 Base) MCG/ACT inhaler, Inhale 2 puffs into the lungs every 6 (six) hours as needed for wheezing or shortness of breath., Disp: , Rfl:    cetirizine (ZYRTEC) 10 MG tablet, Take 10 mg by mouth daily as needed for allergies., Disp: , Rfl:    Cholecalciferol (VITAMIN D3) 125 MCG (5000 UT) CAPS, Take 5,000 Units by mouth daily., Disp: , Rfl:    fluconazole (DIFLUCAN) 150 MG tablet, Take 150 mg by mouth See admin instructions. Take 150 mg every 4 days as needed for lichen planus or thrush, Disp: , Rfl:    fluticasone (FLONASE) 50 MCG/ACT nasal spray, Place 1 spray into both nostrils 2 (two) times daily as needed for allergies or rhinitis., Disp: 16 g, Rfl: 5   hydrocortisone (ANUSOL-HC) 2.5 % rectal cream, Apply 1 Application topically 4 (four) times daily as needed for hemorrhoids., Disp: , Rfl:    levothyroxine (SYNTHROID,  LEVOTHROID) 75 MCG tablet, Take 75 mcg by mouth daily before breakfast., Disp: , Rfl:    magic mouthwash SOLN, Take 5 mLs by mouth 2 (two) times daily as needed (Thrush / lichen planus). Swish and spit, Disp: , Rfl:    meclizine (ANTIVERT) 25 MG tablet, Take 25 mg by mouth every 6 (six) hours as needed., Disp: , Rfl:    metoprolol tartrate (LOPRESSOR) 25 MG tablet, Take 0.5 tablets (12.5 mg total) by mouth as needed (for palpitations)., Disp: 45 tablet, Rfl: 3   ondansetron (ZOFRAN) 8 MG tablet, Take 8 mg by mouth every 8 (eight) hours as needed., Disp: , Rfl:    Polyethyl Glycol-Propyl Glycol (SYSTANE OP), Place 2-3 drops into both eyes 4 (four) times daily as needed (itching / burning)., Disp: , Rfl:    sertraline (ZOLOFT) 100 MG tablet, Take 150 mg by mouth daily with breakfast., Disp: , Rfl:    simethicone (MYLICON) 125 MG chewable tablet, Chew 125 mg by mouth every 6 (six) hours as needed for flatulence., Disp: , Rfl:    triamcinolone ointment (KENALOG) 0.5 %, Apply 1 Application topically 2 (two) times daily as needed (itching)., Disp: , Rfl:    warfarin (COUMADIN) 5 MG tablet, TAKE 1 AND 1/2 TO 2 TABLETS BY MOUTH DAILY AS DIRECTED BY COUMADIN CLINIC, Disp: 180 tablet, Rfl: 1   Allergies: Allergies  Allergen Reactions   Triple Antibiotic [Bacitracin-Neomycin-Polymyxin] Anaphylaxis   Levaquin [Levofloxacin] Other (See Comments)    Stomach upset   Oxycodone     Tremors and brain fog   Capsaicin Rash   Elastic Bandages & [Zinc] Rash   Nickel Itching and Rash    REVIEW OF SYSTEMS:   Review of Systems  Constitutional:  Negative for chills, fatigue and fever.  HENT:   Negative for lump/mass, mouth sores, nosebleeds, sore throat and trouble swallowing.   Eyes:  Negative for eye problems.  Respiratory:  Negative for cough and shortness of breath.   Cardiovascular:  Positive for palpitations. Negative for chest pain and leg swelling.  Gastrointestinal:  Negative for abdominal pain,  constipation, diarrhea, nausea and vomiting.  Genitourinary:  Negative for bladder incontinence, difficulty urinating, dysuria, frequency, hematuria and nocturia.   Musculoskeletal:  Negative for arthralgias, back pain, flank pain, myalgias and neck pain.  Skin:  Negative for itching and rash.  Neurological:  Positive for dizziness. Negative for headaches and numbness.  Hematological:  Does not bruise/bleed easily.  Psychiatric/Behavioral:  Negative for depression, sleep disturbance and suicidal ideas. The patient is not nervous/anxious.   All other systems reviewed and are negative.    VITALS:   Blood pressure (!) 124/90, pulse 77, temperature 98.1 F (36.7 C), temperature source Tympanic, resp. rate 18, weight 212 lb 11.2 oz (96.5 kg), SpO2 96 %.  Wt Readings from Last 3 Encounters:  05/18/22 212 lb 11.2 oz (96.5 kg)  11/17/21 198 lb 8 oz (90 kg)  10/21/21 208 lb 5.4 oz (94.5 kg)    Body mass index is 36.51 kg/m.  Performance status (ECOG): 1 - Symptomatic but completely ambulatory  PHYSICAL EXAM:   Physical Exam Vitals and nursing note reviewed. Exam conducted with a chaperone present.  Constitutional:      Appearance: Normal appearance.  Cardiovascular:     Rate and Rhythm: Normal rate and regular rhythm.     Pulses: Normal pulses.     Heart sounds: Normal heart sounds.  Pulmonary:     Effort: Pulmonary effort is normal.     Breath sounds: Normal breath sounds.  Abdominal:     Palpations: Abdomen is soft. There is no hepatomegaly, splenomegaly or mass.     Tenderness: There is no abdominal tenderness.  Musculoskeletal:     Right lower leg: No edema.     Left lower leg: No edema.  Lymphadenopathy:     Cervical: No cervical adenopathy.     Right cervical: No superficial, deep or posterior cervical adenopathy.    Left cervical: No superficial, deep or posterior cervical adenopathy.     Upper Body:     Right upper body: No supraclavicular or axillary adenopathy.      Left upper body: No supraclavicular or axillary adenopathy.  Neurological:     General: No focal deficit present.     Mental Status: She is alert and oriented to person, place, and time.  Psychiatric:        Mood and Affect: Mood normal.        Behavior: Behavior normal.     LABS:      Latest Ref Rng & Units 10/12/2021    1:31 PM 10/12/2021    4:39 AM 10/11/2021    4:56 AM  CBC  WBC 4.0 - 10.5 K/uL 6.1  5.6  6.5   Hemoglobin 12.0 - 15.0 g/dL 16.1  09.6  04.5   Hematocrit 36.0 - 46.0 % 31.7  32.6  35.8   Platelets 150 - 400 K/uL 165  141  136       Latest Ref Rng & Units 10/12/2021    4:39 AM 10/11/2021    4:56 AM 10/10/2021    4:35 AM  CMP  Glucose 70 - 99 mg/dL 97  92  161   BUN 8 - 23 mg/dL 18  13  10    Creatinine 0.44 - 1.00 mg/dL 0.96  0.45  4.09   Sodium 135 - 145 mmol/L 142  143  139   Potassium 3.5 - 5.1 mmol/L 4.1  4.2  4.1   Chloride 98 - 111 mmol/L 107  108  106   CO2 22 - 32 mmol/L 29  30  27    Calcium 8.9 - 10.3 mg/dL 8.6  8.6  8.4      Lab Results  Component Value Date   CEA <0.5 08/18/2006   /  CEA  Date Value Ref Range Status  08/18/2006 <0.5  Final   No results found for: "PSA1" No results found for: "WJX914" No results found for: "CAN125"  No results found for: "TOTALPROTELP", "ALBUMINELP", "A1GS", "A2GS", "BETS", "BETA2SER", "GAMS", "MSPIKE", "SPEI" No results found for: "TIBC", "FERRITIN", "IRONPCTSAT" No results found for: "LDH"   STUDIES:   MR Abdomen W Wo Contrast  Result Date: 05/15/2022 CLINICAL DATA:  Kidney cancer status post left renal ablation, cecal colon cancer surveillance EXAM: MRI ABDOMEN AND PELVIS WITHOUT AND WITH CONTRAST TECHNIQUE: Multiplanar multisequence MR imaging of the abdomen and pelvis was performed both before and after the administration of intravenous contrast. CONTRAST:  10mL GADAVIST GADOBUTROL 1 MMOL/ML IV SOLN COMPARISON:  CT chest abdomen pelvis, 08/13/2021 FINDINGS: COMBINED FINDINGS FOR BOTH MR ABDOMEN AND  PELVIS Lower chest: No acute abnormality. Hepatobiliary: No focal liver abnormality is seen. Status post cholecystectomy. Unchanged postoperative biliary dilatation. Pancreas: Unremarkable. No pancreatic ductal dilatation or surrounding inflammatory changes. Spleen: Normal in size without significant abnormality. Adrenals/Urinary Tract: Adrenal glands are unremarkable. Status post inferior pole ablation of the left kidney without suspicious soft tissue or contrast enhancement to suggest local recurrence (series 15, image 54, series 6, image 17). The right kidney is normal, without renal calculi, solid lesion, or hydronephrosis. Bladder is unremarkable. Stomach/Bowel: Stomach is within normal limits. Appendix not clearly visualized and may be surgically absent. No evidence of bowel wall thickening, distention, or inflammatory changes. Specifically, no visualized mass or other abnormality of the cecum. Descending and sigmoid diverticulosis. Vascular/Lymphatic: No significant vascular findings are present. No enlarged abdominal or pelvic lymph nodes. Reproductive: Status post hysterectomy. Other: No abdominal wall hernia or abnormality. No ascites. Musculoskeletal: No acute or significant osseous findings. IMPRESSION: 1. Status post inferior pole ablation of the left kidney without suspicious soft tissue or contrast enhancement to suggest local recurrence. 2. No evidence of lymphadenopathy or metastatic disease in the abdomen or pelvis. 3. Descending and sigmoid diverticulosis. 4. Status post hysterectomy and cholecystectomy. Electronically Signed   By: Jearld Lesch M.D.   On: 05/15/2022 11:47   MR PELVIS W WO CONTRAST  Result Date: 05/15/2022 CLINICAL DATA:  Kidney cancer status post left renal ablation, cecal colon cancer surveillance EXAM: MRI ABDOMEN AND PELVIS WITHOUT AND WITH CONTRAST TECHNIQUE: Multiplanar multisequence MR imaging of the abdomen and pelvis was performed both before and after the  administration of intravenous contrast. CONTRAST:  10mL GADAVIST GADOBUTROL 1 MMOL/ML IV SOLN COMPARISON:  CT chest abdomen pelvis, 08/13/2021 FINDINGS: COMBINED FINDINGS FOR BOTH MR ABDOMEN AND PELVIS Lower  chest: No acute abnormality. Hepatobiliary: No focal liver abnormality is seen. Status post cholecystectomy. Unchanged postoperative biliary dilatation. Pancreas: Unremarkable. No pancreatic ductal dilatation or surrounding inflammatory changes. Spleen: Normal in size without significant abnormality. Adrenals/Urinary Tract: Adrenal glands are unremarkable. Status post inferior pole ablation of the left kidney without suspicious soft tissue or contrast enhancement to suggest local recurrence (series 15, image 54, series 6, image 17). The right kidney is normal, without renal calculi, solid lesion, or hydronephrosis. Bladder is unremarkable. Stomach/Bowel: Stomach is within normal limits. Appendix not clearly visualized and may be surgically absent. No evidence of bowel wall thickening, distention, or inflammatory changes. Specifically, no visualized mass or other abnormality of the cecum. Descending and sigmoid diverticulosis. Vascular/Lymphatic: No significant vascular findings are present. No enlarged abdominal or pelvic lymph nodes. Reproductive: Status post hysterectomy. Other: No abdominal wall hernia or abnormality. No ascites. Musculoskeletal: No acute or significant osseous findings. IMPRESSION: 1. Status post inferior pole ablation of the left kidney without suspicious soft tissue or contrast enhancement to suggest local recurrence. 2. No evidence of lymphadenopathy or metastatic disease in the abdomen or pelvis. 3. Descending and sigmoid diverticulosis. 4. Status post hysterectomy and cholecystectomy. Electronically Signed   By: Jearld Lesch M.D.   On: 05/15/2022 11:47

## 2022-05-18 ENCOUNTER — Ambulatory Visit (INDEPENDENT_AMBULATORY_CARE_PROVIDER_SITE_OTHER): Payer: Medicare Other | Admitting: *Deleted

## 2022-05-18 ENCOUNTER — Inpatient Hospital Stay: Payer: Medicare Other | Attending: Hematology | Admitting: Hematology

## 2022-05-18 VITALS — BP 124/90 | HR 77 | Temp 98.1°F | Resp 18 | Wt 212.7 lb

## 2022-05-18 DIAGNOSIS — C180 Malignant neoplasm of cecum: Secondary | ICD-10-CM

## 2022-05-18 DIAGNOSIS — I480 Paroxysmal atrial fibrillation: Secondary | ICD-10-CM

## 2022-05-18 DIAGNOSIS — Z5181 Encounter for therapeutic drug level monitoring: Secondary | ICD-10-CM | POA: Insufficient documentation

## 2022-05-18 DIAGNOSIS — Z85038 Personal history of other malignant neoplasm of large intestine: Secondary | ICD-10-CM | POA: Insufficient documentation

## 2022-05-18 DIAGNOSIS — Z08 Encounter for follow-up examination after completed treatment for malignant neoplasm: Secondary | ICD-10-CM | POA: Insufficient documentation

## 2022-05-18 DIAGNOSIS — Z8542 Personal history of malignant neoplasm of other parts of uterus: Secondary | ICD-10-CM | POA: Insufficient documentation

## 2022-05-18 DIAGNOSIS — Z85528 Personal history of other malignant neoplasm of kidney: Secondary | ICD-10-CM | POA: Diagnosis not present

## 2022-05-18 LAB — POCT INR: INR: 2.4 (ref 2.0–3.0)

## 2022-05-18 NOTE — Patient Instructions (Signed)
[facility] Discharge Instructions   You were seen and examined today by Dr. Ellin Saba.  He reviewed the results of your MRI which was normal/stable.   We will draw lab work today.   We will see you back in 6 months. We will repeat an MRI and lab work prior to your next visit.    Thank you for choosing [facility] at [facility] to provide your oncology and hematology care.  To afford each patient quality time with our provider, please arrive at least 15 minutes before your scheduled appointment time.   If you have a lab appointment with the Cancer Center please come in thru the Main Entrance and check in at the main information desk.  You need to re-schedule your appointment should you arrive 10 or more minutes late.  We strive to give you quality time with our providers, and arriving late affects you and other patients whose appointments are after yours.  Also, if you no show three or more times for appointments you may be dismissed from the clinic at the providers discretion.     Again, thank you for choosing [facility].  Our hope is that these requests will decrease the amount of time that you wait before being seen by our physicians.       _____________________________________________________________  Should you have questions after your visit to [facility], please contact our office at [phone] and follow the prompts.  Our office hours are 8:00 a.m. and 4:30 p.m. Monday - Friday.  Please note that voicemails left after 4:00 p.m. may not be returned until the following business day.  We are closed weekends and major holidays.  You do have access to a nurse 24-7, just call the main number to the clinic [phone] and do not press any options, hold on the line and a nurse will answer the phone.    For prescription refill requests, have your pharmacy contact our office and allow 72 hours.    Due  to Covid, you will need to wear a mask upon entering the hospital. If you do not have a mask, a mask will be given to you at the Main Entrance upon arrival. For doctor visits, patients may have 1 support person age 24 or older with them. For treatment visits, patients can not have anyone with them due to social distancing guidelines and our immunocompromised population.

## 2022-05-18 NOTE — Patient Instructions (Signed)
Continue warfarin 1 1/2 tablets daily except 2 tablets on Thursdays Recheck in 4 wks

## 2022-05-20 ENCOUNTER — Ambulatory Visit
Admission: RE | Admit: 2022-05-20 | Discharge: 2022-05-20 | Disposition: A | Discharge status: Home / Self Care | Payer: Medicare Other | Source: Ambulatory Visit | Attending: Interventional Radiology | Admitting: Interventional Radiology

## 2022-05-20 DIAGNOSIS — C642 Malignant neoplasm of left kidney, except renal pelvis: Secondary | ICD-10-CM | POA: Diagnosis not present

## 2022-05-20 NOTE — Progress Notes (Signed)
Chief Complaint: Patient was consulted remotely today (TeleHealth) for follow up after left renal cryoablation of a renal carcinoma recurrence on 08/06/2021.   History of Present Illness: Melissa Velazquez is a 72 y.o. female with a history of partial nephrectomy for a left lower pole clear cell renal carcinoma in 2018. Marginal recurrence was noted by follow-up imaging measuring approximately 12 mm. Due to close proximity of the recurrence to the ureteropelvic junction and proximal ureter, a ureteral stent was placed prior to ablation on 08/01/2021 by Dr. Marlou Porch. Cryoablation was successfully performed on 08/06/21. She tolerated the procedure well with a UTI one week following ablation that was treated with antibiotics. The ureteral stent was removed 3 weeks after ablation.  Melissa Velazquez underwent laparoscopic right hemicolectomy by Dr. Cliffton Asters on 10/09/2021 for a cecal carcinoma with pathology demonstrating a well differentiated invasive adenocarcinoma with negative margins and 25 negative lymph nodes. Both her renal and colon cancers are being followed by Dr. Ellin Saba.     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    Past Surgical History:  Procedure Laterality Date   APPENDECTOMY  01/19/2006   BIOPSY  12/14/2016   Procedure: BIOPSY;  Surgeon: Malissa Hippo, MD;  [facility];  Service: Endoscopy;;  colon    BIOPSY  07/25/2021   Procedure: BIOPSY;  Surgeon: Dolores Frame, MD;  [facility]: [facility];  Service: Gastroenterology;;   CHOLECYSTECTOMY  01/19/2006   COLONOSCOPY N/A 11/24/2012   Procedure: COLONOSCOPY;  Surgeon: Malissa Hippo, MD;  [facility];  Service: Endoscopy;  Laterality: N/A;  830   COLONOSCOPY N/A 12/14/2016   Procedure: COLONOSCOPY;  Surgeon: Malissa Hippo, MD;  [facility];  Service: Endoscopy;  Laterality: N/A;  12:00   COLONOSCOPY  07/25/2021   Bx of polyp CA +   COLONOSCOPY WITH PROPOFOL N/A 07/25/2021   Procedure: COLONOSCOPY WITH PROPOFOL;  Surgeon: Dolores Frame, MD;  [facility];  Service: Gastroenterology;  Laterality: N/A;  115 ASA 1   CYSTOSCOPY WITH STENT PLACEMENT N/A 10/09/2021   Procedure: CYSTOSCOPY WITH STENT PLACEMENT;  Surgeon: Crist Fat, MD;  Location: [facility];  Service: Urology;  Laterality: N/A;  1. Cystoscopy, retrograde pyelogram with interpretation 2. Bilateral temporary ureteral stent placement   IR RADIOLOGIST EVAL & MGMT  06/23/2021   IR RADIOLOGIST EVAL & MGMT  09/05/2021   LAPAROSCOPIC RIGHT HEMI COLECTOMY Right 10/09/2021   Procedure: LAPAROSCOPIC RIGHT HEMI COLECTOMY;  Surgeon: Andria Meuse, MD;  Location: [facility];  Service: General;  Laterality: Right;   POLYPECTOMY  12/14/2016   Procedure: POLYPECTOMY;  Surgeon: Malissa Hippo, MD;  [facility];  Service: Endoscopy;;  colon   POLYPECTOMY  07/25/2021   Procedure: POLYPECTOMY;  Surgeon: Dolores Frame, MD;  [facility];  Service: Gastroenterology;;   RADIOLOGY WITH ANESTHESIA N/A 08/06/2021   Procedure: IR WITH ANESTHESIA CRYOABLATION;  Surgeon: Irish Lack, MD;  Location: [facility];  Service: Radiology;  Laterality: N/A;   ROBOTIC ASSITED PARTIAL NEPHRECTOMY Left 08/21/2016   Procedure: XI ROBOTIC ASSITED PARTIAL NEPHRECTOMY;  Surgeon: Crist Fat, MD;  Location: [facility];   Service: Urology;  Laterality: Left;   TOTAL ABDOMINAL HYSTERECTOMY  01/20/1999   TOTAL KNEE ARTHROPLASTY Bilateral 01/24/2020   Procedure: TOTAL KNEE BILATERAL;  Surgeon: Ollen Gross, MD;  Location: [facility];  Service: Orthopedics;  Laterality: Bilateral;   WISDOM TOOTH EXTRACTION      Allergies: Triple antibiotic [bacitracin-neomycin-polymyxin], Levaquin [levofloxacin], Oxycodone, Capsaicin, Elastic bandages & [zinc], and Nickel  Medications: Prior to Admission medications   Medication Sig Start Date End Date Taking? Authorizing Provider  albuterol (PROVENTIL HFA;VENTOLIN HFA) 108 (90 Base) MCG/ACT inhaler Inhale 2 puffs into the lungs every 6 (six) hours as needed for wheezing or shortness of breath.    [provider]  cetirizine (ZYRTEC) 10 MG tablet Take 10 mg by mouth daily as needed for allergies.    [provider]  Cholecalciferol (VITAMIN D3) 125 MCG (5000 UT) CAPS Take 5,000 Units by mouth daily.    [provider]  fluconazole (DIFLUCAN) 150 MG tablet Take 150 mg by mouth See admin instructions. Take 150 mg every 4 days as needed for lichen planus or thrush    [provider]  fluticasone (FLONASE) 50 MCG/ACT nasal spray Place 1 spray into both nostrils 2 (two) times daily as needed for allergies or rhinitis. 12/22/19   Alfonse Spruce, MD  hydrocortisone (ANUSOL-HC) 2.5 % rectal cream Apply 1 Application topically 4 (four) times daily as needed for hemorrhoids. 05/27/21   [provider]  levothyroxine (SYNTHROID, LEVOTHROID) 75 MCG tablet Take 75 mcg by mouth daily before breakfast.    [provider]  magic mouthwash SOLN Take 5 mLs by mouth 2 (two) times daily as needed (Thrush / lichen planus). Swish and spit    [provider]  meclizine (ANTIVERT) 25 MG tablet Take 25 mg by mouth every 6 (six) hours as needed. 11/12/21   [provider]  metoprolol tartrate (LOPRESSOR) 25 MG tablet Take 0.5 tablets  (12.5 mg total) by mouth as needed (for palpitations). 02/20/20   Tereso Newcomer T, PA-C  ondansetron (ZOFRAN) 8 MG tablet Take 8 mg by mouth every 8 (eight) hours as needed. 04/19/21   [provider]  Polyethyl Glycol-Propyl Glycol (SYSTANE OP) Place 2-3 drops into both eyes 4 (four) times daily as needed (itching / burning).    [provider]  sertraline (ZOLOFT) 100 MG tablet Take 150 mg by mouth daily with breakfast.    [provider]  simethicone (MYLICON) 125 MG chewable tablet Chew 125 mg by mouth every 6 (six) hours as needed for flatulence.    [provider]  triamcinolone ointment (KENALOG) 0.5 % Apply 1 Application topically 2 (two) times daily as needed (itching).    [provider]  warfarin (COUMADIN) 5 MG tablet TAKE 1 AND 1/2 TO 2 TABLETS BY MOUTH DAILY AS DIRECTED BY COUMADIN CLINIC 12/17/21   Antoine Poche, MD     Family History  Problem Relation Age of Onset   Breast cancer Mother  Diabetes Mother    Obesity Sister    Asthma Sister    Eczema Sister    COPD Father        smoked   Prostate cancer Father    Breast cancer Maternal Aunt    Urticaria Maternal Grandfather    Colon cancer Neg Hx     Social History   Socioeconomic History   Marital status: Single    Spouse name: Not on file   Number of children: 0   Years of education: Not on file   Highest education level: Not on file  Occupational History   Occupation: Psychologist  Tobacco Use   Smoking status: Former    Packs/day: 1.00    Years: 20.00    Additional pack years: 0.00    Total pack years: 20.00    Types: Cigarettes    Quit date: 01/19/1998    Years since quitting: 24.3    Passive exposure: Past   Smokeless tobacco: Never  Vaping Use   Vaping Use: Never used  Substance and Sexual Activity   Alcohol use: Yes    Alcohol/week: 0.0 standard drinks of alcohol    Comment: rare   Drug use: No   Sexual activity: Not Currently  Other Topics Concern    Not on file  Social History Narrative   2 cups of caffeine daily    Social Determinants of Health   Financial Resource Strain: Not on file  Food Insecurity: No Food Insecurity (10/09/2021)   Hunger Vital Sign    Worried About Running Out of Food in the Last Year: Never true    Ran Out of Food in the Last Year: Never true  Transportation Needs: No Transportation Needs (10/09/2021)   PRAPARE - Administrator, Civil Service (Medical): No    Lack of Transportation (Non-Medical): No  Physical Activity: Not on file  Stress: Not on file  Social Connections: Not on file    ECOG Status: 0 - Asymptomatic  Review of Systems  Constitutional:  Positive for fatigue. Negative for appetite change, chills and fever.  Respiratory: Negative.    Cardiovascular: Negative.   Gastrointestinal: Negative.   Genitourinary: Negative.   Musculoskeletal: Negative.   Neurological: Negative.     Review of Systems: A 12 point ROS discussed and pertinent positives are indicated in the HPI above.  All other systems are negative.   Physical Exam No direct physical exam was performed (except for noted visual exam findings with Video Visits).   Vital Signs: There were no vitals taken for this visit.  Imaging: MR Abdomen W Wo Contrast  Result Date: 05/15/2022 CLINICAL DATA:  Kidney cancer status post left renal ablation, cecal colon cancer surveillance EXAM: MRI ABDOMEN AND PELVIS WITHOUT AND WITH CONTRAST TECHNIQUE: Multiplanar multisequence MR imaging of the abdomen and pelvis was performed both before and after the administration of intravenous contrast. CONTRAST:  10mL GADAVIST GADOBUTROL 1 MMOL/ML IV SOLN COMPARISON:  CT chest abdomen pelvis, 08/13/2021 FINDINGS: COMBINED FINDINGS FOR BOTH MR ABDOMEN AND PELVIS Lower chest: No acute abnormality. Hepatobiliary: No focal liver abnormality is seen. Status post cholecystectomy. Unchanged postoperative biliary dilatation. Pancreas: Unremarkable.  No pancreatic ductal dilatation or surrounding inflammatory changes. Spleen: Normal in size without significant abnormality. Adrenals/Urinary Tract: Adrenal glands are unremarkable. Status post inferior pole ablation of the left kidney without suspicious soft tissue or contrast enhancement to suggest local recurrence (series 15, image 54, series 6, image 17). The right kidney is normal, without renal calculi, solid  lesion, or hydronephrosis. Bladder is unremarkable. Stomach/Bowel: Stomach is within normal limits. Appendix not clearly visualized and may be surgically absent. No evidence of bowel wall thickening, distention, or inflammatory changes. Specifically, no visualized mass or other abnormality of the cecum. Descending and sigmoid diverticulosis. Vascular/Lymphatic: No significant vascular findings are present. No enlarged abdominal or pelvic lymph nodes. Reproductive: Status post hysterectomy. Other: No abdominal wall hernia or abnormality. No ascites. Musculoskeletal: No acute or significant osseous findings. IMPRESSION: 1. Status post inferior pole ablation of the left kidney without suspicious soft tissue or contrast enhancement to suggest local recurrence. 2. No evidence of lymphadenopathy or metastatic disease in the abdomen or pelvis. 3. Descending and sigmoid diverticulosis. 4. Status post hysterectomy and cholecystectomy. Electronically Signed   By: Jearld Lesch M.D.   On: 05/15/2022 11:47   MR PELVIS W WO CONTRAST  Result Date: 05/15/2022 CLINICAL DATA:  Kidney cancer status post left renal ablation, cecal colon cancer surveillance EXAM: MRI ABDOMEN AND PELVIS WITHOUT AND WITH CONTRAST TECHNIQUE: Multiplanar multisequence MR imaging of the abdomen and pelvis was performed both before and after the administration of intravenous contrast. CONTRAST:  10mL GADAVIST GADOBUTROL 1 MMOL/ML IV SOLN COMPARISON:  CT chest abdomen pelvis, 08/13/2021 FINDINGS: COMBINED FINDINGS FOR BOTH MR ABDOMEN AND PELVIS  Lower chest: No acute abnormality. Hepatobiliary: No focal liver abnormality is seen. Status post cholecystectomy. Unchanged postoperative biliary dilatation. Pancreas: Unremarkable. No pancreatic ductal dilatation or surrounding inflammatory changes. Spleen: Normal in size without significant abnormality. Adrenals/Urinary Tract: Adrenal glands are unremarkable. Status post inferior pole ablation of the left kidney without suspicious soft tissue or contrast enhancement to suggest local recurrence (series 15, image 54, series 6, image 17). The right kidney is normal, without renal calculi, solid lesion, or hydronephrosis. Bladder is unremarkable. Stomach/Bowel: Stomach is within normal limits. Appendix not clearly visualized and may be surgically absent. No evidence of bowel wall thickening, distention, or inflammatory changes. Specifically, no visualized mass or other abnormality of the cecum. Descending and sigmoid diverticulosis. Vascular/Lymphatic: No significant vascular findings are present. No enlarged abdominal or pelvic lymph nodes. Reproductive: Status post hysterectomy. Other: No abdominal wall hernia or abnormality. No ascites. Musculoskeletal: No acute or significant osseous findings. IMPRESSION: 1. Status post inferior pole ablation of the left kidney without suspicious soft tissue or contrast enhancement to suggest local recurrence. 2. No evidence of lymphadenopathy or metastatic disease in the abdomen or pelvis. 3. Descending and sigmoid diverticulosis. 4. Status post hysterectomy and cholecystectomy. Electronically Signed   By: Jearld Lesch M.D.   On: 05/15/2022 11:47    Labs:  CBC: Recent Labs    10/10/21 0435 10/11/21 0456 10/12/21 0439 10/12/21 1331  WBC 10.7* 6.5 5.6 6.1  HGB 12.5 11.5* 10.5* 10.3*  HCT 38.2 35.8* 32.6* 31.7*  PLT 152 136* 141* 165    COAGS: Recent Labs    10/09/21 1049 10/29/21 1019 02/12/22 1046 03/19/22 1032 04/16/22 1046 05/18/22 1423  INR 1.2   < >  2.0 2.5 1.9* 2.4  APTT 29  --   --   --   --   --    < > = values in this interval not displayed.    BMP: Recent Labs    10/03/21 1022 10/09/21 1604 10/10/21 0435 10/11/21 0456 10/12/21 0439  NA 139  --  139 143 142  K 4.1  --  4.1 4.2 4.1  CL 107  --  106 108 107  CO2 26  --  27 30 29   GLUCOSE  89  --  107* 92 97  BUN 19  --  10 13 18   CALCIUM 9.0  --  8.4* 8.6* 8.6*  CREATININE 0.94 0.98 0.95 0.98 0.85  GFRNONAA >60 >60 >60 >60 >60    LIVER FUNCTION TESTS: Recent Labs    10/03/21 1022  BILITOT 0.5  AST 20  ALT 18  ALKPHOS 58  PROT 6.8  ALBUMIN 3.9    Assessment and Plan:  I spoke with Melissa Velazquez over the phone and reviewed results from an MRI of the abdomen and pelvis dated 05/09/2022.  This demonstrates no evidence of recurrent carcinoma at the level of cryoablation of the left lower pole renal carcinoma recurrence.  There is no evidence of metastatic colon carcinoma in the abdomen or pelvis.  Ileocolic anastomosis appears intact.  Dr. Ellin Saba has scheduled a follow-up MRI in 6 months.  I told Melissa Velazquez that from a post renal cryoablation perspective, follow-up imaging could be performed every 12 months for a total of 5 years.  Follow-up imaging for her colon cancer may need to be slightly more frequent initially and she will discuss this with Dr. Ellin Saba.   Electronically Signed: Reola Calkins 05/20/2022, 7:58 AM    I spent a total of 15 Minutes in remote  clinical consultation, greater than 50% of which was counseling/coordinating care post cryoablation of a left renal carcinoma recurrence.    Visit type: Audio only (telephone). Audio (no video) only due to patient's lack of internet/smartphone capability. Alternative for in-person consultation at [facility], [address] [place], [place], [place]. This visit type was conducted due to national recommendations for restrictions regarding the COVID-19 Pandemic (e.g. social distancing).  This format  is felt to be most appropriate for this patient at this time.  All issues noted in this document were discussed and addressed.

## 2022-05-22 ENCOUNTER — Inpatient Hospital Stay: Payer: Medicare Other | Attending: Hematology

## 2022-05-22 DIAGNOSIS — I480 Paroxysmal atrial fibrillation: Secondary | ICD-10-CM | POA: Insufficient documentation

## 2022-05-22 DIAGNOSIS — C180 Malignant neoplasm of cecum: Secondary | ICD-10-CM

## 2022-05-22 DIAGNOSIS — Z85038 Personal history of other malignant neoplasm of large intestine: Secondary | ICD-10-CM | POA: Insufficient documentation

## 2022-05-22 LAB — CBC WITH DIFFERENTIAL/PLATELET
Abs Immature Granulocytes: 0.01 10*3/uL (ref 0.00–0.07)
Basophils Absolute: 0 10*3/uL (ref 0.0–0.1)
Basophils Relative: 1 %
Eosinophils Absolute: 0.2 10*3/uL (ref 0.0–0.5)
Eosinophils Relative: 5 %
HCT: 42.4 % (ref 36.0–46.0)
Hemoglobin: 14 g/dL (ref 12.0–15.0)
Immature Granulocytes: 0 %
Lymphocytes Relative: 26 %
Lymphs Abs: 1.4 10*3/uL (ref 0.7–4.0)
MCH: 30.6 pg (ref 26.0–34.0)
MCHC: 33 g/dL (ref 30.0–36.0)
MCV: 92.6 fL (ref 80.0–100.0)
Monocytes Absolute: 0.4 10*3/uL (ref 0.1–1.0)
Monocytes Relative: 7 %
Neutro Abs: 3.4 10*3/uL (ref 1.7–7.7)
Neutrophils Relative %: 61 %
Platelets: 190 10*3/uL (ref 150–400)
RBC: 4.58 MIL/uL (ref 3.87–5.11)
RDW: 14.1 % (ref 11.5–15.5)
WBC: 5.4 10*3/uL (ref 4.0–10.5)
nRBC: 0 % (ref 0.0–0.2)

## 2022-05-22 LAB — COMPREHENSIVE METABOLIC PANEL
ALT: 21 U/L (ref 0–44)
AST: 24 U/L (ref 15–41)
Albumin: 4.1 g/dL (ref 3.5–5.0)
Alkaline Phosphatase: 71 U/L (ref 38–126)
Anion gap: 10 (ref 5–15)
BUN: 13 mg/dL (ref 8–23)
CO2: 24 mmol/L (ref 22–32)
Calcium: 9 mg/dL (ref 8.9–10.3)
Chloride: 105 mmol/L (ref 98–111)
Creatinine, Ser: 1.03 mg/dL — ABNORMAL HIGH (ref 0.44–1.00)
GFR, Estimated: 58 mL/min — ABNORMAL LOW (ref >60)
Glucose, Bld: 121 mg/dL — ABNORMAL HIGH (ref 70–99)
Potassium: 3.6 mmol/L (ref 3.5–5.1)
Sodium: 139 mmol/L (ref 135–145)
Total Bilirubin: 0.9 mg/dL (ref 0.3–1.2)
Total Protein: 7.3 g/dL (ref 6.5–8.1)

## 2022-05-24 LAB — CEA: CEA: 1.3 ng/mL (ref 0.0–4.7)

## 2022-05-29 ENCOUNTER — Encounter: Payer: Self-pay | Admitting: Cardiology

## 2022-05-29 ENCOUNTER — Ambulatory Visit: Payer: Medicare Other | Attending: Cardiology | Admitting: Cardiology

## 2022-05-29 VITALS — BP 124/84 | HR 64 | Ht 64.0 in | Wt 210.8 lb

## 2022-05-29 DIAGNOSIS — I480 Paroxysmal atrial fibrillation: Secondary | ICD-10-CM

## 2022-05-29 NOTE — Patient Instructions (Signed)
Medication Instructions:  Your physician recommends that you continue on your current medications as directed. Please refer to the Current Medication list given to you today.  *If you need a refill on your cardiac medications before your next appointment, please call your pharmacy*   Lab Work: None If you have labs (blood work) drawn today and your tests are completely normal, you will receive your results only by: MyChart Message (if you have MyChart) OR A paper copy in the mail If you have any lab test that is abnormal or we need to change your treatment, we will call you to review the results.   Testing/Procedures: None   Follow-Up: At [facility], you and your health needs are our priority.  As part of our continuing mission to provide you with exceptional heart care, we have created designated Provider Care Teams.  These Care Teams include your primary Cardiologist (physician) and Advanced Practice Providers (APPs -  Physician Assistants and Nurse Practitioners) who all work together to provide you with the care you need, when you need it.  We recommend signing up for the patient portal called "MyChart".  Sign up information is provided on this After Visit Summary.  MyChart is used to connect with patients for Virtual Visits (Telemedicine).  Patients are able to view lab/test results, encounter notes, upcoming appointments, etc.  Non-urgent messages can be sent to your provider as well.   To learn more about what you can do with MyChart, go to [email or web address].    Your next appointment:   6 month(s)  Provider:   Jonathan Branch, MD    Other Instructions    

## 2022-05-29 NOTE — Progress Notes (Signed)
Clinical Summary Melissa Velazquez is a 72 y.o.female seen today for follow up of the following medical problems.    1. PAF - She underwent bilateral total knee replacements in 01/2020.  Her post op course was c/b AFib w RVR. Jan 29 2020 EKG review confirms afib - wanted to chagne from NOAC to coumadin due to cost.      - evaluated by Dr Lalla Brothers for [facility], has nickel allergy and thus not candidate.  - no significant palpitations. Has metoprolol prn, has not needed - compliant with coumadin, no bleeding on coumadin.   2.Kidney cancer - renal cell carcinoma - followed by Dr Marlou Porch with urology.            Works as Warden/ranger, has phD. Does both virtual and face to face visits  Past Medical History:  Diagnosis Date   Allergic rhinitis    Anal fissure    Hx of    Anxiety    hx of   Arthritis    Asthma    none in last year seasonal   Chronic kidney disease    partial nephrectomy   Depression    hx of   Diverticular disease    Dysrhythmia    a_fib   Eczema    Endometrial cancer (HCC) 2001   spread to appendix    Hemorrhoids    Hyperglycemia    Hypertension    Hypothyroidism    Kidney cancer, primary, with metastasis from kidney to other site [facility])    Obesity    Sebaceous cyst    Thyroid disease    Thyroid nodule    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     cetirizine (ZYRTEC) 10 MG tablet Take 10 mg by mouth daily as needed for  allergies.     Cholecalciferol (VITAMIN D3) 125 MCG (5000 UT) CAPS Take 5,000 Units by mouth daily.     fluconazole (DIFLUCAN) 150 MG tablet Take 150 mg by mouth See admin instructions. Take 150 mg every 4 days as needed for lichen planus or thrush     fluticasone (FLONASE) 50 MCG/ACT nasal spray Place 1 spray into both nostrils 2 (two) times daily as needed for allergies or rhinitis. 16 g 5   hydrocortisone (ANUSOL-HC) 2.5 % rectal cream Apply 1 Application topically 4 (four) times daily as needed for hemorrhoids.     levothyroxine (SYNTHROID, LEVOTHROID) 75 MCG tablet Take 75 mcg by mouth daily before breakfast.     magic mouthwash SOLN Take 5 mLs by mouth 2 (two) times daily as needed (Thrush / lichen planus). Swish and spit     meclizine (ANTIVERT) 25 MG tablet Take 25 mg by mouth every 6 (six) hours as needed.     metoprolol tartrate (LOPRESSOR) 25 MG tablet Take 0.5 tablets (12.5 mg total) by mouth as needed (for palpitations). 45 tablet 3   ondansetron (ZOFRAN) 8 MG tablet Take 8 mg by  mouth every 8 (eight) hours as needed.     Polyethyl Glycol-Propyl Glycol (SYSTANE OP) Place 2-3 drops into both eyes 4 (four) times daily as needed (itching / burning).     sertraline (ZOLOFT) 100 MG tablet Take 150 mg by mouth daily with breakfast.     simethicone (MYLICON) 125 MG chewable tablet Chew 125 mg by mouth every 6 (six) hours as needed for flatulence.     triamcinolone ointment (KENALOG) 0.5 % Apply 1 Application topically 2 (two) times daily as needed (itching).     warfarin (COUMADIN) 5 MG tablet TAKE 1 AND 1/2 TO 2 TABLETS BY MOUTH DAILY AS DIRECTED BY COUMADIN CLINIC 180 tablet 1   No current facility-administered medications for this visit.     Past Surgical History:  Procedure Laterality Date   APPENDECTOMY  01/19/2006   BIOPSY  12/14/2016   Procedure: BIOPSY;  Surgeon: Malissa Hippo, MD;  [facility];  Service: Endoscopy;;  colon   BIOPSY  07/25/2021   Procedure:  BIOPSY;  Surgeon: Dolores Frame, MD;  [facility];  Service: Gastroenterology;;   CHOLECYSTECTOMY  01/19/2006   COLONOSCOPY N/A 11/24/2012   Procedure: COLONOSCOPY;  Surgeon: Malissa Hippo, MD;  [facility];  Service: Endoscopy;  Laterality: N/A;  830   COLONOSCOPY N/A 12/14/2016   Procedure: COLONOSCOPY;  Surgeon: Malissa Hippo, MD;  [facility];  Service: Endoscopy;  Laterality: N/A;  12:00   COLONOSCOPY  07/25/2021   Bx of polyp CA +   COLONOSCOPY WITH PROPOFOL N/A 07/25/2021   Procedure: COLONOSCOPY WITH PROPOFOL;  Surgeon: Dolores Frame, MD;  [facility];  Service: Gastroenterology;  Laterality: N/A;  115 ASA 1   CYSTOSCOPY WITH STENT PLACEMENT N/A 10/09/2021   Procedure: CYSTOSCOPY WITH STENT PLACEMENT;  Surgeon: Crist Fat, MD;  Location: [facility];  Service: Urology;  Laterality: N/A;  1. Cystoscopy, retrograde pyelogram with interpretation 2. Bilateral temporary ureteral stent placement   IR RADIOLOGIST EVAL & MGMT  06/23/2021   IR RADIOLOGIST EVAL & MGMT  09/05/2021   LAPAROSCOPIC RIGHT HEMI COLECTOMY Right 10/09/2021   Procedure: LAPAROSCOPIC RIGHT HEMI COLECTOMY;  Surgeon: Andria Meuse, MD;  Location: [facility];  Service: General;  Laterality: Right;   POLYPECTOMY  12/14/2016   Procedure: POLYPECTOMY;  Surgeon: Malissa Hippo, MD;  [facility];  Service: Endoscopy;;  colon   POLYPECTOMY  07/25/2021   Procedure: POLYPECTOMY;  Surgeon: Dolores Frame, MD;  [facility];  Service: Gastroenterology;;   RADIOLOGY WITH ANESTHESIA N/A 08/06/2021   Procedure: IR WITH ANESTHESIA CRYOABLATION;  Surgeon: Irish Lack, MD;  Location: [facility];  Service: Radiology;  Laterality: N/A;   ROBOTIC ASSITED PARTIAL NEPHRECTOMY Left 08/21/2016   Procedure: XI ROBOTIC ASSITED PARTIAL NEPHRECTOMY;  Surgeon: Crist Fat, MD;  Location: [facility];  Service: Urology;  Laterality: Left;    TOTAL ABDOMINAL HYSTERECTOMY  01/20/1999   TOTAL KNEE ARTHROPLASTY Bilateral 01/24/2020   Procedure: TOTAL KNEE BILATERAL;  Surgeon: Ollen Gross, MD;  Location: [facility];  Service: Orthopedics;  Laterality: Bilateral;   WISDOM TOOTH EXTRACTION       Allergies  Allergen Reactions   Triple Antibiotic [Bacitracin-Neomycin-Polymyxin] Anaphylaxis   Levaquin [Levofloxacin] Other (See Comments)    Stomach upset   Oxycodone     Tremors and brain fog   Capsaicin Rash   Elastic Bandages & [Zinc] Rash   Nickel Itching and Rash  Family History  Problem Relation Age of Onset   Breast cancer Mother    Diabetes Mother    Obesity Sister    Asthma Sister    Eczema Sister    COPD Father        smoked   Prostate cancer Father    Breast cancer Maternal Aunt    Urticaria Maternal Grandfather    Colon cancer Neg Hx      Social History Ms. Pellissier reports that she quit smoking about 24 years ago. Her smoking use included cigarettes. She has a 20.00 pack-year smoking history. She has been exposed to tobacco smoke. She has never used smokeless tobacco. Ms. Channel reports current alcohol use.   Review of Systems CONSTITUTIONAL: No weight loss, fever, chills, weakness or fatigue.  HEENT: Eyes: No visual loss, blurred vision, double vision or yellow sclerae.No hearing loss, sneezing, congestion, runny nose or sore throat.  SKIN: No rash or itching.  CARDIOVASCULAR: per hpi RESPIRATORY: No shortness of breath, cough or sputum.  GASTROINTESTINAL: No anorexia, nausea, vomiting or diarrhea. No abdominal pain or blood.  GENITOURINARY: No burning on urination, no polyuria NEUROLOGICAL: No headache, dizziness, syncope, paralysis, ataxia, numbness or tingling in the extremities. No change in bowel or bladder control.  MUSCULOSKELETAL: No muscle, back pain, joint pain or stiffness.  LYMPHATICS: No enlarged nodes. No history of splenectomy.  PSYCHIATRIC: No history of depression or anxiety.   ENDOCRINOLOGIC: No reports of sweating, cold or heat intolerance. No polyuria or polydipsia.  Marland Kitchen   Physical Examination Today's Vitals   05/29/22 1327  BP: 124/84  Pulse: 64  SpO2: 97%  Weight: 210 lb 12.8 oz (95.6 kg)  Height: 5\' 4"  (1.626 m)   Body mass index is 36.18 kg/m.  Gen: resting comfortably, no acute distress HEENT: no scleral icterus, pupils equal round and reactive, no palptable cervical adenopathy,  CV: RRR, no m/rg, no jvd Resp: Clear to auscultation bilaterally GI: abdomen is soft, non-tender, non-distended, normal bowel sounds, no hepatosplenomegaly MSK: extremities are warm, no edema.  Skin: warm, no rash Neuro:  no focal deficits Psych: appropriate affect   Diagnostic Studies  Jan 2022 echo   IMPRESSIONS     1. Left ventricular ejection fraction, by estimation, is 60 to 65%. The  left ventricle has normal function. The left ventricle has no regional  wall motion abnormalities. There is mild concentric left ventricular  hypertrophy and moderate basal septal  hypertrophy.   2. Right ventricular systolic function is mildly reduced. The right  ventricular size is normal. There is moderately elevated pulmonary artery  systolic pressure. The estimated right ventricular systolic pressure is  48.2 mmHg.   3. Large pleural effusion in the left lateral region.   4. The mitral valve is normal in structure. No evidence of mitral valve  regurgitation. No evidence of mitral stenosis.   5. The aortic valve is tricuspid. Aortic valve regurgitation is not  visualized. Mild aortic valve sclerosis is present, with no evidence of  aortic valve stenosis. Aortic valve area, by VTI measures 2.41 cm. Aortic  valve mean gradient measures 4.0 mmHg.   Aortic valve Vmax measures 1.51 m/s.   6. The inferior vena cava is dilated in size with <50% respiratory  variability, suggesting right atrial pressure of 15 mmHg.   7. Tricuspid valve regurgitation is mild to  moderate.   Assessment and Plan  1. PAF/acquired thrombophilia - NOACs were expensive, has transitioned to coumadin. - no symptoms, continue current meds - have  not seen recurrent afib, there is a possibility this was isolated in there postop period - may consider outpatient monitor in the future if no clinical recurrence, she has interest in coming off anticoag if can be done safely.     Antoine Poche, M.D.

## 2022-06-09 ENCOUNTER — Other Ambulatory Visit: Payer: Self-pay | Admitting: Cardiology

## 2022-06-09 NOTE — Telephone Encounter (Signed)
Refill request for warfarin:  Last INR was 2.4 on 05/18/22 Next INR due on 06/18/22 LOV was 05/29/22  Dominga Ferry MD  Refill approved.

## 2022-06-18 ENCOUNTER — Ambulatory Visit: Payer: Medicare Other | Attending: Cardiology | Admitting: *Deleted

## 2022-06-18 DIAGNOSIS — I480 Paroxysmal atrial fibrillation: Secondary | ICD-10-CM | POA: Diagnosis not present

## 2022-06-18 DIAGNOSIS — Z5181 Encounter for therapeutic drug level monitoring: Secondary | ICD-10-CM | POA: Insufficient documentation

## 2022-06-18 LAB — POCT INR: POC INR: 2.1

## 2022-06-18 NOTE — Patient Instructions (Signed)
Description   Continue warfarin 1 1/2 tablets daily except 2 tablets on Thursdays Recheck in 6 wks

## 2022-07-21 DIAGNOSIS — E6609 Other obesity due to excess calories: Secondary | ICD-10-CM | POA: Diagnosis not present

## 2022-07-21 DIAGNOSIS — R7309 Other abnormal glucose: Secondary | ICD-10-CM | POA: Diagnosis not present

## 2022-07-21 DIAGNOSIS — R5383 Other fatigue: Secondary | ICD-10-CM | POA: Diagnosis not present

## 2022-07-21 DIAGNOSIS — I10 Essential (primary) hypertension: Secondary | ICD-10-CM | POA: Diagnosis not present

## 2022-07-21 DIAGNOSIS — Z20828 Contact with and (suspected) exposure to other viral communicable diseases: Secondary | ICD-10-CM | POA: Diagnosis not present

## 2022-07-21 DIAGNOSIS — Z6836 Body mass index (BMI) 36.0-36.9, adult: Secondary | ICD-10-CM | POA: Diagnosis not present

## 2022-07-21 DIAGNOSIS — E039 Hypothyroidism, unspecified: Secondary | ICD-10-CM | POA: Diagnosis not present

## 2022-07-30 ENCOUNTER — Ambulatory Visit: Payer: Medicare Other | Attending: Cardiology | Admitting: *Deleted

## 2022-07-30 DIAGNOSIS — Z5181 Encounter for therapeutic drug level monitoring: Secondary | ICD-10-CM | POA: Diagnosis not present

## 2022-07-30 DIAGNOSIS — I480 Paroxysmal atrial fibrillation: Secondary | ICD-10-CM | POA: Diagnosis not present

## 2022-07-30 LAB — POCT INR: INR: 1.8 — AB (ref 2.0–3.0)

## 2022-07-30 NOTE — Patient Instructions (Signed)
Take warfarin 3 tablets tonight then resume 1 1/2 tablets daily except 2 tablets on Thursdays Recheck in 4 wks

## 2022-08-10 DIAGNOSIS — K1370 Unspecified lesions of oral mucosa: Secondary | ICD-10-CM | POA: Diagnosis not present

## 2022-08-13 DIAGNOSIS — D2239 Melanocytic nevi of other parts of face: Secondary | ICD-10-CM | POA: Diagnosis not present

## 2022-08-13 DIAGNOSIS — D225 Melanocytic nevi of trunk: Secondary | ICD-10-CM | POA: Diagnosis not present

## 2022-08-13 DIAGNOSIS — L814 Other melanin hyperpigmentation: Secondary | ICD-10-CM | POA: Diagnosis not present

## 2022-08-13 DIAGNOSIS — L821 Other seborrheic keratosis: Secondary | ICD-10-CM | POA: Diagnosis not present

## 2022-08-13 DIAGNOSIS — S20461A Insect bite (nonvenomous) of right back wall of thorax, initial encounter: Secondary | ICD-10-CM | POA: Diagnosis not present

## 2022-08-31 ENCOUNTER — Ambulatory Visit: Payer: Medicare Other | Attending: Cardiology | Admitting: *Deleted

## 2022-08-31 DIAGNOSIS — Z5181 Encounter for therapeutic drug level monitoring: Secondary | ICD-10-CM | POA: Diagnosis not present

## 2022-08-31 DIAGNOSIS — I480 Paroxysmal atrial fibrillation: Secondary | ICD-10-CM | POA: Insufficient documentation

## 2022-08-31 LAB — POCT INR: INR: 2 (ref 2.0–3.0)

## 2022-08-31 NOTE — Patient Instructions (Signed)
Increase warfarin to 1 1/2 tablets daily except 2 tablets on Mondays and Thursdays Recheck in 4 wks

## 2022-09-09 ENCOUNTER — Other Ambulatory Visit: Payer: Self-pay | Admitting: Cardiology

## 2022-09-09 DIAGNOSIS — Z6837 Body mass index (BMI) 37.0-37.9, adult: Secondary | ICD-10-CM | POA: Diagnosis not present

## 2022-09-09 DIAGNOSIS — L2381 Allergic contact dermatitis due to animal (cat) (dog) dander: Secondary | ICD-10-CM | POA: Diagnosis not present

## 2022-09-09 DIAGNOSIS — E6609 Other obesity due to excess calories: Secondary | ICD-10-CM | POA: Diagnosis not present

## 2022-09-09 NOTE — Telephone Encounter (Signed)
Refill request for warfarin:  Last INR was 2.0 on 08/31/22 Next INR due 09/28/22 LOV was 05/29/22  Refill approved.

## 2022-09-14 ENCOUNTER — Ambulatory Visit: Payer: Medicare Other | Admitting: Internal Medicine

## 2022-09-14 ENCOUNTER — Encounter: Payer: Self-pay | Admitting: Internal Medicine

## 2022-09-14 ENCOUNTER — Other Ambulatory Visit: Payer: Self-pay

## 2022-09-14 VITALS — BP 120/78 | HR 67 | Temp 98.1°F | Ht 64.0 in | Wt 216.8 lb

## 2022-09-14 DIAGNOSIS — J3089 Other allergic rhinitis: Secondary | ICD-10-CM

## 2022-09-14 DIAGNOSIS — J302 Other seasonal allergic rhinitis: Secondary | ICD-10-CM | POA: Diagnosis not present

## 2022-09-14 DIAGNOSIS — J4520 Mild intermittent asthma, uncomplicated: Secondary | ICD-10-CM | POA: Diagnosis not present

## 2022-09-14 MED ORDER — ALBUTEROL SULFATE HFA 108 (90 BASE) MCG/ACT IN AERS: 2.0000 | INHALATION_SPRAY | Freq: Four times a day (QID) | RESPIRATORY_TRACT | 1 refills | Status: DC | PRN

## 2022-09-14 MED ORDER — FLUTICASONE PROPIONATE 50 MCG/ACT NA SUSP: 2.0000 | Freq: Every day | NASAL | 5 refills | Status: DC

## 2022-09-14 MED ORDER — OLOPATADINE HCL 0.2 % OP SOLN: 1.0000 [drp] | Freq: Every day | OPHTHALMIC | 5 refills | Status: DC | PRN

## 2022-09-14 MED ORDER — AZELASTINE HCL 0.1 % NA SOLN: 1.0000 | Freq: Two times a day (BID) | NASAL | 5 refills | Status: DC | PRN

## 2022-09-14 MED ORDER — CETIRIZINE HCL 10 MG PO TABS: 10.0000 mg | ORAL_TABLET | Freq: Every day | ORAL | 5 refills | Status: DC

## 2022-09-14 NOTE — Patient Instructions (Addendum)
Allergic Rhinitis: - Positive skin test 12/2019: grasses, ragweed, weeds, trees, indoor molds, outdoor molds, dust mites and dog  - Avoidance measures discussed. - Use nasal saline rinses before nose sprays such as with Neilmed Sinus Rinse.  Use distilled water.   - Use Flonase 2 sprays each nostril daily. Aim upward and outward. - Use Azelastine 1-2 sprays each nostril twice daily as needed for runny nose, drainage, sneezing, congestion. Aim upward and outward. - Use Zyrtec 10 mg daily.  - For eyes, use Olopatadine or Ketotifen 1 eye drop daily as needed for itchy, watery eyes.  Available over the counter, if not covered by insurance.  - Consider allergy shots as long term control of your symptoms by teaching your immune system to be more tolerant of your allergy triggers.  Did discuss the high risk of severe reaction with beta blocker use (metoprolol) and also that her current malignancy (solid tumors and not active) is not an absolute contraindication.     Mild Intermittent Asthma - Well controlled.  Rarely needs albuterol. Spirometry today was normal.  - Rescue inhaler: Albuterol 2 puffs via spacer or 1 vial via nebulizer every 4-6 hours as needed for respiratory symptoms of cough, shortness of breath, or wheezing Asthma control goals:  Full participation in all desired activities (may need albuterol before activity) Albuterol use two times or less a week on average (not counting use with activity) Cough interfering with sleep two times or less a month Oral steroids no more than once a year No hospitalizations

## 2022-09-14 NOTE — Progress Notes (Signed)
FOLLOW UP Date of Service/Encounter:  09/14/22   Subjective:  Melissa Velazquez (DOB: 05-26-1950) is a 72 y.o. female [facility] for follow up for asthma and allergic rhinitis.   History obtained from: chart review and patient. Last visit was with Dr Dellis Anes on 12/29/2019 for patch testing.  Also with allergic rhinitis and asthma. On Flonase, Zyrtec, Albuterol PRN. Also with renal cell carcinoma, cecal adenocarcinoma and hx of endometrial cancer.   Since last visit, she reports having a flare up of allergies recently.  She was given prednisone which is helping.  Has noted increased congestion, drainage, runny nose, itchy red watery eyes.  Tried Zyrtec without relief. Not using any nose sprays.  She does wonder if it is due to her new dog or from mowing the lawn.  She has considered allergy shots.   Her asthma is doing well.  Can't recall the last time she used Albuterol, she still has it but rarely needs it.  No ER visits.oral prednisone since last visit for asthma.  No nighttime symptoms.   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 Physical Exam: GEN: alert, well developed HEENT:  clear conjunctiva, TM grey and translucent, nose with mild inferior turbinate hypertrophy, pink nasal mucosa, + clear rhinorrhea, no cobblestoning HEART: regular rate and rhythm, no murmur LUNGS: clear to auscultation bilaterally, no coughing, unlabored respiration SKIN: no rashes or lesions  Spirometry:  Tracings reviewed. Her effort: Good reproducible efforts. FVC: 2.56L FEV1: 2.14L, 99% predicted FEV1/FVC ratio: 84% Interpretation: Spirometry consistent with normal pattern.  Please see scanned spirometry results for details.  Assessment:   1. Mild intermittent asthma without complication   2. Seasonal and perennial allergic rhinitis     Plan/Recommendations:  Allergic Rhinitis: - Not controlled. Will try medical management and if not improved, consider AIT.  - Positive skin test 12/2019: grasses, ragweed, weeds, trees, indoor molds, outdoor molds, dust mites and dog  - Avoidance measures discussed. - Use nasal saline rinses before nose sprays such as with Neilmed Sinus Rinse.  Use distilled water.   - Use Flonase 2 sprays each nostril daily. Aim upward and outward. - Use Azelastine 1-2 sprays each nostril twice daily as needed for runny nose, drainage, sneezing, congestion. Aim upward and outward. - Use Zyrtec 10 mg daily.  - For eyes, use Olopatadine  or Ketotifen 1 eye drop daily as needed for itchy, watery eyes.  Available over the counter, if not covered by insurance.  - Consider allergy shots as long term control of your symptoms by teaching your immune system to be more tolerant of your allergy triggers.  Did discuss the high risk of severe reaction with beta blocker use (metoprolol) and also that her current malignancy (solid tumors and not active) is not an absolute contraindication.     Mild Intermittent Asthma - Well controlled.  Rarely needs albuterol. Spirometry today was normal.  - Rescue inhaler: Albuterol 2 puffs via spacer or 1 vial via nebulizer every 4-6 hours as  needed for respiratory symptoms of cough, shortness of breath, or wheezing Asthma control goals:  Full participation in all desired activities (may need albuterol before activity) Albuterol use two times or less a week on average (not counting use with activity) Cough interfering with sleep two times or less a month Oral steroids no more than once a year No hospitalizations    Return in about 2 months (around 11/14/2022).  Alesia Morin, MD [facility]

## 2022-09-14 NOTE — Addendum Note (Signed)
Addended by: Philipp Deputy on: 09/14/2022 10:52 AM   Modules accepted: Orders

## 2022-10-05 ENCOUNTER — Ambulatory Visit: Payer: Medicare Other | Attending: Cardiology | Admitting: *Deleted

## 2022-10-05 DIAGNOSIS — Z5181 Encounter for therapeutic drug level monitoring: Secondary | ICD-10-CM | POA: Insufficient documentation

## 2022-10-05 DIAGNOSIS — I480 Paroxysmal atrial fibrillation: Secondary | ICD-10-CM | POA: Insufficient documentation

## 2022-10-05 LAB — POCT INR: POC INR: 2.4

## 2022-10-05 NOTE — Patient Instructions (Signed)
Description   Continue warfarin to 1 1/2 tablets daily except 2 tablets on Mondays and Thursdays Recheck in 5 wks

## 2022-10-08 NOTE — Patient Instructions (Addendum)
Asthma  Continue albuterol 2 puffs once every 4 hours as needed for cough or wheeze  Allergic rhinitis Continue allergen avoidance measures directed toward grass pollen, weed pollen, ragweed pollen, tree pollen, mold, dust mite, and dog as listed below Continue cetirizine 10 mg once a day as needed for runny nose or itch. Remember to rotate to a different antihistamine about every 3 months. Some examples of over the counter antihistamines include Zyrtec (cetirizine), Xyzal (levocetirizine), Allegra (fexofenadine), and Claritin (loratidine).  Continue azelastine 2 sprays in each nostril once or twice a day as needed for runny nose Continue Flonase 2 sprays in each nostril once a day as needed for stuffy nose as Consider saline nasal rinses as needed for nasal symptoms. Use this before any medicated nasal sprays for best result Consider allergen immunotherapy if your symptoms are not well-controlled with the treatment plan as listed above.  Make an appointment with the clinic for your first allergy injection if interested.  We discussed allergen immunotherapy for dog only as well as allergen immunotherapy including pollens, mold, dust mite, and dog.    Allergic conjunctivitis Continue olopatadine 1 drop in each eye once a day as needed for red or itchy eyes.   If olopatadine does not improve your symptoms, consider cromolyn eye drops 1-2 drops in each eye up to 4 times a day Avoid eyedrops that clean to keep the red out Consider a lubricating eye drop. Use before allergy eye drops for best result  Allergic contact dermatitis Continue to avoid substances containing Nickel Sulfate, Fragrance Mix, Thimerasl, p-tert Butylphenol Formaldehyde Resin, Epoxy Resin, and Carba Mix    Atopic dermatitis Continue a twice a day moisturizing routine Begin Eucrisa up to twice a day as needed for red and itchy areas Begin triamcinolone 0.1% ointment to red and itchy areas below your face up to twice a day as  needed. Do not use this medication longer than 2 weeks in a row  Call the clinic if this treatment plan is not working well for you  Follow up in 3 months or sooner if needed.  Reducing Pollen Exposure The American Academy of Allergy, Asthma and Immunology suggests the following steps to reduce your exposure to pollen during allergy seasons. Do not hang sheets or clothing out to dry; pollen may collect on these items. Do not mow lawns or spend time around freshly cut grass; mowing stirs up pollen. Keep windows closed at night.  Keep car windows closed while driving. Minimize morning activities outdoors, a time when pollen counts are usually at their highest. Stay indoors as much as possible when pollen counts or humidity is high and on windy days when pollen tends to remain in the air longer. Use air conditioning when possible.  Many air conditioners have filters that trap the pollen spores. Use a HEPA room air filter to remove pollen form the indoor air you breathe.  Antacidsmol   Control of Dust Mite Allergen Dust mites play a major role in allergic asthma and rhinitis. They occur in environments with high humidity wherever human skin is found. Dust mites absorb humidity from the atmosphere (ie, they do not drink) and feed on organic matter (including shed human and animal skin). Dust mites are a microscopic type of insect that you cannot see with the naked eye. High levels of dust mites have been detected from mattresses, pillows, carpets, upholstered furniture, bed covers, clothes, soft toys and any woven material. The principal allergen of the dust mite is found  in its feces. A gram of dust may contain 1,000 mites and 250,000 fecal particles. Mite antigen is easily measured in the air during house cleaning activities. Dust mites do not bite and do not cause harm to humans, other than by triggering allergies/asthma.  Ways to decrease your exposure to dust mites in your home:  1. Encase  mattresses, box springs and pillows with a mite-impermeable barrier or cover  2. Wash sheets, blankets and drapes weekly in hot water (130 F) with detergent and dry them in a dryer on the hot setting.  3. Have the room cleaned frequently with a vacuum cleaner and a damp dust-mop. For carpeting or rugs, vacuuming with a vacuum cleaner equipped with a high-efficiency particulate air (HEPA) filter. The dust mite allergic individual should not be in a room which is being cleaned and should wait 1 hour after cleaning before going into the room.  4. Do not sleep on upholstered furniture (eg, couches).  5. If possible removing carpeting, upholstered furniture and drapery from the home is ideal. Horizontal blinds should be eliminated in the rooms where the person spends the most time (bedroom, study, television room). Washable vinyl, roller-type shades are optimal.  6. Remove all non-washable stuffed toys from the bedroom. Wash stuffed toys weekly like sheets and blankets above.  7. Reduce indoor humidity to less than 50%. Inexpensive humidity monitors can be purchased at most hardware stores. Do not use a humidifier as can make the problem worse and are not recommended.  Control of Dog or Cat Allergen Avoidance is the best way to manage a dog or cat allergy. If you have a dog or cat and are allergic to dog or cats, consider removing the dog or cat from the home. If you have a dog or cat but don't want to find it a new home, or if your family wants a pet even though someone in the household is allergic, here are some strategies that may help keep symptoms at bay:  Keep the pet out of your bedroom and restrict it to only a few rooms. Be advised that keeping the dog or cat in only one room will not limit the allergens to that room. Don't pet, hug or kiss the dog or cat; if you do, wash your hands with soap and water. High-efficiency particulate air (HEPA) cleaners run continuously in a bedroom or living  room can reduce allergen levels over time. Regular use of a high-efficiency vacuum cleaner or a central vacuum can reduce allergen levels. Giving your dog or cat a bath at least once a week can reduce airborne allergen.

## 2022-10-08 NOTE — Progress Notes (Unsigned)
[address] [place] [address] Dept: [phone]  FOLLOW UP NOTE  Patient ID: Melissa Velazquez, female    DOB: August 12, 1950  Age: 72 y.o. MRN: [id number] Date of Office Visit: 10/09/2022  Assessment  Chief Complaint: No chief complaint on file.  HPI Melissa Velazquez is a 72 year old female who presents to the clinic for follow-up visit.  She was last seen in this clinic on 09/14/2022 by Dr. Allena Katz for evaluation of asthma, allergic rhinitis, and allergic conjunctivitis.  Her last environmental allergy skin testing was on 12/28/2019 was positive to grass pollen, weed pollen, ragweed pollen, tree pollen, mold, dust mite, and dog.  Patch testing  on 12/29/2019 was positive to Nickel Sulfate, Fragrance Mix, Thimerasl, p-tert Butylphenol Formaldehyde Resin, Epoxy Resin, and Carba Mix    Drug Allergies:  Allergies  Allergen Reactions  . Triple Antibiotic [Bacitracin-Neomycin-Polymyxin] Anaphylaxis  . Levaquin [Levofloxacin] Other (See Comments)    Stomach upset  . Oxycodone     Tremors and brain fog  . Capsaicin Rash  . Elastic Bandages & [Zinc] Rash  . Nickel Itching and Rash    Physical Exam: There were no vitals taken for this visit.   Physical Exam  Diagnostics:    Assessment and Plan: No diagnosis found.  No orders of the defined types were placed in this encounter.   There are no Patient Instructions on file for this visit.  No follow-ups on file.    Thank you for the opportunity to care for this patient.  Please do not hesitate to contact me with questions.  Thermon Leyland, FNP [facility]

## 2022-10-09 ENCOUNTER — Encounter: Payer: Self-pay | Admitting: Family Medicine

## 2022-10-09 ENCOUNTER — Other Ambulatory Visit: Payer: Self-pay | Admitting: Family Medicine

## 2022-10-09 ENCOUNTER — Ambulatory Visit (INDEPENDENT_AMBULATORY_CARE_PROVIDER_SITE_OTHER): Payer: Medicare Other | Admitting: Family Medicine

## 2022-10-09 ENCOUNTER — Other Ambulatory Visit: Payer: Self-pay

## 2022-10-09 VITALS — BP 120/82 | HR 72 | Temp 97.7°F | Resp 16 | Wt 215.5 lb

## 2022-10-09 DIAGNOSIS — J3089 Other allergic rhinitis: Secondary | ICD-10-CM

## 2022-10-09 DIAGNOSIS — J302 Other seasonal allergic rhinitis: Secondary | ICD-10-CM

## 2022-10-09 DIAGNOSIS — J4520 Mild intermittent asthma, uncomplicated: Secondary | ICD-10-CM

## 2022-10-09 DIAGNOSIS — H1010 Acute atopic conjunctivitis, unspecified eye: Secondary | ICD-10-CM

## 2022-10-09 DIAGNOSIS — L2084 Intrinsic (allergic) eczema: Secondary | ICD-10-CM | POA: Diagnosis not present

## 2022-10-09 DIAGNOSIS — H1013 Acute atopic conjunctivitis, bilateral: Secondary | ICD-10-CM

## 2022-10-09 MED ORDER — TRIAMCINOLONE ACETONIDE 0.1 % EX OINT
1.0000 | TOPICAL_OINTMENT | Freq: Two times a day (BID) | CUTANEOUS | 2 refills | Status: DC | PRN
Start: 2022-10-09 — End: 2022-11-16

## 2022-10-09 MED ORDER — EUCRISA 2 % EX OINT: TOPICAL_OINTMENT | CUTANEOUS | 5 refills | Status: DC

## 2022-10-09 MED ORDER — CROMOLYN SODIUM 4 % OP SOLN: OPHTHALMIC | 5 refills | Status: DC

## 2022-10-11 ENCOUNTER — Encounter: Payer: Self-pay | Admitting: Family Medicine

## 2022-10-11 DIAGNOSIS — L2084 Intrinsic (allergic) eczema: Secondary | ICD-10-CM | POA: Insufficient documentation

## 2022-10-11 DIAGNOSIS — H1010 Acute atopic conjunctivitis, unspecified eye: Secondary | ICD-10-CM | POA: Insufficient documentation

## 2022-10-12 ENCOUNTER — Other Ambulatory Visit (HOSPITAL_COMMUNITY): Payer: Self-pay

## 2022-10-12 DIAGNOSIS — Z23 Encounter for immunization: Secondary | ICD-10-CM | POA: Diagnosis not present

## 2022-10-12 NOTE — Telephone Encounter (Signed)
Has the preferred alternatives of Tacrolimus 0.03% or 0.1% ointment or Ammonium Lactate lotion or cream been tried and failed, or is likely to cause adverse reaction or be ineffective?

## 2022-10-13 NOTE — Telephone Encounter (Signed)
Tacrolimus has been sent in to the pharmacy. I called patient and left a message to call the office back to inform.

## 2022-10-13 NOTE — Telephone Encounter (Signed)
Can you please try tacrolimus to red and itchy areas as a substitute for [place]. Thank you

## 2022-10-16 ENCOUNTER — Ambulatory Visit: Payer: Medicare Other | Admitting: Allergy & Immunology

## 2022-10-29 ENCOUNTER — Ambulatory Visit: Payer: Medicare Other | Attending: Cardiology | Admitting: *Deleted

## 2022-10-29 DIAGNOSIS — Z5181 Encounter for therapeutic drug level monitoring: Secondary | ICD-10-CM | POA: Insufficient documentation

## 2022-10-29 DIAGNOSIS — I480 Paroxysmal atrial fibrillation: Secondary | ICD-10-CM | POA: Insufficient documentation

## 2022-10-29 LAB — POCT INR: INR: 2 (ref 2.0–3.0)

## 2022-10-29 NOTE — Patient Instructions (Signed)
Continue warfarin to 1 1/2 tablets daily except 2 tablets on Mondays and Thursdays Recheck in 6 wks

## 2022-11-16 ENCOUNTER — Other Ambulatory Visit: Payer: Self-pay

## 2022-11-16 ENCOUNTER — Ambulatory Visit (INDEPENDENT_AMBULATORY_CARE_PROVIDER_SITE_OTHER): Payer: Medicare Other | Admitting: Internal Medicine

## 2022-11-16 VITALS — Wt 200.0 lb

## 2022-11-16 DIAGNOSIS — L2084 Intrinsic (allergic) eczema: Secondary | ICD-10-CM

## 2022-11-16 DIAGNOSIS — C180 Malignant neoplasm of cecum: Secondary | ICD-10-CM

## 2022-11-16 DIAGNOSIS — L2389 Allergic contact dermatitis due to other agents: Secondary | ICD-10-CM

## 2022-11-16 DIAGNOSIS — J302 Other seasonal allergic rhinitis: Secondary | ICD-10-CM

## 2022-11-16 DIAGNOSIS — J4520 Mild intermittent asthma, uncomplicated: Secondary | ICD-10-CM | POA: Diagnosis not present

## 2022-11-16 DIAGNOSIS — J3089 Other allergic rhinitis: Secondary | ICD-10-CM

## 2022-11-16 MED ORDER — TRIAMCINOLONE ACETONIDE 0.1 % EX OINT: TOPICAL_OINTMENT | CUTANEOUS | 5 refills | Status: AC

## 2022-11-16 MED ORDER — FLUTICASONE PROPIONATE 50 MCG/ACT NA SUSP: 2.0000 | Freq: Every day | NASAL | 5 refills | Status: AC

## 2022-11-16 MED ORDER — CROMOLYN SODIUM 4 % OP SOLN: OPHTHALMIC | 5 refills | Status: DC

## 2022-11-16 MED ORDER — ALBUTEROL SULFATE HFA 108 (90 BASE) MCG/ACT IN AERS: 2.0000 | INHALATION_SPRAY | Freq: Four times a day (QID) | RESPIRATORY_TRACT | 2 refills | Status: AC | PRN

## 2022-11-16 NOTE — Patient Instructions (Addendum)
Mild Intermittent Asthma - Well controlled.  - Continue albuterol 1-2 puffs once every 4 hours as needed for cough or wheeze  Allergic rhinitis - SPT 12/2019: positive to grass pollen, weed pollen, ragweed pollen, tree pollen, mold, dust mite, and dog. - Avoidance measures discussed. - Consider saline nasal rinses as needed for nasal symptoms. Use this before any medicated nasal sprays for best result - Use Flonase 2 sprays in each nostril once a day.   - Use Azelastine or Astepro 2 sprays in each nostril once or twice a day as needed for runny nose. - Use Zyrtec 10mg  daily as needed for runny nose, sneezing, itchy watery eyes.  - Use Cromolyn 4% 1-2 eye drops up to four times as needed for itchy watery eyes.   Allergic contact dermatitis - Continue to avoid substances containing Nickel Sulfate, Fragrance Mix, Thimerasl, p-tert Butylphenol Formaldehyde Resin, Epoxy Resin, and Carba Mix    Atopic dermatitis - Do a daily soaking tub bath in warm water for 10-15 minutes.  - Use a gentle, unscented cleanser at the end of the bath (such as Dove unscented bar or baby wash, or Aveeno sensitive body wash). Then rinse, pat half-way dry, and apply a gentle, unscented moisturizer cream or ointment (Cerave, Cetaphil, Eucerin, Aveeno)  all over while still damp. Dry skin makes the itching and rash of eczema worse. The skin should be moisturized with a gentle, unscented moisturizer at least twice daily.  - Use only unscented liquid laundry detergent. - Apply prescribed topical steroid (triamcinolone 0.1% below neck) to flared areas (red and thickened eczema) after the moisturizer has soaked into the skin (wait at least 30 minutes). Taper off the topical steroids as the skin improves. Do not use topical steroid for more than 7-10 days at a time.

## 2022-11-16 NOTE — Progress Notes (Signed)
RE: Melissa Velazquez MRN: [id number] DOB: [birth year] Date of Telemedicine Visit: 11/16/2022  Referring provider: Assunta Found, MD Primary care provider: Assunta Found, MD  Chief Complaint: Asthma (Says she is okay.) and Allergic Rhinitis  (Says she will not do the immunotherapy here as she has moved to another. Says since the move she has had itchy, watery eyes. Medications are too expensive she expressed. )   Telemedicine Follow Up Visit via Telephone: I connected with Melissa Velazquez for a follow up on 11/16/22 by telephone and verified that I am speaking with the correct person using two identifiers.   I discussed the limitations, risks, security and privacy concerns of performing an evaluation and management service by telephone and the availability of in person appointments. I also discussed with the patient that there may be a patient responsible charge related to this service. The patient expressed understanding and agreed to proceed.  Patient is at home accompanied by herself who provided/contributed to the history.  Provider is at the office.  Visit start time: 11:32 AM Visit end time: 11:48 AM Insurance consent/check in by: front desk Medical consent and medical assistant/nurse: Cree   History of Present Illness:  She is a 72 y.o. female, who is being followed for intermittent asthma, allergic rhinitis, eczema, contact dermatitis. Her previous allergy office visit was in 10/09/2022 with Thermon Leyland, FNP.   Since last visit, she reports her allergy symptoms have been doing better.  Not as much itchy watery eyes, congestion, drainage.  Taking Flonase, Zyrtec daily and uses Cromolyn eye drops PRN which have helped.  Also was previously on Azelastine but it was expensive; has not tried Astepro OTC.  Cromolyn works better for her than Olopatadine. She wishes to hold off on AIT at this time as she has moved to [facility] and likely will establish care at [facility] there.   Asthma  is well controlled.  Has not required her albuterol in almost a year.  Usually only flares up with illness.  No ER visits/oral prednisone use for asthma in the last year.  Skin is doing okay.  Has noted some dryness.  Not using moisturizers but has triamcinolone PRN.  Previously prescribed Protopic/Eucrisa but has never needed them or tried it.   Otherwise, there have been no changes to her past medical history, surgical history, family history, or social history.  Assessment and Plan:  Melissa Velazquez is a 72 y.o. female with:  Seasonal and perennial allergic rhinitis  Mild intermittent asthma without complication  Intrinsic atopic dermatitis  Allergic contact dermatitis due to other agents  Mild Intermittent Asthma - Well controlled.  - Continue albuterol 1-2 puffs once every 4 hours as needed for cough or wheeze  Allergic rhinitis - SPT 12/2019: positive to grass pollen, weed pollen, ragweed pollen, tree pollen, mold, dust mite, and dog. - Avoidance measures discussed. - Consider saline nasal rinses as needed for nasal symptoms. Use this before any medicated nasal sprays for best result - Use Flonase 2 sprays in each nostril once a day.   - Use Azelastine or Astepro 2 sprays in each nostril once or twice a day as needed for runny nose. - Use Zyrtec 10mg  daily as needed for runny nose, sneezing, itchy watery eyes.  - Use Cromolyn 4% 1-2 eye drops up to four times as needed for itchy watery eyes.   Allergic contact dermatitis - Continue to avoid substances containing Nickel Sulfate, Fragrance Mix, Thimerasl, p-tert Butylphenol Formaldehyde Resin, Epoxy Resin, and Carba  Mix    Atopic dermatitis - Do a daily soaking tub bath in warm water for 10-15 minutes.  - Use a gentle, unscented cleanser at the end of the bath (such as Dove unscented bar or baby wash, or Aveeno sensitive body wash). Then rinse, pat half-way dry, and apply a gentle, unscented moisturizer cream or ointment (Cerave,  Cetaphil, Eucerin, Aveeno)  all over while still damp. Dry skin makes the itching and rash of eczema worse. The skin should be moisturized with a gentle, unscented moisturizer at least twice daily.  - Use only unscented liquid laundry detergent. - Apply prescribed topical steroid (triamcinolone 0.1% below neck) to flared areas (red and thickened eczema) after the moisturizer has soaked into the skin (wait at least 30 minutes). Taper off the topical steroids as the skin improves. Do not use topical steroid for more than 7-10 days at a time.   For follow up: will call back to schedule 3-4 month follow up as she has moved; discussed consider our [facility] office or establish care with [facility]   Diagnostics: None.  Medication List:  Current Outpatient Medications  Medication Sig Dispense Refill   albuterol (VENTOLIN HFA) 108 (90 Base) MCG/ACT inhaler Inhale 2 puffs into the lungs every 6 (six) hours as needed for wheezing or shortness of breath. 8 g 1   cetirizine (ZYRTEC) 10 MG tablet Take 1 tablet (10 mg total) by mouth daily. 30 tablet 5   cromolyn (OPTICROM) 4 % ophthalmic solution If olopatadine does not improve your symptoms, consider cromolyn eye drops 1-2 drops in each eye up to 4 times a day 10 mL 5   fluticasone (FLONASE) 50 MCG/ACT nasal spray Place 2 sprays into both nostrils daily. 16 g 5   levothyroxine (SYNTHROID) 75 MCG tablet Take 75 mcg by mouth daily.     metoprolol tartrate (LOPRESSOR) 25 MG tablet Take 0.5 tablets (12.5 mg total) by mouth as needed (for palpitations). 45 tablet 3   Polyethyl Glycol-Propyl Glycol (SYSTANE OP) Place 2-3 drops into both eyes 4 (four) times daily as needed (itching / burning).     sertraline (ZOLOFT) 100 MG tablet Take 150 mg by mouth daily with breakfast.     simethicone (MYLICON) 125 MG chewable tablet Chew 125 mg by mouth every 6 (six) hours as needed for flatulence.     triamcinolone ointment (KENALOG) 0.1 % Apply 1 Application  topically 2 (two) times daily as needed. 30 g 2   warfarin (COUMADIN) 5 MG tablet TAKE 1 AND A HALF TO 2 TABLETS BY MOUTH EVERY DAY AS DIRECTED BY COUMADIN CLINIC 180 tablet 1   No current facility-administered medications for this visit.   Allergies: Allergies  Allergen Reactions   Triple Antibiotic [Bacitracin-Neomycin-Polymyxin] Anaphylaxis   Levaquin [Levofloxacin] Other (See Comments)    Stomach upset   Oxycodone     Tremors and brain fog   Capsaicin Rash   Elastic Bandages & [Zinc] Rash   Nickel Itching and Rash   I reviewed her past medical history, social history, family history, and environmental history and no significant changes have been reported from previous visits.  Review of Systems  Objective:  Physical exam not obtained as encounter was done via telephone.   Previous notes and tests were reviewed.  I discussed the assessment and treatment plan with the patient. The patient was provided an opportunity to ask questions and all were answered. The patient agreed with the plan and demonstrated an understanding of the instructions.  The patient was advised to call back or seek an in-person evaluation if the symptoms worsen or if the condition fails to improve as anticipated.  I provided 16 minutes of non-face-to-face time during this encounter.  Alesia Morin, MD [facility]

## 2022-11-16 NOTE — Progress Notes (Signed)
Lab orders entered

## 2022-11-17 ENCOUNTER — Inpatient Hospital Stay: Payer: Medicare Other | Attending: Hematology

## 2022-11-17 ENCOUNTER — Ambulatory Visit (HOSPITAL_COMMUNITY)
Admission: RE | Admit: 2022-11-17 | Discharge: 2022-11-17 | Disposition: A | Discharge status: Home / Self Care | Payer: Medicare Other | Source: Ambulatory Visit | Attending: Hematology | Admitting: Hematology

## 2022-11-17 DIAGNOSIS — Z9049 Acquired absence of other specified parts of digestive tract: Secondary | ICD-10-CM | POA: Diagnosis not present

## 2022-11-17 DIAGNOSIS — C180 Malignant neoplasm of cecum: Secondary | ICD-10-CM | POA: Insufficient documentation

## 2022-11-17 DIAGNOSIS — Z905 Acquired absence of kidney: Secondary | ICD-10-CM | POA: Diagnosis not present

## 2022-11-17 DIAGNOSIS — Z85038 Personal history of other malignant neoplasm of large intestine: Secondary | ICD-10-CM | POA: Diagnosis not present

## 2022-11-17 DIAGNOSIS — Z85528 Personal history of other malignant neoplasm of kidney: Secondary | ICD-10-CM | POA: Insufficient documentation

## 2022-11-17 DIAGNOSIS — Z8542 Personal history of malignant neoplasm of other parts of uterus: Secondary | ICD-10-CM | POA: Diagnosis not present

## 2022-11-17 DIAGNOSIS — K5730 Diverticulosis of large intestine without perforation or abscess without bleeding: Secondary | ICD-10-CM | POA: Diagnosis not present

## 2022-11-17 LAB — CBC WITH DIFFERENTIAL/PLATELET
Abs Immature Granulocytes: 0.02 10*3/uL (ref 0.00–0.07)
Basophils Absolute: 0 10*3/uL (ref 0.0–0.1)
Basophils Relative: 0 %
Eosinophils Absolute: 0.5 10*3/uL (ref 0.0–0.5)
Eosinophils Relative: 11 %
HCT: 43 % (ref 36.0–46.0)
Hemoglobin: 13.9 g/dL (ref 12.0–15.0)
Immature Granulocytes: 0 %
Lymphocytes Relative: 28 %
Lymphs Abs: 1.3 10*3/uL (ref 0.7–4.0)
MCH: 31.2 pg (ref 26.0–34.0)
MCHC: 32.3 g/dL (ref 30.0–36.0)
MCV: 96.4 fL (ref 80.0–100.0)
Monocytes Absolute: 0.5 10*3/uL (ref 0.1–1.0)
Monocytes Relative: 10 %
Neutro Abs: 2.3 10*3/uL (ref 1.7–7.7)
Neutrophils Relative %: 51 %
Platelets: 191 10*3/uL (ref 150–400)
RBC: 4.46 MIL/uL (ref 3.87–5.11)
RDW: 13.5 % (ref 11.5–15.5)
WBC: 4.7 10*3/uL (ref 4.0–10.5)
nRBC: 0 % (ref 0.0–0.2)

## 2022-11-17 LAB — COMPREHENSIVE METABOLIC PANEL
ALT: 32 U/L (ref 0–44)
AST: 21 U/L (ref 15–41)
Albumin: 4 g/dL (ref 3.5–5.0)
Alkaline Phosphatase: 60 U/L (ref 38–126)
Anion gap: 8 (ref 5–15)
BUN: 12 mg/dL (ref 8–23)
CO2: 28 mmol/L (ref 22–32)
Calcium: 9.1 mg/dL (ref 8.9–10.3)
Chloride: 106 mmol/L (ref 98–111)
Creatinine, Ser: 0.84 mg/dL (ref 0.44–1.00)
GFR, Estimated: >60 mL/min (ref >60)
Glucose, Bld: 107 mg/dL — ABNORMAL HIGH (ref 70–99)
Potassium: 3.9 mmol/L (ref 3.5–5.1)
Sodium: 142 mmol/L (ref 135–145)
Total Bilirubin: 0.7 mg/dL (ref 0.3–1.2)
Total Protein: 7.2 g/dL (ref 6.5–8.1)

## 2022-11-17 MED ORDER — GADOBUTROL 1 MMOL/ML IV SOLN
7.0000 mL | Freq: Once | INTRAVENOUS | Status: AC | PRN
  Administered 2022-11-17: 7 mL via INTRAVENOUS

## 2022-11-18 ENCOUNTER — Ambulatory Visit: Payer: Medicare Other | Admitting: Hematology

## 2022-11-18 LAB — CEA: CEA: 1.3 ng/mL (ref 0.0–4.7)

## 2022-11-24 ENCOUNTER — Inpatient Hospital Stay: Payer: Medicare Other | Admitting: Hematology

## 2022-11-24 ENCOUNTER — Inpatient Hospital Stay: Payer: Medicare Other | Attending: Hematology | Admitting: Hematology

## 2022-11-24 DIAGNOSIS — C180 Malignant neoplasm of cecum: Secondary | ICD-10-CM | POA: Diagnosis not present

## 2022-11-24 DIAGNOSIS — C642 Malignant neoplasm of left kidney, except renal pelvis: Secondary | ICD-10-CM

## 2022-11-24 NOTE — Progress Notes (Signed)
Virtual Visit via Telephone Note  I connected with Melissa Velazquez on 11/24/22 at  4:00 PM EST by telephone and verified that I am speaking with the correct person using two identifiers.  Location: Patient: At home Provider: At office   I discussed the limitations, risks, security and privacy concerns of performing an evaluation and management service by telephone and the availability of in person appointments. I also discussed with the patient that there may be a patient responsible charge related to this service. The patient expressed understanding and agreed to proceed.   History of Present Illness: Melissa Velazquez is a 72 y.o. female being called for MRI results and CEA level. She is diagnosed with Stage I (T1 N0 M0) cecal adenocarcinoma, MMR preserved and is under surveillance. CEA is within normal range. MRI of the abdomen and pelvis found: no evidence of  suspicious soft tissue or contrast enhancement to suggest local recurrence; no evidence of lymphadenopathy or metastatic disease in the abdomen or pelvis; and descending and sigmoid diverticulosis without evidence of acute diverticulitis.   Observations/Objective: She denies any significant changes in bowel habits.  She recently moved to [facility] about 1 week ago.  No bleeding per rectum or melena reported.  No hematuria.  No new onset pains.  Assessment and Plan:  1.  Stage I (T1 N0 M0) cecal adenocarcinoma, MMR preserved: - We reviewed her labs from 11/17/2022: Normal LFTs and CBC.  CEA was normal at 1.3. - MRI of the abdomen and pelvis from 11/17/2022: No evidence of recurrence or metastatic disease.  Other benign findings were discussed. - Recommend repeating scan and labs in 6 months.  I have given the option of referring to a local oncologist in [place].  Patient would like to continue with [place].  2.  Left kidney clear-cell renal cell carcinoma: - We reviewed MRI of the abdomen and pelvis with and without contrast from  11/17/2022: Unchanged post ablation appearance of the inferior pole of the left kidney without any evidence of local recurrence.  No lymphadenopathy or metastatic disease. - Plan to repeat scan in 6 months.   Follow Up Instructions: RTC 6 months with labs and scan.   I discussed the assessment and treatment plan with the patient. The patient was provided an opportunity to ask questions and all were answered. The patient agreed with the plan and demonstrated an understanding of the instructions.   The patient was advised to call back or seek an in-person evaluation if the symptoms worsen or if the condition fails to improve as anticipated.  I provided 21 minutes of non-face-to-face time during this encounter.   Alben Deeds Teague,acting as a Neurosurgeon for Doreatha Massed, MD.,have documented all relevant documentation on the behalf of Doreatha Massed, MD,as directed by  Doreatha Massed, MD while in the presence of Doreatha Massed, MD.  I, Doreatha Massed MD, have reviewed the above documentation for accuracy and completeness, and I agree with the above.   Doreatha Massed, MD

## 2022-11-25 ENCOUNTER — Other Ambulatory Visit: Payer: Self-pay | Admitting: *Deleted

## 2022-11-25 DIAGNOSIS — C642 Malignant neoplasm of left kidney, except renal pelvis: Secondary | ICD-10-CM

## 2022-11-25 DIAGNOSIS — C180 Malignant neoplasm of cecum: Secondary | ICD-10-CM

## 2022-12-03 ENCOUNTER — Ambulatory Visit: Payer: Medicare Other | Admitting: Cardiology

## 2022-12-08 ENCOUNTER — Ambulatory Visit: Payer: Medicare Other | Attending: Cardiology | Admitting: *Deleted

## 2022-12-08 DIAGNOSIS — Z5181 Encounter for therapeutic drug level monitoring: Secondary | ICD-10-CM

## 2022-12-08 DIAGNOSIS — I480 Paroxysmal atrial fibrillation: Secondary | ICD-10-CM | POA: Diagnosis not present

## 2022-12-08 LAB — POCT INR: INR: 2 (ref 2.0–3.0)

## 2022-12-08 NOTE — Patient Instructions (Signed)
Continue warfarin to 1 1/2 tablets daily except 2 tablets on Mondays and Thursdays Recheck in 6 wks

## 2022-12-14 DIAGNOSIS — M1990 Unspecified osteoarthritis, unspecified site: Secondary | ICD-10-CM | POA: Diagnosis not present

## 2022-12-14 DIAGNOSIS — F339 Major depressive disorder, recurrent, unspecified: Secondary | ICD-10-CM | POA: Diagnosis not present

## 2022-12-14 DIAGNOSIS — C180 Malignant neoplasm of cecum: Secondary | ICD-10-CM | POA: Diagnosis not present

## 2022-12-14 DIAGNOSIS — J3081 Allergic rhinitis due to animal (cat) (dog) hair and dander: Secondary | ICD-10-CM | POA: Diagnosis not present

## 2022-12-14 DIAGNOSIS — E039 Hypothyroidism, unspecified: Secondary | ICD-10-CM | POA: Diagnosis not present

## 2022-12-14 DIAGNOSIS — C642 Malignant neoplasm of left kidney, except renal pelvis: Secondary | ICD-10-CM | POA: Diagnosis not present

## 2022-12-14 DIAGNOSIS — I480 Paroxysmal atrial fibrillation: Secondary | ICD-10-CM | POA: Diagnosis not present

## 2022-12-14 DIAGNOSIS — E6609 Other obesity due to excess calories: Secondary | ICD-10-CM | POA: Diagnosis not present

## 2022-12-14 DIAGNOSIS — Z6834 Body mass index (BMI) 34.0-34.9, adult: Secondary | ICD-10-CM | POA: Diagnosis not present

## 2022-12-14 DIAGNOSIS — Z7689 Persons encountering health services in other specified circumstances: Secondary | ICD-10-CM | POA: Diagnosis not present

## 2022-12-14 DIAGNOSIS — E66811 Obesity, class 1: Secondary | ICD-10-CM | POA: Diagnosis not present

## 2023-01-08 DIAGNOSIS — M1990 Unspecified osteoarthritis, unspecified site: Secondary | ICD-10-CM | POA: Diagnosis not present

## 2023-01-15 DIAGNOSIS — M1990 Unspecified osteoarthritis, unspecified site: Secondary | ICD-10-CM | POA: Diagnosis not present

## 2023-01-22 ENCOUNTER — Ambulatory Visit: Payer: Medicare Other | Attending: Cardiology

## 2023-01-22 DIAGNOSIS — M1990 Unspecified osteoarthritis, unspecified site: Secondary | ICD-10-CM | POA: Diagnosis not present

## 2023-01-25 DIAGNOSIS — Z6835 Body mass index (BMI) 35.0-35.9, adult: Secondary | ICD-10-CM | POA: Diagnosis not present

## 2023-01-25 DIAGNOSIS — E039 Hypothyroidism, unspecified: Secondary | ICD-10-CM | POA: Diagnosis not present

## 2023-01-25 DIAGNOSIS — Z780 Asymptomatic menopausal state: Secondary | ICD-10-CM | POA: Diagnosis not present

## 2023-01-25 DIAGNOSIS — I480 Paroxysmal atrial fibrillation: Secondary | ICD-10-CM | POA: Diagnosis not present

## 2023-01-25 DIAGNOSIS — Z0000 Encounter for general adult medical examination without abnormal findings: Secondary | ICD-10-CM | POA: Diagnosis not present

## 2023-01-25 DIAGNOSIS — Z1231 Encounter for screening mammogram for malignant neoplasm of breast: Secondary | ICD-10-CM | POA: Diagnosis not present

## 2023-01-25 DIAGNOSIS — E6609 Other obesity due to excess calories: Secondary | ICD-10-CM | POA: Diagnosis not present

## 2023-01-25 DIAGNOSIS — E66812 Obesity, class 2: Secondary | ICD-10-CM | POA: Diagnosis not present

## 2023-01-25 DIAGNOSIS — Z1159 Encounter for screening for other viral diseases: Secondary | ICD-10-CM | POA: Diagnosis not present

## 2023-01-25 DIAGNOSIS — H9193 Unspecified hearing loss, bilateral: Secondary | ICD-10-CM | POA: Diagnosis not present

## 2023-01-25 DIAGNOSIS — Z23 Encounter for immunization: Secondary | ICD-10-CM | POA: Diagnosis not present

## 2023-01-25 DIAGNOSIS — Z13220 Encounter for screening for lipoid disorders: Secondary | ICD-10-CM | POA: Diagnosis not present

## 2023-01-29 DIAGNOSIS — M1990 Unspecified osteoarthritis, unspecified site: Secondary | ICD-10-CM | POA: Diagnosis not present

## 2023-02-11 DIAGNOSIS — H903 Sensorineural hearing loss, bilateral: Secondary | ICD-10-CM | POA: Diagnosis not present

## 2023-02-12 DIAGNOSIS — M1990 Unspecified osteoarthritis, unspecified site: Secondary | ICD-10-CM | POA: Diagnosis not present

## 2023-02-25 ENCOUNTER — Ambulatory Visit: Payer: Medicare Other | Attending: Cardiology | Admitting: Cardiology

## 2023-02-25 ENCOUNTER — Encounter: Payer: Self-pay | Admitting: Cardiology

## 2023-02-25 VITALS — BP 120/72 | HR 79 | Ht 64.0 in | Wt 226.8 lb

## 2023-02-25 DIAGNOSIS — D6869 Other thrombophilia: Secondary | ICD-10-CM | POA: Diagnosis not present

## 2023-02-25 DIAGNOSIS — I480 Paroxysmal atrial fibrillation: Secondary | ICD-10-CM | POA: Insufficient documentation

## 2023-02-25 NOTE — Patient Instructions (Signed)
 Medication Instructions:  Your physician recommends that you continue on your current medications as directed. Please refer to the Current Medication list given to you today.  *If you need a refill on your cardiac medications before your next appointment, please call your pharmacy*   Lab Work: None If you have labs (blood work) drawn today and your tests are completely normal, you will receive your results only by: MyChart Message (if you have MyChart) OR A paper copy in the mail If you have any lab test that is abnormal or we need to change your treatment, we will call you to review the results.   Testing/Procedures: Your physician has recommended that you wear an event monitor. Event monitors are medical devices that record the heart's electrical activity. Doctors most often us  these monitors to diagnose arrhythmias. Arrhythmias are problems with the speed or rhythm of the heartbeat. The monitor is a small, portable device. You can wear one while you do your normal daily activities. This is usually used to diagnose what is causing palpitations/syncope (passing out).    Follow-Up: At [facility], you and your health needs are our priority.  As part of our continuing mission to provide you with exceptional heart care, we have created designated Provider Care Teams.  These Care Teams include your primary Cardiologist (physician) and Advanced Practice Providers (APPs -  Physician Assistants and Nurse Practitioners) who all work together to provide you with the care you need, when you need it.  We recommend signing up for the patient portal called MyChart.  Sign up information is provided on this After Visit Summary.  MyChart is used to connect with patients for Virtual Visits (Telemedicine).  Patients are able to view lab/test results, encounter notes, upcoming appointments, etc.  Non-urgent messages can be sent to your provider as well.   To learn more about what you can do with  MyChart, go to [email or web address].    Your next appointment:   6 month(s)  Provider:   You may see Alvan Carrier, MD or one of the following Advanced Practice Providers on your designated Care Team:   Laymon Qua, PA-C  Olivia Pavy, [place]     Other Instructions

## 2023-02-25 NOTE — Progress Notes (Signed)
 Clinical Summary Melissa Velazquez is a 73 y.o.female seen today for follow up of the following medical problems.    1. PAF - She underwent bilateral total knee replacements in 01/2020.  Her post op course was c/b AFib w RVR. Jan 29 2020 EKG review confirms afib - wanted to change from NOAC to coumadin  due to cost.        - evaluated by Dr Cindie for [facility], has nickel allergy and thus not candidate.  - occasional palpitations, just a few seconds.  - compliant with coumadin    2.Kidney cancer - renal cell carcinoma - followed by Dr Cam with urology.            Works as warden/ranger, has phD. Does both virtual and face to face visits  Just moved to [facility]    Past Medical History:  Diagnosis Date   Allergic rhinitis    Anal fissure    Hx of    Anxiety    hx of   Arthritis    Asthma    none in last year seasonal   Chronic kidney disease    partial nephrectomy   Depression    hx of   Diverticular disease    Dysrhythmia    a_fib   Eczema    Endometrial cancer (HCC) 2001   spread to appendix    Hemorrhoids    Hyperglycemia    Hypertension    Hypothyroidism    Kidney cancer, primary, with metastasis from kidney to other site [facility])    Obesity    Sebaceous cyst    Thyroid  disease    Thyroid  nodule    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 8 g 2   cetirizine  (ZYRTEC ) 10 MG tablet Take 1 tablet (10 mg total) by mouth daily. 30 tablet 5    cromolyn  (OPTICROM ) 4 % ophthalmic solution Use Cromolyn  eye drops 1-2 drops in each eye up to 4 times a day for itchy watery eyes. 10 mL 5   fluticasone  (FLONASE ) 50 MCG/ACT nasal spray Place 2 sprays into both nostrils daily. 16 g 5   levothyroxine  (SYNTHROID ) 75 MCG tablet Take 75 mcg by mouth daily.     metoprolol  tartrate (LOPRESSOR ) 25 MG tablet Take 0.5 tablets (12.5 mg total) by mouth as needed (for palpitations). 45 tablet 3   Polyethyl Glycol-Propyl Glycol (SYSTANE OP) Place 2-3 drops into both eyes 4 (four) times daily as needed (itching / burning).     sertraline  (ZOLOFT ) 100 MG tablet Take 150 mg by mouth daily with breakfast.     simethicone  (MYLICON) 125 MG chewable tablet Chew 125 mg by mouth every 6 (six) hours as needed for flatulence.     triamcinolone  ointment (KENALOG ) 0.1 % Apply twice daily for eczema flare ups below neck as needed. 30 g 5   warfarin (COUMADIN ) 5 MG tablet TAKE 1 AND A HALF TO 2 TABLETS BY MOUTH EVERY DAY AS  DIRECTED BY COUMADIN  CLINIC 180 tablet 1   No current facility-administered medications for this visit.     Past Surgical History:  Procedure Laterality Date   APPENDECTOMY  01/19/2006   BIOPSY  12/14/2016   Procedure: BIOPSY;  Surgeon: Golda Claudis PENNER, MD;  [facility];  Service: Endoscopy;;  colon   BIOPSY  07/25/2021   Procedure: BIOPSY;  Surgeon: Eartha Angelia Sieving, MD;  [facility];  Service: Gastroenterology;;   CHOLECYSTECTOMY  01/19/2006   COLONOSCOPY N/A 11/24/2012   Procedure: COLONOSCOPY;  Surgeon: Claudis PENNER Golda, MD;  [facility];  Service: Endoscopy;  Laterality: N/A;  830   COLONOSCOPY N/A 12/14/2016   Procedure: COLONOSCOPY;  Surgeon: Golda Claudis PENNER, MD;  [facility];  Service: Endoscopy;  Laterality: N/A;  12:00   COLONOSCOPY  07/25/2021   Bx of polyp CA +   COLONOSCOPY WITH PROPOFOL  N/A 07/25/2021   Procedure: COLONOSCOPY WITH PROPOFOL ;  Surgeon: Eartha Angelia Sieving,  MD;  [facility];  Service: Gastroenterology;  Laterality: N/A;  115 ASA 1   CYSTOSCOPY WITH STENT PLACEMENT N/A 10/09/2021   Procedure: CYSTOSCOPY WITH STENT PLACEMENT;  Surgeon: Cam Morene ORN, MD;  [facility];  Service: Urology;  Laterality: N/A;  1. Cystoscopy, retrograde pyelogram with interpretation 2. Bilateral temporary ureteral stent placement   IR RADIOLOGIST EVAL & MGMT  06/23/2021   IR RADIOLOGIST EVAL & MGMT  09/05/2021   LAPAROSCOPIC RIGHT HEMI COLECTOMY Right 10/09/2021   Procedure: LAPAROSCOPIC RIGHT HEMI COLECTOMY;  Surgeon: Teresa Lonni HERO, MD;  [facility];  Service: General;  Laterality: Right;   POLYPECTOMY  12/14/2016   Procedure: POLYPECTOMY;  Surgeon: Golda Claudis PENNER, MD;  [facility];  Service: Endoscopy;;  colon   POLYPECTOMY  07/25/2021   Procedure: POLYPECTOMY;  Surgeon: Eartha Angelia Sieving, MD;  [facility];  Service: Gastroenterology;;   RADIOLOGY WITH ANESTHESIA N/A 08/06/2021   Procedure: IR WITH ANESTHESIA CRYOABLATION;  Surgeon: Luverne Aran, MD;  [facility];  Service: Radiology;  Laterality: N/A;   ROBOTIC ASSITED PARTIAL NEPHRECTOMY Left 08/21/2016   Procedure: XI ROBOTIC ASSITED PARTIAL NEPHRECTOMY;  Surgeon: Cam Morene ORN, MD;  [facility];  Service: Urology;  Laterality: Left;   TOTAL ABDOMINAL HYSTERECTOMY  01/20/1999   TOTAL KNEE ARTHROPLASTY Bilateral 01/24/2020   Procedure: TOTAL KNEE BILATERAL;  Surgeon: Melodi Lerner, MD;  [facility];  Service: Orthopedics;  Laterality: Bilateral;   WISDOM TOOTH EXTRACTION       Allergies  Allergen Reactions   Triple Antibiotic [Bacitracin-Neomycin-Polymyxin] Anaphylaxis   Levaquin [Levofloxacin] Other (See Comments)    Stomach upset   Oxycodone      Tremors and brain fog   Capsaicin Rash   Elastic Bandages & [Zinc] Rash   Nickel Itching and Rash      Family History  Problem Relation Age of Onset   Breast cancer  Mother    Diabetes Mother    Obesity Sister    Asthma Sister    Eczema Sister    COPD Father        smoked   Prostate cancer Father    Breast cancer Maternal Aunt    Urticaria Maternal Grandfather    Colon cancer Neg Hx      Social History Melissa Velazquez reports that she quit smoking about 25 years ago. Her smoking use included cigarettes. She started smoking about 45 years ago. She has a 20 pack-year smoking history.  She has been exposed to tobacco smoke. She has never used smokeless tobacco. Melissa Velazquez reports current alcohol  use.     Physical Examination Today's Vitals   02/25/23 1144  BP: 120/72  Pulse: 79  SpO2: 98%  Weight: 226 lb 12.8 oz (102.9 kg)  Height: 5' 4 (1.626 m)   Body mass index is 38.93 kg/m.  Gen: resting comfortably, no acute distress HEENT: no scleral icterus, pupils equal round and reactive, no palptable cervical adenopathy,  CV: RRR, no m/rg, no jvd Resp: Clear to auscultation bilaterally GI: abdomen is soft, non-tender, non-distended, normal bowel sounds, no hepatosplenomegaly MSK: extremities are warm, no edema.  Skin: warm, no rash Neuro:  no focal deficits Psych: appropriate affect   Diagnostic Studies  Jan 2022 echo   IMPRESSIONS     1. Left ventricular ejection fraction, by estimation, is 60 to 65%. The  left ventricle has normal function. The left ventricle has no regional  wall motion abnormalities. There is mild concentric left ventricular  hypertrophy and moderate basal septal  hypertrophy.   2. Right ventricular systolic function is mildly reduced. The right  ventricular size is normal. There is moderately elevated pulmonary artery  systolic pressure. The estimated right ventricular systolic pressure is  48.2 mmHg.   3. Large pleural effusion in the left lateral region.   4. The mitral valve is normal in structure. No evidence of mitral valve  regurgitation. No evidence of mitral stenosis.   5. The aortic valve is  tricuspid. Aortic valve regurgitation is not  visualized. Mild aortic valve sclerosis is present, with no evidence of  aortic valve stenosis. Aortic valve area, by VTI measures 2.41 cm. Aortic  valve mean gradient measures 4.0 mmHg.   Aortic valve Vmax measures 1.51 m/s.   6. The inferior vena cava is dilated in size with <50% respiratory  variability, suggesting right atrial pressure of 15 mmHg.   7. Tricuspid valve regurgitation is mild to moderate.   Assessment and Plan  1. PAF/acquired thrombophilia - NOACs were expensive, has transitioned to coumadin . - we have not seen a clear reoccurence since her initital episode in the postop period. May have been isolated afib - obtain 30 day monitor, if no recurrent afib would d/c coumadin . - EKG today shows ectopci atrial rhythm  F/u  6months     Dorn PHEBE Ross, M.D.

## 2023-02-26 DIAGNOSIS — Z1231 Encounter for screening mammogram for malignant neoplasm of breast: Secondary | ICD-10-CM | POA: Diagnosis not present

## 2023-02-26 DIAGNOSIS — Z780 Asymptomatic menopausal state: Secondary | ICD-10-CM | POA: Diagnosis not present

## 2023-03-01 DIAGNOSIS — Z5181 Encounter for therapeutic drug level monitoring: Secondary | ICD-10-CM | POA: Diagnosis not present

## 2023-03-01 DIAGNOSIS — I480 Paroxysmal atrial fibrillation: Secondary | ICD-10-CM | POA: Diagnosis not present

## 2023-03-01 DIAGNOSIS — Z7901 Long term (current) use of anticoagulants: Secondary | ICD-10-CM | POA: Diagnosis not present

## 2023-03-10 DIAGNOSIS — I4891 Unspecified atrial fibrillation: Secondary | ICD-10-CM | POA: Diagnosis not present

## 2023-03-11 ENCOUNTER — Ambulatory Visit: Payer: Medicare Other | Attending: Cardiology

## 2023-03-11 DIAGNOSIS — I480 Paroxysmal atrial fibrillation: Secondary | ICD-10-CM

## 2023-03-27 ENCOUNTER — Encounter: Payer: Self-pay | Admitting: Cardiology

## 2023-04-26 DIAGNOSIS — I4891 Unspecified atrial fibrillation: Secondary | ICD-10-CM | POA: Diagnosis not present

## 2023-05-03 DIAGNOSIS — Z7901 Long term (current) use of anticoagulants: Secondary | ICD-10-CM | POA: Diagnosis not present

## 2023-05-03 DIAGNOSIS — I480 Paroxysmal atrial fibrillation: Secondary | ICD-10-CM | POA: Diagnosis not present

## 2023-05-03 DIAGNOSIS — Z5181 Encounter for therapeutic drug level monitoring: Secondary | ICD-10-CM | POA: Diagnosis not present

## 2023-05-12 DIAGNOSIS — E66812 Obesity, class 2: Secondary | ICD-10-CM | POA: Diagnosis not present

## 2023-05-12 DIAGNOSIS — E6609 Other obesity due to excess calories: Secondary | ICD-10-CM | POA: Diagnosis not present

## 2023-05-12 DIAGNOSIS — Z6835 Body mass index (BMI) 35.0-35.9, adult: Secondary | ICD-10-CM | POA: Diagnosis not present

## 2023-05-12 DIAGNOSIS — R5383 Other fatigue: Secondary | ICD-10-CM | POA: Diagnosis not present

## 2023-05-17 ENCOUNTER — Ambulatory Visit: Payer: Self-pay | Admitting: *Deleted

## 2023-05-17 DIAGNOSIS — C189 Malignant neoplasm of colon, unspecified: Secondary | ICD-10-CM | POA: Diagnosis not present

## 2023-05-19 DIAGNOSIS — Z23 Encounter for immunization: Secondary | ICD-10-CM | POA: Diagnosis not present

## 2023-05-25 ENCOUNTER — Ambulatory Visit (HOSPITAL_COMMUNITY): Payer: Medicare Other

## 2023-05-25 ENCOUNTER — Inpatient Hospital Stay: Payer: Medicare Other

## 2023-05-25 DIAGNOSIS — C180 Malignant neoplasm of cecum: Secondary | ICD-10-CM | POA: Diagnosis not present

## 2023-05-25 DIAGNOSIS — C642 Malignant neoplasm of left kidney, except renal pelvis: Secondary | ICD-10-CM | POA: Diagnosis not present

## 2023-05-31 DIAGNOSIS — Z7901 Long term (current) use of anticoagulants: Secondary | ICD-10-CM | POA: Diagnosis not present

## 2023-06-01 ENCOUNTER — Inpatient Hospital Stay: Payer: Medicare Other | Admitting: Hematology

## 2023-06-11 DIAGNOSIS — C642 Malignant neoplasm of left kidney, except renal pelvis: Secondary | ICD-10-CM | POA: Diagnosis not present

## 2023-06-11 DIAGNOSIS — Z905 Acquired absence of kidney: Secondary | ICD-10-CM | POA: Diagnosis not present

## 2023-06-11 DIAGNOSIS — R918 Other nonspecific abnormal finding of lung field: Secondary | ICD-10-CM | POA: Diagnosis not present

## 2023-06-24 ENCOUNTER — Ambulatory Visit: Payer: Self-pay | Admitting: Cardiology

## 2023-06-25 NOTE — Telephone Encounter (Signed)
 Pt returning call to nurse fro results

## 2023-06-28 DIAGNOSIS — Z5181 Encounter for therapeutic drug level monitoring: Secondary | ICD-10-CM | POA: Diagnosis not present

## 2023-06-28 DIAGNOSIS — I480 Paroxysmal atrial fibrillation: Secondary | ICD-10-CM | POA: Diagnosis not present

## 2023-06-28 DIAGNOSIS — Z7901 Long term (current) use of anticoagulants: Secondary | ICD-10-CM | POA: Diagnosis not present

## 2023-07-09 ENCOUNTER — Other Ambulatory Visit: Payer: Self-pay | Admitting: Family Medicine

## 2023-07-09 DIAGNOSIS — E66812 Obesity, class 2: Secondary | ICD-10-CM | POA: Diagnosis not present

## 2023-07-09 DIAGNOSIS — M25551 Pain in right hip: Secondary | ICD-10-CM | POA: Diagnosis not present

## 2023-07-09 DIAGNOSIS — M25552 Pain in left hip: Secondary | ICD-10-CM | POA: Diagnosis not present

## 2023-07-09 DIAGNOSIS — E6609 Other obesity due to excess calories: Secondary | ICD-10-CM | POA: Diagnosis not present

## 2023-07-09 DIAGNOSIS — G8929 Other chronic pain: Secondary | ICD-10-CM | POA: Diagnosis not present

## 2023-07-09 DIAGNOSIS — J3081 Allergic rhinitis due to animal (cat) (dog) hair and dander: Secondary | ICD-10-CM | POA: Diagnosis not present

## 2023-07-09 DIAGNOSIS — Z6835 Body mass index (BMI) 35.0-35.9, adult: Secondary | ICD-10-CM | POA: Diagnosis not present

## 2023-07-15 DIAGNOSIS — G8929 Other chronic pain: Secondary | ICD-10-CM | POA: Diagnosis not present

## 2023-07-15 DIAGNOSIS — M7062 Trochanteric bursitis, left hip: Secondary | ICD-10-CM | POA: Diagnosis not present

## 2023-07-15 DIAGNOSIS — M25551 Pain in right hip: Secondary | ICD-10-CM | POA: Diagnosis not present

## 2023-07-15 DIAGNOSIS — M7061 Trochanteric bursitis, right hip: Secondary | ICD-10-CM | POA: Diagnosis not present

## 2023-07-15 DIAGNOSIS — M25552 Pain in left hip: Secondary | ICD-10-CM | POA: Diagnosis not present

## 2023-07-19 DIAGNOSIS — Z5181 Encounter for therapeutic drug level monitoring: Secondary | ICD-10-CM | POA: Diagnosis not present

## 2023-07-19 DIAGNOSIS — I480 Paroxysmal atrial fibrillation: Secondary | ICD-10-CM | POA: Diagnosis not present

## 2023-07-19 DIAGNOSIS — Z7901 Long term (current) use of anticoagulants: Secondary | ICD-10-CM | POA: Diagnosis not present

## 2023-08-16 DIAGNOSIS — I480 Paroxysmal atrial fibrillation: Secondary | ICD-10-CM | POA: Diagnosis not present

## 2023-08-16 DIAGNOSIS — Z5181 Encounter for therapeutic drug level monitoring: Secondary | ICD-10-CM | POA: Diagnosis not present

## 2023-08-16 DIAGNOSIS — Z7901 Long term (current) use of anticoagulants: Secondary | ICD-10-CM | POA: Diagnosis not present

## 2023-09-03 ENCOUNTER — Ambulatory Visit: Attending: Cardiology | Admitting: Cardiology

## 2023-09-03 ENCOUNTER — Encounter: Payer: Self-pay | Admitting: Cardiology

## 2023-09-03 VITALS — BP 122/80 | HR 66 | Ht 64.0 in | Wt 218.8 lb

## 2023-09-03 DIAGNOSIS — D6869 Other thrombophilia: Secondary | ICD-10-CM | POA: Diagnosis not present

## 2023-09-03 DIAGNOSIS — I480 Paroxysmal atrial fibrillation: Secondary | ICD-10-CM | POA: Insufficient documentation

## 2023-09-03 NOTE — Progress Notes (Signed)
 Clinical Summary Melissa Velazquez is a 73 y.o.female seen today for follow up of the following medical problems.    1. PAF - She underwent bilateral total knee replacements in 01/2020.  Her post op course was c/b AFib w RVR. Jan 29 2020 EKG review confirms afib - wanted to change from NOAC to coumadin  due to cost.      - evaluated by Melissa Velazquez for [facility], has nickel allergy and thus not candidate.  - occasional palpitations, just a few seconds.  - compliant with coumadin  02/2023 30 day monitor: rare ctopy, isolated 4 beat run NSVT    2.Kidney cancer - renal cell carcinoma - followed by Melissa Velazquez with urology.            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 part time  [facility] Past Medical History:  Diagnosis Date   Allergic rhinitis    Anal fissure    Hx of    Anxiety    hx of   Arthritis    Asthma    none in last year seasonal   Chronic kidney disease    partial nephrectomy   Depression    hx of   Diverticular disease    Dysrhythmia    a_fib   Eczema    Endometrial cancer (HCC) 2001   spread to appendix    Hemorrhoids    Hyperglycemia    Hypertension    Hypothyroidism    Kidney cancer, primary, with metastasis from kidney to other site [facility])    Obesity    Sebaceous cyst    Thyroid  disease    Thyroid  nodule    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 8 g 2   cetirizine  (ZYRTEC ) 10  MG tablet Take 1 tablet (10 mg total) by mouth daily. 30 tablet 5   cromolyn  (OPTICROM ) 4 % ophthalmic solution IF OLOPATADINE  DOES NOT IMPROVE YOUR SYMPTOMS, CONSIDER CROMOLYN  EYE DROPS 1-2 DROPS IN EACH EYE UP TO 4 TIMES A DAY 10 mL 3   fluticasone  (FLONASE ) 50 MCG/ACT nasal spray Place 2 sprays into both nostrils daily. 16 g 5   levothyroxine  (SYNTHROID ) 75 MCG tablet Take 75 mcg by mouth daily.     metoprolol  tartrate (LOPRESSOR ) 25 MG tablet Take 0.5 tablets (12.5 mg total) by mouth as needed (for palpitations). 45 tablet 3   Polyethyl Glycol-Propyl Glycol (SYSTANE OP) Place 2-3 drops into both eyes 4 (four) times daily as needed (itching / burning).     sertraline  (ZOLOFT ) 100 MG tablet Take 150 mg by mouth daily with breakfast.     simethicone  (MYLICON) 125 MG chewable tablet Chew 125 mg by mouth every 6 (six) hours as needed for flatulence.     triamcinolone  ointment (KENALOG ) 0.1 % Apply twice daily for eczema flare ups below neck as needed. 30 g 5   warfarin (COUMADIN ) 5 MG  tablet TAKE 1 AND A HALF TO 2 TABLETS BY MOUTH EVERY DAY AS DIRECTED BY COUMADIN  CLINIC 180 tablet 1   No current facility-administered medications for this visit.     Past Surgical History:  Procedure Laterality Date   APPENDECTOMY  01/19/2006   BIOPSY  12/14/2016   Procedure: BIOPSY;  Surgeon: Golda Claudis PENNER, MD;  [facility];  Service: Endoscopy;;  colon   BIOPSY  07/25/2021   Procedure: BIOPSY;  Surgeon: Eartha Angelia Sieving, MD;  [facility];  Service: Gastroenterology;;   CHOLECYSTECTOMY  01/19/2006   COLONOSCOPY N/A 11/24/2012   Procedure: COLONOSCOPY;  Surgeon: Claudis PENNER Golda, MD;  [facility];  Service: Endoscopy;  Laterality: N/A;  830   COLONOSCOPY N/A 12/14/2016   Procedure: COLONOSCOPY;  Surgeon: Golda Claudis PENNER, MD;  [facility];  Service: Endoscopy;  Laterality: N/A;  12:00   COLONOSCOPY  07/25/2021   Bx of polyp CA +   COLONOSCOPY WITH  PROPOFOL  N/A 07/25/2021   Procedure: COLONOSCOPY WITH PROPOFOL ;  Surgeon: Eartha Angelia Sieving, MD;  [facility];  Service: Gastroenterology;  Laterality: N/A;  115 ASA 1   CYSTOSCOPY WITH STENT PLACEMENT N/A 10/09/2021   Procedure: CYSTOSCOPY WITH STENT PLACEMENT;  Surgeon: [facility] Morene ORN, MD;  [facility];  Service: Urology;  Laterality: N/A;  1. Cystoscopy, retrograde pyelogram with interpretation 2. Bilateral temporary ureteral stent placement   IR RADIOLOGIST EVAL & MGMT  06/23/2021   IR RADIOLOGIST EVAL & MGMT  09/05/2021   LAPAROSCOPIC RIGHT HEMI COLECTOMY Right 10/09/2021   Procedure: LAPAROSCOPIC RIGHT HEMI COLECTOMY;  Surgeon: Teresa Lonni HERO, MD;  [facility];  Service: General;  Laterality: Right;   POLYPECTOMY  12/14/2016   Procedure: POLYPECTOMY;  Surgeon: Golda Claudis PENNER, MD;  [facility];  Service: Endoscopy;;  colon   POLYPECTOMY  07/25/2021   Procedure: POLYPECTOMY;  Surgeon: Eartha Angelia Sieving, MD;  [facility];  Service: Gastroenterology;;   RADIOLOGY WITH ANESTHESIA N/A 08/06/2021   Procedure: IR WITH ANESTHESIA CRYOABLATION;  Surgeon: Luverne Aran, MD;  [facility];  Service: Radiology;  Laterality: N/A;   ROBOTIC ASSITED PARTIAL NEPHRECTOMY Left 08/21/2016   Procedure: XI ROBOTIC ASSITED PARTIAL NEPHRECTOMY;  Surgeon: [facility] Morene ORN, MD;  [facility];  Service: Urology;  Laterality: Left;   TOTAL ABDOMINAL HYSTERECTOMY  01/20/1999   TOTAL KNEE ARTHROPLASTY Bilateral 01/24/2020   Procedure: TOTAL KNEE BILATERAL;  Surgeon: Melodi Lerner, MD;  [facility];  Service: Orthopedics;  Laterality: Bilateral;   WISDOM TOOTH EXTRACTION       Allergies  Allergen Reactions   Triple Antibiotic [Bacitracin-Neomycin-Polymyxin] Anaphylaxis   Levaquin [Levofloxacin] Other (See Comments)    Stomach upset   Oxycodone      Tremors and brain fog   Capsaicin Rash   Elastic Bandages & [Zinc] Rash    Nickel Itching and Rash      Family History  Problem Relation Age of Onset   Cancer Mother        Breast   Diabetes Mother    COPD Father        smoked   Prostate cancer Father    Obesity Sister    Asthma Sister    Eczema Sister    Urticaria Maternal Grandfather    Breast cancer Maternal Aunt    Colon cancer Neg Hx      Social History Ms. Maske reports that she quit smoking about 25 years  ago. Her smoking use included cigarettes. She started smoking about 45 years ago. She has a 20 pack-year smoking history. She has been exposed to tobacco smoke. She has never used smokeless tobacco. Ms. Lenoir reports current alcohol  use.     Physical Examination Today's Vitals   09/03/23 1138  BP: 122/80  Pulse: 66  SpO2: 96%  Weight: 218 lb 12.8 oz (99.2 kg)  Height: 5' 4 (1.626 m)   Body mass index is 37.56 kg/m.  Gen: resting comfortably, no acute distress HEENT: no scleral icterus, pupils equal round and reactive, no palptable cervical adenopathy,  CV: RRR, no m/rg, no jvd Resp: Clear to auscultation bilaterally GI: abdomen is soft, non-tender, non-distended, normal bowel sounds, no hepatosplenomegaly MSK: extremities are warm, no edema.  Skin: warm, no rash Neuro:  no focal deficits Psych: appropriate affect   Diagnostic Studies  Jan 2022 echo   IMPRESSIONS     1. Left ventricular ejection fraction, by estimation, is 60 to 65%. The  left ventricle has normal function. The left ventricle has no regional  wall motion abnormalities. There is mild concentric left ventricular  hypertrophy and moderate basal septal  hypertrophy.   2. Right ventricular systolic function is mildly reduced. The right  ventricular size is normal. There is moderately elevated pulmonary artery  systolic pressure. The estimated right ventricular systolic pressure is  48.2 mmHg.   3. Large pleural effusion in the left lateral region.   4. The mitral valve is normal in structure. No  evidence of mitral valve  regurgitation. No evidence of mitral stenosis.   5. The aortic valve is tricuspid. Aortic valve regurgitation is not  visualized. Mild aortic valve sclerosis is present, with no evidence of  aortic valve stenosis. Aortic valve area, by VTI measures 2.41 cm. Aortic  valve mean gradient measures 4.0 mmHg.   Aortic valve Vmax measures 1.51 m/s.   6. The inferior vena cava is dilated in size with <50% respiratory  variability, suggesting right atrial pressure of 15 mmHg.   7. Tricuspid valve regurgitation is mild to moderate.   02/2023 monitor 30 day monitor   Rare supraventricular ectopy   Rare ventricular ectopy. Isolated 4 second run of NSVT.   No symptoms reported   Min HR 49, Max HR 129, avg HR 66  Assessment and Plan   1. PAF/acquired thrombophilia - NOACs were expensive, has transitioned to coumadin . - we have not seen a clear reoccurence since her initital episode in the postop period from clinical evaluations/EKGs/and recent 30 day monitor. Appears to have been isolated postop afib.  -we will d/c coumadin  at this time   F/u 6 months     Melissa Velazquez, M.D.

## 2023-09-03 NOTE — Patient Instructions (Signed)
 Medication Instructions:   Stop Warfarin (Coumadin ) Continue all other medications.     Labwork:  none  Testing/Procedures:  none  Follow-Up:  6 months   Any Other Special Instructions Will Be Listed Below (If Applicable).   If you need a refill on your cardiac medications before your next appointment, please call your pharmacy.

## 2023-09-03 NOTE — Addendum Note (Signed)
 Addended by: Carter Kassel G on: 09/03/2023 12:10 PM   Modules accepted: Orders

## 2023-09-04 ENCOUNTER — Other Ambulatory Visit: Payer: Self-pay | Admitting: Cardiology

## 2023-10-10 DIAGNOSIS — H6121 Impacted cerumen, right ear: Secondary | ICD-10-CM | POA: Diagnosis not present

## 2023-10-19 DIAGNOSIS — Z23 Encounter for immunization: Secondary | ICD-10-CM | POA: Diagnosis not present

## 2023-11-18 DIAGNOSIS — C642 Malignant neoplasm of left kidney, except renal pelvis: Secondary | ICD-10-CM | POA: Diagnosis not present

## 2023-11-18 DIAGNOSIS — Z85528 Personal history of other malignant neoplasm of kidney: Secondary | ICD-10-CM | POA: Diagnosis not present

## 2023-11-18 DIAGNOSIS — Z905 Acquired absence of kidney: Secondary | ICD-10-CM | POA: Diagnosis not present

## 2023-11-18 DIAGNOSIS — R918 Other nonspecific abnormal finding of lung field: Secondary | ICD-10-CM | POA: Diagnosis not present

## 2023-11-18 DIAGNOSIS — Z08 Encounter for follow-up examination after completed treatment for malignant neoplasm: Secondary | ICD-10-CM | POA: Diagnosis not present

## 2023-11-23 DIAGNOSIS — C180 Malignant neoplasm of cecum: Secondary | ICD-10-CM | POA: Diagnosis not present

## 2023-12-03 DIAGNOSIS — M9905 Segmental and somatic dysfunction of pelvic region: Secondary | ICD-10-CM | POA: Diagnosis not present

## 2023-12-03 DIAGNOSIS — M50321 Other cervical disc degeneration at C4-C5 level: Secondary | ICD-10-CM | POA: Diagnosis not present

## 2023-12-03 DIAGNOSIS — M5432 Sciatica, left side: Secondary | ICD-10-CM | POA: Diagnosis not present

## 2023-12-03 DIAGNOSIS — M9903 Segmental and somatic dysfunction of lumbar region: Secondary | ICD-10-CM | POA: Diagnosis not present

## 2023-12-03 DIAGNOSIS — M9901 Segmental and somatic dysfunction of cervical region: Secondary | ICD-10-CM | POA: Diagnosis not present

## 2023-12-06 DIAGNOSIS — M5432 Sciatica, left side: Secondary | ICD-10-CM | POA: Diagnosis not present

## 2023-12-06 DIAGNOSIS — M9905 Segmental and somatic dysfunction of pelvic region: Secondary | ICD-10-CM | POA: Diagnosis not present

## 2023-12-06 DIAGNOSIS — M50321 Other cervical disc degeneration at C4-C5 level: Secondary | ICD-10-CM | POA: Diagnosis not present

## 2023-12-06 DIAGNOSIS — M9903 Segmental and somatic dysfunction of lumbar region: Secondary | ICD-10-CM | POA: Diagnosis not present

## 2023-12-06 DIAGNOSIS — M9901 Segmental and somatic dysfunction of cervical region: Secondary | ICD-10-CM | POA: Diagnosis not present

## 2023-12-08 DIAGNOSIS — M9901 Segmental and somatic dysfunction of cervical region: Secondary | ICD-10-CM | POA: Diagnosis not present

## 2023-12-08 DIAGNOSIS — M9903 Segmental and somatic dysfunction of lumbar region: Secondary | ICD-10-CM | POA: Diagnosis not present

## 2023-12-08 DIAGNOSIS — M50321 Other cervical disc degeneration at C4-C5 level: Secondary | ICD-10-CM | POA: Diagnosis not present

## 2023-12-08 DIAGNOSIS — M5432 Sciatica, left side: Secondary | ICD-10-CM | POA: Diagnosis not present

## 2023-12-08 DIAGNOSIS — M9905 Segmental and somatic dysfunction of pelvic region: Secondary | ICD-10-CM | POA: Diagnosis not present

## 2023-12-10 DIAGNOSIS — M5432 Sciatica, left side: Secondary | ICD-10-CM | POA: Diagnosis not present

## 2023-12-10 DIAGNOSIS — M50321 Other cervical disc degeneration at C4-C5 level: Secondary | ICD-10-CM | POA: Diagnosis not present

## 2023-12-10 DIAGNOSIS — M9903 Segmental and somatic dysfunction of lumbar region: Secondary | ICD-10-CM | POA: Diagnosis not present

## 2023-12-10 DIAGNOSIS — M9901 Segmental and somatic dysfunction of cervical region: Secondary | ICD-10-CM | POA: Diagnosis not present

## 2023-12-10 DIAGNOSIS — M9905 Segmental and somatic dysfunction of pelvic region: Secondary | ICD-10-CM | POA: Diagnosis not present

## 2023-12-13 DIAGNOSIS — M9903 Segmental and somatic dysfunction of lumbar region: Secondary | ICD-10-CM | POA: Diagnosis not present

## 2023-12-13 DIAGNOSIS — M5432 Sciatica, left side: Secondary | ICD-10-CM | POA: Diagnosis not present

## 2023-12-13 DIAGNOSIS — M9905 Segmental and somatic dysfunction of pelvic region: Secondary | ICD-10-CM | POA: Diagnosis not present

## 2023-12-13 DIAGNOSIS — M9901 Segmental and somatic dysfunction of cervical region: Secondary | ICD-10-CM | POA: Diagnosis not present

## 2023-12-13 DIAGNOSIS — M50321 Other cervical disc degeneration at C4-C5 level: Secondary | ICD-10-CM | POA: Diagnosis not present
